# Patient Record
Sex: Male | Born: 1935
Health system: Southern US, Community
[De-identification: ages and names within clinical notes are randomized; demographics above are authoritative.]

## PROBLEM LIST (undated history)

## (undated) DIAGNOSIS — M810 Age-related osteoporosis without current pathological fracture: Secondary | ICD-10-CM

## (undated) DIAGNOSIS — E785 Hyperlipidemia, unspecified: Secondary | ICD-10-CM

## (undated) DIAGNOSIS — I10 Essential (primary) hypertension: Secondary | ICD-10-CM

## (undated) DIAGNOSIS — F0280 Dementia in other diseases classified elsewhere without behavioral disturbance: Secondary | ICD-10-CM

## (undated) DIAGNOSIS — Z860100 Personal history of colon polyps, unspecified: Secondary | ICD-10-CM

## (undated) DIAGNOSIS — E669 Obesity, unspecified: Secondary | ICD-10-CM

## (undated) DIAGNOSIS — M069 Rheumatoid arthritis, unspecified: Secondary | ICD-10-CM

## (undated) DIAGNOSIS — I429 Cardiomyopathy, unspecified: Secondary | ICD-10-CM

## (undated) DIAGNOSIS — G4730 Sleep apnea, unspecified: Secondary | ICD-10-CM

## (undated) DIAGNOSIS — J449 Chronic obstructive pulmonary disease, unspecified: Secondary | ICD-10-CM

## (undated) DIAGNOSIS — G309 Alzheimer's disease, unspecified: Secondary | ICD-10-CM

## (undated) DIAGNOSIS — M1990 Unspecified osteoarthritis, unspecified site: Secondary | ICD-10-CM

## (undated) DIAGNOSIS — C61 Malignant neoplasm of prostate: Secondary | ICD-10-CM

## (undated) DIAGNOSIS — M353 Polymyalgia rheumatica: Secondary | ICD-10-CM

## (undated) DIAGNOSIS — K219 Gastro-esophageal reflux disease without esophagitis: Secondary | ICD-10-CM

## (undated) DIAGNOSIS — G219 Secondary parkinsonism, unspecified: Secondary | ICD-10-CM

## (undated) DIAGNOSIS — I209 Angina pectoris, unspecified: Secondary | ICD-10-CM

## (undated) HISTORY — DX: Cardiomyopathy, unspecified: I42.9

## (undated) HISTORY — PX: CORONARY ANGIOPLASTY: SHX604

## (undated) HISTORY — DX: Unspecified osteoarthritis, unspecified site: M19.90

## (undated) HISTORY — DX: Personal history of colon polyps, unspecified: Z86.0100

## (undated) HISTORY — PX: HEMORRHOIDECTOMY WITH HEMORRHOID BANDING: SHX5633

## (undated) HISTORY — DX: Gastro-esophageal reflux disease without esophagitis: K21.9

## (undated) HISTORY — DX: Angina pectoris, unspecified: I20.9

## (undated) HISTORY — DX: Dementia in other diseases classified elsewhere without behavioral disturbance: F02.80

## (undated) HISTORY — PX: STRABISMUS SURGERY: SHX218

## (undated) HISTORY — DX: Hyperlipidemia, unspecified: E78.5

## (undated) HISTORY — DX: Polymyalgia rheumatica: M35.3

## (undated) HISTORY — DX: Personal history of colonic polyps: Z86.010

## (undated) HISTORY — PX: CATARACT EXTRACTION: SUR2

## (undated) HISTORY — DX: Sleep apnea, unspecified: G47.30

## (undated) HISTORY — DX: Malignant neoplasm of prostate: C61

## (undated) HISTORY — DX: Essential (primary) hypertension: I10

## (undated) HISTORY — PX: TONSILLECTOMY: SUR1361

## (undated) HISTORY — PX: PROSTATECTOMY: SHX69

## (undated) HISTORY — DX: Age-related osteoporosis without current pathological fracture: M81.0

## (undated) HISTORY — DX: Alzheimer's disease, unspecified: G30.9

## (undated) HISTORY — DX: Chronic obstructive pulmonary disease, unspecified: J44.9

## (undated) HISTORY — DX: Rheumatoid arthritis, unspecified: M06.9

## (undated) HISTORY — DX: Obesity, unspecified: E66.9

---
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
## 2004-11-28 ENCOUNTER — Inpatient Hospital Stay: Payer: Self-pay | Admitting: Anesthesiology

## 2004-11-28 ENCOUNTER — Other Ambulatory Visit: Payer: Self-pay

## 2004-11-29 ENCOUNTER — Other Ambulatory Visit: Payer: Self-pay

## 2004-12-19 ENCOUNTER — Encounter: Payer: Self-pay | Admitting: Internal Medicine

## 2005-01-01 ENCOUNTER — Ambulatory Visit: Payer: Self-pay

## 2005-01-13 ENCOUNTER — Encounter: Payer: Self-pay | Admitting: Internal Medicine

## 2005-10-06 ENCOUNTER — Emergency Department (HOSPITAL_COMMUNITY): Admission: EM | Admit: 2005-10-06 | Discharge: 2005-10-06 | Payer: Self-pay | Admitting: *Deleted

## 2006-12-13 ENCOUNTER — Ambulatory Visit: Payer: Self-pay | Admitting: Unknown Physician Specialty

## 2012-12-12 ENCOUNTER — Ambulatory Visit: Payer: Self-pay | Admitting: Specialist

## 2014-01-22 DIAGNOSIS — I209 Angina pectoris, unspecified: Secondary | ICD-10-CM | POA: Insufficient documentation

## 2014-01-22 DIAGNOSIS — K219 Gastro-esophageal reflux disease without esophagitis: Secondary | ICD-10-CM | POA: Insufficient documentation

## 2014-03-21 ENCOUNTER — Observation Stay: Payer: Self-pay | Admitting: Internal Medicine

## 2014-03-21 LAB — CK TOTAL AND CKMB (NOT AT ARMC)
CK, Total: 180 U/L
CK-MB: 2.7 ng/mL (ref 0.5–3.6)

## 2014-03-21 LAB — BASIC METABOLIC PANEL
Anion Gap: 5 — ABNORMAL LOW (ref 7–16)
BUN: 21 mg/dL — ABNORMAL HIGH (ref 7–18)
Calcium, Total: 8.3 mg/dL — ABNORMAL LOW (ref 8.5–10.1)
Chloride: 107 mmol/L (ref 98–107)
Co2: 28 mmol/L (ref 21–32)
Creatinine: 1.56 mg/dL — ABNORMAL HIGH (ref 0.60–1.30)
EGFR (African American): 47 — ABNORMAL LOW
EGFR (Non-African Amer.): 40 — ABNORMAL LOW
Glucose: 99 mg/dL (ref 65–99)
Osmolality: 282 (ref 275–301)
Potassium: 3.8 mmol/L (ref 3.5–5.1)
Sodium: 140 mmol/L (ref 136–145)

## 2014-03-21 LAB — CBC
HCT: 32.5 % — ABNORMAL LOW (ref 40.0–52.0)
HGB: 10.6 g/dL — ABNORMAL LOW (ref 13.0–18.0)
MCH: 30 pg (ref 26.0–34.0)
MCHC: 32.6 g/dL (ref 32.0–36.0)
MCV: 92 fL (ref 80–100)
Platelet: 176 10*3/uL (ref 150–440)
RBC: 3.53 10*6/uL — ABNORMAL LOW (ref 4.40–5.90)
RDW: 13.6 % (ref 11.5–14.5)
WBC: 7.5 10*3/uL (ref 3.8–10.6)

## 2014-03-21 LAB — TROPONIN I: Troponin-I: <0.02 ng/mL

## 2014-03-22 LAB — CBC WITH DIFFERENTIAL/PLATELET
Basophil #: 0.1 10*3/uL (ref 0.0–0.1)
Basophil %: 1 %
Eosinophil #: 0.2 10*3/uL (ref 0.0–0.7)
Eosinophil %: 3.5 %
HCT: 31 % — ABNORMAL LOW (ref 40.0–52.0)
HGB: 10 g/dL — ABNORMAL LOW (ref 13.0–18.0)
Lymphocyte #: 1.4 10*3/uL (ref 1.0–3.6)
Lymphocyte %: 21.1 %
MCH: 29.8 pg (ref 26.0–34.0)
MCHC: 32.2 g/dL (ref 32.0–36.0)
MCV: 92 fL (ref 80–100)
Monocyte #: 0.5 x10 3/mm (ref 0.2–1.0)
Monocyte %: 7.8 %
Neutrophil #: 4.4 10*3/uL (ref 1.4–6.5)
Neutrophil %: 66.6 %
Platelet: 152 10*3/uL (ref 150–440)
RBC: 3.36 10*6/uL — ABNORMAL LOW (ref 4.40–5.90)
RDW: 13.7 % (ref 11.5–14.5)
WBC: 6.6 10*3/uL (ref 3.8–10.6)

## 2014-03-22 LAB — BASIC METABOLIC PANEL
Anion Gap: 5 — ABNORMAL LOW (ref 7–16)
BUN: 24 mg/dL — ABNORMAL HIGH (ref 7–18)
Calcium, Total: 7.9 mg/dL — ABNORMAL LOW (ref 8.5–10.1)
Chloride: 109 mmol/L — ABNORMAL HIGH (ref 98–107)
Co2: 29 mmol/L (ref 21–32)
Creatinine: 1.67 mg/dL — ABNORMAL HIGH (ref 0.60–1.30)
EGFR (African American): 43 — ABNORMAL LOW
EGFR (Non-African Amer.): 37 — ABNORMAL LOW
Glucose: 87 mg/dL (ref 65–99)
Osmolality: 288 (ref 275–301)
Potassium: 3.8 mmol/L (ref 3.5–5.1)
Sodium: 143 mmol/L (ref 136–145)

## 2014-03-22 LAB — LIPID PANEL
Cholesterol: 114 mg/dL (ref 0–200)
HDL Cholesterol: 38 mg/dL — ABNORMAL LOW (ref 40–60)
Ldl Cholesterol, Calc: 61 mg/dL (ref 0–100)
Triglycerides: 76 mg/dL (ref 0–200)
VLDL Cholesterol, Calc: 15 mg/dL (ref 5–40)

## 2014-03-22 LAB — CK TOTAL AND CKMB (NOT AT ARMC)
CK, Total: 134 U/L
CK, Total: 163 U/L
CK-MB: 2.2 ng/mL (ref 0.5–3.6)
CK-MB: 2.8 ng/mL (ref 0.5–3.6)

## 2014-03-22 LAB — TROPONIN I
Troponin-I: <0.02 ng/mL
Troponin-I: <0.02 ng/mL

## 2014-03-22 LAB — MAGNESIUM: Magnesium: 2.1 mg/dL

## 2014-03-22 LAB — TSH: Thyroid Stimulating Horm: 4.69 u[IU]/mL — ABNORMAL HIGH

## 2014-04-09 ENCOUNTER — Emergency Department: Payer: Self-pay | Admitting: Emergency Medicine

## 2014-04-09 LAB — CBC
HCT: 36.2 % — ABNORMAL LOW (ref 40.0–52.0)
HGB: 12 g/dL — ABNORMAL LOW (ref 13.0–18.0)
MCH: 30.1 pg (ref 26.0–34.0)
MCHC: 33.1 g/dL (ref 32.0–36.0)
MCV: 91 fL (ref 80–100)
Platelet: 207 10*3/uL (ref 150–440)
RBC: 3.98 10*6/uL — ABNORMAL LOW (ref 4.40–5.90)
RDW: 13.5 % (ref 11.5–14.5)
WBC: 12.4 10*3/uL — ABNORMAL HIGH (ref 3.8–10.6)

## 2014-04-09 LAB — COMPREHENSIVE METABOLIC PANEL
Albumin: 3.5 g/dL (ref 3.4–5.0)
Alkaline Phosphatase: 62 U/L
Anion Gap: 6 — ABNORMAL LOW (ref 7–16)
BUN: 30 mg/dL — ABNORMAL HIGH (ref 7–18)
Bilirubin,Total: 0.3 mg/dL (ref 0.2–1.0)
Calcium, Total: 8.6 mg/dL (ref 8.5–10.1)
Chloride: 112 mmol/L — ABNORMAL HIGH (ref 98–107)
Co2: 27 mmol/L (ref 21–32)
Creatinine: 1.61 mg/dL — ABNORMAL HIGH (ref 0.60–1.30)
EGFR (African American): 45 — ABNORMAL LOW
EGFR (Non-African Amer.): 39 — ABNORMAL LOW
Glucose: 117 mg/dL — ABNORMAL HIGH (ref 65–99)
Osmolality: 296 (ref 275–301)
Potassium: 4.2 mmol/L (ref 3.5–5.1)
SGOT(AST): 34 U/L (ref 15–37)
SGPT (ALT): 35 U/L (ref 12–78)
Sodium: 145 mmol/L (ref 136–145)
Total Protein: 6.8 g/dL (ref 6.4–8.2)

## 2014-04-09 LAB — TROPONIN I: Troponin-I: <0.02 ng/mL

## 2015-02-05 NOTE — Discharge Summary (Signed)
PATIENT NAME:  Devin Vance, Devin Vance MR#:  [id number] DATE OF BIRTH:  03-Apr-1928  DATE OF ADMISSION:  03/21/2014 DATE OF DISCHARGE:  03/22/2014  DISCHARGE DIAGNOSES: 1.  Chest pain, unlikely cardiac with negative cardiac enzymes are negative Myoview, likely stress related. Can also be due to gastroesophageal reflux disease.  2.  Arrhythmia (ventricular bigeminy). Reduce her dose of Toprol, per cardiology.   SECONDARY DIAGNOSES: 1.  Coronary artery disease, status post stent.  2.  Chronic pedal edema.  3.  Hyperlipidemia.  4.  Hypertension.  5.  Osteoporosis.    CONSULTATION: Cardiology, Dr. Serafina Royals.   PROCEDURES AND RADIOLOGY: Myoview on June 8 was negative.   Chest x-ray on admission was negative for any acute disease.   HISTORY AND SHORT HOSPITAL COURSE: The patient is an 81 year old male with the above-mentioned medical problems, who was admitted for chest pain. Please see Dr. Serita Grit dictated history and physical for further details. The patient was ruled out with 3 negative sets of troponin and underwent Myoview which was negative. Cardiology consultation was obtained with Dr. Nehemiah Massed, who recommended discharge home with outpatient follow-up with the patient's family doctor and cardiologist. The patient was also found to have abnormal TSH, was borderline elevated, although he did not have any significant symptoms. He was requested to have a repeat TSH in a couple of weeks as an outpatient with his family doctor, and/or follow up with endocrine physician. On the date of discharge, he was feeling much better, was agreeable to discharge planning. On the date of discharge, his vital signs were as follows: Temperature 97.5, heart rate 60 per minute, respirations 18 per minute, blood pressure 123/51 mmHg. He was saturating 98% on room air.   PERTINENT PHYSICAL EXAMINATION ON THE DATE OF DISCHARGE:  CARDIOVASCULAR: S1, S2 normal. No murmurs, rubs or gallop.  LUNGS: Clear to auscultation  bilaterally. No wheezing, rales, rhonchi, or crepitation.  ABDOMEN: Soft, benign.  NEUROLOGIC: Nonfocal examination.  All other physical examination remained at baseline.   DISCHARGE MEDICATIONS: 1.  Crestor 40 mg p.o. daily.  2.  Cosamin 1 capsule p.o. daily.  3.  Vitamin D3 1000 international units once daily.  4.  Ramipril 5 mg p.o. daily.  5.  Isosorbide mononitrate 30 mg p.o. daily as needed.  6.  Lasix 20 mg p.o. daily as needed.  7.  Triazolam 0.25 mg p.o. daily. 8.  Omeprazole 20 mg p.o. daily.  9.  Lutein 20 mg p.o. daily.  10.  Fish oil 1000 mg p.o. daily.  11.  Aspirin 81 mg p.o. daily.  12.  B complex once daily.  13.  Multivitamin once daily.  14.  Caltrate with vitamin D 1 tablet p.o. daily.  15.  2 liters oxygen via nasal cannula at bedtime.  16.  Metoprolol ER 25 mg p.o. daily.    DISCHARGE DIET: Regular.   DISCHARGE ACTIVITY: As tolerated.   DISCHARGE INSTRUCTIONS AND FOLLOW-UP: The patient was instructed to follow up with Dr. Serafina Royals in 1 to 2 weeks. She will need follow-up with Dr. Calla Kicks in 2 to 4 weeks. She was requested to have her TSH checked when following up with Dr. Calla Kicks. Consider follow up with endocrine physician if her function remains abnormal. There was no restriction to resume physical therapy, considering this admission to the hospital.   [place] DISCHARGING THIS PATIENT: 45 minutes.     ____________________________ Devin Vance. Manuella Ghazi, MD vss:cg D: 03/22/2014 21:31:46 ET T: 03/23/2014 02:49:07 ET JOB#: [id number]  cc: Zaviyar Rahal S. Manuella Ghazi, MD, <Dictator> Dory Horn. Eliberto Ivory, MD Corey Skains, MD Devin Vance [facility] MD ELECTRONICALLY SIGNED 03/24/2014 20:09

## 2015-02-05 NOTE — Consult Note (Signed)
PATIENT NAME:  Devin Vance, Devin Vance MR#:  [id number] DATE OF BIRTH:  1927-12-22  DATE OF CONSULTATION:  03/22/2014  REFERRING PHYSICIAN:   CONSULTING PHYSICIAN:  Corey Skains, MD  REASON FOR CONSULTATION: Chest discomfort, coronary artery disease, gastroesophageal reflux with bradycardia and DVT.   CHIEF COMPLAINT: "I'm short of breath."   HISTORY OF PRESENT ILLNESS: This is an 81 year old male with known cardiovascular disease in the past with episodes of bradycardia, weakness and fatigue. The patient has had issues with some chest pain and shortness of breath with and without physical activity, waxing and waning but more atypical at this time with no apparent evidence of myocardial infarction at this time. EKG shows ventricular bigeminy, otherwise no evidence of acute myocardial infarction. He does have gastroesophageal reflux, on appropriate medications, possibly contributing to current symptoms.  The patient also has coronary artery disease but no evidence of myocardial infarction in the past.   REVIEW OF SYSTEMS: The remainder of review of systems is negative for vision change, ringing in the ears, hearing loss, cough, congestion, heartburn, nausea, vomiting, diarrhea, bloody stools, stomach pain, extremity pain, leg weakness, cramping of the buttocks, known blood clots, headaches, blackouts, dizzy spells, nosebleeds, congestion, trouble swallowing, frequent urination, urination at night, muscle weakness, numbness, anxiety, depression, skin lesions or skin rashes.   PAST MEDICAL HISTORY: 1.  Gastroesophageal reflux.  2.  Premature ventricular contractions.   3.  Coronary artery disease.   FAMILY HISTORY: A few family members with early onset of cardiovascular disease.   SOCIAL HISTORY: Currently denies alcohol or tobacco use..   ALLERGIES: AS LISTED.   MEDICATIONS: As listed.   PHYSICAL EXAMINATION: VITAL SIGNS: Blood pressure is 136/68 bilaterally. Heart rate is 50 upright, reclining  and elevated consistent with preventricular contractions and ventricular bigeminy.  HEAD, EYES, EARS, NOSE AND THROAT: No icterus, thyromegaly, ulcers, hemorrhage or xanthelasma.  CARDIOVASCULAR: Irregularly irregular with normal S1 and S2. No apparent murmur, gallop or rub. PMI is diffuse. Carotid upstroke normal without bruit. Jugular venous pressure is normal.  LUNGS: Clear to auscultation with normal respirations.  ABDOMEN: Soft, nontender, without hepatosplenomegaly or mass. Abdominal aorta is normal size without bruit.  EXTREMITIES: Show 2+ radial, femoral, dorsal pedal pulses with no lower extremity edema, cyanosis, clubbing or ulcers.  NEUROLOGIC: He is oriented to time, place and person, with normal mood and affect.   ASSESSMENT: An 81 year old male with coronary artery disease, gastroesophageal reflux, bradycardia likely secondary to ventricular bigeminy with no current evidence of myocardial infarction or congestive heart failure.   RECOMMENDATIONS: 1.  Decrease beta blocker due to bradycardia and ventricular bigeminy.  2.  Consider echocardiogram for LV systolic dysfunction.  3.  Serial ECG and enzymes to assess myocardial infarction and consider treadmill EKG and/or stress test for further evaluation of chest pain.  4.  Further diagnostic testing and treatment options after above.    ____________________________ Corey Skains, MD bjk:cs D: 03/22/2014 19:08:00 ET T: 03/22/2014 20:52:43 ET JOB#: [id number]  cc: Corey Skains, MD, <Dictator> Corey Skains MD ELECTRONICALLY SIGNED 03/23/2014 14:45

## 2015-02-05 NOTE — H&P (Signed)
PATIENT NAME:  Devin Vance, Devin Vance MR#:  [id number] DATE OF BIRTH:  11/27/1927  DATE OF ADMISSION:  03/21/2014  PRIMARY CARE PROVIDER: Dr. Calla Kicks.   REFERRING DOCTOR: Dr. Jacqualine Code.   CHIEF COMPLAINT: Chest pain.   HISTORY OF PRESENT ILLNESS: The patient is an 81 year old white male who has a history of coronary artery disease with a stent placed about 10 years ago, presents with complaint of left-sided chest pain. He states that he took a nap earlier and then woke up and was reading the newspaper when he started having sharp left-sided chest pain. The patient reports that previous episode of this has been a long time ago. He is under a lot of stress due to a move. He did not have any shortness of breath. He reports that he had cramping of both of his hands 2 days ago. He otherwise denies any chest pain on exertion. No dyspnea on exertion. He has chronic lower extremity swelling. Denies any fevers, chills, nausea, vomiting or diarrhea.   PAST MEDICAL HISTORY: Significant for:  1. Coronary artery disease with a single stent.  2. Chronic pedal edema.  3. Hyperlipidemia.  4. Hypertension.  5. Osteoporosis.   ALLERGIES: None.   MEDICATIONS: He is on Crestor 40 daily, ramipril 5 daily, isosorbide mononitrate 30 daily, Lasix 20 daily, Toprol-XL 50 daily, lutein 20 mg daily, fish oil 1000 mg daily, aspirin 81 one tab p.o. daily, B complex 1 tab p.o. daily, multivitamin daily, calcium plus vitamin D 1 tab p.o. daily, Halcion 0.25 daily, omeprazole 20 daily.   SOCIAL HISTORY: Does not smoke. Does not drink. No drugs.   FAMILY HISTORY: Positive for hypertension.   REVIEW OF SYSTEMS:   CONSTITUTIONAL: Denies any fevers, fatigue, weakness. Complains of chest pain. No weight loss. No weight gain.  EYES: No blurred or double vision. No pain. No redness. No inflammation. No glaucoma. No cataracts.  ENT: No tinnitus. No ear pain. No hearing loss. No seasonal or year-round allergies. No difficulty swallowing.   RESPIRATORY: Denies any cough, wheezing, hemoptysis.  CARDIOVASCULAR: Complains of left-sided chest pain. No orthopnea. Has chronic edema.  GASTROINTESTINAL: No nausea, vomiting, diarrhea. No abdominal pain. No hematemesis. No melena.  GENITOURINARY: Denies any dysuria, hematuria, renal calculus or frequency.  ENDOCRINE: Denies any polyuria, nocturia, thyroid problems.  HEMATOLOGIC AND LYMPHATIC: Denies anemia, easy bruisability or bleeding.  SKIN: No acne. No rash.  MUSCULOSKELETAL: Denies any pain in the neck, back or shoulder.  NEUROLOGIC: No numbness, CVA, TIA.   PSYCHIATRIC: No anxiety, insomnia or ADD.   PHYSICAL EXAMINATION:  VITAL SIGNS: Temperature 98.2, pulse 44, respirations 18, blood pressure 142/62, O2 is 94%.  GENERAL: The patient is a well-developed, well-nourished male in no acute distress.  HEENT: Head atraumatic, normocephalic. Pupils equally round, reactive to light and accommodation. There is no conjunctival pallor. No scleral icterus. Nasal exam shows no drainage or ulceration. Oropharynx is clear without any exudate.  NECK: Supple without any JVD.  CARDIOVASCULAR: Regular rate and rhythm. No rubs or clicks. He has a systolic murmur heard at the right sternal border. S1, S2 positive. No gallops.  LUNGS: Clear to auscultation bilaterally without any rales, rhonchi or wheezing.  ABDOMEN: Soft, nontender, nondistended. Positive bowel sounds x 4. Edema 1+.  SKIN: No rash.  LYMPHATICS: No lymph nodes palpable.  VASCULAR: Good DP, PT pulses.  PSYCHIATRIC: Not anxious or depressed.  NEUROLOGIC: Awake, alert, oriented x 3. No focal deficits.   EKG shows bigeminy. No ST-T wave changes.  LABS: Glucose 99, BUN 21, creatinine 1.56, sodium 140, potassium was 3.8, chloride 107, CO2 28, calcium 8.3. Troponin less than 0.02. WBC 7.5, hemoglobin 10.6, platelet count 176. Chest x-ray shows no acute cardiopulmonary processes.   ASSESSMENT AND PLAN: The patient is an 81 year old  white male with history of coronary artery disease, hypertension, hyperlipidemia, presents with chest pain.  1. Chest pain: Will place him under observation. Do serial enzymes. Continue aspirin and metoprolol. Cardiology evaluation  in the morning.  2. Hypertension: Continue Toprol, isosorbide mononitrate and ramipril.  3. Gastroesophageal reflux disease: Will continue omeprazole.  4. Hyperlipidemia: Will continue Crestor. Check a fasting lipid panel in the a.m.    TIME SPENT ON THIS PATIENT: 45 minutes.   ____________________________ Lafonda Mosses Posey Pronto, MD shp:gb D: 03/21/2014 21:37:35 ET T: 03/21/2014 23:54:25 ET JOB#: [id number]  cc: Tiani Stanbery H. Posey Pronto, MD, <Dictator> Alric Seton MD ELECTRONICALLY SIGNED 03/30/2014 16:51

## 2015-05-18 DIAGNOSIS — H5032 Intermittent alternating esotropia: Secondary | ICD-10-CM | POA: Insufficient documentation

## 2015-10-26 DIAGNOSIS — M40202 Unspecified kyphosis, cervical region: Secondary | ICD-10-CM | POA: Insufficient documentation

## 2015-10-27 ENCOUNTER — Other Ambulatory Visit: Payer: Self-pay | Admitting: Family Medicine

## 2015-11-30 ENCOUNTER — Other Ambulatory Visit: Payer: Self-pay | Admitting: Family Medicine

## 2015-11-30 DIAGNOSIS — M4850XA Collapsed vertebra, not elsewhere classified, site unspecified, initial encounter for fracture: Secondary | ICD-10-CM

## 2015-12-15 ENCOUNTER — Ambulatory Visit
Admission: RE | Admit: 2015-12-15 | Discharge: 2015-12-15 | Disposition: A | Discharge status: Home / Self Care | Payer: Medicare Other | Source: Ambulatory Visit | Attending: Family Medicine | Admitting: Family Medicine

## 2015-12-15 DIAGNOSIS — M4850XA Collapsed vertebra, not elsewhere classified, site unspecified, initial encounter for fracture: Secondary | ICD-10-CM

## 2015-12-15 DIAGNOSIS — Z13820 Encounter for screening for osteoporosis: Secondary | ICD-10-CM | POA: Insufficient documentation

## 2016-10-30 DIAGNOSIS — Z9989 Dependence on other enabling machines and devices: Secondary | ICD-10-CM | POA: Diagnosis not present

## 2016-10-30 DIAGNOSIS — G4733 Obstructive sleep apnea (adult) (pediatric): Secondary | ICD-10-CM | POA: Diagnosis not present

## 2016-11-08 ENCOUNTER — Ambulatory Visit: Payer: Medicare Other | Attending: Specialist

## 2016-11-08 DIAGNOSIS — G4733 Obstructive sleep apnea (adult) (pediatric): Secondary | ICD-10-CM | POA: Diagnosis not present

## 2016-11-12 DIAGNOSIS — R0602 Shortness of breath: Secondary | ICD-10-CM | POA: Diagnosis not present

## 2016-11-12 DIAGNOSIS — G4733 Obstructive sleep apnea (adult) (pediatric): Secondary | ICD-10-CM | POA: Diagnosis not present

## 2016-11-12 DIAGNOSIS — E669 Obesity, unspecified: Secondary | ICD-10-CM | POA: Diagnosis not present

## 2016-11-12 DIAGNOSIS — I429 Cardiomyopathy, unspecified: Secondary | ICD-10-CM | POA: Diagnosis not present

## 2016-11-12 DIAGNOSIS — I209 Angina pectoris, unspecified: Secondary | ICD-10-CM | POA: Diagnosis not present

## 2016-11-12 DIAGNOSIS — K219 Gastro-esophageal reflux disease without esophagitis: Secondary | ICD-10-CM | POA: Diagnosis not present

## 2016-11-12 DIAGNOSIS — I10 Essential (primary) hypertension: Secondary | ICD-10-CM | POA: Diagnosis not present

## 2016-11-12 DIAGNOSIS — I2510 Atherosclerotic heart disease of native coronary artery without angina pectoris: Secondary | ICD-10-CM | POA: Diagnosis not present

## 2016-11-21 ENCOUNTER — Ambulatory Visit: Payer: Medicare Other | Attending: Specialist

## 2016-11-21 DIAGNOSIS — G4733 Obstructive sleep apnea (adult) (pediatric): Secondary | ICD-10-CM | POA: Insufficient documentation

## 2016-11-21 DIAGNOSIS — G4761 Periodic limb movement disorder: Secondary | ICD-10-CM | POA: Diagnosis not present

## 2016-11-27 DIAGNOSIS — H5032 Intermittent alternating esotropia: Secondary | ICD-10-CM | POA: Diagnosis not present

## 2017-01-25 DIAGNOSIS — H4321 Crystalline deposits in vitreous body, right eye: Secondary | ICD-10-CM | POA: Diagnosis not present

## 2017-01-25 DIAGNOSIS — H5032 Intermittent alternating esotropia: Secondary | ICD-10-CM | POA: Diagnosis not present

## 2017-01-25 DIAGNOSIS — H532 Diplopia: Secondary | ICD-10-CM | POA: Diagnosis not present

## 2017-02-12 DIAGNOSIS — I10 Essential (primary) hypertension: Secondary | ICD-10-CM | POA: Diagnosis not present

## 2017-02-12 DIAGNOSIS — M7021 Olecranon bursitis, right elbow: Secondary | ICD-10-CM | POA: Diagnosis not present

## 2017-02-12 DIAGNOSIS — N528 Other male erectile dysfunction: Secondary | ICD-10-CM | POA: Diagnosis not present

## 2017-02-12 DIAGNOSIS — D649 Anemia, unspecified: Secondary | ICD-10-CM | POA: Diagnosis not present

## 2017-03-30 DIAGNOSIS — W57XXXA Bitten or stung by nonvenomous insect and other nonvenomous arthropods, initial encounter: Secondary | ICD-10-CM | POA: Diagnosis not present

## 2017-03-30 DIAGNOSIS — R040 Epistaxis: Secondary | ICD-10-CM | POA: Diagnosis not present

## 2017-04-29 DIAGNOSIS — I2510 Atherosclerotic heart disease of native coronary artery without angina pectoris: Secondary | ICD-10-CM | POA: Diagnosis not present

## 2017-04-29 DIAGNOSIS — E669 Obesity, unspecified: Secondary | ICD-10-CM | POA: Diagnosis not present

## 2017-04-29 DIAGNOSIS — G4733 Obstructive sleep apnea (adult) (pediatric): Secondary | ICD-10-CM | POA: Diagnosis not present

## 2017-04-29 DIAGNOSIS — N400 Enlarged prostate without lower urinary tract symptoms: Secondary | ICD-10-CM | POA: Diagnosis not present

## 2017-04-29 DIAGNOSIS — Z955 Presence of coronary angioplasty implant and graft: Secondary | ICD-10-CM | POA: Diagnosis not present

## 2017-04-29 DIAGNOSIS — I429 Cardiomyopathy, unspecified: Secondary | ICD-10-CM | POA: Diagnosis not present

## 2017-04-29 DIAGNOSIS — I209 Angina pectoris, unspecified: Secondary | ICD-10-CM | POA: Diagnosis not present

## 2017-04-29 DIAGNOSIS — I10 Essential (primary) hypertension: Secondary | ICD-10-CM | POA: Diagnosis not present

## 2017-04-29 DIAGNOSIS — R0602 Shortness of breath: Secondary | ICD-10-CM | POA: Diagnosis not present

## 2017-04-29 DIAGNOSIS — K219 Gastro-esophageal reflux disease without esophagitis: Secondary | ICD-10-CM | POA: Diagnosis not present

## 2017-06-06 DIAGNOSIS — R413 Other amnesia: Secondary | ICD-10-CM | POA: Diagnosis not present

## 2017-06-20 DIAGNOSIS — Z85828 Personal history of other malignant neoplasm of skin: Secondary | ICD-10-CM | POA: Diagnosis not present

## 2017-06-20 DIAGNOSIS — L821 Other seborrheic keratosis: Secondary | ICD-10-CM | POA: Diagnosis not present

## 2017-06-20 DIAGNOSIS — L218 Other seborrheic dermatitis: Secondary | ICD-10-CM | POA: Diagnosis not present

## 2017-06-20 DIAGNOSIS — D225 Melanocytic nevi of trunk: Secondary | ICD-10-CM | POA: Diagnosis not present

## 2017-07-17 DIAGNOSIS — Z9989 Dependence on other enabling machines and devices: Secondary | ICD-10-CM | POA: Diagnosis not present

## 2017-07-17 DIAGNOSIS — R498 Other voice and resonance disorders: Secondary | ICD-10-CM | POA: Diagnosis not present

## 2017-07-17 DIAGNOSIS — F0390 Unspecified dementia without behavioral disturbance: Secondary | ICD-10-CM | POA: Diagnosis not present

## 2017-07-17 DIAGNOSIS — G4733 Obstructive sleep apnea (adult) (pediatric): Secondary | ICD-10-CM | POA: Diagnosis not present

## 2017-07-17 DIAGNOSIS — Z23 Encounter for immunization: Secondary | ICD-10-CM | POA: Diagnosis not present

## 2017-08-06 DIAGNOSIS — N528 Other male erectile dysfunction: Secondary | ICD-10-CM | POA: Diagnosis not present

## 2017-08-06 DIAGNOSIS — B070 Plantar wart: Secondary | ICD-10-CM | POA: Diagnosis not present

## 2017-08-06 DIAGNOSIS — Z23 Encounter for immunization: Secondary | ICD-10-CM | POA: Diagnosis not present

## 2017-08-06 DIAGNOSIS — R351 Nocturia: Secondary | ICD-10-CM | POA: Diagnosis not present

## 2017-08-06 DIAGNOSIS — E7800 Pure hypercholesterolemia, unspecified: Secondary | ICD-10-CM | POA: Diagnosis not present

## 2017-08-06 DIAGNOSIS — I10 Essential (primary) hypertension: Secondary | ICD-10-CM | POA: Diagnosis not present

## 2017-08-06 DIAGNOSIS — R413 Other amnesia: Secondary | ICD-10-CM | POA: Diagnosis not present

## 2017-08-06 DIAGNOSIS — R7303 Prediabetes: Secondary | ICD-10-CM | POA: Diagnosis not present

## 2017-08-15 ENCOUNTER — Other Ambulatory Visit: Payer: Self-pay

## 2017-08-15 DIAGNOSIS — I10 Essential (primary) hypertension: Secondary | ICD-10-CM | POA: Insufficient documentation

## 2017-08-16 ENCOUNTER — Ambulatory Visit (INDEPENDENT_AMBULATORY_CARE_PROVIDER_SITE_OTHER): Payer: Medicare Other | Admitting: Podiatry

## 2017-08-16 DIAGNOSIS — L989 Disorder of the skin and subcutaneous tissue, unspecified: Secondary | ICD-10-CM | POA: Diagnosis not present

## 2017-08-19 NOTE — Progress Notes (Signed)
   Subjective: Patient presents to the office today for chief complaint of a painful callus lesion of the plantar aspect of the right foot that has been present for the past 3-4 months. Patient states that the pain is ongoing and is affecting their ability to ambulate without pain. He states he thinks he has a plantar wart. Walking increases the pain. He has been using corn pads for treatment. Patient presents today for further treatment and evaluation.   No past medical history on file.   Objective:  Physical Exam General: Alert and oriented x3 in no acute distress  Dermatology: Hyperkeratotic lesion present on the right foot. Pain on palpation with a central nucleated core noted.  Skin is warm, dry and supple bilateral lower extremities. Negative for open lesions or macerations.  Vascular: Palpable pedal pulses bilaterally. No edema or erythema noted. Capillary refill within normal limits.  Neurological: Epicritic and protective threshold grossly intact bilaterally.   Musculoskeletal Exam: Pain on palpation at the keratotic lesion noted. Range of motion within normal limits bilateral. Muscle strength 5/5 in all groups bilateral.  Assessment: #1 Porokeratosis right foot   Plan of Care:  #1 Patient evaluated #2 Excisional debridement of keratoic lesion using a chisel blade was performed without incident.  #3 Dressed area with light dressing. #4 Recommended Urea 40% cream daily. #5 Return to clinic when necessary.  Edrick Kins, DPM [facility]  Dr. Edrick Kins, [address]                                        [place], [place] [address]                Office [phone]  Fax [phone]

## 2017-08-31 DIAGNOSIS — Z760 Encounter for issue of repeat prescription: Secondary | ICD-10-CM | POA: Diagnosis not present

## 2017-08-31 DIAGNOSIS — M1380 Other specified arthritis, unspecified site: Secondary | ICD-10-CM | POA: Diagnosis not present

## 2017-09-03 ENCOUNTER — Other Ambulatory Visit: Payer: Self-pay | Admitting: Neurology

## 2017-09-03 DIAGNOSIS — R413 Other amnesia: Secondary | ICD-10-CM

## 2017-09-07 ENCOUNTER — Ambulatory Visit
Admission: RE | Admit: 2017-09-07 | Discharge: 2017-09-07 | Disposition: A | Discharge status: Home / Self Care | Payer: Medicare Other | Source: Ambulatory Visit | Attending: Neurology | Admitting: Neurology

## 2017-09-07 DIAGNOSIS — R413 Other amnesia: Secondary | ICD-10-CM | POA: Diagnosis not present

## 2017-09-09 DIAGNOSIS — R52 Pain, unspecified: Secondary | ICD-10-CM | POA: Diagnosis not present

## 2017-09-10 DIAGNOSIS — R498 Other voice and resonance disorders: Secondary | ICD-10-CM | POA: Diagnosis not present

## 2017-09-10 DIAGNOSIS — G4733 Obstructive sleep apnea (adult) (pediatric): Secondary | ICD-10-CM | POA: Diagnosis not present

## 2017-09-10 DIAGNOSIS — F0390 Unspecified dementia without behavioral disturbance: Secondary | ICD-10-CM | POA: Diagnosis not present

## 2017-09-10 DIAGNOSIS — E559 Vitamin D deficiency, unspecified: Secondary | ICD-10-CM | POA: Diagnosis not present

## 2017-09-10 DIAGNOSIS — Z9989 Dependence on other enabling machines and devices: Secondary | ICD-10-CM | POA: Diagnosis not present

## 2017-09-10 DIAGNOSIS — R269 Unspecified abnormalities of gait and mobility: Secondary | ICD-10-CM | POA: Diagnosis not present

## 2017-09-10 DIAGNOSIS — M353 Polymyalgia rheumatica: Secondary | ICD-10-CM | POA: Diagnosis not present

## 2017-09-19 DIAGNOSIS — Z9189 Other specified personal risk factors, not elsewhere classified: Secondary | ICD-10-CM | POA: Diagnosis not present

## 2017-09-19 DIAGNOSIS — Z23 Encounter for immunization: Secondary | ICD-10-CM | POA: Diagnosis not present

## 2017-09-19 DIAGNOSIS — M353 Polymyalgia rheumatica: Secondary | ICD-10-CM | POA: Diagnosis not present

## 2017-10-14 DIAGNOSIS — M150 Primary generalized (osteo)arthritis: Secondary | ICD-10-CM | POA: Diagnosis not present

## 2017-10-14 DIAGNOSIS — M353 Polymyalgia rheumatica: Secondary | ICD-10-CM | POA: Diagnosis not present

## 2017-10-17 ENCOUNTER — Encounter: Payer: Self-pay | Admitting: Speech Pathology

## 2017-10-17 ENCOUNTER — Ambulatory Visit: Payer: Medicare Other | Attending: Neurology | Admitting: Speech Pathology

## 2017-10-17 ENCOUNTER — Other Ambulatory Visit: Payer: Self-pay

## 2017-10-17 DIAGNOSIS — R490 Dysphonia: Secondary | ICD-10-CM | POA: Insufficient documentation

## 2017-10-17 NOTE — Therapy (Signed)
[place] [facility] SERVICES [address] [place], [place], [address] Phone: [phone]   Fax:  [phone]  Speech Language Pathology Evaluation  Patient Details  Name: Devin Vance MRN: [id number] Date of Birth: 04-26-1928 Referring Provider: Dr. Manuella Ghazi   Encounter Date: 10/17/2017  End of Session - 10/17/17 1547    Visit Number  1    Number of Visits  1    Date for SLP Re-Evaluation  10/17/17    SLP Start Time  1300    SLP Stop Time   1400    SLP Time Calculation (min)  60 min    Activity Tolerance  Patient tolerated treatment well       History reviewed. No pertinent past medical history.  History reviewed. No pertinent surgical history.  There were no vitals filed for this visit.      SLP Evaluation [facility] - 10/17/17 0001      SLP Visit Information   SLP Received On  10/17/17    Referring Provider  Dr. Manuella Ghazi    Onset Date  07/17/2017    Medical Diagnosis  hypophonia      Subjective   Subjective  Patient and wife reports soft speaking voice but no change in his ability to sing loudly and with his usual pitch range.  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    Patient/Family Stated Goal  Louder speaking voice      General Information   HPI  84 year old man with concern for parkinsonism secondary hypophonia and hunched forward posture.      Prior Functional Status   Cognitive/Linguistic Baseline  Baseline deficits    Baseline deficit details  Patient with Dx dementia      Oral Motor/Sensory Function   Overall Oral Motor/Sensory Function  Appears within functional limits for tasks assessed      Motor Speech   Overall Motor Speech  Impaired    Respiration  Within functional limits    Phonation  Low vocal intensity    Resonance  Within functional limits    Articulation  Within functional limitis    Intelligibility  Intelligible    Phonation  Impaired    Volume  Soft       Standardized Assessments   Standardized Assessments   Other Assessment LSVT-LOUD Voice Evaluation       LSVT-LOUD Voice Evaluation Maximum phonation time for sustained ah: 16 seconds Mean intensity during sustained ah: 75 dB (85 dB and clear vocal quality with cues) Mean intensity sustained during conversational speech: 66 dB ( 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  The patient exhibited values outside the norm for Shimmer and Noise to Harmonic Ratio.  Average fundamental frequency was average for age and gender.  Simulatability: Improved vocal quality with loud voice.  SLP Education - 10/17/17 1546    Education provided  Yes    Education Details  Voice Building Exercise Routine    Person(s) Educated  Patient;Spouse    Methods  Explanation;Demonstration;Verbal cues;Handout    Comprehension  Verbalized understanding;Returned demonstration           Plan - 10/17/17 1547    Clinical Impression Statement  This 84 year old man with concern for parkinsonism secondary hypophonia and hunched forward posture is presenting with moderate dysphonia characterized by soft volume and hoarse vocal quality.  The patient's wife reports that he has not had changes in his singing voice; maintaining loudness, pitch range, and vocal quality.  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  During today's session, the patient demonstrated ability to generate a loud, good quality voice for sustained vowel and reading with minimal cues.  I do not think the patient requires the intensive schedule of LSVT-LOUD.  I gave him and his wife a written exercise plan to promote loudness and awareness of loudness.    They have my card if they have any further questions or concerns regarding his voice.    Speech Therapy Frequency  One time visit    Treatment/Interventions  Patient/family education;Other (comment) Voice building    Potential to Achieve Goals  Good    Potential Considerations  Ability to learn/carryover information;Family/community support    SLP Home Exercise Plan  Voice Building Routine    Consulted and Agree with Plan of Care  Patient;Family member/caregiver    Family Member Consulted  Spouse       Patient will benefit from skilled therapeutic intervention in order to improve the following deficits and impairments:   Dysphonia - Plan: SLP plan of care cert/re-cert    Problem List Patient Active Problem List   Diagnosis Date Noted   HTN (hypertension) 08/15/2017   Kyphosis of cervical region 10/26/2015   Intermittent alternating esotropia 05/18/2015   GERD (gastroesophageal reflux disease) 01/22/2014   Angina pectoris ([place]) 01/22/2014   Leroy Sea, MS/CCC- SLP  Lou Miner 10/17/2017, 3:51 PM  [facility] [address] [place], [place], [address] Phone: [phone]   Fax:  [phone]  Name: Devin Vance MRN: [id number] Date of Birth: 14-Apr-1928

## 2017-10-21 ENCOUNTER — Encounter: Payer: Medicare Other | Admitting: Speech Pathology

## 2017-10-22 ENCOUNTER — Encounter: Payer: Medicare Other | Admitting: Speech Pathology

## 2017-10-23 ENCOUNTER — Encounter: Payer: Medicare Other | Admitting: Speech Pathology

## 2017-10-24 ENCOUNTER — Encounter: Payer: Medicare Other | Admitting: Speech Pathology

## 2017-10-28 ENCOUNTER — Encounter: Payer: Medicare Other | Admitting: Speech Pathology

## 2017-10-29 ENCOUNTER — Encounter: Payer: Medicare Other | Admitting: Speech Pathology

## 2017-10-30 ENCOUNTER — Encounter: Payer: Medicare Other | Admitting: Speech Pathology

## 2017-10-30 DIAGNOSIS — R0609 Other forms of dyspnea: Secondary | ICD-10-CM | POA: Diagnosis not present

## 2017-10-30 DIAGNOSIS — J449 Chronic obstructive pulmonary disease, unspecified: Secondary | ICD-10-CM | POA: Diagnosis not present

## 2017-10-30 DIAGNOSIS — G4733 Obstructive sleep apnea (adult) (pediatric): Secondary | ICD-10-CM | POA: Diagnosis not present

## 2017-10-31 ENCOUNTER — Encounter: Payer: Medicare Other | Admitting: Speech Pathology

## 2017-11-04 ENCOUNTER — Encounter: Payer: Medicare Other | Admitting: Speech Pathology

## 2017-11-05 ENCOUNTER — Encounter: Payer: Medicare Other | Admitting: Speech Pathology

## 2017-11-06 ENCOUNTER — Encounter: Payer: Medicare Other | Admitting: Speech Pathology

## 2017-11-07 ENCOUNTER — Encounter: Payer: Medicare Other | Admitting: Speech Pathology

## 2017-11-07 DIAGNOSIS — Z955 Presence of coronary angioplasty implant and graft: Secondary | ICD-10-CM | POA: Diagnosis not present

## 2017-11-07 DIAGNOSIS — E669 Obesity, unspecified: Secondary | ICD-10-CM | POA: Diagnosis not present

## 2017-11-07 DIAGNOSIS — R0602 Shortness of breath: Secondary | ICD-10-CM | POA: Diagnosis not present

## 2017-11-07 DIAGNOSIS — I209 Angina pectoris, unspecified: Secondary | ICD-10-CM | POA: Diagnosis not present

## 2017-11-07 DIAGNOSIS — G4733 Obstructive sleep apnea (adult) (pediatric): Secondary | ICD-10-CM | POA: Diagnosis not present

## 2017-11-07 DIAGNOSIS — K219 Gastro-esophageal reflux disease without esophagitis: Secondary | ICD-10-CM | POA: Diagnosis not present

## 2017-11-07 DIAGNOSIS — I10 Essential (primary) hypertension: Secondary | ICD-10-CM | POA: Diagnosis not present

## 2017-11-07 DIAGNOSIS — N400 Enlarged prostate without lower urinary tract symptoms: Secondary | ICD-10-CM | POA: Diagnosis not present

## 2017-11-07 DIAGNOSIS — I2510 Atherosclerotic heart disease of native coronary artery without angina pectoris: Secondary | ICD-10-CM | POA: Diagnosis not present

## 2017-11-07 DIAGNOSIS — I429 Cardiomyopathy, unspecified: Secondary | ICD-10-CM | POA: Diagnosis not present

## 2017-11-11 ENCOUNTER — Encounter: Payer: Medicare Other | Admitting: Speech Pathology

## 2017-11-12 ENCOUNTER — Encounter: Payer: Medicare Other | Admitting: Speech Pathology

## 2017-11-13 ENCOUNTER — Encounter: Payer: Medicare Other | Admitting: Speech Pathology

## 2017-11-14 ENCOUNTER — Encounter: Payer: Medicare Other | Admitting: Speech Pathology

## 2017-11-18 DIAGNOSIS — M150 Primary generalized (osteo)arthritis: Secondary | ICD-10-CM | POA: Diagnosis not present

## 2017-11-18 DIAGNOSIS — M353 Polymyalgia rheumatica: Secondary | ICD-10-CM | POA: Diagnosis not present

## 2017-12-03 DIAGNOSIS — Z9989 Dependence on other enabling machines and devices: Secondary | ICD-10-CM | POA: Diagnosis not present

## 2017-12-03 DIAGNOSIS — G4733 Obstructive sleep apnea (adult) (pediatric): Secondary | ICD-10-CM | POA: Diagnosis not present

## 2017-12-03 DIAGNOSIS — R498 Other voice and resonance disorders: Secondary | ICD-10-CM | POA: Diagnosis not present

## 2017-12-03 DIAGNOSIS — F0390 Unspecified dementia without behavioral disturbance: Secondary | ICD-10-CM | POA: Diagnosis not present

## 2017-12-17 DIAGNOSIS — M353 Polymyalgia rheumatica: Secondary | ICD-10-CM | POA: Diagnosis not present

## 2017-12-17 DIAGNOSIS — H26493 Other secondary cataract, bilateral: Secondary | ICD-10-CM | POA: Diagnosis not present

## 2017-12-17 DIAGNOSIS — Z79899 Other long term (current) drug therapy: Secondary | ICD-10-CM | POA: Diagnosis not present

## 2017-12-20 DIAGNOSIS — H26492 Other secondary cataract, left eye: Secondary | ICD-10-CM | POA: Diagnosis not present

## 2017-12-20 DIAGNOSIS — H26491 Other secondary cataract, right eye: Secondary | ICD-10-CM | POA: Diagnosis not present

## 2018-01-07 DIAGNOSIS — Z79899 Other long term (current) drug therapy: Secondary | ICD-10-CM | POA: Diagnosis not present

## 2018-01-07 DIAGNOSIS — M353 Polymyalgia rheumatica: Secondary | ICD-10-CM | POA: Diagnosis not present

## 2018-01-09 DIAGNOSIS — M150 Primary generalized (osteo)arthritis: Secondary | ICD-10-CM | POA: Diagnosis not present

## 2018-01-09 DIAGNOSIS — M353 Polymyalgia rheumatica: Secondary | ICD-10-CM | POA: Diagnosis not present

## 2018-01-09 DIAGNOSIS — M1990 Unspecified osteoarthritis, unspecified site: Secondary | ICD-10-CM | POA: Diagnosis not present

## 2018-02-03 DIAGNOSIS — M150 Primary generalized (osteo)arthritis: Secondary | ICD-10-CM | POA: Diagnosis not present

## 2018-02-03 DIAGNOSIS — M545 Low back pain: Secondary | ICD-10-CM | POA: Diagnosis not present

## 2018-02-03 DIAGNOSIS — G8929 Other chronic pain: Secondary | ICD-10-CM | POA: Diagnosis not present

## 2018-02-03 DIAGNOSIS — M1990 Unspecified osteoarthritis, unspecified site: Secondary | ICD-10-CM | POA: Diagnosis not present

## 2018-02-04 DIAGNOSIS — R413 Other amnesia: Secondary | ICD-10-CM | POA: Diagnosis not present

## 2018-02-04 DIAGNOSIS — M545 Low back pain: Secondary | ICD-10-CM | POA: Diagnosis not present

## 2018-02-04 DIAGNOSIS — I10 Essential (primary) hypertension: Secondary | ICD-10-CM | POA: Diagnosis not present

## 2018-02-04 DIAGNOSIS — E7800 Pure hypercholesterolemia, unspecified: Secondary | ICD-10-CM | POA: Diagnosis not present

## 2018-02-06 ENCOUNTER — Other Ambulatory Visit: Payer: Self-pay | Admitting: Rheumatology

## 2018-02-06 DIAGNOSIS — M545 Low back pain, unspecified: Secondary | ICD-10-CM

## 2018-02-06 DIAGNOSIS — G8929 Other chronic pain: Secondary | ICD-10-CM

## 2018-02-07 ENCOUNTER — Ambulatory Visit
Admission: RE | Admit: 2018-02-07 | Discharge: 2018-02-07 | Disposition: A | Discharge status: Home / Self Care | Payer: Medicare Other | Source: Ambulatory Visit | Attending: Rheumatology | Admitting: Rheumatology

## 2018-02-07 DIAGNOSIS — M5126 Other intervertebral disc displacement, lumbar region: Secondary | ICD-10-CM | POA: Insufficient documentation

## 2018-02-07 DIAGNOSIS — M545 Low back pain, unspecified: Secondary | ICD-10-CM

## 2018-02-07 DIAGNOSIS — G8929 Other chronic pain: Secondary | ICD-10-CM | POA: Insufficient documentation

## 2018-02-07 DIAGNOSIS — M48061 Spinal stenosis, lumbar region without neurogenic claudication: Secondary | ICD-10-CM | POA: Insufficient documentation

## 2018-02-18 DIAGNOSIS — G8929 Other chronic pain: Secondary | ICD-10-CM | POA: Diagnosis not present

## 2018-02-18 DIAGNOSIS — M545 Low back pain: Secondary | ICD-10-CM | POA: Diagnosis not present

## 2018-02-25 DIAGNOSIS — M545 Low back pain: Secondary | ICD-10-CM | POA: Diagnosis not present

## 2018-02-25 DIAGNOSIS — G8929 Other chronic pain: Secondary | ICD-10-CM | POA: Diagnosis not present

## 2018-02-28 DIAGNOSIS — R0609 Other forms of dyspnea: Secondary | ICD-10-CM | POA: Diagnosis not present

## 2018-02-28 DIAGNOSIS — M545 Low back pain: Secondary | ICD-10-CM | POA: Diagnosis not present

## 2018-02-28 DIAGNOSIS — G4733 Obstructive sleep apnea (adult) (pediatric): Secondary | ICD-10-CM | POA: Diagnosis not present

## 2018-02-28 DIAGNOSIS — G8929 Other chronic pain: Secondary | ICD-10-CM | POA: Diagnosis not present

## 2018-02-28 DIAGNOSIS — J449 Chronic obstructive pulmonary disease, unspecified: Secondary | ICD-10-CM | POA: Diagnosis not present

## 2018-03-04 DIAGNOSIS — H109 Unspecified conjunctivitis: Secondary | ICD-10-CM | POA: Diagnosis not present

## 2018-03-04 DIAGNOSIS — G8929 Other chronic pain: Secondary | ICD-10-CM | POA: Diagnosis not present

## 2018-03-04 DIAGNOSIS — M545 Low back pain: Secondary | ICD-10-CM | POA: Diagnosis not present

## 2018-03-04 DIAGNOSIS — H00015 Hordeolum externum left lower eyelid: Secondary | ICD-10-CM | POA: Diagnosis not present

## 2018-03-12 DIAGNOSIS — J449 Chronic obstructive pulmonary disease, unspecified: Secondary | ICD-10-CM | POA: Diagnosis not present

## 2018-03-13 DIAGNOSIS — M545 Low back pain: Secondary | ICD-10-CM | POA: Diagnosis not present

## 2018-03-13 DIAGNOSIS — G8929 Other chronic pain: Secondary | ICD-10-CM | POA: Diagnosis not present

## 2018-03-13 DIAGNOSIS — H00015 Hordeolum externum left lower eyelid: Secondary | ICD-10-CM | POA: Diagnosis not present

## 2018-03-14 DIAGNOSIS — H00035 Abscess of left lower eyelid: Secondary | ICD-10-CM | POA: Diagnosis not present

## 2018-03-26 ENCOUNTER — Ambulatory Visit: Payer: Medicare Other | Attending: Specialist

## 2018-03-26 DIAGNOSIS — G4733 Obstructive sleep apnea (adult) (pediatric): Secondary | ICD-10-CM | POA: Diagnosis not present

## 2018-04-04 DIAGNOSIS — E559 Vitamin D deficiency, unspecified: Secondary | ICD-10-CM | POA: Diagnosis not present

## 2018-04-04 DIAGNOSIS — G4733 Obstructive sleep apnea (adult) (pediatric): Secondary | ICD-10-CM | POA: Diagnosis not present

## 2018-04-04 DIAGNOSIS — F0390 Unspecified dementia without behavioral disturbance: Secondary | ICD-10-CM | POA: Diagnosis not present

## 2018-04-04 DIAGNOSIS — M353 Polymyalgia rheumatica: Secondary | ICD-10-CM | POA: Diagnosis not present

## 2018-04-04 DIAGNOSIS — R269 Unspecified abnormalities of gait and mobility: Secondary | ICD-10-CM | POA: Diagnosis not present

## 2018-04-04 DIAGNOSIS — Z9989 Dependence on other enabling machines and devices: Secondary | ICD-10-CM | POA: Diagnosis not present

## 2018-04-07 DIAGNOSIS — M353 Polymyalgia rheumatica: Secondary | ICD-10-CM | POA: Diagnosis not present

## 2018-04-15 DIAGNOSIS — G4733 Obstructive sleep apnea (adult) (pediatric): Secondary | ICD-10-CM | POA: Diagnosis not present

## 2018-04-23 DIAGNOSIS — R0602 Shortness of breath: Secondary | ICD-10-CM | POA: Diagnosis not present

## 2018-04-23 DIAGNOSIS — Z955 Presence of coronary angioplasty implant and graft: Secondary | ICD-10-CM | POA: Diagnosis not present

## 2018-04-23 DIAGNOSIS — N400 Enlarged prostate without lower urinary tract symptoms: Secondary | ICD-10-CM | POA: Diagnosis not present

## 2018-04-23 DIAGNOSIS — I429 Cardiomyopathy, unspecified: Secondary | ICD-10-CM | POA: Diagnosis not present

## 2018-04-23 DIAGNOSIS — K219 Gastro-esophageal reflux disease without esophagitis: Secondary | ICD-10-CM | POA: Diagnosis not present

## 2018-04-23 DIAGNOSIS — I2510 Atherosclerotic heart disease of native coronary artery without angina pectoris: Secondary | ICD-10-CM | POA: Diagnosis not present

## 2018-04-23 DIAGNOSIS — E669 Obesity, unspecified: Secondary | ICD-10-CM | POA: Diagnosis not present

## 2018-04-23 DIAGNOSIS — I209 Angina pectoris, unspecified: Secondary | ICD-10-CM | POA: Diagnosis not present

## 2018-04-23 DIAGNOSIS — G4733 Obstructive sleep apnea (adult) (pediatric): Secondary | ICD-10-CM | POA: Diagnosis not present

## 2018-04-23 DIAGNOSIS — I10 Essential (primary) hypertension: Secondary | ICD-10-CM | POA: Diagnosis not present

## 2018-06-23 DIAGNOSIS — M1990 Unspecified osteoarthritis, unspecified site: Secondary | ICD-10-CM | POA: Diagnosis not present

## 2018-06-23 DIAGNOSIS — M353 Polymyalgia rheumatica: Secondary | ICD-10-CM | POA: Diagnosis not present

## 2018-06-24 DIAGNOSIS — Z961 Presence of intraocular lens: Secondary | ICD-10-CM | POA: Diagnosis not present

## 2018-06-24 DIAGNOSIS — M0589 Other rheumatoid arthritis with rheumatoid factor of multiple sites: Secondary | ICD-10-CM | POA: Diagnosis not present

## 2018-06-24 DIAGNOSIS — Z79899 Other long term (current) drug therapy: Secondary | ICD-10-CM | POA: Diagnosis not present

## 2018-06-24 DIAGNOSIS — H43813 Vitreous degeneration, bilateral: Secondary | ICD-10-CM | POA: Diagnosis not present

## 2018-06-26 DIAGNOSIS — Z85828 Personal history of other malignant neoplasm of skin: Secondary | ICD-10-CM | POA: Diagnosis not present

## 2018-06-26 DIAGNOSIS — Z08 Encounter for follow-up examination after completed treatment for malignant neoplasm: Secondary | ICD-10-CM | POA: Diagnosis not present

## 2018-06-26 DIAGNOSIS — L218 Other seborrheic dermatitis: Secondary | ICD-10-CM | POA: Diagnosis not present

## 2018-06-26 DIAGNOSIS — L821 Other seborrheic keratosis: Secondary | ICD-10-CM | POA: Diagnosis not present

## 2018-07-08 DIAGNOSIS — M353 Polymyalgia rheumatica: Secondary | ICD-10-CM | POA: Diagnosis not present

## 2018-07-15 DIAGNOSIS — M150 Primary generalized (osteo)arthritis: Secondary | ICD-10-CM | POA: Diagnosis not present

## 2018-07-15 DIAGNOSIS — M1990 Unspecified osteoarthritis, unspecified site: Secondary | ICD-10-CM | POA: Diagnosis not present

## 2018-07-15 DIAGNOSIS — M353 Polymyalgia rheumatica: Secondary | ICD-10-CM | POA: Diagnosis not present

## 2018-07-20 DIAGNOSIS — M542 Cervicalgia: Secondary | ICD-10-CM | POA: Diagnosis not present

## 2018-07-22 ENCOUNTER — Encounter: Payer: Self-pay | Admitting: Occupational Therapy

## 2018-07-22 ENCOUNTER — Ambulatory Visit: Payer: Medicare Other | Attending: Rheumatology | Admitting: Occupational Therapy

## 2018-07-22 DIAGNOSIS — M79642 Pain in left hand: Secondary | ICD-10-CM | POA: Diagnosis not present

## 2018-07-22 DIAGNOSIS — M25641 Stiffness of right hand, not elsewhere classified: Secondary | ICD-10-CM | POA: Insufficient documentation

## 2018-07-22 DIAGNOSIS — M6281 Muscle weakness (generalized): Secondary | ICD-10-CM | POA: Insufficient documentation

## 2018-07-22 DIAGNOSIS — M25642 Stiffness of left hand, not elsewhere classified: Secondary | ICD-10-CM | POA: Diagnosis not present

## 2018-07-22 DIAGNOSIS — M79641 Pain in right hand: Secondary | ICD-10-CM | POA: Insufficient documentation

## 2018-07-22 NOTE — Therapy (Signed)
[facility] [address] [place], [address] Phone: [phone]   Fax:  [phone]  Occupational Therapy Evaluation  Patient Details  Name: Devin Vance MRN: [id number] Date of Birth: June 24, 1928 Referring Provider (OT): Jefm Bryant   Encounter Date: 07/22/2018  OT End of Session - 07/22/18 2028    Visit Number  1    Number of Visits  3    Date for OT Re-Evaluation  09/02/18    OT Start Time  3664    OT Stop Time  1411    OT Time Calculation (min)  66 min    Activity Tolerance  Patient tolerated treatment well    Behavior During Therapy  [facility] for tasks assessed/performed       History reviewed. No pertinent past medical history.  History reviewed. No pertinent surgical history.  There were no vitals filed for this visit.  Subjective Assessment - 07/22/18 2014    Subjective   Had increase hand pain maybe about the last 6 months - Dr Jefm Bryant put me on low dose of prednisone , and my hands feels better since then - but they still hurt at times and are stiff     Patient Stated Goals  I would like the pain and stiffness in my hands better so I can use them better     Currently in Pain?  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, likes to read,  watch tv ,       AROM   Overall AROM Comments  Bilateral thumbs AROM WFL       Strength    Right Hand Grip (lbs)  30    Right Hand Lateral Pinch  18 lbs    Right Hand 3 Point Pinch  12 lbs    Left Hand Grip (lbs)  35    Left Hand Lateral Pinch  16 lbs    Left Hand 3 Point Pinch  15 lbs      Right Hand AROM   R Index  MCP 0-90  70 Degrees    R Index PIP 0-100  92 Degrees    R Long  MCP 0-90  80 Degrees    R Long PIP 0-100  100 Degrees    R Ring  MCP 0-90  85 Degrees    R Ring PIP 0-100  100 Degrees    R Little  MCP 0-90  90 Degrees    R Little PIP 0-100  90 Degrees      Left Hand AROM   L Index  MCP 0-90  75 Degrees    L Index PIP 0-100  100 Degrees    L Long  MCP 0-90  85 Degrees    L Long PIP 0-100  90 Degrees    L Ring  MCP 0-90  80 Degrees    L Ring PIP 0-100  70 Degrees    L Little  MCP 0-90  90 Degrees    L Little PIP 0-100  70 Degrees       Review with pt and wife findings of eval and homeprogram :    Contrast   tendon glides  Opposition to all digits  10 reps  only AROM - not forcefully or tight fist - pain free   Isotoner glove fitted to use as needed  After contrast               OT Education - 07/22/18 2027    Education Details  findings of eval and homeprogram     Person(s) Educated  Patient;Spouse    Methods  Explanation;Demonstration;Handout    Comprehension  Verbalized understanding;Returned demonstration       OT Short Term Goals - 07/22/18 2033      OT SHORT TERM GOAL #1   Title  Pt to be ind in HEP to increase AROM in bilateral hands digits to touch palm without increase symptoms     Baseline  decrease AROM in bilatteral hands - see flowsheet -and pain 5/10 pain     Time  2    Period  Weeks    Status  New    Target Date  08/05/18        OT Long Term Goals - 07/22/18 2034      OT LONG TERM GOAL #1   Title  Pt bilateral hand AROM to [facility] to touch palm with pain less than 1-2/10     Baseline  see flowsheet - decrease AROM L worse than R -and pain 5/10 pain at rest     Time  6    Period  Weeks    Status  New     Target Date  09/02/18      OT LONG TERM GOAL #2   Title  Bilateral grip improve with at least 5 lbs to report able to carry or hold objects with more ease     Baseline  Grip on R 30 and L 35 lbs     Time  6    Period  Weeks    Status  New    Target Date  09/02/18            Plan - 07/22/18 2028    Clinical Impression Statement  Pt refer to OT for bilateral hand pain -diagnosis of inflammatory arthritis and OA - pt report that pain did decrease since  last week when Dr Jefm Bryant put him on  prednisone - pt present at OT Eval wiht pain still about 4-5/10 pain in R hand more than L - but decrease AROM and stiffness in L hand more than R - and decrease grip strength - pt to not report to much issues wiht thumbs and his prehension strength is [facility] - all limiting his functional use of hands in ADL's and IADl's - pt can benefit from OT services to  decrease pain and increase AROM     Occupational performance deficits (Please refer to evaluation for details):  ADL's;IADL's    Rehab Potential  Fair    Current Impairments/barriers affecting  progress:  age , chronic condition and pt not as active anymore     OT Frequency  --   biweekly and then monthly    OT Duration  6 weeks    OT Treatment/Interventions  Self-care/ADL training;Therapeutic exercise;Paraffin;Contrast Bath;Manual Therapy;Patient/family education    Plan  assess progress with HEP     Clinical Decision Making  Several treatment options, min-mod task modification necessary    OT Home Exercise Plan  see pt instruction     Consulted and Agree with Plan of Care  Patient;Family member/caregiver       Patient will benefit from skilled therapeutic intervention in order to improve the following deficits and impairments:  Impaired flexibility, Pain, Impaired UE functional use, Decreased strength  Visit Diagnosis: Pain in left hand - Plan: Ot plan of care cert/re-cert  Pain in right hand - Plan: Ot plan of care cert/re-cert  Stiffness  of left hand, not elsewhere classified - Plan: Ot plan of care cert/re-cert  Stiffness of right hand, not elsewhere classified - Plan: Ot plan of care cert/re-cert  Muscle weakness (generalized) - Plan: Ot plan of care cert/re-cert    Problem List Patient Active Problem List   Diagnosis Date Noted  . HTN (hypertension) 08/15/2017  . Kyphosis of cervical region 10/26/2015  . Intermittent alternating esotropia 05/18/2015  . GERD (gastroesophageal reflux disease) 01/22/2014  . Angina pectoris ([place]) 01/22/2014    Rosalyn Gess OTR/L,CLT 07/22/2018, 8:39 PM  [facility] [address], [place], [address] Phone: [phone]   Fax:  [phone]  Name: JAYMZ TRAYWICK MRN: [id number] Date of Birth: 1928-07-21

## 2018-07-22 NOTE — Patient Instructions (Signed)
Contrast   tendon glides  Opposition to all digits  10 reps  only AROM - not forcefully or tight fist - pain free   Isotoner glove fitted to use as needed  After contrast

## 2018-07-31 ENCOUNTER — Ambulatory Visit (INDEPENDENT_AMBULATORY_CARE_PROVIDER_SITE_OTHER): Payer: Medicare Other | Admitting: Neurology

## 2018-07-31 ENCOUNTER — Encounter: Payer: Self-pay | Admitting: Neurology

## 2018-07-31 DIAGNOSIS — F0281 Dementia in other diseases classified elsewhere with behavioral disturbance: Secondary | ICD-10-CM

## 2018-07-31 DIAGNOSIS — F02818 Dementia in other diseases classified elsewhere, unspecified severity, with other behavioral disturbance: Secondary | ICD-10-CM | POA: Insufficient documentation

## 2018-07-31 DIAGNOSIS — G309 Alzheimer's disease, unspecified: Secondary | ICD-10-CM

## 2018-07-31 DIAGNOSIS — G301 Alzheimer's disease with late onset: Secondary | ICD-10-CM

## 2018-07-31 DIAGNOSIS — F0280 Dementia in other diseases classified elsewhere without behavioral disturbance: Secondary | ICD-10-CM

## 2018-07-31 HISTORY — DX: Dementia in other diseases classified elsewhere, unspecified severity, without behavioral disturbance, psychotic disturbance, mood disturbance, and anxiety: F02.80

## 2018-07-31 MED ORDER — ESCITALOPRAM OXALATE 5 MG PO TABS: 5.0000 mg | ORAL_TABLET | Freq: Every day | ORAL | 3 refills | Status: DC

## 2018-07-31 MED ORDER — DONEPEZIL HCL 10 MG PO TABS: 10.0000 mg | ORAL_TABLET | Freq: Every day | ORAL | 3 refills | Status: DC

## 2018-07-31 MED ORDER — MEMANTINE HCL 10 MG PO TABS: 10.0000 mg | ORAL_TABLET | Freq: Two times a day (BID) | ORAL | 3 refills | Status: DC

## 2018-07-31 NOTE — Patient Instructions (Signed)
We will go up on the Namenda 5 mg in the morning and 10 mg in the evening for one week, then begin 10 mg twice a day.  We will start Lexapro 5 mg daily.

## 2018-07-31 NOTE — Progress Notes (Signed)
Reason for visit: Dementia  Referring physician: Dr. Mikel Cella Devin Vance is a 84 y.o. male  History of present illness:  Devin Vance is an 84 year old right-handed white male with a history of a progressive memory disturbance.  The patient has had memory problems for at least a year, possibly up to 2 years.  The patient has had some short-term memory issues, he has no significant issues with word finding or remembering names for people.  He relies on his wife to help him with keeping up with medications and appointments, but she has been doing this for quite a number of years.  Within the last year, the patient had to give up doing the finances, his wife currently does this.  The patient has a lot of difficulty remembering how to play certain board games that he used to know how to do quite easily.  The patient has sleep apnea, but he does not use CPAP regularly.  He does not have significant fatigue issues during the day.  He is still operating a motor vehicle, he does have some problems with directions.  The patient denies any balance issues or difficulty with falls, he denies any numbness or weakness of the extremities, he does not have a lot of problems controlling the bowels or the bladder but he does have some chronic constipation issues.  The patient has been followed by Dr. Manuella Ghazi from neurology, he is on Aricept 10 mg at night and on Namenda 5 mg twice daily.  The patient has some delusional thinking, he indicates that his wife is yelling at him all the time.  The patient does have some agitation with this.  He comes to this office for an evaluation.  Past Medical History:  Diagnosis Date  . Angina pectoris ([place])   . Arthritis   . Cardiomyopathy ([place])   . COPD (chronic obstructive pulmonary disease) ([place])   . Gastroesophageal reflux disease   . History of colon polyps   . Hyperlipidemia   . Hypertension   . Obesity   . Osteoporosis   . Polymyalgia rheumatica ([place])   . Prostate  cancer ([place])   . Rheumatoid arthritis ([place])   . Sleep apnea     Past Surgical History:  Procedure Laterality Date  . CATARACT EXTRACTION    . CORONARY ANGIOPLASTY    . HEMORRHOIDECTOMY WITH HEMORRHOID BANDING    . PROSTATECTOMY    . STRABISMUS SURGERY    . TONSILLECTOMY      Family History  Problem Relation Age of Onset  . Stroke Mother   . Dementia Mother   . Cancer Father   . Dementia Sister   . Dementia Maternal Aunt     Social history:  reports that he has never smoked. He has never used smokeless tobacco. His alcohol and drug histories are not on file.  Medications:  Prior to Admission medications   Medication Sig Start Date End Date Taking? Authorizing Provider  aspirin EC 81 MG tablet Take by mouth.   Yes [provider]  B Complex Vitamins (VITAMIN B COMPLEX 100 IJ) Take by mouth.   Yes [provider]  Calcium Carbonate-Vitamin D 600-400 MG-UNIT tablet Take by mouth.   Yes [provider]  ferrous sulfate 325 (65 FE) MG tablet Take by mouth.   Yes [provider]  GLUCOSAMINE-CHONDROIT-BIOFL-MN PO Take by mouth.   Yes [provider]  Lutein 20 MG CAPS Take by mouth.   Yes [provider]  memantine (NAMENDA) 5 MG tablet Take 5 mg by mouth 2 (two) times daily.   Yes [provider]  Multiple Vitamin (MULTIVITAMIN) capsule Take by mouth.   Yes [provider]  predniSONE (DELTASONE) 5 MG tablet Take 5 mg by mouth daily with breakfast.   Yes [provider]  donepezil (ARICEPT) 5 MG tablet Take 10 mg by mouth at bedtime.  07/17/17 11/14/17  [provider]  fluticasone (FLONASE) 50 MCG/ACT nasal spray Place into the nose. 08/20/16 08/20/17  [provider]  metoprolol succinate (TOPROL-XL) 25 MG 24 hr tablet Take by mouth. 08/20/16 08/20/17  [provider]  ramipril (ALTACE) 5 MG capsule Take by mouth. 08/20/16 08/20/17  [provider]      Allergies    Allergen Reactions  . Lorazepam Other (See Comments)    Generalized weakness    ROS:  Out of a complete 14 system review of symptoms, the patient complains only of the following symptoms, and all other reviewed systems are negative.  Memory problems Agitation  Blood pressure 114/67, pulse 67, height 5\' 10"  (1.778 m), weight 213 lb 8 oz (96.8 kg).  Physical Exam  General: The patient is alert and cooperative at the time of the examination.  The patient is moderately obese.  Eyes: Pupils are equal, round, and reactive to light. Discs are flat bilaterally.  Neck: The neck is supple, no carotid bruits are noted.  Respiratory: The respiratory examination is clear.  Cardiovascular: The cardiovascular examination reveals a regular rate and rhythm, no obvious murmurs or rubs are noted.  Skin: Extremities are without significant edema.  Neurologic Exam  Mental status: The patient is alert and oriented x 3 at the time of the examination. The Mini-Mental status examination done today shows a total score 23/30.  Cranial nerves: Facial symmetry is present. There is good sensation of the face to pinprick and soft touch bilaterally. The strength of the facial muscles and the muscles to head turning and shoulder shrug are normal bilaterally. Speech is well enunciated, no aphasia or dysarthria is noted. Extraocular movements are full. Visual fields are full. The tongue is midline, and the patient has symmetric elevation of the soft palate. No obvious hearing deficits are noted.  Motor: The motor testing reveals 5 over 5 strength of all 4 extremities. Good symmetric motor tone is noted throughout.  Sensory: Sensory testing is intact to pinprick, soft touch, vibration sensation, and position sense on all 4 extremities. No evidence of extinction is noted.  Coordination: Cerebellar testing reveals good finger-nose-finger and heel-to-shin bilaterally.  Gait and station: Gait is normal. Tandem  gait is unsteady. Romberg is negative. No drift is seen.  Reflexes: Deep tendon reflexes are symmetric and normal bilaterally. Toes are downgoing bilaterally.   MRI brain 09/07/17:  IMPRESSION: 1. No acute intracranial abnormality. 2. Mild chronic small vessel ischemic disease and cerebral atrophy.  * MRI scan images were reviewed online. I agree with the written report.    Assessment/Plan:  1.  Progressive memory disturbance, dementia  2.  Delusional behavior  The patient is having some problems with agitation.  The Namenda dose will be increased taking 5 mg in the morning and 10 mg at evening for 1 week and then go to 10 mg twice daily, a prescription was sent in.  The patient will continue the Aricept at 10 mg at night.  The patient will be given a prescription for Lexapro 5 mg daily, the family will call for  any dose adjustments.  The patient will follow-up in 6 months.  Delusional thought processes are quite difficult to control, however.  Jill Alexanders MD 07/31/2018 3:53 PM  [facility] [address] [place], [place] [address]  Phone [phone] Fax [phone]

## 2018-08-04 ENCOUNTER — Telehealth: Payer: Self-pay | Admitting: Neurology

## 2018-08-04 NOTE — Telephone Encounter (Signed)
Pt wife(on DPR-[place],NINA L) is asking for a call to discuss the management of pt's medications

## 2018-08-04 NOTE — Telephone Encounter (Signed)
Spoke with [name] and clarified Namenda instructions--5mg  qam, 10mg  qhs for one wk, then 10mg  bid. She verbalized understanding of same. She also sts. that since her husband started driving again, (only with her in the car), he has felt better, had a more positive attitude, so she would like to delay starting the Lexapro. We discussed Lexapro side effects. She was concerned that it would possibly make him drowsy and this would interfere with driving. We discussed that if he starts it, he could refrain from driving for a couple of days until he knew if drowsiness would be an issue for him. She verbalized understanding of same. We also discussed that if he starts Lexapro, he should take it consistently for 6 wks. or so to realize full benefit. She verbalized understanding of same/fim

## 2018-08-05 ENCOUNTER — Ambulatory Visit: Payer: Medicare Other | Admitting: Occupational Therapy

## 2018-08-26 ENCOUNTER — Ambulatory Visit: Payer: Medicare Other | Admitting: Occupational Therapy

## 2018-09-17 DIAGNOSIS — J069 Acute upper respiratory infection, unspecified: Secondary | ICD-10-CM | POA: Diagnosis not present

## 2018-09-24 ENCOUNTER — Telehealth: Payer: Self-pay | Admitting: Neurology

## 2018-09-24 MED ORDER — ESCITALOPRAM OXALATE 10 MG PO TABS: 10.0000 mg | ORAL_TABLET | Freq: Every day | ORAL | 3 refills | Status: DC

## 2018-09-24 NOTE — Addendum Note (Signed)
Addended by: Kathrynn Ducking on: 09/24/2018 04:10 PM   Modules accepted: Orders

## 2018-09-24 NOTE — Telephone Encounter (Signed)
[name], please review and advise.

## 2018-09-24 NOTE — Telephone Encounter (Signed)
Patient's wife calling to give update on the patient. He has had behavioral changes over the last week. He is totally refusing to use his BIPAP machine. His sleep is becoming more troubled and is up most of the night. Last night he was up all night. Soon they are going out of town and is wondering if medications should be changed. Please call and discuss.

## 2018-09-24 NOTE — Telephone Encounter (Signed)
I called the patient and talk with the wife.  The patient has had some issues with confusion in the evening hours, some agitation, he wants to stay up all night and and sleep when the morning comes.  We will try increasing the Lexapro to 10 mg daily, a prescription was sent in.

## 2018-10-07 DIAGNOSIS — G629 Polyneuropathy, unspecified: Secondary | ICD-10-CM | POA: Diagnosis not present

## 2018-10-07 DIAGNOSIS — M353 Polymyalgia rheumatica: Secondary | ICD-10-CM | POA: Diagnosis not present

## 2018-10-07 DIAGNOSIS — G309 Alzheimer's disease, unspecified: Secondary | ICD-10-CM | POA: Diagnosis not present

## 2018-10-07 DIAGNOSIS — I779 Disorder of arteries and arterioles, unspecified: Secondary | ICD-10-CM | POA: Diagnosis not present

## 2018-10-07 DIAGNOSIS — J449 Chronic obstructive pulmonary disease, unspecified: Secondary | ICD-10-CM | POA: Diagnosis not present

## 2018-10-07 DIAGNOSIS — E785 Hyperlipidemia, unspecified: Secondary | ICD-10-CM | POA: Diagnosis not present

## 2018-10-07 DIAGNOSIS — I2510 Atherosclerotic heart disease of native coronary artery without angina pectoris: Secondary | ICD-10-CM | POA: Diagnosis not present

## 2018-10-07 DIAGNOSIS — I011 Acute rheumatic endocarditis: Secondary | ICD-10-CM | POA: Diagnosis not present

## 2018-10-07 DIAGNOSIS — K219 Gastro-esophageal reflux disease without esophagitis: Secondary | ICD-10-CM | POA: Diagnosis not present

## 2018-10-07 DIAGNOSIS — I10 Essential (primary) hypertension: Secondary | ICD-10-CM | POA: Diagnosis not present

## 2018-10-07 DIAGNOSIS — Z8546 Personal history of malignant neoplasm of prostate: Secondary | ICD-10-CM | POA: Diagnosis not present

## 2018-10-16 DIAGNOSIS — M1990 Unspecified osteoarthritis, unspecified site: Secondary | ICD-10-CM | POA: Diagnosis not present

## 2018-10-16 DIAGNOSIS — M353 Polymyalgia rheumatica: Secondary | ICD-10-CM | POA: Diagnosis not present

## 2018-10-20 MED ORDER — BUPROPION HCL ER (XL) 150 MG PO TB24: 150.0000 mg | ORAL_TABLET | Freq: Every day | ORAL | 2 refills | Status: DC

## 2018-10-20 NOTE — Telephone Encounter (Signed)
I called and talk with the wife.  The patient appears to have had increased problems with sensation depression while going up on the Lexapro.  This may be related to the medication, we will stop the Lexapro and start Wellbutrin instead.  I have sent in a prescription for the 150 mg extended release tablets to take 1 a day.

## 2018-10-20 NOTE — Telephone Encounter (Signed)
Pts wife [name] requesting a call ASAP, stating the pts behavior has worsened. Stating he has been showing signs of agitation and aggression, and has mentioned killing himself several times. Please call to advise.

## 2018-10-20 NOTE — Addendum Note (Signed)
Addended by: Kathrynn Ducking on: 10/20/2018 02:03 PM   Modules accepted: Orders

## 2018-10-20 NOTE — Telephone Encounter (Addendum)
I contacted the [name] (ok per dpr). She states over the past week the pt has been extremely agitated/ angry and has been threatening to kill his self.   She states she has tried Actuary, but these have been unsuccessful. She states if an appt is needed to recommend further med changes she would be agreeable, but first wanted to see if any recommendations could be made over the phone.  I advised I would fwd to [name] to review and advise.  MB RN

## 2018-10-24 ENCOUNTER — Encounter (HOSPITAL_COMMUNITY): Payer: Self-pay | Admitting: Emergency Medicine

## 2018-10-24 ENCOUNTER — Emergency Department (HOSPITAL_COMMUNITY)
Admission: EM | Admit: 2018-10-24 | Discharge: 2018-10-27 | Disposition: A | Discharge status: Other Acute Inpatient Hospital | Payer: Medicare Other | Attending: Emergency Medicine | Admitting: Emergency Medicine

## 2018-10-24 ENCOUNTER — Other Ambulatory Visit: Payer: Self-pay

## 2018-10-24 DIAGNOSIS — G309 Alzheimer's disease, unspecified: Secondary | ICD-10-CM | POA: Insufficient documentation

## 2018-10-24 DIAGNOSIS — J449 Chronic obstructive pulmonary disease, unspecified: Secondary | ICD-10-CM | POA: Insufficient documentation

## 2018-10-24 DIAGNOSIS — G300 Alzheimer's disease with early onset: Secondary | ICD-10-CM | POA: Diagnosis not present

## 2018-10-24 DIAGNOSIS — F0280 Dementia in other diseases classified elsewhere without behavioral disturbance: Secondary | ICD-10-CM | POA: Insufficient documentation

## 2018-10-24 DIAGNOSIS — G308 Other Alzheimer's disease: Secondary | ICD-10-CM | POA: Diagnosis not present

## 2018-10-24 DIAGNOSIS — F0281 Dementia in other diseases classified elsewhere with behavioral disturbance: Secondary | ICD-10-CM | POA: Diagnosis not present

## 2018-10-24 DIAGNOSIS — Z8546 Personal history of malignant neoplasm of prostate: Secondary | ICD-10-CM | POA: Diagnosis not present

## 2018-10-24 DIAGNOSIS — I10 Essential (primary) hypertension: Secondary | ICD-10-CM | POA: Insufficient documentation

## 2018-10-24 DIAGNOSIS — Z046 Encounter for general psychiatric examination, requested by authority: Secondary | ICD-10-CM | POA: Insufficient documentation

## 2018-10-24 DIAGNOSIS — Z049 Encounter for examination and observation for unspecified reason: Secondary | ICD-10-CM

## 2018-10-24 DIAGNOSIS — Z79899 Other long term (current) drug therapy: Secondary | ICD-10-CM | POA: Insufficient documentation

## 2018-10-24 DIAGNOSIS — F02818 Dementia in other diseases classified elsewhere, unspecified severity, with other behavioral disturbance: Secondary | ICD-10-CM | POA: Diagnosis present

## 2018-10-24 DIAGNOSIS — R451 Restlessness and agitation: Secondary | ICD-10-CM | POA: Diagnosis present

## 2018-10-24 DIAGNOSIS — R45851 Suicidal ideations: Secondary | ICD-10-CM | POA: Diagnosis not present

## 2018-10-24 DIAGNOSIS — Z0289 Encounter for other administrative examinations: Secondary | ICD-10-CM | POA: Diagnosis not present

## 2018-10-24 LAB — CBC
HCT: 36.4 % — ABNORMAL LOW (ref 39.0–52.0)
Hemoglobin: 11.4 g/dL — ABNORMAL LOW (ref 13.0–17.0)
MCH: 30.3 pg (ref 26.0–34.0)
MCHC: 31.3 g/dL (ref 30.0–36.0)
MCV: 96.8 fL (ref 80.0–100.0)
Platelets: 205 10*3/uL (ref 150–400)
RBC: 3.76 MIL/uL — ABNORMAL LOW (ref 4.22–5.81)
RDW: 13.8 % (ref 11.5–15.5)
WBC: 9.5 10*3/uL (ref 4.0–10.5)
nRBC: 0 % (ref 0.0–0.2)

## 2018-10-24 LAB — COMPREHENSIVE METABOLIC PANEL
ALT: 28 U/L (ref 0–44)
AST: 23 U/L (ref 15–41)
Albumin: 3.8 g/dL (ref 3.5–5.0)
Alkaline Phosphatase: 43 U/L (ref 38–126)
Anion gap: 9 (ref 5–15)
BUN: 31 mg/dL — ABNORMAL HIGH (ref 8–23)
CO2: 26 mmol/L (ref 22–32)
Calcium: 8.7 mg/dL — ABNORMAL LOW (ref 8.9–10.3)
Chloride: 103 mmol/L (ref 98–111)
Creatinine, Ser: 1.62 mg/dL — ABNORMAL HIGH (ref 0.61–1.24)
GFR calc Af Amer: 43 mL/min — ABNORMAL LOW (ref >60)
GFR calc non Af Amer: 37 mL/min — ABNORMAL LOW (ref >60)
Glucose, Bld: 134 mg/dL — ABNORMAL HIGH (ref 70–99)
Potassium: 3.9 mmol/L (ref 3.5–5.1)
Sodium: 138 mmol/L (ref 135–145)
Total Bilirubin: 0.5 mg/dL (ref 0.3–1.2)
Total Protein: 6.3 g/dL — ABNORMAL LOW (ref 6.5–8.1)

## 2018-10-24 LAB — RAPID URINE DRUG SCREEN, HOSP PERFORMED
Amphetamines: NOT DETECTED
Barbiturates: NOT DETECTED
Benzodiazepines: NOT DETECTED
Cocaine: NOT DETECTED
Opiates: NOT DETECTED
Tetrahydrocannabinol: NOT DETECTED

## 2018-10-24 LAB — URINALYSIS, ROUTINE W REFLEX MICROSCOPIC
Bilirubin Urine: NEGATIVE
Glucose, UA: NEGATIVE mg/dL
Hgb urine dipstick: NEGATIVE
Ketones, ur: 5 mg/dL — AB
Leukocytes, UA: NEGATIVE
Nitrite: NEGATIVE
Protein, ur: NEGATIVE mg/dL
Specific Gravity, Urine: 1.017 (ref 1.005–1.030)
pH: 5 (ref 5.0–8.0)

## 2018-10-24 LAB — SALICYLATE LEVEL: Salicylate Lvl: <7 mg/dL (ref 2.8–30.0)

## 2018-10-24 LAB — ACETAMINOPHEN LEVEL: Acetaminophen (Tylenol), Serum: <10 ug/mL — ABNORMAL LOW (ref 10–30)

## 2018-10-24 LAB — ETHANOL: Alcohol, Ethyl (B): <10 mg/dL (ref <10)

## 2018-10-24 MED ORDER — MEMANTINE HCL 10 MG PO TABS
10.0000 mg | ORAL_TABLET | Freq: Two times a day (BID) | ORAL | Status: DC
  Administered 2018-10-25 – 2018-10-27 (×5): 10 mg via ORAL
  Filled 2018-10-24 (×6): qty 1

## 2018-10-24 MED ORDER — BUPROPION HCL ER (XL) 150 MG PO TB24
150.0000 mg | ORAL_TABLET | Freq: Every morning | ORAL | Status: DC
  Administered 2018-10-25: 150 mg via ORAL
  Filled 2018-10-24: qty 1

## 2018-10-24 MED ORDER — RAMIPRIL 5 MG PO CAPS
5.0000 mg | ORAL_CAPSULE | Freq: Every morning | ORAL | Status: DC
  Administered 2018-10-25 – 2018-10-27 (×3): 5 mg via ORAL
  Filled 2018-10-24 (×3): qty 1

## 2018-10-24 MED ORDER — DONEPEZIL HCL 5 MG PO TABS
10.0000 mg | ORAL_TABLET | Freq: Every day | ORAL | Status: DC
  Administered 2018-10-24 – 2018-10-26 (×3): 10 mg via ORAL
  Filled 2018-10-24 (×4): qty 2

## 2018-10-24 MED ORDER — HYDROXYCHLOROQUINE SULFATE 200 MG PO TABS
400.0000 mg | ORAL_TABLET | Freq: Every morning | ORAL | Status: DC
  Administered 2018-10-25 – 2018-10-27 (×3): 400 mg via ORAL
  Filled 2018-10-24 (×3): qty 2

## 2018-10-24 MED ORDER — TAMSULOSIN HCL 0.4 MG PO CAPS
0.8000 mg | ORAL_CAPSULE | Freq: Every evening | ORAL | Status: DC
  Administered 2018-10-24 – 2018-10-26 (×3): 0.8 mg via ORAL
  Filled 2018-10-24 (×4): qty 2

## 2018-10-24 MED ORDER — PREDNISONE 2.5 MG PO TABS
2.5000 mg | ORAL_TABLET | Freq: Every morning | ORAL | Status: DC
  Administered 2018-10-25 – 2018-10-27 (×3): 2.5 mg via ORAL
  Filled 2018-10-24 (×3): qty 1

## 2018-10-24 MED ORDER — METOPROLOL SUCCINATE ER 25 MG PO TB24
25.0000 mg | ORAL_TABLET | Freq: Every day | ORAL | Status: DC
  Administered 2018-10-25 – 2018-10-27 (×3): 25 mg via ORAL
  Filled 2018-10-24 (×3): qty 1

## 2018-10-24 NOTE — BH Assessment (Signed)
Tele Assessment Note   Patient Name: Devin Vance MRN: [id number] Referring Physician: T[place] Location of Patient: [facility] Location of Provider: [place]  Devin Vance is an 85 y.o. male presents to [facility] with his wife and daughter voluntarily. Pt has been diagnosed with Alzeheimers. Pt lives with wife in an assisted living facility [facility]. Per reports the patient and his wife had an argument and the pt threatened to kill himself and harm his wife with his pocket knife. Per reports pt has threatened to harm himself a few times in the past. Pt denies that he would ever harm himself or his wife and that he said it out of frustration.  Pt has no hx of inpatient or outpatient services. Pt denies any current or past substance abuse problems. Pt does not appear to be intoxicated or in withdrawal at this time. Per reports pt has had two possible episodes of auditory hallucinations. Per reports pt asked his wife if she is calling for him and reported he was seeing something. Pt reports those two occasions were due to him being sleepy.   Pt seems agitated and argumentative at times during the assessment.  Pt's mood is depressed and affect is labile. Pt is tangential. Pt's insight is poor and judgement is partial. There is no indication Pt is currently responding to internal stimuli or experiencing delusional thought content.   Diagnosis: [G30.9 +] F02.80 [Alzheimer's disease +] Major neurocognitive disorder due to Alzheimer's disease, Probable, Without behavioral disturbance   Past Medical History:  Past Medical History:  Diagnosis Date  . Alzheimer disease ([place]) 07/31/2018  . Angina pectoris ([place])   . Arthritis   . Cardiomyopathy ([place])   . COPD (chronic obstructive pulmonary disease) ([place])   . Gastroesophageal reflux disease   . History of colon polyps   . Hyperlipidemia   . Hypertension   . Obesity   . Osteoporosis   . Polymyalgia rheumatica ([place])   . Prostate  cancer ([place])   . Rheumatoid arthritis ([place])   . Sleep apnea     Past Surgical History:  Procedure Laterality Date  . CATARACT EXTRACTION    . CORONARY ANGIOPLASTY    . HEMORRHOIDECTOMY WITH HEMORRHOID BANDING    . PROSTATECTOMY    . STRABISMUS SURGERY    . TONSILLECTOMY      Family History:  Family History  Problem Relation Age of Onset  . Stroke Mother   . Dementia Mother   . Cancer Father   . Dementia Sister   . Dementia Maternal Aunt     Social History:  reports that he has never smoked. He has never used smokeless tobacco. He reports previous alcohol use. He reports previous drug use.  Additional Social History:  Alcohol / Drug Use Pain Medications: See MAR Prescriptions: See MAR Over the Counter: See MAR History of alcohol / drug use?: No history of alcohol / drug abuse  CIWA:   COWS:    Allergies:  Allergies  Allergen Reactions  . Lorazepam Other (See Comments)    Generalized weakness    Home Medications: (Not in a hospital admission)   OB/GYN Status:  No LMP for male patient.  General Assessment Data [facility]: [facility] TTS Assessment: In system Is this a Tele or Face-to-Face Assessment?: Tele Assessment Is this an Initial Assessment or a Re-assessment for this encounter?: Initial Assessment Patient Accompanied by:: Adult(Wife and daughter) Permission Given to speak with another: Yes Language Other than English: No  What gender do you identify as?: Male Marital status: Married Living Arrangements: Other (Comment)([facility]) Can pt return to current living arrangement?: Yes Admission Status: Voluntary Is patient capable of signing voluntary admission?: Yes Referral Source: Self/Family/Friend([facility]) Insurance type: Medicare     Crisis Care Plan Living Arrangements: Other (Comment)([facility]) Name of Psychiatrist: None Name of Therapist: None  Education Status Is patient  currently in school?: No Is the patient employed, unemployed or receiving disability?: Receiving disability income(Retired)  Risk to self with the past 6 months Suicidal Ideation: Yes-Currently Present Has patient been a risk to self within the past 6 months prior to admission? : No Suicidal Intent: No Has patient had any suicidal intent within the past 6 months prior to admission? : No Is patient at risk for suicide?: Yes Suicidal Plan?: Yes-Currently Present Has patient had any suicidal plan within the past 6 months prior to admission? : No Specify Current Suicidal Plan: Threatened to cut hisself or wife with his pocket knife Access to Means: Yes Specify Access to Suicidal Means: Pt has pocket knife and refused to give it up. What has been your use of drugs/alcohol within the last 12 months?: None Previous Attempts/Gestures: No Intentional Self Injurious Behavior: None Family Suicide History: Unable to assess Recent stressful life event(s): Conflict (Comment), Loss (Comment)(Early Alzheimers ) Persecutory voices/beliefs?: No Depression: Yes Depression Symptoms: Tearfulness, Feeling worthless/self pity, Feeling angry/irritable Substance abuse history and/or treatment for substance abuse?: No Suicide prevention information given to non-admitted patients: Not applicable  Risk to Others within the past 6 months Homicidal Ideation: No Does patient have any lifetime risk of violence toward others beyond the six months prior to admission? : No Thoughts of Harm to Others: Yes-Currently Present Comment - Thoughts of Harm to Others: Pt threatened to cut wife if they didn't quite arguing.  Current Homicidal Intent: No Current Homicidal Plan: Yes-Currently Present History of harm to others?: No Assessment of Violence: None Noted Does patient have access to weapons?: Yes (Comment) Criminal Charges Pending?: No Does patient have a court date: No Is patient on probation?:  No  Psychosis Hallucinations: Auditory(Wife reports he sometimes asks if she is calling for him she) Delusions: None noted  Mental Status Report Appearance/Hygiene: Unable to Assess(Cracked screen) Eye Contact: Unable to Assess Motor Activity: Freedom of movement Speech: Argumentative, Tangential Level of Consciousness: Alert, Irritable Mood: Depressed Affect: Labile Anxiety Level: Moderate Thought Processes: Coherent, Relevant Judgement: Impaired Orientation: Person, Place, Time, Situation Obsessive Compulsive Thoughts/Behaviors: None  Cognitive Functioning Concentration: Decreased Memory: Remote Impaired Is patient IDD: No Insight: Poor Impulse Control: Poor Appetite: Good Have you had any weight changes? : No Change Sleep: Decreased Total Hours of Sleep: 6 Vegetative Symptoms: None  ADLScreening [facility] Assessment Services) Patient's cognitive ability adequate to safely complete daily activities?: Yes Patient able to express need for assistance with ADLs?: Yes Independently performs ADLs?: Yes (appropriate for developmental age)  Prior Inpatient Therapy Prior Inpatient Therapy: No  Prior Outpatient Therapy Prior Outpatient Therapy: No  ADL Screening (condition at time of admission) Patient's cognitive ability adequate to safely complete daily activities?: Yes Is the patient deaf or have difficulty hearing?: No Does the patient have difficulty seeing, even when wearing glasses/contacts?: No Does the patient have difficulty concentrating, remembering, or making decisions?: Yes Patient able to express need for assistance with ADLs?: Yes Does the patient have difficulty dressing or bathing?: No Independently performs ADLs?: Yes (appropriate for developmental age) Does the patient have difficulty walking  or climbing stairs?: No Weakness of Legs: None Weakness of Arms/Hands: None  Home Assistive Devices/Equipment Home Assistive Devices/Equipment: None  Therapy  Consults (therapy consults require a physician order) PT Evaluation Needed: No OT Evalulation Needed: No SLP Evaluation Needed: No Abuse/Neglect Assessment (Assessment to be complete while patient is alone) Abuse/Neglect Assessment Can Be Completed: Unable to assess, patient is non-responsive or altered mental status Values / Beliefs Cultural Requests During Hospitalization: None Spiritual Requests During Hospitalization: None Consults Spiritual Care Consult Needed: No Social Work Consult Needed: No Regulatory affairs officer (For Healthcare) Does Patient Have a Medical Advance Directive?: No Would patient like information on creating a medical advance directive?: No - Patient declined     Per Lindon Romp, NP pt meets inpatient criteria and recommends Geropsychiatry. CSW to look for placement.       Disposition:  Disposition Initial Assessment Completed for this Encounter: Yes  This service was provided via telemedicine using a 2-way, interactive audio and video technology.  Names of all persons participating in this telemedicine service and their role in this encounter. Name: Jyquan Kenley Role: Pt  Name: Steffanie Rainwater, [place], [place] Role: Therapeutic Triage Specialist   Name: Kailash Hinze Role: Wife  Name:  Role: Daughter    Steffanie Rainwater, [place], [place] 10/24/2018 9:41 PM

## 2018-10-24 NOTE — ED Notes (Signed)
Bed: RQ41 Expected date:  Expected time:  Means of arrival:  Comments: Triage 4

## 2018-10-24 NOTE — ED Provider Notes (Signed)
[facility] DEPT Provider Note   CSN: [id number] Arrival date & time: 10/24/18  1906     History   Chief Complaint Chief Complaint  Patient presents with  . Medical Clearance    HPI Devin Vance is a 85 y.o. male.  The history is provided by the patient and medical records. No language interpreter was used.  Mental Health Problem  Presenting symptoms: agitation, suicidal thoughts and suicidal threats   Presenting symptoms: no hallucinations, no homicidal ideas, no self-mutilation and no suicide attempt   Patient accompanied by:  Family member Degree of incapacity (severity):  Moderate Onset quality:  Gradual Timing:  Constant Progression:  Waxing and waning Chronicity:  New Context: not stressful life event   Treatment compliance:  Untreated Relieved by:  Nothing Associated symptoms: no abdominal pain, no anxiety, no chest pain, no fatigue and no headaches     Past Medical History:  Diagnosis Date  . Alzheimer disease ([place]) 07/31/2018  . Angina pectoris ([place])   . Arthritis   . Cardiomyopathy ([place])   . COPD (chronic obstructive pulmonary disease) ([place])   . Gastroesophageal reflux disease   . History of colon polyps   . Hyperlipidemia   . Hypertension   . Obesity   . Osteoporosis   . Polymyalgia rheumatica ([place])   . Prostate cancer ([place])   . Rheumatoid arthritis ([place])   . Sleep apnea     Patient Active Problem List   Diagnosis Date Noted  . Alzheimer disease ([place]) 07/31/2018  . HTN (hypertension) 08/15/2017  . Kyphosis of cervical region 10/26/2015  . Intermittent alternating esotropia 05/18/2015  . GERD (gastroesophageal reflux disease) 01/22/2014  . Angina pectoris ([place]) 01/22/2014    Past Surgical History:  Procedure Laterality Date  . CATARACT EXTRACTION    . CORONARY ANGIOPLASTY    . HEMORRHOIDECTOMY WITH HEMORRHOID BANDING    . PROSTATECTOMY    . STRABISMUS SURGERY    . TONSILLECTOMY          Home  Medications    Prior to Admission medications   Medication Sig Start Date End Date Taking? Authorizing Provider  aspirin EC 81 MG tablet Take by mouth.    [provider]  B Complex Vitamins (VITAMIN B COMPLEX 100 IJ) Take by mouth.    [provider]  buPROPion (WELLBUTRIN XL) 150 MG 24 hr tablet Take 1 tablet (150 mg total) by mouth daily. 10/20/18   Kathrynn Ducking, MD  Calcium Carbonate-Vitamin D 600-400 MG-UNIT tablet Take by mouth.    [provider]  donepezil (ARICEPT) 10 MG tablet Take 1 tablet (10 mg total) by mouth at bedtime. 07/31/18   Kathrynn Ducking, MD  ferrous sulfate 325 (65 FE) MG tablet Take by mouth.    [provider]  fluticasone (FLONASE) 50 MCG/ACT nasal spray Place into the nose. 08/20/16 08/20/17  [provider]  GLUCOSAMINE-CHONDROIT-BIOFL-MN PO Take by mouth.    [provider]  Lutein 20 MG CAPS Take by mouth.    [provider]  memantine (NAMENDA) 10 MG tablet Take 1 tablet (10 mg total) by mouth 2 (two) times daily. 07/31/18   Kathrynn Ducking, MD  metoprolol succinate (TOPROL-XL) 25 MG 24 hr tablet Take by mouth. 08/20/16 08/20/17  [provider]  Multiple Vitamin (MULTIVITAMIN) capsule Take by mouth.    [provider]  predniSONE (DELTASONE) 5 MG tablet Take 5 mg by mouth daily with breakfast.    [provider]  ramipril (ALTACE) 5 MG capsule Take by mouth. 08/20/16 08/20/17  [provider]    Family History Family History  Problem Relation Age of Onset  . Stroke Mother   . Dementia Mother   . Cancer Father   . Dementia Sister   . Dementia Maternal Aunt     Social History Social History   Tobacco Use  . Smoking status: Never Smoker  . Smokeless tobacco: Never Used  Substance Use Topics  . Alcohol use: Not on file  . Drug use: Not on file     Allergies   Lorazepam   Review of Systems Review of Systems  Constitutional: Negative for  chills, diaphoresis, fatigue and fever.  HENT: Negative for congestion and rhinorrhea.   Respiratory: Negative for cough, chest tightness, shortness of breath and wheezing.   Cardiovascular: Negative for chest pain.  Gastrointestinal: Negative for abdominal pain, constipation, diarrhea, nausea and vomiting.  Genitourinary: Negative for flank pain and frequency.  Musculoskeletal: Negative for back pain, neck pain and neck stiffness.  Neurological: Negative for weakness, light-headedness and headaches.  Psychiatric/Behavioral: Positive for agitation, confusion and suicidal ideas. Negative for hallucinations, homicidal ideas and self-injury. The patient is not nervous/anxious.   All other systems reviewed and are negative.    Physical Exam Updated Vital Signs BP (!) 150/90   Pulse 72   Resp (!) 21   SpO2 96%   Physical Exam Vitals signs and nursing note reviewed.  Constitutional:      General: He is not in acute distress.    Appearance: He is well-developed. He is not ill-appearing, toxic-appearing or diaphoretic.  HENT:     Head: Normocephalic and atraumatic.     Nose: No congestion or rhinorrhea.     Mouth/Throat:     Pharynx: No oropharyngeal exudate or posterior oropharyngeal erythema.  Eyes:     Conjunctiva/sclera: Conjunctivae normal.     Pupils: Pupils are equal, round, and reactive to light.  Neck:     Musculoskeletal: Neck supple.  Cardiovascular:     Rate and Rhythm: Normal rate and regular rhythm.     Heart sounds: No murmur.  Pulmonary:     Effort: Pulmonary effort is normal. No respiratory distress.     Breath sounds: Normal breath sounds. No wheezing, rhonchi or rales.  Chest:     Chest wall: No tenderness.  Abdominal:     General: There is no distension.     Palpations: Abdomen is soft.     Tenderness: There is no abdominal tenderness.  Skin:    General: Skin is warm and dry.     Capillary Refill: Capillary refill takes less than 2 seconds.  Neurological:      General: No focal deficit present.     Mental Status: He is alert.     Sensory: No sensory deficit.     Motor: No weakness.  Psychiatric:        Speech: He is communicative.        Behavior: Behavior is not agitated.        Thought Content: Thought content includes suicidal (told family) ideation. Thought content does not include homicidal ideation. Thought content does not include homicidal or suicidal plan.      ED Treatments / Results  Labs (all labs ordered are listed, but only abnormal results are displayed) Labs Reviewed  COMPREHENSIVE METABOLIC PANEL - Abnormal; Notable for the following components:      Result Value   Glucose, Bld 134 (*)  BUN 31 (*)    Creatinine, Ser 1.62 (*)    Calcium 8.7 (*)    Total Protein 6.3 (*)    GFR calc non Af Amer 37 (*)    GFR calc Af Amer 43 (*)    All other components within normal limits  ACETAMINOPHEN LEVEL - Abnormal; Notable for the following components:   Acetaminophen (Tylenol), Serum <10 (*)    All other components within normal limits  CBC - Abnormal; Notable for the following components:   RBC 3.76 (*)    Hemoglobin 11.4 (*)    HCT 36.4 (*)    All other components within normal limits  URINE CULTURE  ETHANOL  SALICYLATE LEVEL  RAPID URINE DRUG SCREEN, HOSP PERFORMED  URINALYSIS, ROUTINE W REFLEX MICROSCOPIC    EKG EKG Interpretation  Date/Time:  Friday October 24 2018 20:48:59 EST Ventricular Rate:  71 PR Interval:    QRS Duration: 110 QT Interval:  391 QTC Calculation: 425 R Axis:   47 Text Interpretation:  Sinus rhythm Prolonged PR interval when compared to prior, no significant changes seen.  No STEMI Confirmed by Antony Blackbird [phone]) on 10/24/2018 9:04:55 PM   Radiology No results found.  Procedures Procedures (including critical care time)  Medications Ordered in ED Medications  buPROPion (WELLBUTRIN XL) 24 hr tablet 150 mg (has no administration in time range)  donepezil (ARICEPT) tablet 10  mg (10 mg Oral Given 10/24/18 2317)  hydroxychloroquine (PLAQUENIL) tablet 400 mg (has no administration in time range)  memantine (NAMENDA) tablet 10 mg (has no administration in time range)  metoprolol succinate (TOPROL-XL) 24 hr tablet 25 mg (has no administration in time range)  predniSONE (DELTASONE) tablet 2.5 mg (has no administration in time range)  ramipril (ALTACE) capsule 5 mg (has no administration in time range)  tamsulosin (FLOMAX) capsule 0.8 mg (0.8 mg Oral Given 10/24/18 2316)     Initial Impression / Assessment and Plan / ED Course  I have reviewed the triage vital signs and the nursing notes.  Pertinent labs & imaging results that were available during my care of the patient were reviewed by me and considered in my medical decision making (see chart for details).     Devin Vance is a 85 y.o. male with a past medical history significant for hypertension, GERD, COPD, hyperlipidemia, prior prostate cancer, rheumatoid arthritis, and Alzheimer's dementia who presents from [facility] for suicidal ideation.  Patient is coming by his wife and his daughter who report that patient has had worsening dementia over the last several months.  They say that over the last several weeks it has continued to worsen and he is getting into more arguments with his wife who is struggling with her own diagnoses of chronic white matter disease.  According to daughter, they got into an argument this evening at the facility and could not come to a resolution and patient threatened to kill himself.  Patient also had a pocket knife and would not surrendered to the facility.  Upon arrival, nursing was able to get the pocket knife from the patient.  Patient says that he did say he was going to kill himself this evening in the midst of the argument.  He says that he does not think he would actually do it.  He says that he was in the [employer] for years and was a Clinical biochemist as well.    Family reports that he has  made suicidal threats over the last several weeks which is  new.  He denies any physical lights on arrival specifically no fevers, chills, headache, neck pain, neck stiffness, nausea, vomiting, constipation, diarrhea, chest pain, shortness of breath.  Patient denies any physical problems.  No evidence of injury during argument.  On exam, lungs clear and chest is nontender.  Abdomen is nontender.  Patient is extremely talkative and is repetitive and argumentative with his wife.  Given the concern from the facility and the daughter that the patient is making more suicidal ideation and threats, TTS will be consulted.  He will have screening laboratory testing however I suspect this is related to his progressing Alzheimer's dementia.  Given lack of other symptoms have low suspicion for a metabolic etiology of symptoms.  Patient's laboratory testing appears similar to prior.  Creatinine is similar to prior.  Salicylate and Tylenol level negative.  EKG shows no evidence of prolonged QT.  After laboratory work-up, patient is felt to be medically cleared.  TTS note reports that patient is appropriate for inpatient at likely [facility] facility.  Home medication orders will be placed and anticipate placement.  11:22 PM Family requested patient be allowed to use his CPAP as he does better with this.  Orders placed to use home CPAP settings.  Anticipate placement tomorrow.  Family is hoping for [facility].   Final Clinical Impressions(s) / ED Diagnoses   Final diagnoses:  Suicidal ideation    ED Discharge Orders    None      Clinical Impression: 1. Suicidal ideation     Disposition: Awaiting placement for psychiatry  This note was prepared with assistance of Dragon voice recognition software. Occasional wrong-word or sound-a-like substitutions may have occurred due to the inherent limitations of voice recognition software.      Deangelo Berns, Gwenyth Allegra, MD 10/24/18 2322

## 2018-10-24 NOTE — ED Notes (Signed)
TTS in progress 

## 2018-10-24 NOTE — Progress Notes (Signed)
Pt meets inpatient criteria per Lindon Romp, NP. Referral information has been sent to the following hospitals for review: [facility]   Family voices hope that pt will be accepted to [facility] and have spoken to admissions staff there earlier today.  Disposition will continue to assist with inpatient placement needs.   Audree Camel, LCSW, [place] Disposition [place] [facility]/TTS [phone]

## 2018-10-24 NOTE — ED Notes (Signed)
Patient's family states that earlier today the patient told them that if his wife did not forgive him he would kill himself by cutting himself with a knife or jumping from a bridge. He now says he does not want to kill him self. Patient's family is concerned that he may be a harm to himself or others.

## 2018-10-24 NOTE — ED Notes (Signed)
EDP at bedside  

## 2018-10-24 NOTE — ED Triage Notes (Signed)
Patient presents with daughter and wife. [facility] is from [facility]. Patient and wife were having a disagreement in the lobby where patient stated that he "may kill himself if the issue isn't resolved" per daughter. Patient pleasant and cooperative on arriva. Per daughter, patient has a pocket knife he refuses to give up.

## 2018-10-24 NOTE — ED Notes (Signed)
Bed: [facility] Expected date:  Expected time:  Means of arrival:  Comments: 

## 2018-10-25 DIAGNOSIS — F0281 Dementia in other diseases classified elsewhere with behavioral disturbance: Secondary | ICD-10-CM

## 2018-10-25 DIAGNOSIS — G300 Alzheimer's disease with early onset: Secondary | ICD-10-CM | POA: Diagnosis not present

## 2018-10-25 DIAGNOSIS — G309 Alzheimer's disease, unspecified: Secondary | ICD-10-CM | POA: Diagnosis not present

## 2018-10-25 MED ORDER — DIPHENHYDRAMINE HCL 50 MG/ML IJ SOLN
50.0000 mg | Freq: Once | INTRAMUSCULAR | Status: AC
  Administered 2018-10-25: 50 mg via INTRAMUSCULAR
  Filled 2018-10-25: qty 1

## 2018-10-25 MED ORDER — RISPERIDONE 0.5 MG PO TABS
0.2500 mg | ORAL_TABLET | Freq: Two times a day (BID) | ORAL | Status: DC
  Administered 2018-10-25 (×2): 0.25 mg via ORAL
  Filled 2018-10-25 (×2): qty 1

## 2018-10-25 MED ORDER — STERILE WATER FOR INJECTION IJ SOLN
INTRAMUSCULAR | Status: AC
  Administered 2018-10-25: 1 mL
  Filled 2018-10-25: qty 10

## 2018-10-25 MED ORDER — OLANZAPINE 10 MG IM SOLR
10.0000 mg | Freq: Once | INTRAMUSCULAR | Status: AC
  Administered 2018-10-25: 10 mg via INTRAMUSCULAR
  Filled 2018-10-25: qty 10

## 2018-10-25 NOTE — ED Notes (Signed)
Daughter came back to see patient. Daughter was educated on the transferring, if patient transfer to another facility she would like to be notified.

## 2018-10-25 NOTE — Consult Note (Addendum)
[facility] Face-to-Face Psychiatry Consult   Reason for Consult:  Agitation with suicide threats Referring Physician:  EDP Patient Identification: Devin Vance MRN:  [id number] Principal Diagnosis: Alzheimer's dementia with behavioral disturbance [facility]) Diagnosis:  Principal Problem:   Alzheimer's dementia with behavioral disturbance ([place])   Total Time spent with patient: 45 minutes  Subjective:   Devin Vance is a 85 y.o. male patient admitted with suicide threats and agitation.  HPI:  85 yo male who had an altercation with his wife and threatened suicide.  He minimizes his actions because his wife would not talk to him and he said if he threatened to hurt himself would she change her mind.  Calm and cooperative on assessment, rambles.  Denies suicidal/homicidal ideations, hallucinations, and substance abuse.  He is agreeable to start medications for his agitation.  Agitation increases at night, decreases in the morning.  None on assessment, denies depression.  Expresses himself well.  Past Psychiatric History: dementia  Risk to Self: Suicidal Ideation: Yes-Currently Present Suicidal Intent: No Is patient at risk for suicide?: Yes Suicidal Plan?: Yes-Currently Present Specify Current Suicidal Plan: Threatened to cut hisself or wife with his pocket knife Access to Means: Yes Specify Access to Suicidal Means: Pt has pocket knife and refused to give it up. What has been your use of drugs/alcohol within the last 12 months?: None Intentional Self Injurious Behavior: None Risk to Others: Homicidal Ideation: No Thoughts of Harm to Others: Yes-Currently Present Comment - Thoughts of Harm to Others: Pt threatened to cut wife if they didn't quite arguing.  Current Homicidal Intent: No Current Homicidal Plan: Yes-Currently Present History of harm to others?: No Assessment of Violence: None Noted Does patient have access to weapons?: Yes (Comment) Criminal Charges Pending?: No Does patient have a  court date: No Prior Inpatient Therapy: Prior Inpatient Therapy: No Prior Outpatient Therapy: Prior Outpatient Therapy: No  Past Medical History:  Past Medical History:  Diagnosis Date  . Alzheimer disease ([place]) 07/31/2018  . Angina pectoris ([place])   . Arthritis   . Cardiomyopathy ([place])   . COPD (chronic obstructive pulmonary disease) ([place])   . Gastroesophageal reflux disease   . History of colon polyps   . Hyperlipidemia   . Hypertension   . Obesity   . Osteoporosis   . Polymyalgia rheumatica ([place])   . Prostate cancer ([place])   . Rheumatoid arthritis ([place])   . Sleep apnea     Past Surgical History:  Procedure Laterality Date  . CATARACT EXTRACTION    . CORONARY ANGIOPLASTY    . HEMORRHOIDECTOMY WITH HEMORRHOID BANDING    . PROSTATECTOMY    . STRABISMUS SURGERY    . TONSILLECTOMY     Family History:  Family History  Problem Relation Age of Onset  . Stroke Mother   . Dementia Mother   . Cancer Father   . Dementia Sister   . Dementia Maternal Aunt    Family Psychiatric  History: see above Social History:  Social History   Substance and Sexual Activity  Alcohol Use Not Currently     Social History   Substance and Sexual Activity  Drug Use Not Currently    Social History   Socioeconomic History  . Marital status: Married    Spouse name: Not on file  . Number of children: Not on file  . Years of education: Not on file  . Highest education level: Not on file  Occupational History  . Not on file  Social Needs  .  Financial resource strain: Not on file  . Food insecurity:    Worry: Not on file    Inability: Not on file  . Transportation needs:    Medical: Not on file    Non-medical: Not on file  Tobacco Use  . Smoking status: Never Smoker  . Smokeless tobacco: Never Used  Substance and Sexual Activity  . Alcohol use: Not Currently  . Drug use: Not Currently  . Sexual activity: Not on file  Lifestyle  . Physical activity:    Days per week: Not on  file    Minutes per session: Not on file  . Stress: Not on file  Relationships  . Social connections:    Talks on phone: Not on file    Gets together: Not on file    Attends religious service: Not on file    Active member of club or organization: Not on file    Attends meetings of clubs or organizations: Not on file    Relationship status: Not on file  Other Topics Concern  . Not on file  Social History Narrative  . Not on file   Additional Social History:    Allergies:   Allergies  Allergen Reactions  . Lorazepam Other (See Comments)    Generalized weakness    Labs:  Results for orders placed or performed during the hospital encounter of 10/24/18 (from the past 48 hour(s))  Comprehensive metabolic panel     Status: Abnormal   Collection Time: 10/24/18  8:18 PM  Result Value Ref Range   Sodium 138 135 - 145 mmol/L   Potassium 3.9 3.5 - 5.1 mmol/L   Chloride 103 98 - 111 mmol/L   CO2 26 22 - 32 mmol/L   Glucose, Bld 134 (H) 70 - 99 mg/dL   BUN 31 (H) 8 - 23 mg/dL   Creatinine, Ser 1.62 (H) 0.61 - 1.24 mg/dL   Calcium 8.7 (L) 8.9 - 10.3 mg/dL   Total Protein 6.3 (L) 6.5 - 8.1 g/dL   Albumin 3.8 3.5 - 5.0 g/dL   AST 23 15 - 41 U/L   ALT 28 0 - 44 U/L   Alkaline Phosphatase 43 38 - 126 U/L   Total Bilirubin 0.5 0.3 - 1.2 mg/dL   GFR calc non Af Amer 37 (L) >60 mL/min   GFR calc Af Amer 43 (L) >60 mL/min   Anion gap 9 5 - 15    Comment: Performed at [facility], [place] [address] [place], [place] [address]  Ethanol     Status: None   Collection Time: 10/24/18  8:18 PM  Result Value Ref Range   Alcohol, Ethyl (B) <10 <10 mg/dL    Comment: (NOTE) Lowest detectable limit for serum alcohol is 10 mg/dL. For medical purposes only. Performed at [facility], [place] [address], [place], [place] [address]   Salicylate level     Status: None   Collection Time: 10/24/18  8:18 PM  Result Value Ref Range   Salicylate Lvl <8.6 2.8 - 30.0  mg/dL    Comment: Performed at [facility], [place] [address] [place], [place] [address]  Acetaminophen level     Status: Abnormal   Collection Time: 10/24/18  8:18 PM  Result Value Ref Range   Acetaminophen (Tylenol), Serum <10 (L) 10 - 30 ug/mL    Comment: (NOTE) Therapeutic concentrations vary significantly. A range of 10-30 ug/mL  may be an effective concentration for many patients. However,  some  are best treated at concentrations outside of this range. Acetaminophen concentrations >150 ug/mL at 4 hours after ingestion  and >50 ug/mL at 12 hours after ingestion are often associated with  toxic reactions. Performed at [facility], [place] [address] [place], [place] [address]   cbc     Status: Abnormal   Collection Time: 10/24/18  8:18 PM  Result Value Ref Range   WBC 9.5 4.0 - 10.5 K/uL   RBC 3.76 (L) 4.22 - 5.81 MIL/uL   Hemoglobin 11.4 (L) 13.0 - 17.0 g/dL   HCT 36.4 (L) 39.0 - 52.0 %   MCV 96.8 80.0 - 100.0 fL   MCH 30.3 26.0 - 34.0 pg   MCHC 31.3 30.0 - 36.0 g/dL   RDW 13.8 11.5 - 15.5 %   Platelets 205 150 - 400 K/uL   nRBC 0.0 0.0 - 0.2 %    Comment: Performed at [facility], [place] [address] [place], [place] [address]  Rapid urine drug screen (hospital performed)     Status: None   Collection Time: 10/24/18 10:08 PM  Result Value Ref Range   Opiates NONE DETECTED NONE DETECTED   Cocaine NONE DETECTED NONE DETECTED   Benzodiazepines NONE DETECTED NONE DETECTED   Amphetamines NONE DETECTED NONE DETECTED   Tetrahydrocannabinol NONE DETECTED NONE DETECTED   Barbiturates NONE DETECTED NONE DETECTED    Comment: (NOTE) DRUG SCREEN FOR MEDICAL PURPOSES ONLY.  IF CONFIRMATION IS NEEDED FOR ANY PURPOSE, NOTIFY LAB WITHIN 5 DAYS. LOWEST DETECTABLE LIMITS FOR URINE DRUG SCREEN Drug Class                     Cutoff (ng/mL) Amphetamine and metabolites    1000 Barbiturate and metabolites    200 Benzodiazepine                  086 Tricyclics and metabolites     300 Opiates and metabolites        300 Cocaine and metabolites        300 THC                            50 Performed at [facility] [place] [address], [place], [place] [address]   Urinalysis, Routine w reflex microscopic     Status: Abnormal   Collection Time: 10/24/18 10:08 PM  Result Value Ref Range   Color, Urine YELLOW YELLOW   APPearance CLEAR CLEAR   Specific Gravity, Urine 1.017 1.005 - 1.030   pH 5.0 5.0 - 8.0   Glucose, UA NEGATIVE NEGATIVE mg/dL   Hgb urine dipstick NEGATIVE NEGATIVE   Bilirubin Urine NEGATIVE NEGATIVE   Ketones, ur 5 (A) NEGATIVE mg/dL   Protein, ur NEGATIVE NEGATIVE mg/dL   Nitrite NEGATIVE NEGATIVE   Leukocytes, UA NEGATIVE NEGATIVE    Comment: Performed at [facility] [address], [place], [place] [address]    Current Facility-Administered Medications  Medication Dose Route Frequency Provider Last Rate Last Dose  . buPROPion (WELLBUTRIN XL) 24 hr tablet 150 mg  150 mg Oral q morning - 10a Tegeler, Gwenyth Allegra, MD   150 mg at 10/25/18 0909  . donepezil (ARICEPT) tablet 10 mg  10 mg Oral QHS Tegeler, Gwenyth Allegra, MD   10 mg at 10/24/18 2317  . hydroxychloroquine (PLAQUENIL) tablet 400 mg  400 mg Oral q morning - 10a Tegeler, Gwenyth Allegra, MD  400 mg at 10/25/18 0909  . memantine (NAMENDA) tablet 10 mg  10 mg Oral BID Tegeler, Gwenyth Allegra, MD   10 mg at 10/25/18 0910  . metoprolol succinate (TOPROL-XL) 24 hr tablet 25 mg  25 mg Oral Daily Tegeler, Gwenyth Allegra, MD   25 mg at 10/25/18 0909  . predniSONE (DELTASONE) tablet 2.5 mg  2.5 mg Oral q morning - 10a Tegeler, Gwenyth Allegra, MD   2.5 mg at 10/25/18 0910  . ramipril (ALTACE) capsule 5 mg  5 mg Oral q morning - 10a Tegeler, Gwenyth Allegra, MD   5 mg at 10/25/18 0909  . tamsulosin (FLOMAX) capsule 0.8 mg  0.8 mg Oral QPM Tegeler, Gwenyth Allegra, MD   0.8 mg at 10/24/18 2316   Current Outpatient Medications   Medication Sig Dispense Refill  . aspirin EC 81 MG tablet Take by mouth.    Marland Kitchen buPROPion (WELLBUTRIN XL) 150 MG 24 hr tablet Take 1 tablet (150 mg total) by mouth daily. (Patient taking differently: Take 150 mg by mouth every morning. ) 30 tablet 2  . Calcium Carbonate-Vitamin D 600-400 MG-UNIT tablet Take by mouth.    . Cholecalciferol (VITAMIN D-3) 125 MCG (5000 UT) TABS Take 1 tablet by mouth every morning.    . donepezil (ARICEPT) 10 MG tablet Take 1 tablet (10 mg total) by mouth at bedtime. 30 tablet 3  . hydroxychloroquine (PLAQUENIL) 200 MG tablet Take 400 mg by mouth every morning.    . Lactobacillus (PROBIOTIC ACIDOPHILUS PO) Take 1 tablet by mouth daily.    . Lutein 20 MG CAPS Take by mouth.    . memantine (NAMENDA) 10 MG tablet Take 1 tablet (10 mg total) by mouth 2 (two) times daily. 180 tablet 3  . metoprolol succinate (TOPROL-XL) 25 MG 24 hr tablet Take by mouth.    . Multiple Vitamin (MULTIVITAMIN) capsule Take by mouth.    . predniSONE (DELTASONE) 2.5 MG tablet Take 2.5 mg by mouth every morning.    . ramipril (ALTACE) 5 MG capsule Take 5 mg by mouth every morning.     . tamsulosin (FLOMAX) 0.4 MG CAPS capsule Take 0.8 mg by mouth every evening.      Musculoskeletal: Strength & Muscle Tone: within normal limits Gait & Station: normal Patient leans: N/A  Psychiatric Specialty Exam: Physical Exam  Nursing note and vitals reviewed. Constitutional: He appears well-developed and well-nourished.  HENT:  Head: Normocephalic.  Neck: Normal range of motion.  Respiratory: Effort normal.  Musculoskeletal: Normal range of motion.  Neurological: He is alert.  Psychiatric: His speech is normal and behavior is normal. Thought content normal. His mood appears anxious. His affect is blunt. Cognition and memory are impaired. He expresses impulsivity.    Review of Systems  Psychiatric/Behavioral: Positive for memory loss. The patient is nervous/anxious.   All other systems reviewed  and are negative.   Blood pressure 120/85, pulse 96, resp. rate 20, SpO2 96 %.There is no height or weight on file to calculate BMI.  General Appearance: Casual  Eye Contact:  Good  Speech:  Normal Rate  Volume:  Normal  Mood:  Anxious  Affect:  Blunt  Thought Process:  Coherent and Descriptions of Associations: Intact  Orientation:  Other:  person, place  Thought Content:  rambles   Suicidal Thoughts:  Yes.  without intent/plan  Homicidal Thoughts:  No  Memory:  Immediate;   Fair Recent;   Fair Remote;   Fair  Judgement:  Poor  Insight:  Lacking  Psychomotor Activity:  Normal  Concentration:  Concentration: Fair and Attention Span: Fair  Recall:  [place] of Knowledge:  Good  Language:  Good  Akathisia:  No  Handed:  Right  AIMS (if indicated):     Assets:  Housing Leisure Time Resilience Social Support  ADL's:  Intact  Cognition:  Impaired,  Mild  Sleep:        Treatment Plan Summary: Daily contact with patient to assess and evaluate symptoms and progress in treatment, Medication management and Plan dementia Alzheimers type with behavioral disturbance:  -Continue Aricept 10 mg at bedtime -Continue Namenda 10 mg BID -Discontinue Wellbutrin 150 mg daily, started a few days ago -Start Risperdal 0.25 mg BID for mood and agitation  Disposition: Recommend psychiatric Inpatient admission when medically cleared.  Waylan Boga, NP 10/25/2018 10:07 AM  Patient seen face-to-face for psychiatric evaluation, chart reviewed and case discussed with the physician extender and developed treatment plan. Reviewed the information documented and agree with the treatment plan. Corena Pilgrim, MD

## 2018-10-25 NOTE — ED Notes (Signed)
Patient denies pain and is resting comfortably.  

## 2018-10-25 NOTE — ED Notes (Signed)
Devin Vance, Diplomatic Services operational officer Nurse educated spouse on the patient transitioning to [employer]. Spouse was given the guidelines to the unit. Spouse noted she will take the following items: ID([place]), watch, papers, Key (fob), a 2 dollar bill, and one dollar home with her. Spouse is upset that she can not stay the night with her husband.

## 2018-10-25 NOTE — Progress Notes (Signed)
CSW called to check on the status of referrals that were sent with the following results:  Still reviewing: [facility] (no one in intake until Monday, 10/27/2018 at which referral would be reviewed) [facility]  TTS will continue to seek placement.  Chalmers Guest. Guerry Bruin, MSW, [place] Work/Disposition Phone: [phone] Fax: [phone]

## 2018-10-25 NOTE — ED Provider Notes (Signed)
Patient has escalated is now not following directions to stay here and remain in his room.  He requires constant presence by security to keep him in his room.  He removed his paper close, and put on his regular clothes, and is calling a taxi at this time.  I discussed the situation with his wife was in the room at the bedside.  The patient has become increasingly confused, and not benefit from outpatient treatment prescribed by his neurologist for confusion including 2 different types of antidepressant medications.  He has threatened to kill himself using a knife by cutting his arteries.  His wife is worried that he will kill himself.  He lives with him at a independent facility in [place].  I have completed involuntary committment papers and first opinion for confinement.  Patient has been seen by TTS who have ordered sedating medication and plans on to have admitted to a geriatric psychiatric unit.   Daleen Bo, MD 10/25/18 1446

## 2018-10-25 NOTE — Progress Notes (Signed)
RT called to bedside to setup Pt's home CPAP machine.  Machine inspected for damages/defects, none were found.  Pt not ready ro go on CPAP at this time, Pt's wife aware and at bedside.  RT to monitor and assess as needed.

## 2018-10-25 NOTE — Progress Notes (Addendum)
Pt noted to be very confused with increased verbal and physical aggression towards nursing staff, ED security and [employer] when redirection was attempted. Pt continued to escalate in behavior; pushed staff, put on his clothes, demanding to leave the ED asap immediately. Pt unable to be calm at this time. Assigned provider notified. New order received for Benadryl 50 mg IM and Haldol 10 mg IM given as ordered. Safety maintained. Assigned 1:1 staff in attendance at all time.

## 2018-10-26 DIAGNOSIS — F0281 Dementia in other diseases classified elsewhere with behavioral disturbance: Secondary | ICD-10-CM | POA: Diagnosis not present

## 2018-10-26 DIAGNOSIS — G309 Alzheimer's disease, unspecified: Secondary | ICD-10-CM | POA: Diagnosis not present

## 2018-10-26 DIAGNOSIS — G300 Alzheimer's disease with early onset: Secondary | ICD-10-CM | POA: Diagnosis not present

## 2018-10-26 LAB — URINE CULTURE: Culture: NO GROWTH

## 2018-10-26 MED ORDER — DIPHENHYDRAMINE HCL 50 MG/ML IJ SOLN
50.0000 mg | Freq: Once | INTRAMUSCULAR | Status: DC
  Filled 2018-10-26: qty 1

## 2018-10-26 MED ORDER — DIPHENHYDRAMINE HCL 50 MG/ML IJ SOLN
50.0000 mg | Freq: Once | INTRAMUSCULAR | Status: AC
  Administered 2018-10-26: 50 mg via INTRAMUSCULAR

## 2018-10-26 MED ORDER — RISPERIDONE 0.5 MG PO TABS
0.5000 mg | ORAL_TABLET | Freq: Two times a day (BID) | ORAL | Status: DC
  Administered 2018-10-26 – 2018-10-27 (×3): 0.5 mg via ORAL
  Filled 2018-10-26 (×3): qty 1

## 2018-10-26 MED ORDER — DIVALPROEX SODIUM 250 MG PO DR TAB
250.0000 mg | DELAYED_RELEASE_TABLET | Freq: Two times a day (BID) | ORAL | Status: DC
  Administered 2018-10-26 – 2018-10-27 (×3): 250 mg via ORAL
  Filled 2018-10-26 (×3): qty 1

## 2018-10-26 MED ORDER — OLANZAPINE 10 MG IM SOLR
10.0000 mg | Freq: Once | INTRAMUSCULAR | Status: AC
  Administered 2018-10-26: 10 mg via INTRAMUSCULAR
  Filled 2018-10-26: qty 10

## 2018-10-26 MED ORDER — ENSURE ENLIVE PO LIQD
237.0000 mL | Freq: Two times a day (BID) | ORAL | Status: DC
  Administered 2018-10-27: 237 mL via ORAL
  Filled 2018-10-26 (×2): qty 237

## 2018-10-26 NOTE — Progress Notes (Signed)
Pt refusing CPAP QHS at this time.  RT to monitor and assess as needed.

## 2018-10-26 NOTE — ED Notes (Signed)
Patient calm, cooperative, ambulatory to bathroom with NT assist.

## 2018-10-26 NOTE — ED Notes (Signed)
Wife and daughter at bedside per charge nurse Chong Sicilian

## 2018-10-26 NOTE — Progress Notes (Signed)
Patient meets criteria for inpatient treatment. No appropriate or available beds at [facility]. CSW faxed referrals to the following facilities for review:  [facility], [facility], [place], [facility], [place] [facility], [facility], [facility], [facility], [facility].  Pt remains under review at: [facility] (review on Monday 10/27/18) and [facility].  TTS will continue to seek bed placement.  Chalmers Guest. Guerry Bruin, MSW, [place] Work/Disposition Phone: [phone] Fax: [phone]

## 2018-10-26 NOTE — Consult Note (Addendum)
[facility] Face-to-Face Psychiatry Consult   Reason for Consult:  Agitation with suicide threats Referring Physician:  EDP Patient Identification: Devin Vance MRN:  [id number] Principal Diagnosis: Alzheimer's dementia with behavioral disturbance [facility]) Diagnosis:  Principal Problem:   Alzheimer's dementia with behavioral disturbance ([place])  Total Time spent with patient: 30 minutes  Subjective:   Devin Vance is a 85 y.o. male patient admitted with behavior issues and agitation.  HPI:  On admission:  85 yo male who had an altercation with his wife and threatened suicide.  He minimizes his actions because his wife would not talk to him and he said if he threatened to hurt himself would she change her mind.  Calm and cooperative on assessment, rambles.  Denies suicidal/homicidal ideations, hallucinations, and substance abuse.  He is agreeable to start medications for his agitation.  Agitation increases at night, decreases in the morning.  None on assessment, denies depression.  Expresses himself well.  The patient has been agitated sporadically, requiring PRN medications.  Agitated yesterday when his wife came and couldn't leave and also agitated last night.  Some agitation this morning but redirected.  Medications were started and adjusted today.  Geriatric psychiatric placement being sought.  Past Psychiatric History: dementia  Risk to Self: Suicidal Ideation: Yes-Currently Present Suicidal Intent: No Is patient at risk for suicide?: Yes Suicidal Plan?: Yes-Currently Present Specify Current Suicidal Plan: Threatened to cut hisself or wife with his pocket knife Access to Means: Yes Specify Access to Suicidal Means: Pt has pocket knife and refused to give it up. What has been your use of drugs/alcohol within the last 12 months?: None Intentional Self Injurious Behavior: None Risk to Others: Homicidal Ideation: No Thoughts of Harm to Others: Yes-Currently Present Comment - Thoughts of Harm to  Others: Pt threatened to cut wife if they didn't quite arguing.  Current Homicidal Intent: No Current Homicidal Plan: Yes-Currently Present History of harm to others?: No Assessment of Violence: None Noted Does patient have access to weapons?: Yes (Comment) Criminal Charges Pending?: No Does patient have a court date: No Prior Inpatient Therapy: Prior Inpatient Therapy: No Prior Outpatient Therapy: Prior Outpatient Therapy: No  Past Medical History:  Past Medical History:  Diagnosis Date  . Alzheimer disease ([place]) 07/31/2018  . Angina pectoris ([place])   . Arthritis   . Cardiomyopathy ([place])   . COPD (chronic obstructive pulmonary disease) ([place])   . Gastroesophageal reflux disease   . History of colon polyps   . Hyperlipidemia   . Hypertension   . Obesity   . Osteoporosis   . Polymyalgia rheumatica ([place])   . Prostate cancer ([place])   . Rheumatoid arthritis ([place])   . Sleep apnea     Past Surgical History:  Procedure Laterality Date  . CATARACT EXTRACTION    . CORONARY ANGIOPLASTY    . HEMORRHOIDECTOMY WITH HEMORRHOID BANDING    . PROSTATECTOMY    . STRABISMUS SURGERY    . TONSILLECTOMY     Family History:  Family History  Problem Relation Age of Onset  . Stroke Mother   . Dementia Mother   . Cancer Father   . Dementia Sister   . Dementia Maternal Aunt    Family Psychiatric  History: see above Social History:  Social History   Substance and Sexual Activity  Alcohol Use Not Currently     Social History   Substance and Sexual Activity  Drug Use Not Currently    Social History   Socioeconomic History  .  Marital status: Married    Spouse name: Not on file  . Number of children: Not on file  . Years of education: Not on file  . Highest education level: Not on file  Occupational History  . Not on file  Social Needs  . Financial resource strain: Not on file  . Food insecurity:    Worry: Not on file    Inability: Not on file  . Transportation needs:     Medical: Not on file    Non-medical: Not on file  Tobacco Use  . Smoking status: Never Smoker  . Smokeless tobacco: Never Used  Substance and Sexual Activity  . Alcohol use: Not Currently  . Drug use: Not Currently  . Sexual activity: Not on file  Lifestyle  . Physical activity:    Days per week: Not on file    Minutes per session: Not on file  . Stress: Not on file  Relationships  . Social connections:    Talks on phone: Not on file    Gets together: Not on file    Attends religious service: Not on file    Active member of club or organization: Not on file    Attends meetings of clubs or organizations: Not on file    Relationship status: Not on file  Other Topics Concern  . Not on file  Social History Narrative  . Not on file   Additional Social History:    Allergies:   Allergies  Allergen Reactions  . Lorazepam Other (See Comments)    Generalized weakness    Labs:  Results for orders placed or performed during the hospital encounter of 10/24/18 (from the past 48 hour(s))  Comprehensive metabolic panel     Status: Abnormal   Collection Time: 10/24/18  8:18 PM  Result Value Ref Range   Sodium 138 135 - 145 mmol/L   Potassium 3.9 3.5 - 5.1 mmol/L   Chloride 103 98 - 111 mmol/L   CO2 26 22 - 32 mmol/L   Glucose, Bld 134 (H) 70 - 99 mg/dL   BUN 31 (H) 8 - 23 mg/dL   Creatinine, Ser 1.62 (H) 0.61 - 1.24 mg/dL   Calcium 8.7 (L) 8.9 - 10.3 mg/dL   Total Protein 6.3 (L) 6.5 - 8.1 g/dL   Albumin 3.8 3.5 - 5.0 g/dL   AST 23 15 - 41 U/L   ALT 28 0 - 44 U/L   Alkaline Phosphatase 43 38 - 126 U/L   Total Bilirubin 0.5 0.3 - 1.2 mg/dL   GFR calc non Af Amer 37 (L) >60 mL/min   GFR calc Af Amer 43 (L) >60 mL/min   Anion gap 9 5 - 15    Comment: Performed at [facility], [place] [address] [place], [place] [address]  Ethanol     Status: None   Collection Time: 10/24/18  8:18 PM  Result Value Ref Range   Alcohol, Ethyl (B) <10 <10 mg/dL    Comment:  (NOTE) Lowest detectable limit for serum alcohol is 10 mg/dL. For medical purposes only. Performed at [facility], [place] [address] [place], [place] [address]   Salicylate level     Status: None   Collection Time: 10/24/18  8:18 PM  Result Value Ref Range   Salicylate Lvl <3.6 2.8 - 30.0 mg/dL    Comment: Performed at [facility], [place] [address] [place], [place] [address]  Acetaminophen level     Status: Abnormal  Collection Time: 10/24/18  8:18 PM  Result Value Ref Range   Acetaminophen (Tylenol), Serum <10 (L) 10 - 30 ug/mL    Comment: (NOTE) Therapeutic concentrations vary significantly. A range of 10-30 ug/mL  may be an effective concentration for many patients. However, some  are best treated at concentrations outside of this range. Acetaminophen concentrations >150 ug/mL at 4 hours after ingestion  and >50 ug/mL at 12 hours after ingestion are often associated with  toxic reactions. Performed at [facility], [place] [address], [place], [place] [address]   cbc     Status: Abnormal   Collection Time: 10/24/18  8:18 PM  Result Value Ref Range   WBC 9.5 4.0 - 10.5 K/uL   RBC 3.76 (L) 4.22 - 5.81 MIL/uL   Hemoglobin 11.4 (L) 13.0 - 17.0 g/dL   HCT 36.4 (L) 39.0 - 52.0 %   MCV 96.8 80.0 - 100.0 fL   MCH 30.3 26.0 - 34.0 pg   MCHC 31.3 30.0 - 36.0 g/dL   RDW 13.8 11.5 - 15.5 %   Platelets 205 150 - 400 K/uL   nRBC 0.0 0.0 - 0.2 %    Comment: Performed at [facility], [place] [address] [place], [place] [address]  Rapid urine drug screen (hospital performed)     Status: None   Collection Time: 10/24/18 10:08 PM  Result Value Ref Range   Opiates NONE DETECTED NONE DETECTED   Cocaine NONE DETECTED NONE DETECTED   Benzodiazepines NONE DETECTED NONE DETECTED   Amphetamines NONE DETECTED NONE DETECTED   Tetrahydrocannabinol NONE DETECTED NONE DETECTED   Barbiturates NONE DETECTED NONE DETECTED     Comment: (NOTE) DRUG SCREEN FOR MEDICAL PURPOSES ONLY.  IF CONFIRMATION IS NEEDED FOR ANY PURPOSE, NOTIFY LAB WITHIN 5 DAYS. LOWEST DETECTABLE LIMITS FOR URINE DRUG SCREEN Drug Class                     Cutoff (ng/mL) Amphetamine and metabolites    1000 Barbiturate and metabolites    200 Benzodiazepine                 093 Tricyclics and metabolites     300 Opiates and metabolites        300 Cocaine and metabolites        300 THC                            50 Performed at [facility], [place] [address] [place], [place] [address]   Urinalysis, Routine w reflex microscopic     Status: Abnormal   Collection Time: 10/24/18 10:08 PM  Result Value Ref Range   Color, Urine YELLOW YELLOW   APPearance CLEAR CLEAR   Specific Gravity, Urine 1.017 1.005 - 1.030   pH 5.0 5.0 - 8.0   Glucose, UA NEGATIVE NEGATIVE mg/dL   Hgb urine dipstick NEGATIVE NEGATIVE   Bilirubin Urine NEGATIVE NEGATIVE   Ketones, ur 5 (A) NEGATIVE mg/dL   Protein, ur NEGATIVE NEGATIVE mg/dL   Nitrite NEGATIVE NEGATIVE   Leukocytes, UA NEGATIVE NEGATIVE    Comment: Performed at [place] [address], [place], [place] [address]  Urine culture     Status: None   Collection Time: 10/24/18 10:08 PM  Result Value Ref Range   Specimen Description URINE, RANDOM    Special Requests      NONE Performed at [facility]  [place] [address] [place], [place] [address]    Culture NO GROWTH    Report Status 10/26/2018 FINAL     Current Facility-Administered Medications  Medication Dose Route Frequency Provider Last Rate Last Dose  . divalproex (DEPAKOTE) DR tablet 250 mg  250 mg Oral Q12H Patrecia Pour, NP   250 mg at 10/26/18 1017  . donepezil (ARICEPT) tablet 10 mg  10 mg Oral QHS Tegeler, Gwenyth Allegra, MD   10 mg at 10/25/18 2219  . hydroxychloroquine (PLAQUENIL) tablet 400 mg  400 mg Oral q morning - 10a Tegeler, Gwenyth Allegra, MD   400 mg at 10/26/18 1018   . memantine (NAMENDA) tablet 10 mg  10 mg Oral BID Tegeler, Gwenyth Allegra, MD   10 mg at 10/26/18 1017  . metoprolol succinate (TOPROL-XL) 24 hr tablet 25 mg  25 mg Oral Daily Tegeler, Gwenyth Allegra, MD   25 mg at 10/26/18 1017  . predniSONE (DELTASONE) tablet 2.5 mg  2.5 mg Oral q morning - 10a Tegeler, Gwenyth Allegra, MD   2.5 mg at 10/26/18 1017  . ramipril (ALTACE) capsule 5 mg  5 mg Oral q morning - 10a Tegeler, Gwenyth Allegra, MD   5 mg at 10/26/18 1017  . risperiDONE (RISPERDAL) tablet 0.5 mg  0.5 mg Oral BID Patrecia Pour, NP   0.5 mg at 10/26/18 1017  . tamsulosin (FLOMAX) capsule 0.8 mg  0.8 mg Oral QPM Tegeler, Gwenyth Allegra, MD   0.8 mg at 10/25/18 2226   Current Outpatient Medications  Medication Sig Dispense Refill  . aspirin EC 81 MG tablet Take by mouth.    Marland Kitchen buPROPion (WELLBUTRIN XL) 150 MG 24 hr tablet Take 1 tablet (150 mg total) by mouth daily. (Patient taking differently: Take 150 mg by mouth every morning. ) 30 tablet 2  . Calcium Carbonate-Vitamin D 600-400 MG-UNIT tablet Take by mouth.    . Cholecalciferol (VITAMIN D-3) 125 MCG (5000 UT) TABS Take 1 tablet by mouth every morning.    . donepezil (ARICEPT) 10 MG tablet Take 1 tablet (10 mg total) by mouth at bedtime. 30 tablet 3  . hydroxychloroquine (PLAQUENIL) 200 MG tablet Take 400 mg by mouth every morning.    . Lactobacillus (PROBIOTIC ACIDOPHILUS PO) Take 1 tablet by mouth daily.    . Lutein 20 MG CAPS Take by mouth.    . memantine (NAMENDA) 10 MG tablet Take 1 tablet (10 mg total) by mouth 2 (two) times daily. 180 tablet 3  . metoprolol succinate (TOPROL-XL) 25 MG 24 hr tablet Take by mouth.    . Multiple Vitamin (MULTIVITAMIN) capsule Take by mouth.    . predniSONE (DELTASONE) 2.5 MG tablet Take 2.5 mg by mouth every morning.    . ramipril (ALTACE) 5 MG capsule Take 5 mg by mouth every morning.     . tamsulosin (FLOMAX) 0.4 MG CAPS capsule Take 0.8 mg by mouth every evening.      Musculoskeletal: Strength &  Muscle Tone: within normal limits Gait & Station: normal Patient leans: N/A  Psychiatric Specialty Exam: Physical Exam  Nursing note and vitals reviewed. Constitutional: He appears well-developed and well-nourished.  HENT:  Head: Normocephalic.  Neck: Normal range of motion.  Respiratory: Effort normal.  Musculoskeletal: Normal range of motion.  Neurological: He is alert.  Psychiatric: His speech is normal. Thought content normal. His mood appears anxious. His affect is blunt. Cognition and memory are impaired. He expresses impulsivity. He is inattentive.  Review of Systems  Psychiatric/Behavioral: Positive for memory loss. The patient is nervous/anxious.   All other systems reviewed and are negative.   Blood pressure 127/66, pulse 67, temperature (!) 97.5 F (36.4 C), temperature source Oral, resp. rate 18, SpO2 97 %.There is no height or weight on file to calculate BMI.  General Appearance: Casual  Eye Contact:  Good  Speech:  Normal Rate  Volume:  Normal  Mood:  Anxious  Affect:  Blunt  Thought Process:  Coherent and Descriptions of Associations: Intact  Orientation:  Other:  person, place  Thought Content:  rambles   Suicidal Thoughts:  Yes.  without intent/plan  Homicidal Thoughts:  No  Memory:  Immediate;   Fair Recent;   Fair Remote;   Fair  Judgement:  Poor  Insight:  Lacking  Psychomotor Activity:  Normal  Concentration:  Concentration: Fair and Attention Span: Fair  Recall:  [place] of Knowledge:  Good  Language:  Good  Akathisia:  No  Handed:  Right  AIMS (if indicated):     Assets:  Housing Leisure Time Resilience Social Support  ADL's:  Intact  Cognition:  Impaired,  Mild  Sleep:        Treatment Plan Summary: Daily contact with patient to assess and evaluate symptoms and progress in treatment, Medication management and Plan dementia Alzheimers type with behavioral disturbance:  -Continue Aricept 10 mg at bedtime -Continue Namenda 10 mg  BID -Started Depakote 250 mg BID -Increased Risperdal 0.25 mg to 0.5 mg BID for mood and agitation  Disposition: Recommend psychiatric Inpatient admission when medically cleared.  Waylan Boga, NP 10/26/2018 11:01 AM  Patient seen face-to-face for psychiatric evaluation, chart reviewed and case discussed with the physician extender and developed treatment plan. Reviewed the information documented and agree with the treatment plan. Corena Pilgrim, MD

## 2018-10-26 NOTE — ED Notes (Signed)
Pt awake, alert & responsive, no distress noted, anxious at present.  Family at bedside.  Pt stating he wants to go home.  Sitter at bedside.  Monitoring for safety.

## 2018-10-26 NOTE — ED Notes (Signed)
Pt sitting up in chair

## 2018-10-26 NOTE — ED Notes (Signed)
Staff use verbal de-escalating and redirecting which was unsuccessful. Notified MD unpredictable, s/s sundowners eventful behaviors. Followed MD's ordered and will continue to monitor/maintain safety.

## 2018-10-26 NOTE — ED Notes (Addendum)
Patient's family out at desk requesting that the patient no be sent anywhere but Devin Vance.

## 2018-10-26 NOTE — ED Notes (Signed)
Awaken with agitation, used verbal de-escalation and redirecting. Able to redirect

## 2018-10-27 ENCOUNTER — Emergency Department (HOSPITAL_COMMUNITY): Payer: Medicare Other

## 2018-10-27 DIAGNOSIS — Z955 Presence of coronary angioplasty implant and graft: Secondary | ICD-10-CM | POA: Diagnosis not present

## 2018-10-27 DIAGNOSIS — G309 Alzheimer's disease, unspecified: Secondary | ICD-10-CM | POA: Diagnosis not present

## 2018-10-27 DIAGNOSIS — F0391 Unspecified dementia with behavioral disturbance: Secondary | ICD-10-CM | POA: Diagnosis present

## 2018-10-27 DIAGNOSIS — M353 Polymyalgia rheumatica: Secondary | ICD-10-CM | POA: Diagnosis present

## 2018-10-27 DIAGNOSIS — R45851 Suicidal ideations: Secondary | ICD-10-CM | POA: Diagnosis not present

## 2018-10-27 DIAGNOSIS — M069 Rheumatoid arthritis, unspecified: Secondary | ICD-10-CM | POA: Diagnosis present

## 2018-10-27 DIAGNOSIS — F0280 Dementia in other diseases classified elsewhere without behavioral disturbance: Secondary | ICD-10-CM | POA: Diagnosis not present

## 2018-10-27 DIAGNOSIS — N179 Acute kidney failure, unspecified: Secondary | ICD-10-CM | POA: Diagnosis present

## 2018-10-27 DIAGNOSIS — E669 Obesity, unspecified: Secondary | ICD-10-CM | POA: Diagnosis present

## 2018-10-27 DIAGNOSIS — Z0289 Encounter for other administrative examinations: Secondary | ICD-10-CM | POA: Diagnosis not present

## 2018-10-27 DIAGNOSIS — I10 Essential (primary) hypertension: Secondary | ICD-10-CM | POA: Diagnosis not present

## 2018-10-27 DIAGNOSIS — I429 Cardiomyopathy, unspecified: Secondary | ICD-10-CM | POA: Diagnosis present

## 2018-10-27 DIAGNOSIS — F0281 Dementia in other diseases classified elsewhere with behavioral disturbance: Secondary | ICD-10-CM | POA: Diagnosis not present

## 2018-10-27 DIAGNOSIS — I2510 Atherosclerotic heart disease of native coronary artery without angina pectoris: Secondary | ICD-10-CM | POA: Diagnosis present

## 2018-10-27 DIAGNOSIS — Z888 Allergy status to other drugs, medicaments and biological substances status: Secondary | ICD-10-CM | POA: Diagnosis not present

## 2018-10-27 DIAGNOSIS — Z6826 Body mass index (BMI) 26.0-26.9, adult: Secondary | ICD-10-CM | POA: Diagnosis not present

## 2018-10-27 DIAGNOSIS — Z7982 Long term (current) use of aspirin: Secondary | ICD-10-CM | POA: Diagnosis not present

## 2018-10-27 DIAGNOSIS — Z9119 Patient's noncompliance with other medical treatment and regimen: Secondary | ICD-10-CM | POA: Diagnosis not present

## 2018-10-27 DIAGNOSIS — Z046 Encounter for general psychiatric examination, requested by authority: Secondary | ICD-10-CM | POA: Diagnosis not present

## 2018-10-27 DIAGNOSIS — F29 Unspecified psychosis not due to a substance or known physiological condition: Secondary | ICD-10-CM | POA: Diagnosis not present

## 2018-10-27 DIAGNOSIS — G308 Other Alzheimer's disease: Secondary | ICD-10-CM

## 2018-10-27 DIAGNOSIS — M810 Age-related osteoporosis without current pathological fracture: Secondary | ICD-10-CM | POA: Diagnosis present

## 2018-10-27 DIAGNOSIS — N183 Chronic kidney disease, stage 3 (moderate): Secondary | ICD-10-CM | POA: Diagnosis present

## 2018-10-27 DIAGNOSIS — I129 Hypertensive chronic kidney disease with stage 1 through stage 4 chronic kidney disease, or unspecified chronic kidney disease: Secondary | ICD-10-CM | POA: Diagnosis present

## 2018-10-27 DIAGNOSIS — G4733 Obstructive sleep apnea (adult) (pediatric): Secondary | ICD-10-CM | POA: Diagnosis present

## 2018-10-27 DIAGNOSIS — Z515 Encounter for palliative care: Secondary | ICD-10-CM | POA: Diagnosis not present

## 2018-10-27 DIAGNOSIS — E785 Hyperlipidemia, unspecified: Secondary | ICD-10-CM | POA: Diagnosis present

## 2018-10-27 DIAGNOSIS — Z79899 Other long term (current) drug therapy: Secondary | ICD-10-CM | POA: Diagnosis not present

## 2018-10-27 DIAGNOSIS — K219 Gastro-esophageal reflux disease without esophagitis: Secondary | ICD-10-CM | POA: Diagnosis present

## 2018-10-27 DIAGNOSIS — F05 Delirium due to known physiological condition: Secondary | ICD-10-CM | POA: Diagnosis present

## 2018-10-27 DIAGNOSIS — J449 Chronic obstructive pulmonary disease, unspecified: Secondary | ICD-10-CM | POA: Diagnosis present

## 2018-10-27 DIAGNOSIS — F03918 Unspecified dementia, unspecified severity, with other behavioral disturbance: Secondary | ICD-10-CM | POA: Insufficient documentation

## 2018-10-27 DIAGNOSIS — Z9989 Dependence on other enabling machines and devices: Secondary | ICD-10-CM | POA: Diagnosis not present

## 2018-10-27 NOTE — ED Notes (Signed)
HCPOA LIVING WILL  ORIGINAL PAPERS GIVEN TO TOM [facility]

## 2018-10-27 NOTE — Consult Note (Addendum)
Devin Vance   Reason for Vance:  Agitation with suicide threats Referring Physician:  EDP Patient Identification: Devin Vance MRN:  [id number] Principal Diagnosis: Alzheimer's dementia with behavioral disturbance [facility]) Diagnosis:  Principal Problem:   Alzheimer's dementia with behavioral disturbance ([place])  Total Time spent with patient: 30 minutes  Subjective:   Devin Vance is a 85 y.o. male patient admitted with behavior issues and agitation.  HPI:  On admission:  85 yo male who had an altercation with his wife and threatened suicide.  He minimizes his actions because his wife would not talk to him and he said if he threatened to hurt himself would she change her mind.  Calm and cooperative on assessment, rambles.  Denies suicidal/homicidal ideations, hallucinations, and substance abuse.  He is agreeable to start medications for his agitation.  Agitation increases at night, decreases in the morning.  None on assessment, denies depression.  Expresses himself well.  The patient has been calmer today but remains labile and continues to be unsafe to return to his assisted living facility, geriatric psychiatry being sought  Past Psychiatric History: dementia  Risk to Self: Suicidal Ideation: Yes-Currently Present Suicidal Intent: No Is patient at risk for suicide?: Yes Suicidal Plan?: Yes-Currently Present Specify Current Suicidal Plan: Threatened to cut hisself or wife with his pocket knife Access to Means: Yes Specify Access to Suicidal Means: Pt has pocket knife and refused to give it up. What has been your use of drugs/alcohol within the last 12 months?: None Intentional Self Injurious Behavior: None Risk to Others: Homicidal Ideation: No Thoughts of Harm to Others: Yes-Currently Present Comment - Thoughts of Harm to Others: Pt threatened to cut wife if they didn't quite arguing.  Current Homicidal Intent: No Current Homicidal Plan: Yes-Currently  Present History of harm to others?: No Assessment of Violence: None Noted Does patient have access to weapons?: Yes (Comment) Criminal Charges Pending?: No Does patient have a court date: No Prior Inpatient Therapy: Prior Inpatient Therapy: No Prior Outpatient Therapy: Prior Outpatient Therapy: No  Past Medical History:  Past Medical History:  Diagnosis Date  . Alzheimer disease ([place]) 07/31/2018  . Angina pectoris ([place])   . Arthritis   . Cardiomyopathy ([place])   . COPD (chronic obstructive pulmonary disease) ([place])   . Gastroesophageal reflux disease   . History of colon polyps   . Hyperlipidemia   . Hypertension   . Obesity   . Osteoporosis   . Polymyalgia rheumatica ([place])   . Prostate cancer ([place])   . Rheumatoid arthritis ([place])   . Sleep apnea     Past Surgical History:  Procedure Laterality Date  . CATARACT EXTRACTION    . CORONARY ANGIOPLASTY    . HEMORRHOIDECTOMY WITH HEMORRHOID BANDING    . PROSTATECTOMY    . STRABISMUS SURGERY    . TONSILLECTOMY     Family History:  Family History  Problem Relation Age of Onset  . Stroke Mother   . Dementia Mother   . Cancer Father   . Dementia Sister   . Dementia Maternal Aunt    Family Psychiatric  History: See above Social History:  Social History   Substance and Sexual Activity  Alcohol Use Not Currently     Social History   Substance and Sexual Activity  Drug Use Not Currently    Social History   Socioeconomic History  . Marital status: Married    Spouse name: Not on file  . Number of children: Not on  file  . Years of education: Not on file  . Highest education level: Not on file  Occupational History  . Not on file  Social Needs  . Financial resource strain: Not on file  . Food insecurity:    Worry: Not on file    Inability: Not on file  . Transportation needs:    Medical: Not on file    Non-medical: Not on file  Tobacco Use  . Smoking status: Never Smoker  . Smokeless tobacco: Never Used   Substance and Sexual Activity  . Alcohol use: Not Currently  . Drug use: Not Currently  . Sexual activity: Not on file  Lifestyle  . Physical activity:    Days per week: Not on file    Minutes per session: Not on file  . Stress: Not on file  Relationships  . Social connections:    Talks on phone: Not on file    Gets together: Not on file    Attends religious service: Not on file    Active member of club or organization: Not on file    Attends meetings of clubs or organizations: Not on file    Relationship status: Not on file  Other Topics Concern  . Not on file  Social History Narrative  . Not on file   Additional Social History: N/A    Allergies:   Allergies  Allergen Reactions  . Lorazepam Other (See Comments)    Generalized weakness    Labs:  No results found for this or any previous visit (from the past 48 hour(s)).  Current Facility-Administered Medications  Medication Dose Route Frequency Provider Last Rate Last Dose  . divalproex (DEPAKOTE) DR tablet 250 mg  250 mg Oral Q12H Patrecia Pour, NP   250 mg at 10/27/18 1031  . donepezil (ARICEPT) tablet 10 mg  10 mg Oral QHS Tegeler, Gwenyth Allegra, MD   10 mg at 10/26/18 2018  . feeding supplement (ENSURE ENLIVE) (ENSURE ENLIVE) liquid 237 mL  237 mL Oral BID BM Lacretia Leigh, MD   237 mL at 10/27/18 1035  . hydroxychloroquine (PLAQUENIL) tablet 400 mg  400 mg Oral q morning - 10a Tegeler, Gwenyth Allegra, MD   400 mg at 10/27/18 1033  . memantine (NAMENDA) tablet 10 mg  10 mg Oral BID Tegeler, Gwenyth Allegra, MD   10 mg at 10/27/18 1032  . metoprolol succinate (TOPROL-XL) 24 hr tablet 25 mg  25 mg Oral Daily Tegeler, Gwenyth Allegra, MD   25 mg at 10/27/18 1032  . predniSONE (DELTASONE) tablet 2.5 mg  2.5 mg Oral q morning - 10a Tegeler, Gwenyth Allegra, MD   2.5 mg at 10/27/18 1033  . ramipril (ALTACE) capsule 5 mg  5 mg Oral q morning - 10a Tegeler, Gwenyth Allegra, MD   5 mg at 10/27/18 1033  . risperiDONE (RISPERDAL)  tablet 0.5 mg  0.5 mg Oral BID Patrecia Pour, NP   0.5 mg at 10/27/18 1032  . tamsulosin (FLOMAX) capsule 0.8 mg  0.8 mg Oral QPM Tegeler, Gwenyth Allegra, MD   0.8 mg at 10/26/18 1750   Current Outpatient Medications  Medication Sig Dispense Refill  . aspirin EC 81 MG tablet Take by mouth.    Marland Kitchen buPROPion (WELLBUTRIN XL) 150 MG 24 hr tablet Take 1 tablet (150 mg total) by mouth daily. (Patient taking differently: Take 150 mg by mouth every morning. ) 30 tablet 2  . Calcium Carbonate-Vitamin D 600-400 MG-UNIT tablet Take by mouth.    Marland Kitchen  Cholecalciferol (VITAMIN D-3) 125 MCG (5000 UT) TABS Take 1 tablet by mouth every morning.    . donepezil (ARICEPT) 10 MG tablet Take 1 tablet (10 mg total) by mouth at bedtime. 30 tablet 3  . hydroxychloroquine (PLAQUENIL) 200 MG tablet Take 400 mg by mouth every morning.    . Lactobacillus (PROBIOTIC ACIDOPHILUS PO) Take 1 tablet by mouth daily.    . Lutein 20 MG CAPS Take by mouth.    . memantine (NAMENDA) 10 MG tablet Take 1 tablet (10 mg total) by mouth 2 (two) times daily. 180 tablet 3  . metoprolol succinate (TOPROL-XL) 25 MG 24 hr tablet Take by mouth.    . Multiple Vitamin (MULTIVITAMIN) capsule Take by mouth.    . predniSONE (DELTASONE) 2.5 MG tablet Take 2.5 mg by mouth every morning.    . ramipril (ALTACE) 5 MG capsule Take 5 mg by mouth every morning.     . tamsulosin (FLOMAX) 0.4 MG CAPS capsule Take 0.8 mg by mouth every evening.      Musculoskeletal: Strength & Muscle Tone: within normal limits Gait & Station: normal Patient leans: N/A  Psychiatric Specialty Exam: Physical Exam  Nursing note and vitals reviewed. Constitutional: He appears well-developed and well-nourished.  HENT:  Head: Normocephalic.  Neck: Normal range of motion.  Respiratory: Effort normal.  Musculoskeletal: Normal range of motion.  Neurological: He is alert.  Psychiatric: His speech is normal. Thought content normal. His mood appears anxious. His affect is blunt.  Cognition and memory are impaired. He expresses impulsivity. He is inattentive.    Review of Systems  Psychiatric/Behavioral: Positive for memory loss. The patient is nervous/anxious.   All other systems reviewed and are negative.   Blood pressure 119/64, pulse (!) 58, temperature 97.6 F (36.4 C), temperature source Oral, resp. rate 16, SpO2 100 %.There is no height or weight on file to calculate BMI.  General Appearance: Casual  Eye Contact:  Good  Speech:  Normal Rate  Volume:  Normal  Mood:  Anxious  Affect:  Blunt  Thought Process:  Coherent and Descriptions of Associations: Intact  Orientation:  Other:  person, place  Thought Content:  rambles   Suicidal Thoughts:  Yes.  without intent/plan  Homicidal Thoughts:  No  Memory:  Immediate;   Fair Recent;   Fair Remote;   Fair  Judgement:  Poor  Insight:  Lacking  Psychomotor Activity:  Normal  Concentration:  Concentration: Fair and Attention Span: Fair  Recall:  [place] of Knowledge:  Good  Language:  Good  Akathisia:  No  Handed:  Right  AIMS (if indicated):   N/A  Assets:  Housing Leisure Time Resilience Social Support  ADL's:  Intact  Cognition:  Impaired,  Mild  Sleep:   N/A     Treatment Plan Summary: Daily contact with patient to assess and evaluate symptoms and progress in treatment, Medication management and Plan dementia Alzheimers type with behavioral disturbance:  -Continue Aricept 10 mg at bedtime for dementia.  -Continue Namenda 10 mg BID for dementia.  -Continued Depakote 250 mg BID for mood stabilization. -Continued Risperdal 0.5 mg BID for mood and agitation.  Disposition: Recommend psychiatric Inpatient admission when medically cleared.  Waylan Boga, NP 10/27/2018 10:38 AM    Patient seen face-to-face for psychiatric evaluation, chart reviewed and case discussed with the physician extender and developed treatment plan. Reviewed the information documented and agree with the treatment  plan.  Buford Dresser, DO 10/27/18 12:43 PM

## 2018-10-27 NOTE — ED Notes (Signed)
RETURNED FROM X-RAY

## 2018-10-27 NOTE — ED Notes (Signed)
Report to RN [place].

## 2018-10-27 NOTE — ED Notes (Signed)
[place] WITH FAMILY REGARDING PLACEMENT OF PATIENT

## 2018-10-27 NOTE — ED Notes (Signed)
[facility] DE[facility] HERE FOR TRANSFER

## 2018-10-27 NOTE — ED Notes (Signed)
ORIGINAL PAPERWORK HAS BEEN RETURNED TO FAMILY

## 2018-10-27 NOTE — ED Notes (Signed)
PATIENT REMAINS IN CARDIAC CHAIR. [facility] REGISTRATION GIVEN ORIGINAL HCPOA AND LIVING WILL PAPERWORK FOR MAKING COPIES

## 2018-10-27 NOTE — ED Notes (Signed)
PT CONTINUES TO BE CALM AND COOPERATIVE WITH CARE. PT NEEDS ASSISTANCE WITH ADL'S. WIFE AND DAUGHTER AT BEDSIDE. AWARE OF PT'S CURRENT STATUS. HCPOA AND LIVING WILL PAPERWORK PRESENT. PT MILDLY CONFUSED HOWEVER PLEASANT.

## 2018-10-27 NOTE — ED Notes (Signed)
PSYCH PROVIDE AT BEDSIDE

## 2018-10-27 NOTE — BH Assessment (Signed)
[facility] Assessment Progress Note  Per Buford Dresser, DO, this pt requires psychiatric hospitalization at this time.  Pt presents under IVC initiated by EDP Daleen Bo, MD.  At 13:47 Caryl Pina calls from [facility] to report that pt has been accepted to their facility by Geanie Kenning, MD.  Waylan Boga, DNP concurs with this decision.  Pt's nurse, Caryl Pina, has been notified, and agrees to call report to [phone].  Pt is to be transported via [facility].  [place] Coordinator [phone]

## 2018-10-27 NOTE — ED Notes (Signed)
DAUGHTER CALLED REQUESTING PAPERWORK BE SENT TO [place] FOR CONSIDERATION FOR BED PLACEMENT.   FAX # [phone] "Yoav Okane " (INTAKE PERSON) [phone]  TOM [place] -NOTIFIED OF FAMILY'S REQUEST. HE STATES WILL SEND PROPER DOCUMENTS TO FACILITATE REQUEST.

## 2018-10-28 DIAGNOSIS — M069 Rheumatoid arthritis, unspecified: Secondary | ICD-10-CM | POA: Insufficient documentation

## 2018-10-28 DIAGNOSIS — N183 Chronic kidney disease, stage 3 unspecified: Secondary | ICD-10-CM | POA: Insufficient documentation

## 2018-10-28 DIAGNOSIS — N179 Acute kidney failure, unspecified: Secondary | ICD-10-CM | POA: Insufficient documentation

## 2018-10-28 DIAGNOSIS — N1832 Chronic kidney disease, stage 3b: Secondary | ICD-10-CM | POA: Insufficient documentation

## 2018-11-11 DIAGNOSIS — R7309 Other abnormal glucose: Secondary | ICD-10-CM | POA: Diagnosis not present

## 2018-11-11 DIAGNOSIS — G309 Alzheimer's disease, unspecified: Secondary | ICD-10-CM | POA: Diagnosis not present

## 2018-11-11 DIAGNOSIS — F39 Unspecified mood [affective] disorder: Secondary | ICD-10-CM | POA: Diagnosis not present

## 2018-11-11 DIAGNOSIS — K5900 Constipation, unspecified: Secondary | ICD-10-CM | POA: Diagnosis not present

## 2018-11-11 DIAGNOSIS — N179 Acute kidney failure, unspecified: Secondary | ICD-10-CM | POA: Diagnosis not present

## 2018-11-11 DIAGNOSIS — R531 Weakness: Secondary | ICD-10-CM | POA: Diagnosis not present

## 2018-11-18 DIAGNOSIS — R488 Other symbolic dysfunctions: Secondary | ICD-10-CM | POA: Diagnosis not present

## 2018-11-18 DIAGNOSIS — G4733 Obstructive sleep apnea (adult) (pediatric): Secondary | ICD-10-CM | POA: Diagnosis not present

## 2018-11-18 DIAGNOSIS — M6281 Muscle weakness (generalized): Secondary | ICD-10-CM | POA: Diagnosis not present

## 2018-11-18 DIAGNOSIS — R262 Difficulty in walking, not elsewhere classified: Secondary | ICD-10-CM | POA: Diagnosis not present

## 2018-11-18 DIAGNOSIS — R2681 Unsteadiness on feet: Secondary | ICD-10-CM | POA: Diagnosis not present

## 2018-11-20 DIAGNOSIS — R488 Other symbolic dysfunctions: Secondary | ICD-10-CM | POA: Diagnosis not present

## 2018-11-20 DIAGNOSIS — M6281 Muscle weakness (generalized): Secondary | ICD-10-CM | POA: Diagnosis not present

## 2018-11-20 DIAGNOSIS — R2681 Unsteadiness on feet: Secondary | ICD-10-CM | POA: Diagnosis not present

## 2018-11-20 DIAGNOSIS — R262 Difficulty in walking, not elsewhere classified: Secondary | ICD-10-CM | POA: Diagnosis not present

## 2018-11-21 DIAGNOSIS — M6281 Muscle weakness (generalized): Secondary | ICD-10-CM | POA: Diagnosis not present

## 2018-11-21 DIAGNOSIS — F0281 Dementia in other diseases classified elsewhere with behavioral disturbance: Secondary | ICD-10-CM | POA: Diagnosis not present

## 2018-11-21 DIAGNOSIS — R2681 Unsteadiness on feet: Secondary | ICD-10-CM | POA: Diagnosis not present

## 2018-11-21 DIAGNOSIS — R488 Other symbolic dysfunctions: Secondary | ICD-10-CM | POA: Diagnosis not present

## 2018-11-21 DIAGNOSIS — R262 Difficulty in walking, not elsewhere classified: Secondary | ICD-10-CM | POA: Diagnosis not present

## 2018-11-25 DIAGNOSIS — M6281 Muscle weakness (generalized): Secondary | ICD-10-CM | POA: Diagnosis not present

## 2018-11-25 DIAGNOSIS — R2681 Unsteadiness on feet: Secondary | ICD-10-CM | POA: Diagnosis not present

## 2018-11-25 DIAGNOSIS — R488 Other symbolic dysfunctions: Secondary | ICD-10-CM | POA: Diagnosis not present

## 2018-11-25 DIAGNOSIS — R262 Difficulty in walking, not elsewhere classified: Secondary | ICD-10-CM | POA: Diagnosis not present

## 2018-11-26 DIAGNOSIS — M6281 Muscle weakness (generalized): Secondary | ICD-10-CM | POA: Diagnosis not present

## 2018-11-26 DIAGNOSIS — R488 Other symbolic dysfunctions: Secondary | ICD-10-CM | POA: Diagnosis not present

## 2018-11-26 DIAGNOSIS — R262 Difficulty in walking, not elsewhere classified: Secondary | ICD-10-CM | POA: Diagnosis not present

## 2018-11-26 DIAGNOSIS — R2681 Unsteadiness on feet: Secondary | ICD-10-CM | POA: Diagnosis not present

## 2018-11-27 DIAGNOSIS — G4733 Obstructive sleep apnea (adult) (pediatric): Secondary | ICD-10-CM | POA: Diagnosis not present

## 2018-11-27 DIAGNOSIS — K219 Gastro-esophageal reflux disease without esophagitis: Secondary | ICD-10-CM | POA: Diagnosis not present

## 2018-11-27 DIAGNOSIS — E7800 Pure hypercholesterolemia, unspecified: Secondary | ICD-10-CM | POA: Diagnosis not present

## 2018-11-27 DIAGNOSIS — R159 Full incontinence of feces: Secondary | ICD-10-CM | POA: Diagnosis not present

## 2018-11-27 DIAGNOSIS — F0281 Dementia in other diseases classified elsewhere with behavioral disturbance: Secondary | ICD-10-CM | POA: Diagnosis not present

## 2018-11-27 DIAGNOSIS — I10 Essential (primary) hypertension: Secondary | ICD-10-CM | POA: Diagnosis not present

## 2018-11-27 DIAGNOSIS — M353 Polymyalgia rheumatica: Secondary | ICD-10-CM | POA: Diagnosis not present

## 2018-11-27 DIAGNOSIS — G309 Alzheimer's disease, unspecified: Secondary | ICD-10-CM | POA: Diagnosis not present

## 2018-11-27 DIAGNOSIS — I25810 Atherosclerosis of coronary artery bypass graft(s) without angina pectoris: Secondary | ICD-10-CM | POA: Diagnosis not present

## 2018-11-27 DIAGNOSIS — I209 Angina pectoris, unspecified: Secondary | ICD-10-CM | POA: Diagnosis not present

## 2018-11-27 DIAGNOSIS — Z79899 Other long term (current) drug therapy: Secondary | ICD-10-CM | POA: Diagnosis not present

## 2018-11-28 DIAGNOSIS — R488 Other symbolic dysfunctions: Secondary | ICD-10-CM | POA: Diagnosis not present

## 2018-11-28 DIAGNOSIS — R2681 Unsteadiness on feet: Secondary | ICD-10-CM | POA: Diagnosis not present

## 2018-11-28 DIAGNOSIS — M6281 Muscle weakness (generalized): Secondary | ICD-10-CM | POA: Diagnosis not present

## 2018-11-28 DIAGNOSIS — R262 Difficulty in walking, not elsewhere classified: Secondary | ICD-10-CM | POA: Diagnosis not present

## 2018-12-02 ENCOUNTER — Encounter: Payer: Self-pay | Admitting: *Deleted

## 2018-12-02 DIAGNOSIS — R2681 Unsteadiness on feet: Secondary | ICD-10-CM | POA: Diagnosis not present

## 2018-12-02 DIAGNOSIS — R262 Difficulty in walking, not elsewhere classified: Secondary | ICD-10-CM | POA: Diagnosis not present

## 2018-12-02 DIAGNOSIS — M6281 Muscle weakness (generalized): Secondary | ICD-10-CM | POA: Diagnosis not present

## 2018-12-02 DIAGNOSIS — R488 Other symbolic dysfunctions: Secondary | ICD-10-CM | POA: Diagnosis not present

## 2018-12-03 DIAGNOSIS — R2681 Unsteadiness on feet: Secondary | ICD-10-CM | POA: Diagnosis not present

## 2018-12-03 DIAGNOSIS — M6281 Muscle weakness (generalized): Secondary | ICD-10-CM | POA: Diagnosis not present

## 2018-12-03 DIAGNOSIS — R488 Other symbolic dysfunctions: Secondary | ICD-10-CM | POA: Diagnosis not present

## 2018-12-03 DIAGNOSIS — R262 Difficulty in walking, not elsewhere classified: Secondary | ICD-10-CM | POA: Diagnosis not present

## 2018-12-04 DIAGNOSIS — M6281 Muscle weakness (generalized): Secondary | ICD-10-CM | POA: Diagnosis not present

## 2018-12-04 DIAGNOSIS — R488 Other symbolic dysfunctions: Secondary | ICD-10-CM | POA: Diagnosis not present

## 2018-12-04 DIAGNOSIS — R262 Difficulty in walking, not elsewhere classified: Secondary | ICD-10-CM | POA: Diagnosis not present

## 2018-12-04 DIAGNOSIS — R2681 Unsteadiness on feet: Secondary | ICD-10-CM | POA: Diagnosis not present

## 2018-12-05 DIAGNOSIS — R262 Difficulty in walking, not elsewhere classified: Secondary | ICD-10-CM | POA: Diagnosis not present

## 2018-12-05 DIAGNOSIS — M6281 Muscle weakness (generalized): Secondary | ICD-10-CM | POA: Diagnosis not present

## 2018-12-05 DIAGNOSIS — R2681 Unsteadiness on feet: Secondary | ICD-10-CM | POA: Diagnosis not present

## 2018-12-05 DIAGNOSIS — R488 Other symbolic dysfunctions: Secondary | ICD-10-CM | POA: Diagnosis not present

## 2018-12-08 DIAGNOSIS — M6281 Muscle weakness (generalized): Secondary | ICD-10-CM | POA: Diagnosis not present

## 2018-12-08 DIAGNOSIS — R262 Difficulty in walking, not elsewhere classified: Secondary | ICD-10-CM | POA: Diagnosis not present

## 2018-12-08 DIAGNOSIS — R488 Other symbolic dysfunctions: Secondary | ICD-10-CM | POA: Diagnosis not present

## 2018-12-08 DIAGNOSIS — R2681 Unsteadiness on feet: Secondary | ICD-10-CM | POA: Diagnosis not present

## 2018-12-10 DIAGNOSIS — R2681 Unsteadiness on feet: Secondary | ICD-10-CM | POA: Diagnosis not present

## 2018-12-10 DIAGNOSIS — M6281 Muscle weakness (generalized): Secondary | ICD-10-CM | POA: Diagnosis not present

## 2018-12-10 DIAGNOSIS — R262 Difficulty in walking, not elsewhere classified: Secondary | ICD-10-CM | POA: Diagnosis not present

## 2018-12-10 DIAGNOSIS — R488 Other symbolic dysfunctions: Secondary | ICD-10-CM | POA: Diagnosis not present

## 2018-12-11 DIAGNOSIS — G309 Alzheimer's disease, unspecified: Secondary | ICD-10-CM | POA: Diagnosis not present

## 2018-12-11 DIAGNOSIS — R269 Unspecified abnormalities of gait and mobility: Secondary | ICD-10-CM | POA: Diagnosis not present

## 2018-12-11 DIAGNOSIS — N183 Chronic kidney disease, stage 3 (moderate): Secondary | ICD-10-CM | POA: Diagnosis not present

## 2018-12-12 DIAGNOSIS — R262 Difficulty in walking, not elsewhere classified: Secondary | ICD-10-CM | POA: Diagnosis not present

## 2018-12-12 DIAGNOSIS — M6281 Muscle weakness (generalized): Secondary | ICD-10-CM | POA: Diagnosis not present

## 2018-12-12 DIAGNOSIS — R488 Other symbolic dysfunctions: Secondary | ICD-10-CM | POA: Diagnosis not present

## 2018-12-12 DIAGNOSIS — R2681 Unsteadiness on feet: Secondary | ICD-10-CM | POA: Diagnosis not present

## 2018-12-16 DIAGNOSIS — R488 Other symbolic dysfunctions: Secondary | ICD-10-CM | POA: Diagnosis not present

## 2018-12-16 DIAGNOSIS — R262 Difficulty in walking, not elsewhere classified: Secondary | ICD-10-CM | POA: Diagnosis not present

## 2018-12-16 DIAGNOSIS — R2681 Unsteadiness on feet: Secondary | ICD-10-CM | POA: Diagnosis not present

## 2018-12-16 DIAGNOSIS — F0281 Dementia in other diseases classified elsewhere with behavioral disturbance: Secondary | ICD-10-CM | POA: Diagnosis not present

## 2018-12-16 DIAGNOSIS — M6281 Muscle weakness (generalized): Secondary | ICD-10-CM | POA: Diagnosis not present

## 2018-12-17 DIAGNOSIS — R2681 Unsteadiness on feet: Secondary | ICD-10-CM | POA: Diagnosis not present

## 2018-12-17 DIAGNOSIS — R488 Other symbolic dysfunctions: Secondary | ICD-10-CM | POA: Diagnosis not present

## 2018-12-17 DIAGNOSIS — M6281 Muscle weakness (generalized): Secondary | ICD-10-CM | POA: Diagnosis not present

## 2018-12-17 DIAGNOSIS — R262 Difficulty in walking, not elsewhere classified: Secondary | ICD-10-CM | POA: Diagnosis not present

## 2018-12-18 DIAGNOSIS — R2681 Unsteadiness on feet: Secondary | ICD-10-CM | POA: Diagnosis not present

## 2018-12-18 DIAGNOSIS — M6281 Muscle weakness (generalized): Secondary | ICD-10-CM | POA: Diagnosis not present

## 2018-12-18 DIAGNOSIS — R488 Other symbolic dysfunctions: Secondary | ICD-10-CM | POA: Diagnosis not present

## 2018-12-18 DIAGNOSIS — R262 Difficulty in walking, not elsewhere classified: Secondary | ICD-10-CM | POA: Diagnosis not present

## 2018-12-19 DIAGNOSIS — R2681 Unsteadiness on feet: Secondary | ICD-10-CM | POA: Diagnosis not present

## 2018-12-19 DIAGNOSIS — R488 Other symbolic dysfunctions: Secondary | ICD-10-CM | POA: Diagnosis not present

## 2018-12-19 DIAGNOSIS — R262 Difficulty in walking, not elsewhere classified: Secondary | ICD-10-CM | POA: Diagnosis not present

## 2018-12-19 DIAGNOSIS — M6281 Muscle weakness (generalized): Secondary | ICD-10-CM | POA: Diagnosis not present

## 2018-12-22 DIAGNOSIS — R262 Difficulty in walking, not elsewhere classified: Secondary | ICD-10-CM | POA: Diagnosis not present

## 2018-12-22 DIAGNOSIS — R2681 Unsteadiness on feet: Secondary | ICD-10-CM | POA: Diagnosis not present

## 2018-12-22 DIAGNOSIS — M6281 Muscle weakness (generalized): Secondary | ICD-10-CM | POA: Diagnosis not present

## 2018-12-22 DIAGNOSIS — R488 Other symbolic dysfunctions: Secondary | ICD-10-CM | POA: Diagnosis not present

## 2018-12-25 DIAGNOSIS — R488 Other symbolic dysfunctions: Secondary | ICD-10-CM | POA: Diagnosis not present

## 2018-12-25 DIAGNOSIS — M6281 Muscle weakness (generalized): Secondary | ICD-10-CM | POA: Diagnosis not present

## 2018-12-25 DIAGNOSIS — R262 Difficulty in walking, not elsewhere classified: Secondary | ICD-10-CM | POA: Diagnosis not present

## 2018-12-25 DIAGNOSIS — R2681 Unsteadiness on feet: Secondary | ICD-10-CM | POA: Diagnosis not present

## 2018-12-30 DIAGNOSIS — R2681 Unsteadiness on feet: Secondary | ICD-10-CM | POA: Diagnosis not present

## 2018-12-30 DIAGNOSIS — M6281 Muscle weakness (generalized): Secondary | ICD-10-CM | POA: Diagnosis not present

## 2018-12-30 DIAGNOSIS — R262 Difficulty in walking, not elsewhere classified: Secondary | ICD-10-CM | POA: Diagnosis not present

## 2018-12-30 DIAGNOSIS — R488 Other symbolic dysfunctions: Secondary | ICD-10-CM | POA: Diagnosis not present

## 2019-01-01 DIAGNOSIS — R488 Other symbolic dysfunctions: Secondary | ICD-10-CM | POA: Diagnosis not present

## 2019-01-01 DIAGNOSIS — M6281 Muscle weakness (generalized): Secondary | ICD-10-CM | POA: Diagnosis not present

## 2019-01-01 DIAGNOSIS — R262 Difficulty in walking, not elsewhere classified: Secondary | ICD-10-CM | POA: Diagnosis not present

## 2019-01-01 DIAGNOSIS — R2681 Unsteadiness on feet: Secondary | ICD-10-CM | POA: Diagnosis not present

## 2019-01-06 ENCOUNTER — Ambulatory Visit: Payer: Self-pay | Admitting: Gastroenterology

## 2019-01-06 DIAGNOSIS — M6281 Muscle weakness (generalized): Secondary | ICD-10-CM | POA: Diagnosis not present

## 2019-01-06 DIAGNOSIS — R2681 Unsteadiness on feet: Secondary | ICD-10-CM | POA: Diagnosis not present

## 2019-01-06 DIAGNOSIS — R488 Other symbolic dysfunctions: Secondary | ICD-10-CM | POA: Diagnosis not present

## 2019-01-06 DIAGNOSIS — R262 Difficulty in walking, not elsewhere classified: Secondary | ICD-10-CM | POA: Diagnosis not present

## 2019-01-07 ENCOUNTER — Telehealth: Payer: Self-pay | Admitting: Neurology

## 2019-01-07 NOTE — Telephone Encounter (Signed)
Pts wife called and is insisting that her husband and her are seen together. She is ok consenting to a Virtual visit but would like to be treated at the same appt. Please advise.

## 2019-01-08 ENCOUNTER — Telehealth: Payer: Self-pay

## 2019-01-08 NOTE — Telephone Encounter (Signed)
Patient's wife has given a verbal consent to doing a telephone visit with Butler Denmark, NP.   She stated that her husband's memory has declined since his last office. He was recently in the hospital Jan of 2020.

## 2019-01-08 NOTE — Telephone Encounter (Signed)
Attempted to reach the pt and the pt's wife, no answer. Will try again at a later time.

## 2019-01-09 NOTE — Telephone Encounter (Signed)
I contacted the pt's wife and pt has been scheduled for 01/12/19 at 3 :28 with SS, NP for a telephone visit.

## 2019-01-12 ENCOUNTER — Ambulatory Visit (INDEPENDENT_AMBULATORY_CARE_PROVIDER_SITE_OTHER): Payer: Medicare Other | Admitting: Neurology

## 2019-01-12 ENCOUNTER — Other Ambulatory Visit: Payer: Self-pay

## 2019-01-12 ENCOUNTER — Encounter: Payer: Self-pay | Admitting: Neurology

## 2019-01-12 ENCOUNTER — Ambulatory Visit: Payer: Medicare Other | Admitting: Nurse Practitioner

## 2019-01-12 DIAGNOSIS — G300 Alzheimer's disease with early onset: Secondary | ICD-10-CM

## 2019-01-12 DIAGNOSIS — F0281 Dementia in other diseases classified elsewhere with behavioral disturbance: Secondary | ICD-10-CM

## 2019-01-12 DIAGNOSIS — F02818 Dementia in other diseases classified elsewhere, unspecified severity, with other behavioral disturbance: Secondary | ICD-10-CM

## 2019-01-12 DIAGNOSIS — M6281 Muscle weakness (generalized): Secondary | ICD-10-CM | POA: Diagnosis not present

## 2019-01-12 DIAGNOSIS — R262 Difficulty in walking, not elsewhere classified: Secondary | ICD-10-CM | POA: Diagnosis not present

## 2019-01-12 DIAGNOSIS — R2681 Unsteadiness on feet: Secondary | ICD-10-CM | POA: Diagnosis not present

## 2019-01-12 DIAGNOSIS — R488 Other symbolic dysfunctions: Secondary | ICD-10-CM | POA: Diagnosis not present

## 2019-01-12 MED ORDER — MEMANTINE HCL 10 MG PO TABS: 10.0000 mg | ORAL_TABLET | Freq: Two times a day (BID) | ORAL | 3 refills | Status: DC

## 2019-01-12 MED ORDER — DONEPEZIL HCL 10 MG PO TABS: 10.0000 mg | ORAL_TABLET | Freq: Every day | ORAL | 5 refills | Status: DC

## 2019-01-12 NOTE — Progress Notes (Signed)
Virtual Visit via Telephone Note  I connected with Devin Vance on 01/12/19 at  3:15 PM EDT by telephone and verified that I am speaking with the correct person using two identifiers.   I discussed the limitations, risks, security and privacy concerns of performing an evaluation and management service by telephone and the availability of in person appointments. I also discussed with the patient that there may be a patient responsible charge related to this service. The patient expressed understanding and agreed to proceed.   History of Present Illness: January 12, 2019 SS: Devin Vance is an 85 year old male with history of memory disturbance.  He is currently taking Namenda 10 mg twice daily, Aricept 10 mg at night.  At his last visit in October 2019 he was started on Lexapro 5 mg daily for agitation.  Lexapro was increased to 10 mg daily after worsening aggression, agitation.  Lexapro was stopped and he was switched to Wellbutrin 150 mg extended release tablet once daily.  Apparently he was admitted to [facility] in January 2020 Under IVC for agitation with suicidal threats.  At that time his medication was adjusted.  The Wellbutrin was discontinued.  He was started on Risperdal and Depakote.  He is currently taking Risperdal 0.5 mg at bedtime, Depakote 500 mg at bedtime.  His wife reports that his agitation and behavior is much better.  He currently resides at [facility] community with his wife.  He reports that he is doing well.  He reports continued trouble with memory.  He has trouble with his short-term memory, remembering names, appointments. He is able to complete his own ADLs.  He has been working with physical therapy/Occupational Therapy with sequencing to help him remember things.  This is helped him be able to remember to brush his dentures and do oral care.  He reports he is able to drive, however he has not been driving.  He reports he has a good appetite.  During  the day he likes to watch TV, talk with friends, read, play games.  Apparently he recently taught himself how to play rummy.  His wife manages his medications, their finances.  He walks well, has good balance, has not had any falls.  He does not require any assistive devices.  He used to be a Technical brewer, has a Tax adviser in Therapist, sports.  He is currently seeing a psychiatrist.  He denies any new problems or concerns.  07/31/2018 Dr. Jannifer Vance Devin Vance is an 85 year old right-handed white male with a history of a progressive memory disturbance.  The patient has had memory problems for at least a year, possibly up to 2 years.  The patient has had some short-term memory issues, he has no significant issues with word finding or remembering names for people.  He relies on his wife to help him with keeping up with medications and appointments, but she has been doing this for quite a number of years.  Within the last year, the patient had to give up doing the finances, his wife currently does this.  The patient has a lot of difficulty remembering how to play certain board games that he used to know how to do quite easily.  The patient has sleep apnea, but he does not use CPAP regularly.  He does not have significant fatigue issues during the day.  He is still operating a motor vehicle, he does have some problems with directions.  The patient denies any balance issues or difficulty with  falls, he denies any numbness or weakness of the extremities, he does not have a lot of problems controlling the bowels or the bladder but he does have some chronic constipation issues.  The patient has been followed by Dr. Manuella Ghazi from neurology, he is on Aricept 10 mg at night and on Namenda 5 mg twice daily.  The patient has some delusional thinking, he indicates that his wife is yelling at him all the time.  The patient does have some agitation with this.  He comes to this office for an evaluation.   Observations/Objective: Reviewed  medical history, medications  Alert, most of history obtained from wife, answers questions appropriately when asked  Assessment and Plan: 1.  Progressive memory disturbance  Overall he appears to be doing well.  He will continue taking Namenda and Aricept daily.  A refill was sent to the pharmacy.  Since his last visit he was admitted to [facility] psychiatric unit for agitation, his medications were adjusted.  He is now taking Risperdal 0.5 mg at bedtime and Depakote 500 mg at bedtime.  His wife reports he is tolerating the medications well, his agitation, behavioral disturbances are much better.  We were unable to check a memory test on this telephone call, his MMSE was 23/30 in October 2019.  Follow Up Instructions: 6 months for revisit   I discussed the assessment and treatment plan with the patient. The patient was provided an opportunity to ask questions and all were answered. The patient agreed with the plan and demonstrated an understanding of the instructions.   The patient was advised to call back or seek an in-person evaluation if the symptoms worsen or if the condition fails to improve as anticipated.  I provided 35 minutes of non-face-to-face time during this encounter.  Devin Dakin, DNP  [facility] [address], [place], [place] [address] [phone]

## 2019-01-12 NOTE — Progress Notes (Signed)
I have read the note, and I agree with the clinical assessment and plan.  Taiwo Fish K Dion Sibal   

## 2019-01-27 ENCOUNTER — Ambulatory Visit: Payer: Self-pay | Admitting: Gastroenterology

## 2019-02-09 DIAGNOSIS — I2510 Atherosclerotic heart disease of native coronary artery without angina pectoris: Secondary | ICD-10-CM | POA: Diagnosis not present

## 2019-02-09 DIAGNOSIS — N183 Chronic kidney disease, stage 3 (moderate): Secondary | ICD-10-CM | POA: Diagnosis not present

## 2019-02-09 DIAGNOSIS — G629 Polyneuropathy, unspecified: Secondary | ICD-10-CM | POA: Diagnosis not present

## 2019-02-09 DIAGNOSIS — I10 Essential (primary) hypertension: Secondary | ICD-10-CM | POA: Diagnosis not present

## 2019-02-09 DIAGNOSIS — R269 Unspecified abnormalities of gait and mobility: Secondary | ICD-10-CM | POA: Diagnosis not present

## 2019-02-09 DIAGNOSIS — D649 Anemia, unspecified: Secondary | ICD-10-CM | POA: Diagnosis not present

## 2019-02-09 DIAGNOSIS — G309 Alzheimer's disease, unspecified: Secondary | ICD-10-CM | POA: Diagnosis not present

## 2019-02-09 DIAGNOSIS — E785 Hyperlipidemia, unspecified: Secondary | ICD-10-CM | POA: Diagnosis not present

## 2019-02-09 DIAGNOSIS — M353 Polymyalgia rheumatica: Secondary | ICD-10-CM | POA: Diagnosis not present

## 2019-02-09 DIAGNOSIS — Z0000 Encounter for general adult medical examination without abnormal findings: Secondary | ICD-10-CM | POA: Diagnosis not present

## 2019-02-09 DIAGNOSIS — J449 Chronic obstructive pulmonary disease, unspecified: Secondary | ICD-10-CM | POA: Diagnosis not present

## 2019-02-09 DIAGNOSIS — Z1389 Encounter for screening for other disorder: Secondary | ICD-10-CM | POA: Diagnosis not present

## 2019-02-13 DIAGNOSIS — F0281 Dementia in other diseases classified elsewhere with behavioral disturbance: Secondary | ICD-10-CM | POA: Diagnosis not present

## 2019-03-11 DIAGNOSIS — I2510 Atherosclerotic heart disease of native coronary artery without angina pectoris: Secondary | ICD-10-CM | POA: Diagnosis not present

## 2019-03-11 DIAGNOSIS — Z9079 Acquired absence of other genital organ(s): Secondary | ICD-10-CM | POA: Diagnosis not present

## 2019-03-11 DIAGNOSIS — M353 Polymyalgia rheumatica: Secondary | ICD-10-CM | POA: Diagnosis not present

## 2019-03-11 DIAGNOSIS — I10 Essential (primary) hypertension: Secondary | ICD-10-CM | POA: Diagnosis not present

## 2019-03-11 DIAGNOSIS — R7303 Prediabetes: Secondary | ICD-10-CM | POA: Diagnosis not present

## 2019-03-11 DIAGNOSIS — Z8546 Personal history of malignant neoplasm of prostate: Secondary | ICD-10-CM | POA: Diagnosis not present

## 2019-03-17 ENCOUNTER — Ambulatory Visit: Payer: Self-pay | Admitting: Gastroenterology

## 2019-03-24 ENCOUNTER — Encounter: Payer: Self-pay | Admitting: Gastroenterology

## 2019-03-24 ENCOUNTER — Other Ambulatory Visit: Payer: Self-pay

## 2019-03-24 ENCOUNTER — Ambulatory Visit (INDEPENDENT_AMBULATORY_CARE_PROVIDER_SITE_OTHER): Payer: Medicare Other | Admitting: Gastroenterology

## 2019-03-24 VITALS — BP 117/76 | HR 70 | Temp 98.4°F | Ht 70.0 in | Wt 195.6 lb

## 2019-03-24 DIAGNOSIS — J449 Chronic obstructive pulmonary disease, unspecified: Secondary | ICD-10-CM | POA: Insufficient documentation

## 2019-03-24 DIAGNOSIS — I429 Cardiomyopathy, unspecified: Secondary | ICD-10-CM | POA: Insufficient documentation

## 2019-03-24 DIAGNOSIS — K5900 Constipation, unspecified: Secondary | ICD-10-CM

## 2019-03-24 DIAGNOSIS — E785 Hyperlipidemia, unspecified: Secondary | ICD-10-CM | POA: Insufficient documentation

## 2019-03-24 NOTE — Progress Notes (Signed)
Gastroenterology Consultation  Referring Provider:     Yolonda Kida, MD Primary Care Physician:  Wenda Low, MD Primary Gastroenterologist:  Dr. Allen Norris     Reason for Consultation:     GERD and stool incontinence        HPI:   Devin Vance is a 85 y.o. y/o male referred for consultation & management of GERD and stool incontinence by Dr. Lysle Rubens.  This patient has a history of stool incontinence that have inproved.  The patient also has a history of heartburn.  He was recently in the psychiatric ward for suicidal ideations.  The last time it appears he saw gastroenterology was back in 2008 and it was with Dr. Vira Agar.  The patient was recently seen by his cardiologist Dr. Clayborn Bigness. The patient has had urinary and stool incontinence but the stool has become less often. He wears depends. The patient has had constipation more recently. It can go from hard to soft. He can go 4-5 days without a bowel movement. The patient's wife is giving most of the history since the patient only converses with a few words.  He does deny any abdominal pain nausea vomiting fevers or chills.  He also denies her report of him being constipated and states that his bowel movements are normal. The patient's wife also reports that the patient had several teeth removed this week and he has not been eating very well.  She also states that he is very resistant to drinking any fluids and only consumes about 3 cups of fluid a day of varying beverages.  The patient has also not avoided milk products when he had the diarrhea and the wife states he eats ice cream has milk with his cereal and has a variety of other dairy products throughout the day.  Past Medical History:  Diagnosis Date  . Alzheimer disease ([place]) 07/31/2018  . Angina pectoris ([place])   . Arthritis   . Cardiomyopathy ([place])   . COPD (chronic obstructive pulmonary disease) ([place])   . Gastroesophageal reflux disease   . History of colon polyps   .  Hyperlipidemia   . Hypertension   . Obesity   . Osteoporosis   . Polymyalgia rheumatica ([place])   . Prostate cancer ([place])   . Rheumatoid arthritis ([place])   . Sleep apnea     Past Surgical History:  Procedure Laterality Date  . CATARACT EXTRACTION    . CORONARY ANGIOPLASTY    . HEMORRHOIDECTOMY WITH HEMORRHOID BANDING    . PROSTATECTOMY    . STRABISMUS SURGERY    . TONSILLECTOMY      Prior to Admission medications   Medication Sig Start Date End Date Taking? Authorizing Provider  aspirin EC 81 MG tablet Take by mouth.    [provider]  Calcium Carbonate-Vitamin D 600-400 MG-UNIT tablet Take by mouth.    [provider]  Cholecalciferol (VITAMIN D-3) 125 MCG (5000 UT) TABS Take 1 tablet by mouth every morning.    [provider]  divalproex (DEPAKOTE) 500 MG DR tablet Take 500 mg by mouth at bedtime.    [provider]  donepezil (ARICEPT) 10 MG tablet Take 1 tablet (10 mg total) by mouth at bedtime. 01/12/19   Suzzanne Cloud, NP  hydroxychloroquine (PLAQUENIL) 200 MG tablet Take 400 mg by mouth every morning.    [provider]  Lactobacillus (PROBIOTIC ACIDOPHILUS PO) Take 1 tablet by mouth daily.    [provider]  Lutein 20  MG CAPS Take by mouth.    [provider]  memantine (NAMENDA) 10 MG tablet Take 1 tablet (10 mg total) by mouth 2 (two) times daily. 01/12/19   Suzzanne Cloud, NP  metoprolol succinate (TOPROL-XL) 25 MG 24 hr tablet Take by mouth. 08/20/16 10/24/18  [provider]  Multiple Vitamin (MULTIVITAMIN) capsule Take by mouth.    [provider]  predniSONE (DELTASONE) 2.5 MG tablet Take 2.5 mg by mouth every morning. 10/20/18   [provider]  ramipril (ALTACE) 5 MG capsule Take 5 mg by mouth every morning.  08/20/16 10/24/18  [provider]  risperiDONE (RISPERDAL) 0.5 MG tablet Take 0.5 mg by mouth at bedtime.    [provider]  tamsulosin (FLOMAX) 0.4 MG CAPS  capsule Take 0.8 mg by mouth every evening. 09/24/18   [provider]    Family History  Problem Relation Age of Onset  . Stroke Mother   . Dementia Mother   . Cancer Father   . Dementia Sister   . Dementia Maternal Aunt      Social History   Tobacco Use  . Smoking status: Never Smoker  . Smokeless tobacco: Never Used  Substance Use Topics  . Alcohol use: Not Currently  . Drug use: Not Currently    Allergies as of 03/24/2019 - Review Complete 01/12/2019  Allergen Reaction Noted  . Lorazepam Other (See Comments) 09/09/2017    Review of Systems:    All systems reviewed and negative except where noted in HPI.   Physical Exam:  There were no vitals taken for this visit. No LMP for male patient. General:   Alert,  Well-developed, well-nourished, pleasant and cooperative in NAD Head:  Normocephalic and atraumatic. Eyes:  Sclera clear, no icterus.   Conjunctiva pink. Ears:  Normal auditory acuity. Nose:  No deformity, discharge, or lesions. Mouth:  No deformity or lesions,oropharynx pink & moist. Neck:  Supple; no masses or thyromegaly. Lungs:  Respirations even and unlabored.  Clear throughout to auscultation.   No wheezes, crackles, or rhonchi. No acute distress. Heart:  Regular rate and rhythm; no murmurs, clicks, rubs, or gallops. Abdomen:  Normal bowel sounds.  No bruits.  Soft, non-tender and non-distended without masses, hepatosplenomegaly or hernias noted.  No guarding or rebound tenderness.  Negative Carnett sign.   Rectal:  Deferred.  Msk:  Symmetrical without gross deformities.  Good, equal movement & strength bilaterally. Pulses:  Normal pulses noted. Extremities:  No clubbing or edema.  No cyanosis. Neurologic:  Alert;  grossly normal neurologically. Skin:  Intact without significant lesions or rashes.  No jaundice. Lymph Nodes:  No significant cervical adenopathy. Psych:  Alert and cooperative. Normal mood and affect.  Imaging Studies: No results  found.  Assessment and Plan:   Devin Vance is a 85 y.o. y/o male who has a history of diarrhea with incontinence and constipation.  The patient also has a history of heartburn.  Since his acute admission to the hospital for psychiatric issues the wife states that his diarrhea has resolved and so has his heartburn but her main concern is his constipation.  She is also concerned that he may not be taking in enough fluids.  The patient and his wife have been told to increase fiber in his diet with supplementing his diet with Citrucel once or twice a day.  She has also been instructed to observe the color of his urine to see if he is well-hydrated and she has been  told she can also look at his tongue to see if it is moist to make sure he is getting enough fluid.  She has been told to avoid MiraLAX because the patient has episodes of loose bowel movements and diarrhea and this may exacerbate that.  The patient and his wife have been explained the plan and agree with it.  Lucilla Lame, MD. Marval Regal    Note: This dictation was prepared with Dragon dictation along with smaller phrase technology. Any transcriptional errors that result from this process are unintentional.

## 2019-04-20 DIAGNOSIS — M8949 Other hypertrophic osteoarthropathy, multiple sites: Secondary | ICD-10-CM | POA: Diagnosis not present

## 2019-04-20 DIAGNOSIS — M353 Polymyalgia rheumatica: Secondary | ICD-10-CM | POA: Diagnosis not present

## 2019-04-20 DIAGNOSIS — M1990 Unspecified osteoarthritis, unspecified site: Secondary | ICD-10-CM | POA: Diagnosis not present

## 2019-05-12 DIAGNOSIS — F0281 Dementia in other diseases classified elsewhere with behavioral disturbance: Secondary | ICD-10-CM | POA: Diagnosis not present

## 2019-06-04 DIAGNOSIS — I209 Angina pectoris, unspecified: Secondary | ICD-10-CM | POA: Diagnosis not present

## 2019-06-04 DIAGNOSIS — I10 Essential (primary) hypertension: Secondary | ICD-10-CM | POA: Diagnosis not present

## 2019-06-04 DIAGNOSIS — E7800 Pure hypercholesterolemia, unspecified: Secondary | ICD-10-CM | POA: Diagnosis not present

## 2019-07-01 DIAGNOSIS — H43813 Vitreous degeneration, bilateral: Secondary | ICD-10-CM | POA: Diagnosis not present

## 2019-07-01 DIAGNOSIS — Z79899 Other long term (current) drug therapy: Secondary | ICD-10-CM | POA: Diagnosis not present

## 2019-07-01 DIAGNOSIS — Z961 Presence of intraocular lens: Secondary | ICD-10-CM | POA: Diagnosis not present

## 2019-07-13 DIAGNOSIS — R269 Unspecified abnormalities of gait and mobility: Secondary | ICD-10-CM | POA: Diagnosis not present

## 2019-07-13 DIAGNOSIS — R531 Weakness: Secondary | ICD-10-CM | POA: Diagnosis not present

## 2019-07-13 DIAGNOSIS — R197 Diarrhea, unspecified: Secondary | ICD-10-CM | POA: Diagnosis not present

## 2019-07-13 DIAGNOSIS — G309 Alzheimer's disease, unspecified: Secondary | ICD-10-CM | POA: Diagnosis not present

## 2019-07-13 DIAGNOSIS — K5900 Constipation, unspecified: Secondary | ICD-10-CM | POA: Diagnosis not present

## 2019-07-13 DIAGNOSIS — F39 Unspecified mood [affective] disorder: Secondary | ICD-10-CM | POA: Diagnosis not present

## 2019-07-14 NOTE — Progress Notes (Signed)
PATIENT: Devin Vance DOB: [birth year]  REASON FOR VISIT: follow up HISTORY FROM: patient  HISTORY OF PRESENT ILLNESS: Today 07/15/19  [name] is an 85 year old male with history of memory disturbance.  He is currently taking Aricept and Namenda.  He is here today with his wife.  She reports over the last few days she has noticed he has been weaker than normal.  The weakness is generalized.  She has noticed that it is been harder for him to get out of bed.  He has not had any falls.  Over the last several months, she has noticed that his gait has changed, he now has a shorter stride, shuffling gait.  He has urinary and bowel incontinence.  He has episodes of diarrhea, and constipation.  They recently had a telephone visit with her primary care doctor to discuss the bowel issues.  He is not having any problems with agitation, and he remains on Depakote and Risperdal.  She denies any underlying illness or signs of infection.  He has not been running a fever.  He has had good appetite as of lately.  He and his wife live in a independent living apartment at [facility].  There is no specific weakness to the right or left side, or slurred speech.  His wife manages his medications.  His wife wonders if the Aricept could be causing him to have a runny nose.  He presents today for follow-up accompanied by his wife, [name].  HISTORY  January 12, 2019 SS: [name] is an 85 year old male with history of memory disturbance.  He is currently taking Namenda 10 mg twice daily, Aricept 10 mg at night.  At his last visit in October 2019 he was started on Lexapro 5 mg daily for agitation.  Lexapro was increased to 10 mg daily after worsening aggression, agitation.  Lexapro was stopped and he was switched to Wellbutrin 150 mg extended release tablet once daily.  Apparently he was admitted to [facility] in January 2020 Under IVC for agitation with suicidal threats.  At that time his medication was  adjusted.  The Wellbutrin was discontinued.  He was started on Risperdal and Depakote.  He is currently taking Risperdal 0.5 mg at bedtime, Depakote 500 mg at bedtime.  His wife reports that his agitation and behavior is much better.  He currently resides at [facility] retirement community with his wife.  He reports that he is doing well.  He reports continued trouble with memory.  He has trouble with his short-term memory, remembering names, appointments. He is able to complete his own ADLs.  He has been working with physical therapy/Occupational Therapy with sequencing to help him remember things.  This is helped him be able to remember to brush his dentures and do oral care.  He reports he is able to drive, however he has not been driving.  He reports he has a good appetite.  During the day he likes to watch TV, talk with friends, read, play games.  Apparently he recently taught himself how to play rummy.  His wife manages his medications, their finances.  He walks well, has good balance, has not had any falls.  He does not require any assistive devices.  He used to be a Technical brewer, has a Tax adviser in Therapist, sports.  He is currently seeing a psychiatrist.  He denies any new problems or concerns.  07/31/2018 [name] [name] an 85 year old right-handed white male with a history of  a progressive memory disturbance. The patient has had memory problems for at least a year, possibly up to 2 years. The patient has had some short-term memory issues, he has no significant issues with word finding or remembering names for people. He relies on his wife to help him with keeping up with medications and appointments, but she has been doing this for quite a number of years. Within the last year, the patient had to give up doing the finances, his wife currently does this. The patient has a lot of difficulty remembering how to play certain board games that he used to know how to do quite easily. The patient  has sleep apnea, but he does not use CPAP regularly. He does not have significant fatigue issues during the day. He is still operating a motor vehicle, he does have some problems with directions. The patient denies any balance issues or difficulty with falls, he denies any numbness or weakness of the extremities, he does not have a lot of problems controlling the bowels or the bladder but he does have some chronic constipation issues. The patient has been followed by [name] neurology, he is on Aricept 10 mg at night and on Namenda 5 mg twice daily. The patient has some delusional thinking, he indicates that his wife is yelling at him all the time. The patient does have some agitation with this. He comes to this office for an evaluation.  REVIEW OF SYSTEMS: Out of a complete 14 system review of symptoms, the patient complains only of the following symptoms, and all other reviewed systems are negative.  Weakness, memory loss  ALLERGIES: Allergies  Allergen Reactions  . Lorazepam Other (See Comments)    Generalized weakness    HOME MEDICATIONS: Outpatient Medications Prior to Visit  Medication Sig Dispense Refill  . aspirin EC 81 MG tablet Take by mouth.    . Calcium Carbonate-Vitamin D 600-400 MG-UNIT tablet Take by mouth.    . chlorhexidine (PERIDEX) 0.12 % solution     . Cholecalciferol (VITAMIN D-3) 125 MCG (5000 UT) TABS Take 1 tablet by mouth every morning.    . divalproex (DEPAKOTE) 500 MG DR tablet Take 500 mg by mouth at bedtime.    . donepezil (ARICEPT) 10 MG tablet Take 1 tablet (10 mg total) by mouth at bedtime. 30 tablet 5  . Ferrous Sulfate (IRON PO) Take 65 mg by mouth.    . hydroxychloroquine (PLAQUENIL) 200 MG tablet Take 200 mg by mouth daily.    Marland Kitchen ketoconazole (NIZORAL) 2 % shampoo     . Lactobacillus (PROBIOTIC ACIDOPHILUS PO) Take 1 tablet by mouth daily.    . Lutein 20 MG CAPS Take 40 mg by mouth daily.     . memantine (NAMENDA) 10 MG tablet Take 1 tablet  (10 mg total) by mouth 2 (two) times daily. 180 tablet 3  . Multiple Vitamin (MULTIVITAMIN) capsule Take by mouth.    . Omega-3 Fatty Acids (FISH OIL) 1200 MG CAPS Take by mouth.    . ramipril (ALTACE) 5 MG capsule Take 5 mg by mouth daily.    . risperiDONE (RISPERDAL) 0.5 MG tablet Take 0.5 mg by mouth at bedtime.    . rosuvastatin (CRESTOR) 40 MG tablet     . metoprolol succinate (TOPROL-XL) 25 MG 24 hr tablet Take by mouth.    . hydroxychloroquine (PLAQUENIL) 200 MG tablet Take 400 mg by mouth every morning.    . predniSONE (DELTASONE) 2.5 MG tablet Take 2.5 mg by  mouth every morning.    . ramipril (ALTACE) 5 MG capsule Take 5 mg by mouth every morning.     . tamsulosin (FLOMAX) 0.4 MG CAPS capsule Take 0.8 mg by mouth every evening.    . traZODone (DESYREL) 50 MG tablet Take 50 mg by mouth at bedtime.     No facility-administered medications prior to visit.     PAST MEDICAL HISTORY: Past Medical History:  Diagnosis Date  . Alzheimer disease ([place]) 07/31/2018  . Angina pectoris ([place])   . Arthritis   . Cardiomyopathy ([place])   . COPD (chronic obstructive pulmonary disease) ([place])   . Gastroesophageal reflux disease   . History of colon polyps   . Hyperlipidemia   . Hypertension   . Obesity   . Osteoporosis   . Polymyalgia rheumatica ([place])   . Prostate cancer ([place])   . Rheumatoid arthritis ([place])   . Sleep apnea     PAST SURGICAL HISTORY: Past Surgical History:  Procedure Laterality Date  . CATARACT EXTRACTION    . CORONARY ANGIOPLASTY    . HEMORRHOIDECTOMY WITH HEMORRHOID BANDING    . PROSTATECTOMY    . STRABISMUS SURGERY    . TONSILLECTOMY      FAMILY HISTORY: Family History  Problem Relation Age of Onset  . Stroke Mother   . Dementia Mother   . Cancer Father   . Dementia Sister   . Dementia Maternal Aunt     SOCIAL HISTORY: Social History   Socioeconomic History  . Marital status: Married    Spouse name: Not on file  . Number of children: Not on file  .  Years of education: Not on file  . Highest education level: Not on file  Occupational History  . Not on file  Social Needs  . Financial resource strain: Not on file  . Food insecurity    Worry: Not on file    Inability: Not on file  . Transportation needs    Medical: Not on file    Non-medical: Not on file  Tobacco Use  . Smoking status: Never Smoker  . Smokeless tobacco: Never Used  Substance and Sexual Activity  . Alcohol use: Not Currently  . Drug use: Not Currently  . Sexual activity: Not on file  Lifestyle  . Physical activity    Days per week: Not on file    Minutes per session: Not on file  . Stress: Not on file  Relationships  . Social Herbalist on phone: Not on file    Gets together: Not on file    Attends religious service: Not on file    Active member of club or organization: Not on file    Attends meetings of clubs or organizations: Not on file    Relationship status: Not on file  . Intimate partner violence    Fear of current or ex partner: Not on file    Emotionally abused: Not on file    Physically abused: Not on file    Forced sexual activity: Not on file  Other Topics Concern  . Not on file  Social History Narrative  . Not on file    PHYSICAL EXAM  Vitals:   07/15/19 1238  Temp: 97.8 F (36.6 C)  TempSrc: Oral  Weight: 193 lb 12.8 oz (87.9 kg)  Height: 5\' 10"  (1.778 m)   Body mass index is 27.81 kg/m.  Generalized: Well developed, in no acute distress  MMSE - Mini Mental State Exam  07/15/2019 07/31/2018  Not completed: (No Data) -  Orientation to time 3 4  Orientation to Place 0 3  Registration 3 3  Attention/ Calculation 0 5  Recall 0 0  Language- name 2 objects 2 2  Language- repeat 0 1  Language- follow 3 step command 2 2  Language- follow 3 step command-comments he held the paper in the air after he folded it -  Language- read & follow direction 1 1  Write a sentence 1 1  Copy design 1 1  Total score 13 23     Neurological examination  Mentation: Alert oriented to time, place, history is provided by his wife.  Follows all commands speech and language fluent Cranial nerve II-XII: Pupils were equal round reactive to light. Extraocular movements were full, visual field were full on confrontational test. Facial sensation and strength were normal.  Head turning and shoulder shrug  were normal and symmetric. Motor: The motor testing reveals 4 over 5 strength of all 4 extremities. Good symmetric motor tone is noted throughout. No tremor is noted. Sensory: Sensory testing is intact to soft touch on all 4 extremities. No evidence of extinction is noted.  Coordination: Cerebellar testing reveals good finger-nose-finger bilaterally Gait and station: Has to push off from seated position to rise, gait has a short stride, shuffling aspect, forward leaning Reflexes: Deep tendon reflexes are symmetric and normal bilaterally.   DIAGNOSTIC DATA (LABS, IMAGING, TESTING) - I reviewed patient records, labs, notes, testing and imaging myself where available.  Lab Results  Component Value Date   WBC 9.5 10/24/2018   HGB 11.4 (L) 10/24/2018   HCT 36.4 (L) 10/24/2018   MCV 96.8 10/24/2018   PLT 205 10/24/2018      Component Value Date/Time   NA 138 10/24/2018 2018   NA 145 04/09/2014 1759   K 3.9 10/24/2018 2018   K 4.2 04/09/2014 1759   CL 103 10/24/2018 2018   CL 112 (H) 04/09/2014 1759   CO2 26 10/24/2018 2018   CO2 27 04/09/2014 1759   GLUCOSE 134 (H) 10/24/2018 2018   GLUCOSE 117 (H) 04/09/2014 1759   BUN 31 (H) 10/24/2018 2018   BUN 30 (H) 04/09/2014 1759   CREATININE 1.62 (H) 10/24/2018 2018   CREATININE 1.61 (H) 04/09/2014 1759   CALCIUM 8.7 (L) 10/24/2018 2018   CALCIUM 8.6 04/09/2014 1759   PROT 6.3 (L) 10/24/2018 2018   PROT 6.8 04/09/2014 1759   ALBUMIN 3.8 10/24/2018 2018   ALBUMIN 3.5 04/09/2014 1759   AST 23 10/24/2018 2018   AST 34 04/09/2014 1759   ALT 28 10/24/2018 2018   ALT 35  04/09/2014 1759   ALKPHOS 43 10/24/2018 2018   ALKPHOS 62 04/09/2014 1759   BILITOT 0.5 10/24/2018 2018   BILITOT 0.3 04/09/2014 1759   GFRNONAA 37 (L) 10/24/2018 2018   GFRNONAA 39 (L) 04/09/2014 1759   GFRAA 43 (L) 10/24/2018 2018   GFRAA 45 (L) 04/09/2014 1759   Lab Results  Component Value Date   CHOL 114 03/22/2014   HDL 38 (L) 03/22/2014   LDLCALC 61 03/22/2014   TRIG 76 03/22/2014   No results found for: HGBA1C No results found for: VITAMINB12 Lab Results  Component Value Date   TSH 4.69 (H) 03/22/2014      ASSESSMENT AND PLAN 85 y.o. year old male  has a past medical history of Alzheimer disease ([place]) (07/31/2018), Angina pectoris ([place]), Arthritis, Cardiomyopathy ([place]), COPD (chronic obstructive pulmonary disease) ([place]), Gastroesophageal reflux disease,  History of colon polyps, Hyperlipidemia, Hypertension, Obesity, Osteoporosis, Polymyalgia rheumatica ([place]), Prostate cancer ([place]), Rheumatoid arthritis ([place]), and Sleep apnea. here with:  1.  Progressive memory disturbance 2.  Generalized weakness 3. Gait abnormality  He has had a decline in his memory score was 12/30 today, was 23/30 in October 2019.  His wife reports over the last several days he has exhibited generalized weakness.  She has noticed over the last several months, his gait has become more shuffling, with short strides.  He has not been running a fever or having cough.  I will check routine lab and rule out underlying infection including a CBC with differential, CMP, and urinalysis.  I will also check a Depakote level and ammonia level.  Upon asking the patient, he reports that he feels fine, he does not report any pain or discomfort.  I have suggested they discuss with her primary care doctor regarding this.  If he does not improve, he may need a COVID testing.  We will decrease his dose of Aricept to 5 mg at bedtime, due to the report of underlying diarrhea and nasal drip. I am uncertain the nature of his gait  change but am concerned that over the past few months his wife has reported a change in his gait to have a shorter stride, shuffling, and forward leaning. He will return in 2-3 months to see [name] in regards to this. He is on low dose of Risperdal 0.5 mg at bedtime. I will update his wife once labs result.   I spent 25 minutes with the patient. 50% of this time was spent discussing his plan of care.   Butler Denmark, AGNP-C, DNP 07/15/2019, 12:49 PM [facility] [address], [place], [place] [address] [phone]

## 2019-07-15 ENCOUNTER — Encounter: Payer: Self-pay | Admitting: Neurology

## 2019-07-15 ENCOUNTER — Other Ambulatory Visit: Payer: Self-pay

## 2019-07-15 ENCOUNTER — Ambulatory Visit (INDEPENDENT_AMBULATORY_CARE_PROVIDER_SITE_OTHER): Payer: Medicare Other | Admitting: Neurology

## 2019-07-15 VITALS — BP 114/78 | HR 68 | Temp 97.8°F | Ht 70.0 in | Wt 193.8 lb

## 2019-07-15 DIAGNOSIS — G300 Alzheimer's disease with early onset: Secondary | ICD-10-CM | POA: Diagnosis not present

## 2019-07-15 DIAGNOSIS — R531 Weakness: Secondary | ICD-10-CM | POA: Diagnosis not present

## 2019-07-15 DIAGNOSIS — R269 Unspecified abnormalities of gait and mobility: Secondary | ICD-10-CM | POA: Diagnosis not present

## 2019-07-15 DIAGNOSIS — F0281 Dementia in other diseases classified elsewhere with behavioral disturbance: Secondary | ICD-10-CM

## 2019-07-15 MED ORDER — DONEPEZIL HCL 5 MG PO TABS: 5.0000 mg | ORAL_TABLET | Freq: Every day | ORAL | 5 refills | Status: DC

## 2019-07-15 NOTE — Patient Instructions (Addendum)
1. We will decrease the Aricept to 5 mg at bedtime 2. We will get lab work today 3. Please call your primary doctor to discuss concern for weakness

## 2019-07-15 NOTE — Progress Notes (Signed)
I have read the note, and I agree with the clinical assessment and plan.  Wisdom Seybold K Sidni Fusco   

## 2019-07-16 ENCOUNTER — Telehealth: Payer: Self-pay | Admitting: Neurology

## 2019-07-16 DIAGNOSIS — R531 Weakness: Secondary | ICD-10-CM | POA: Diagnosis not present

## 2019-07-16 LAB — COMPREHENSIVE METABOLIC PANEL
ALT: 37 IU/L (ref 0–44)
AST: 28 IU/L (ref 0–40)
Albumin/Globulin Ratio: 2.1 (ref 1.2–2.2)
Albumin: 4 g/dL (ref 3.6–4.6)
Alkaline Phosphatase: 60 IU/L (ref 39–117)
BUN/Creatinine Ratio: 23 (ref 10–24)
BUN: 35 mg/dL — ABNORMAL HIGH (ref 8–27)
Bilirubin Total: 0.3 mg/dL (ref 0.0–1.2)
CO2: 25 mmol/L (ref 20–29)
Calcium: 9.3 mg/dL (ref 8.6–10.2)
Chloride: 106 mmol/L (ref 96–106)
Creatinine, Ser: 1.49 mg/dL — ABNORMAL HIGH (ref 0.76–1.27)
GFR calc Af Amer: 47 mL/min/{1.73_m2} — ABNORMAL LOW (ref >59)
GFR calc non Af Amer: 41 mL/min/{1.73_m2} — ABNORMAL LOW (ref >59)
Globulin, Total: 1.9 g/dL (ref 1.5–4.5)
Glucose: 75 mg/dL (ref 65–99)
Potassium: 4.4 mmol/L (ref 3.5–5.2)
Sodium: 144 mmol/L (ref 134–144)
Total Protein: 5.9 g/dL — ABNORMAL LOW (ref 6.0–8.5)

## 2019-07-16 LAB — CBC WITH DIFFERENTIAL/PLATELET
Basophils Absolute: 0.1 10*3/uL (ref 0.0–0.2)
Basos: 1 %
EOS (ABSOLUTE): 0.2 10*3/uL (ref 0.0–0.4)
Eos: 3 %
Hematocrit: 35.5 % — ABNORMAL LOW (ref 37.5–51.0)
Hemoglobin: 12.1 g/dL — ABNORMAL LOW (ref 13.0–17.7)
Immature Grans (Abs): 0 10*3/uL (ref 0.0–0.1)
Immature Granulocytes: 0 %
Lymphocytes Absolute: 1.7 10*3/uL (ref 0.7–3.1)
Lymphs: 20 %
MCH: 32.1 pg (ref 26.6–33.0)
MCHC: 34.1 g/dL (ref 31.5–35.7)
MCV: 94 fL (ref 79–97)
Monocytes Absolute: 0.9 10*3/uL (ref 0.1–0.9)
Monocytes: 10 %
Neutrophils Absolute: 5.5 10*3/uL (ref 1.4–7.0)
Neutrophils: 66 %
Platelets: 192 10*3/uL (ref 150–450)
RBC: 3.77 x10E6/uL — ABNORMAL LOW (ref 4.14–5.80)
RDW: 12.4 % (ref 11.6–15.4)
WBC: 8.3 10*3/uL (ref 3.4–10.8)

## 2019-07-16 LAB — AMMONIA: Ammonia: 29 ug/dL (ref 28–135)

## 2019-07-16 LAB — VALPROIC ACID LEVEL: Valproic Acid Lvl: 40 ug/mL — ABNORMAL LOW (ref 50–100)

## 2019-07-16 NOTE — Telephone Encounter (Signed)
Can you see if you can get this patient in to see [name] in the next few weeks for evaluation of his gait change over the last few months? I called his wife and she was agreeable.

## 2019-07-16 NOTE — Telephone Encounter (Signed)
Noted  

## 2019-07-16 NOTE — Telephone Encounter (Signed)
I contacted the pt's wife and advised we could see the pt on 08/13/2019 at 11:00.

## 2019-07-16 NOTE — Telephone Encounter (Signed)
I called his wife. So far, labs look stable, ammonia is pending. Stable low HGB 12.1, NO elevated WBC, stable elevated creatinine 1.49, low depakote level is expected only taking 500 mg at bedtime. His wife will be bringing a urine sample for drop off today.

## 2019-07-17 LAB — URINALYSIS, ROUTINE W REFLEX MICROSCOPIC
Bilirubin, UA: NEGATIVE
Glucose, UA: NEGATIVE
Ketones, UA: NEGATIVE
Leukocytes,UA: NEGATIVE
Nitrite, UA: NEGATIVE
Protein,UA: NEGATIVE
RBC, UA: NEGATIVE
Specific Gravity, UA: 1.021 (ref 1.005–1.030)
Urobilinogen, Ur: 0.2 mg/dL (ref 0.2–1.0)
pH, UA: 6.5 (ref 5.0–7.5)

## 2019-07-24 DIAGNOSIS — R2689 Other abnormalities of gait and mobility: Secondary | ICD-10-CM | POA: Diagnosis not present

## 2019-07-24 DIAGNOSIS — R2681 Unsteadiness on feet: Secondary | ICD-10-CM | POA: Diagnosis not present

## 2019-07-24 DIAGNOSIS — M6281 Muscle weakness (generalized): Secondary | ICD-10-CM | POA: Diagnosis not present

## 2019-07-29 DIAGNOSIS — R2681 Unsteadiness on feet: Secondary | ICD-10-CM | POA: Diagnosis not present

## 2019-07-29 DIAGNOSIS — M6281 Muscle weakness (generalized): Secondary | ICD-10-CM | POA: Diagnosis not present

## 2019-07-29 DIAGNOSIS — R2689 Other abnormalities of gait and mobility: Secondary | ICD-10-CM | POA: Diagnosis not present

## 2019-08-06 DIAGNOSIS — R2681 Unsteadiness on feet: Secondary | ICD-10-CM | POA: Diagnosis not present

## 2019-08-06 DIAGNOSIS — M6281 Muscle weakness (generalized): Secondary | ICD-10-CM | POA: Diagnosis not present

## 2019-08-06 DIAGNOSIS — R2689 Other abnormalities of gait and mobility: Secondary | ICD-10-CM | POA: Diagnosis not present

## 2019-08-07 DIAGNOSIS — M6281 Muscle weakness (generalized): Secondary | ICD-10-CM | POA: Diagnosis not present

## 2019-08-07 DIAGNOSIS — R2681 Unsteadiness on feet: Secondary | ICD-10-CM | POA: Diagnosis not present

## 2019-08-07 DIAGNOSIS — R2689 Other abnormalities of gait and mobility: Secondary | ICD-10-CM | POA: Diagnosis not present

## 2019-08-10 DIAGNOSIS — F0281 Dementia in other diseases classified elsewhere with behavioral disturbance: Secondary | ICD-10-CM | POA: Diagnosis not present

## 2019-08-12 DIAGNOSIS — R2681 Unsteadiness on feet: Secondary | ICD-10-CM | POA: Diagnosis not present

## 2019-08-12 DIAGNOSIS — M6281 Muscle weakness (generalized): Secondary | ICD-10-CM | POA: Diagnosis not present

## 2019-08-12 DIAGNOSIS — R2689 Other abnormalities of gait and mobility: Secondary | ICD-10-CM | POA: Diagnosis not present

## 2019-08-13 ENCOUNTER — Ambulatory Visit (INDEPENDENT_AMBULATORY_CARE_PROVIDER_SITE_OTHER): Payer: Medicare Other | Admitting: Neurology

## 2019-08-13 ENCOUNTER — Telehealth: Payer: Self-pay | Admitting: Neurology

## 2019-08-13 ENCOUNTER — Encounter: Payer: Self-pay | Admitting: Neurology

## 2019-08-13 ENCOUNTER — Other Ambulatory Visit: Payer: Self-pay

## 2019-08-13 VITALS — BP 117/69 | HR 63 | Temp 97.8°F | Ht 70.0 in | Wt 190.0 lb

## 2019-08-13 DIAGNOSIS — G300 Alzheimer's disease with early onset: Secondary | ICD-10-CM

## 2019-08-13 DIAGNOSIS — F0281 Dementia in other diseases classified elsewhere with behavioral disturbance: Secondary | ICD-10-CM

## 2019-08-13 DIAGNOSIS — G2111 Neuroleptic induced parkinsonism: Secondary | ICD-10-CM

## 2019-08-13 DIAGNOSIS — R269 Unspecified abnormalities of gait and mobility: Secondary | ICD-10-CM | POA: Diagnosis not present

## 2019-08-13 DIAGNOSIS — F02818 Dementia in other diseases classified elsewhere, unspecified severity, with other behavioral disturbance: Secondary | ICD-10-CM

## 2019-08-13 DIAGNOSIS — G219 Secondary parkinsonism, unspecified: Secondary | ICD-10-CM

## 2019-08-13 DIAGNOSIS — T43505A Adverse effect of unspecified antipsychotics and neuroleptics, initial encounter: Secondary | ICD-10-CM

## 2019-08-13 HISTORY — DX: Secondary parkinsonism, unspecified: G21.9

## 2019-08-13 MED ORDER — QUETIAPINE FUMARATE 25 MG PO TABS: 25.0000 mg | ORAL_TABLET | Freq: Every day | ORAL | 3 refills | Status: DC

## 2019-08-13 NOTE — Patient Instructions (Signed)
We will stop the risperidal, we will start seroquel 25 mg at night.

## 2019-08-13 NOTE — Telephone Encounter (Signed)
Pt's wife called wanting to know if the daughter can also go in for the pts appt. She was explained that office policies due to [place] is one person can go back but she wants RN to call back to discuss allowing the daughter to go back as well. Please advise.

## 2019-08-13 NOTE — Progress Notes (Signed)
Reason for visit: Alzheimer's disease, gait disorder  Devin Vance is an 85 y.o. male  History of present illness:  Devin Vance is an 85 year old right-handed white male with a history of Alzheimer's disease that has progressed fairly significantly over the last year.  The patient had some issues with agitation earlier in the year necessitating a psychiatric admission to [facility].  The patient was placed on Risperdal at that time taking 0.5 mg in the evening hours.  Over the last 2 months he has developed a change in his ability to walk, he has become more stooped, he is shuffling his feet, he has developed a whispery voice.  He has not had any falls, he is not drooling.  He is having a lot of problems with a runny nose on Aricept, he also takes Namenda.  He sleeps a lot during the day and night.  He has had problems with fecal and urinary incontinence, he has to get up twice during the night to go to the bathroom.  At times he may not be oriented to his own home.  He comes to this office for an evaluation.  He apparently is not having hallucinations.  Past Medical History:  Diagnosis Date  . Alzheimer disease ([place]) 07/31/2018  . Angina pectoris ([place])   . Arthritis   . Cardiomyopathy ([place])   . COPD (chronic obstructive pulmonary disease) ([place])   . Gastroesophageal reflux disease   . History of colon polyps   . Hyperlipidemia   . Hypertension   . Obesity   . Osteoporosis   . Polymyalgia rheumatica ([place])   . Prostate cancer ([place])   . Rheumatoid arthritis ([place])   . Sleep apnea     Past Surgical History:  Procedure Laterality Date  . CATARACT EXTRACTION    . CORONARY ANGIOPLASTY    . HEMORRHOIDECTOMY WITH HEMORRHOID BANDING    . PROSTATECTOMY    . STRABISMUS SURGERY    . TONSILLECTOMY      Family History  Problem Relation Age of Onset  . Stroke Mother   . Dementia Mother   . Cancer Father   . Dementia Sister   . Dementia Maternal Aunt     Social history:  reports  that he has never smoked. He has never used smokeless tobacco. He reports previous alcohol use. He reports previous drug use.    Allergies  Allergen Reactions  . Lorazepam Other (See Comments)    Generalized weakness    Medications:  Prior to Admission medications   Medication Sig Start Date End Date Taking? Authorizing Provider  aspirin EC 81 MG tablet Take by mouth.   Yes [provider]  Calcium Carbonate-Vitamin D 600-400 MG-UNIT tablet Take by mouth.   Yes [provider]  chlorhexidine (PERIDEX) 0.12 % solution  03/20/19  Yes [provider]  Cholecalciferol (VITAMIN D-3) 125 MCG (5000 UT) TABS Take 1 tablet by mouth every morning.   Yes [provider]  divalproex (DEPAKOTE) 500 MG DR tablet Take 500 mg by mouth at bedtime.   Yes [provider]  donepezil (ARICEPT) 5 MG tablet Take 1 tablet (5 mg total) by mouth at bedtime. 07/15/19  Yes Suzzanne Cloud, NP  Ferrous Sulfate (IRON PO) Take 65 mg by mouth.   Yes [provider]  hydroxychloroquine (PLAQUENIL) 200 MG tablet Take 200 mg by mouth daily. 06/11/19  Yes [provider]  ketoconazole (NIZORAL) 2 % shampoo  02/13/19  Yes [provider]  Lactobacillus (PROBIOTIC ACIDOPHILUS PO) Take 1 tablet by mouth daily.   Yes [provider]  Lutein 20 MG CAPS Take 40 mg by mouth daily.    Yes [provider]  memantine (NAMENDA) 10 MG tablet Take 1 tablet (10 mg total) by mouth 2 (two) times daily. 01/12/19  Yes Suzzanne Cloud, NP  Multiple Vitamin (MULTIVITAMIN) capsule Take by mouth.   Yes [provider]  Omega-3 Fatty Acids (FISH OIL) 1200 MG CAPS Take by mouth.   Yes [provider]  ramipril (ALTACE) 5 MG capsule Take 5 mg by mouth daily.   Yes [provider]  risperiDONE (RISPERDAL) 0.5 MG tablet Take 0.5 mg by mouth at bedtime.   Yes [provider]  rosuvastatin (CRESTOR) 40 MG tablet  03/13/19  Yes [provider]  metoprolol succinate (TOPROL-XL) 25 MG 24 hr tablet Take by mouth. 08/20/16 10/24/18  [provider]    ROS:  Out of a complete 14 system review of symptoms, the patient complains only of the following symptoms, and all other reviewed systems are negative.  Walking difficulty Memory disturbance Runny nose  Blood pressure 117/69, pulse 63, temperature 97.8 F (36.6 C), temperature source Temporal, height 5\' 10"  (1.778 m), weight 190 lb (86.2 kg).  Physical Exam  General: The patient is alert and cooperative at the time of the examination.  Skin: No significant peripheral edema is noted.   Neurologic Exam  Mental status: The patient is alert and oriented x 1 at the time of the examination, not oriented to date or place. The patient is minimally verbal, wispy voice is noted.   Cranial nerves: Facial symmetry is present. Speech is hypophonic, not dysarthric. Extraocular movements are full. Visual fields are full.  Motor: The patient has good strength in all 4 extremities.  Sensory examination: Soft touch sensation is symmetric on the face, arms, and legs.  Coordination: The patient has good finger-nose-finger and heel-to-shin bilaterally.  The patient has very minimal issues with apraxia.  Gait and station: The patient has a stooped posture, decreased arm swing with walking, he takes relatively good stride otherwise.  Romberg is negative.  Reflexes: Deep tendon reflexes are symmetric.   Assessment/Plan:  1.  Alzheimer's disease  2.  Gait disorder, secondary parkinsonism  The patient appears to have developed parkinsonian features on Risperdal, the medication will be discontinued and he will be switched to Seroquel taking 25 mg at night.  The family need to watch out for any increased problems with agitation.  He is followed through psychiatry.  He will follow-up otherwise in 4 months.  If he does not improve with his ability to ambulate within the  next 6 weeks, he may actually have Parkinson's disease.  The family will contact me regarding this.   Jill Alexanders MD 08/13/2019 11:05 AM  [facility] [address] [place], [place] [address]  Phone [phone] Fax [phone]

## 2019-08-13 NOTE — Telephone Encounter (Signed)
Wife advised daughter can come back.

## 2019-08-14 DIAGNOSIS — R2681 Unsteadiness on feet: Secondary | ICD-10-CM | POA: Diagnosis not present

## 2019-08-14 DIAGNOSIS — M6281 Muscle weakness (generalized): Secondary | ICD-10-CM | POA: Diagnosis not present

## 2019-08-14 DIAGNOSIS — R2689 Other abnormalities of gait and mobility: Secondary | ICD-10-CM | POA: Diagnosis not present

## 2019-08-19 DIAGNOSIS — R2681 Unsteadiness on feet: Secondary | ICD-10-CM | POA: Diagnosis not present

## 2019-08-19 DIAGNOSIS — M6281 Muscle weakness (generalized): Secondary | ICD-10-CM | POA: Diagnosis not present

## 2019-08-19 DIAGNOSIS — R2689 Other abnormalities of gait and mobility: Secondary | ICD-10-CM | POA: Diagnosis not present

## 2019-08-21 DIAGNOSIS — R2689 Other abnormalities of gait and mobility: Secondary | ICD-10-CM | POA: Diagnosis not present

## 2019-08-21 DIAGNOSIS — M6281 Muscle weakness (generalized): Secondary | ICD-10-CM | POA: Diagnosis not present

## 2019-08-21 DIAGNOSIS — R2681 Unsteadiness on feet: Secondary | ICD-10-CM | POA: Diagnosis not present

## 2019-08-24 DIAGNOSIS — G309 Alzheimer's disease, unspecified: Secondary | ICD-10-CM | POA: Diagnosis not present

## 2019-08-24 DIAGNOSIS — I2510 Atherosclerotic heart disease of native coronary artery without angina pectoris: Secondary | ICD-10-CM | POA: Diagnosis not present

## 2019-08-24 DIAGNOSIS — J449 Chronic obstructive pulmonary disease, unspecified: Secondary | ICD-10-CM | POA: Diagnosis not present

## 2019-08-24 DIAGNOSIS — R2681 Unsteadiness on feet: Secondary | ICD-10-CM | POA: Diagnosis not present

## 2019-08-24 DIAGNOSIS — M353 Polymyalgia rheumatica: Secondary | ICD-10-CM | POA: Diagnosis not present

## 2019-08-24 DIAGNOSIS — G629 Polyneuropathy, unspecified: Secondary | ICD-10-CM | POA: Diagnosis not present

## 2019-08-24 DIAGNOSIS — R2689 Other abnormalities of gait and mobility: Secondary | ICD-10-CM | POA: Diagnosis not present

## 2019-08-24 DIAGNOSIS — M6281 Muscle weakness (generalized): Secondary | ICD-10-CM | POA: Diagnosis not present

## 2019-08-24 DIAGNOSIS — E785 Hyperlipidemia, unspecified: Secondary | ICD-10-CM | POA: Diagnosis not present

## 2019-08-24 DIAGNOSIS — I10 Essential (primary) hypertension: Secondary | ICD-10-CM | POA: Diagnosis not present

## 2019-08-24 DIAGNOSIS — R7303 Prediabetes: Secondary | ICD-10-CM | POA: Diagnosis not present

## 2019-08-24 DIAGNOSIS — F39 Unspecified mood [affective] disorder: Secondary | ICD-10-CM | POA: Diagnosis not present

## 2019-08-24 DIAGNOSIS — N1831 Chronic kidney disease, stage 3a: Secondary | ICD-10-CM | POA: Diagnosis not present

## 2019-08-24 DIAGNOSIS — C61 Malignant neoplasm of prostate: Secondary | ICD-10-CM | POA: Diagnosis not present

## 2019-08-25 DIAGNOSIS — E7800 Pure hypercholesterolemia, unspecified: Secondary | ICD-10-CM | POA: Diagnosis not present

## 2019-08-25 DIAGNOSIS — R945 Abnormal results of liver function studies: Secondary | ICD-10-CM | POA: Diagnosis not present

## 2019-08-26 DIAGNOSIS — M6281 Muscle weakness (generalized): Secondary | ICD-10-CM | POA: Diagnosis not present

## 2019-08-26 DIAGNOSIS — R2681 Unsteadiness on feet: Secondary | ICD-10-CM | POA: Diagnosis not present

## 2019-08-26 DIAGNOSIS — R2689 Other abnormalities of gait and mobility: Secondary | ICD-10-CM | POA: Diagnosis not present

## 2019-09-01 DIAGNOSIS — L821 Other seborrheic keratosis: Secondary | ICD-10-CM | POA: Diagnosis not present

## 2019-09-01 DIAGNOSIS — D225 Melanocytic nevi of trunk: Secondary | ICD-10-CM | POA: Diagnosis not present

## 2019-09-01 DIAGNOSIS — R2689 Other abnormalities of gait and mobility: Secondary | ICD-10-CM | POA: Diagnosis not present

## 2019-09-01 DIAGNOSIS — M6281 Muscle weakness (generalized): Secondary | ICD-10-CM | POA: Diagnosis not present

## 2019-09-01 DIAGNOSIS — L218 Other seborrheic dermatitis: Secondary | ICD-10-CM | POA: Diagnosis not present

## 2019-09-01 DIAGNOSIS — R2681 Unsteadiness on feet: Secondary | ICD-10-CM | POA: Diagnosis not present

## 2019-09-01 DIAGNOSIS — D2371 Other benign neoplasm of skin of right lower limb, including hip: Secondary | ICD-10-CM | POA: Diagnosis not present

## 2019-09-01 DIAGNOSIS — D1801 Hemangioma of skin and subcutaneous tissue: Secondary | ICD-10-CM | POA: Diagnosis not present

## 2019-09-04 DIAGNOSIS — R2681 Unsteadiness on feet: Secondary | ICD-10-CM | POA: Diagnosis not present

## 2019-09-04 DIAGNOSIS — R2689 Other abnormalities of gait and mobility: Secondary | ICD-10-CM | POA: Diagnosis not present

## 2019-09-04 DIAGNOSIS — M6281 Muscle weakness (generalized): Secondary | ICD-10-CM | POA: Diagnosis not present

## 2019-09-07 DIAGNOSIS — R2681 Unsteadiness on feet: Secondary | ICD-10-CM | POA: Diagnosis not present

## 2019-09-07 DIAGNOSIS — R2689 Other abnormalities of gait and mobility: Secondary | ICD-10-CM | POA: Diagnosis not present

## 2019-09-07 DIAGNOSIS — M6281 Muscle weakness (generalized): Secondary | ICD-10-CM | POA: Diagnosis not present

## 2019-09-09 DIAGNOSIS — R2689 Other abnormalities of gait and mobility: Secondary | ICD-10-CM | POA: Diagnosis not present

## 2019-09-09 DIAGNOSIS — R2681 Unsteadiness on feet: Secondary | ICD-10-CM | POA: Diagnosis not present

## 2019-09-09 DIAGNOSIS — M6281 Muscle weakness (generalized): Secondary | ICD-10-CM | POA: Diagnosis not present

## 2019-09-21 ENCOUNTER — Ambulatory Visit: Payer: Medicare Other | Admitting: Neurology

## 2019-09-21 ENCOUNTER — Telehealth: Payer: Self-pay | Admitting: Neurology

## 2019-09-21 NOTE — Telephone Encounter (Signed)
I called the patient.  The patient still on the 10 mg Aricept, the get this through [facility], the 5 mg tablets were called into [facility] but the patient never got this prescription.  We will have her cut the 10 mg tablets in half taking 1/2 tablet.  He has done much better coming off Risperdal and switching over to Seroquel.

## 2019-09-21 NOTE — Telephone Encounter (Signed)
Nirvaan Castiglia ( wife) called in and stated since the med change on 10/29 pt has been doing well and the change has helped a lot , but she wants to know if its ok to lessen the donepezil (ARICEPT) 5 MG tablet.

## 2019-10-20 DIAGNOSIS — M6281 Muscle weakness (generalized): Secondary | ICD-10-CM | POA: Diagnosis not present

## 2019-10-20 DIAGNOSIS — R2681 Unsteadiness on feet: Secondary | ICD-10-CM | POA: Diagnosis not present

## 2019-10-20 DIAGNOSIS — R2689 Other abnormalities of gait and mobility: Secondary | ICD-10-CM | POA: Diagnosis not present

## 2019-10-22 DIAGNOSIS — R2689 Other abnormalities of gait and mobility: Secondary | ICD-10-CM | POA: Diagnosis not present

## 2019-10-22 DIAGNOSIS — R2681 Unsteadiness on feet: Secondary | ICD-10-CM | POA: Diagnosis not present

## 2019-10-22 DIAGNOSIS — M1990 Unspecified osteoarthritis, unspecified site: Secondary | ICD-10-CM | POA: Diagnosis not present

## 2019-10-22 DIAGNOSIS — M6281 Muscle weakness (generalized): Secondary | ICD-10-CM | POA: Diagnosis not present

## 2019-10-27 DIAGNOSIS — R2681 Unsteadiness on feet: Secondary | ICD-10-CM | POA: Diagnosis not present

## 2019-10-27 DIAGNOSIS — R2689 Other abnormalities of gait and mobility: Secondary | ICD-10-CM | POA: Diagnosis not present

## 2019-10-27 DIAGNOSIS — M6281 Muscle weakness (generalized): Secondary | ICD-10-CM | POA: Diagnosis not present

## 2019-11-02 DIAGNOSIS — Z23 Encounter for immunization: Secondary | ICD-10-CM | POA: Diagnosis not present

## 2019-11-06 DIAGNOSIS — F0281 Dementia in other diseases classified elsewhere with behavioral disturbance: Secondary | ICD-10-CM | POA: Diagnosis not present

## 2019-11-30 DIAGNOSIS — Z23 Encounter for immunization: Secondary | ICD-10-CM | POA: Diagnosis not present

## 2019-12-10 ENCOUNTER — Ambulatory Visit (INDEPENDENT_AMBULATORY_CARE_PROVIDER_SITE_OTHER): Payer: Medicare Other | Admitting: Neurology

## 2019-12-10 ENCOUNTER — Encounter: Payer: Self-pay | Admitting: Neurology

## 2019-12-10 ENCOUNTER — Other Ambulatory Visit: Payer: Self-pay

## 2019-12-10 VITALS — BP 117/69 | HR 67 | Temp 97.0°F | Ht 70.0 in | Wt 194.0 lb

## 2019-12-10 DIAGNOSIS — F0281 Dementia in other diseases classified elsewhere with behavioral disturbance: Secondary | ICD-10-CM | POA: Diagnosis not present

## 2019-12-10 DIAGNOSIS — G300 Alzheimer's disease with early onset: Secondary | ICD-10-CM | POA: Diagnosis not present

## 2019-12-10 DIAGNOSIS — R269 Unspecified abnormalities of gait and mobility: Secondary | ICD-10-CM

## 2019-12-10 DIAGNOSIS — F02818 Dementia in other diseases classified elsewhere, unspecified severity, with other behavioral disturbance: Secondary | ICD-10-CM

## 2019-12-10 MED ORDER — MEMANTINE HCL 10 MG PO TABS: 10.0000 mg | ORAL_TABLET | Freq: Two times a day (BID) | ORAL | 3 refills | Status: DC

## 2019-12-10 NOTE — Progress Notes (Addendum)
PATIENT: Devin Vance DOB: [birth year]  REASON FOR VISIT: follow up HISTORY FROM: patient  HISTORY OF PRESENT ILLNESS: Today 12/10/19  Devin Vance is a 86 year old male with history of Alzheimer's disease that has progressed fairly significantly.  Last year he developed a change in his ability to walk, he became more stooped, shuffling his feet, having a whispering voice.  At last visit Risperdal was discontinued and he was switched to Seroquel. His wife reports he is doing much better, more alert, interactive, engaged.  His gait has improved some, but still has a stooped, shuffling gait.  He has had 1 fall, as result of leaning over to pick something up.  His voice is still low, she says it has been this way for years.  He is not agitated or hallucinating.  He walks with a walker, but is able to walk independently.  They live at [facility], he is able to go down the hall, take the elevator upstairs, pick up their meals, and return back to the room.  He continues to have some nasal drip, intermittent diarrhea, has not seen much change with a dose decrease of Aricept.  He presents today with his wife.  HISTORY 08/13/2019 [name]: Mr. Abler is an 86 year old right-handed white male with a history of Alzheimer's disease that has progressed fairly significantly over the last year.  The patient had some issues with agitation earlier in the year necessitating a psychiatric admission to [facility].  The patient was placed on Risperdal at that time taking 0.5 mg in the evening hours.  Over the last 2 months he has developed a change in his ability to walk, he has become more stooped, he is shuffling his feet, he has developed a whispery voice.  He has not had any falls, he is not drooling.  He is having a lot of problems with a runny nose on Aricept, he also takes Namenda.  He sleeps a lot during the day and night.  He has had problems with fecal and urinary incontinence, he has to  get up twice during the night to go to the bathroom.  At times he may not be oriented to his own home.  He comes to this office for an evaluation.  He apparently is not having hallucinations.   REVIEW OF SYSTEMS: Out of a complete 14 system review of symptoms, the patient complains only of the following symptoms, and all other reviewed systems are negative.  Gait abnormality, memory loss  ALLERGIES: Allergies  Allergen Reactions  . Lorazepam Other (See Comments)    Generalized weakness    HOME MEDICATIONS: Outpatient Medications Prior to Visit  Medication Sig Dispense Refill  . aspirin EC 81 MG tablet Take by mouth.    . Calcium Carbonate-Vitamin D 600-400 MG-UNIT tablet Take by mouth.    . chlorhexidine (PERIDEX) 0.12 % solution     . Cholecalciferol (VITAMIN D-3) 125 MCG (5000 UT) TABS Take 1 tablet by mouth every morning.    . divalproex (DEPAKOTE) 500 MG DR tablet Take 500 mg by mouth at bedtime.    . Ferrous Sulfate (IRON PO) Take 65 mg by mouth.    . hydroxychloroquine (PLAQUENIL) 200 MG tablet Take 200 mg by mouth daily.    Marland Kitchen ketoconazole (NIZORAL) 2 % shampoo     . Lactobacillus (PROBIOTIC ACIDOPHILUS PO) Take 1 tablet by mouth daily.    . Lutein 20 MG CAPS Take 40 mg by mouth daily.     Marland Kitchen  Multiple Vitamin (MULTIVITAMIN) capsule Take by mouth.    . Omega-3 Fatty Acids (FISH OIL) 1200 MG CAPS Take by mouth.    . QUEtiapine (SEROQUEL) 25 MG tablet Take 1 tablet (25 mg total) by mouth at bedtime. 30 tablet 3  . ramipril (ALTACE) 5 MG capsule Take 5 mg by mouth daily.    . rosuvastatin (CRESTOR) 40 MG tablet     . donepezil (ARICEPT) 5 MG tablet Take 1 tablet (5 mg total) by mouth at bedtime. 30 tablet 5  . memantine (NAMENDA) 10 MG tablet Take 1 tablet (10 mg total) by mouth 2 (two) times daily. 180 tablet 3  . metoprolol succinate (TOPROL-XL) 25 MG 24 hr tablet Take by mouth.     No facility-administered medications prior to visit.    PAST MEDICAL HISTORY: Past Medical  History:  Diagnosis Date  . Alzheimer disease ([place]) 07/31/2018  . Angina pectoris ([place])   . Arthritis   . Cardiomyopathy ([place])   . COPD (chronic obstructive pulmonary disease) ([place])   . Gastroesophageal reflux disease   . History of colon polyps   . Hyperlipidemia   . Hypertension   . Obesity   . Osteoporosis   . Polymyalgia rheumatica ([place])   . Prostate cancer ([place])   . Rheumatoid arthritis ([place])   . Secondary Parkinson disease ([place]) 08/13/2019  . Sleep apnea     PAST SURGICAL HISTORY: Past Surgical History:  Procedure Laterality Date  . CATARACT EXTRACTION    . CORONARY ANGIOPLASTY    . HEMORRHOIDECTOMY WITH HEMORRHOID BANDING    . PROSTATECTOMY    . STRABISMUS SURGERY    . TONSILLECTOMY      FAMILY HISTORY: Family History  Problem Relation Age of Onset  . Stroke Mother   . Dementia Mother   . Cancer Father   . Dementia Sister   . Dementia Maternal Aunt     SOCIAL HISTORY: Social History   Socioeconomic History  . Marital status: Married    Spouse name: Not on file  . Number of children: Not on file  . Years of education: Not on file  . Highest education level: Not on file  Occupational History  . Not on file  Tobacco Use  . Smoking status: Never Smoker  . Smokeless tobacco: Never Used  Substance and Sexual Activity  . Alcohol use: Not Currently  . Drug use: Not Currently  . Sexual activity: Not on file  Other Topics Concern  . Not on file  Social History Narrative  . Not on file   Social Determinants of Health   Financial Resource Strain:   . Difficulty of Paying Living Expenses: Not on file  Food Insecurity:   . Worried About Charity fundraiser in the Last Year: Not on file  . Ran Out of Food in the Last Year: Not on file  Transportation Needs:   . Lack of Transportation (Medical): Not on file  . Lack of Transportation (Non-Medical): Not on file  Physical Activity:   . Days of Exercise per Week: Not on file  . Minutes of Exercise per  Session: Not on file  Stress:   . Feeling of Stress : Not on file  Social Connections:   . Frequency of Communication with Friends and Family: Not on file  . Frequency of Social Gatherings with Friends and Family: Not on file  . Attends Religious Services: Not on file  . Active Member of Clubs or Organizations: Not on file  .  Attends Archivist Meetings: Not on file  . Marital Status: Not on file  Intimate Partner Violence:   . Fear of Current or Ex-Partner: Not on file  . Emotionally Abused: Not on file  . Physically Abused: Not on file  . Sexually Abused: Not on file   PHYSICAL EXAM  Vitals:   12/10/19 1443  BP: 117/69  Pulse: 67  Temp: (!) 97 F (36.1 C)  Weight: 194 lb (88 kg)  Height: 5\' 10"  (1.778 m)   Body mass index is 27.84 kg/m.  Generalized: Well developed, in no acute distress  MMSE - Mini Mental State Exam 12/10/2019 07/15/2019 07/31/2018  Not completed: Unable to complete (No Data) -  Orientation to time 0 3 4  Orientation to Place - 0 3  Registration - 3 3  Attention/ Calculation - 0 5  Recall - 0 0  Language- name 2 objects - 2 2  Language- repeat - 0 1  Language- follow 3 step command - 2 2  Language- follow 3 step command-comments - he held the paper in the air after he folded it -  Language- read & follow direction - 1 1  Write a sentence - 1 1  Copy design - 0 1  Total score - 12 23    Neurological examination  Mentation: Alert, oriented to date of birth, place, person.  Most of history is provided by his wife.  Follows all commands speech and language fluent, does have a soft voice Cranial nerve II-XII: Pupils were equal round reactive to light. Extraocular movements were full, visual field were full on confrontational test. Facial sensation and strength were normal. Head turning and shoulder shrug  were normal and symmetric. Motor: Good strength of all extremities, no resting tremor noted Sensory: Sensory testing is intact to soft  touch on all 4 extremities. No evidence of extinction is noted.  Coordination: Cerebellar testing reveals good finger-nose-finger and heel-to-shin bilaterally.  Mild intention tremor bilaterally. Gait and station: Takes a few tries to stand up with pushoff, able to use a stool to get on exam table, with gait, stooped posture, shuffling gait, normal arm swing. Reflexes: Deep tendon reflexes are symmetric  DIAGNOSTIC DATA (LABS, IMAGING, TESTING) - I reviewed patient records, labs, notes, testing and imaging myself where available.  Lab Results  Component Value Date   WBC 8.3 07/15/2019   HGB 12.1 (L) 07/15/2019   HCT 35.5 (L) 07/15/2019   MCV 94 07/15/2019   PLT 192 07/15/2019      Component Value Date/Time   NA 144 07/15/2019 1407   NA 145 04/09/2014 1759   K 4.4 07/15/2019 1407   K 4.2 04/09/2014 1759   CL 106 07/15/2019 1407   CL 112 (H) 04/09/2014 1759   CO2 25 07/15/2019 1407   CO2 27 04/09/2014 1759   GLUCOSE 75 07/15/2019 1407   GLUCOSE 134 (H) 10/24/2018 2018   GLUCOSE 117 (H) 04/09/2014 1759   BUN 35 (H) 07/15/2019 1407   BUN 30 (H) 04/09/2014 1759   CREATININE 1.49 (H) 07/15/2019 1407   CREATININE 1.61 (H) 04/09/2014 1759   CALCIUM 9.3 07/15/2019 1407   CALCIUM 8.6 04/09/2014 1759   PROT 5.9 (L) 07/15/2019 1407   PROT 6.8 04/09/2014 1759   ALBUMIN 4.0 07/15/2019 1407   ALBUMIN 3.5 04/09/2014 1759   AST 28 07/15/2019 1407   AST 34 04/09/2014 1759   ALT 37 07/15/2019 1407   ALT 35 04/09/2014 1759   ALKPHOS 60 07/15/2019  [id number] 04/09/2014 1759   BILITOT 0.3 07/15/2019 1407   BILITOT 0.3 04/09/2014 1759   GFRNONAA 41 (L) 07/15/2019 1407   GFRNONAA 39 (L) 04/09/2014 1759   GFRAA 47 (L) 07/15/2019 1407   GFRAA 45 (L) 04/09/2014 1759   Lab Results  Component Value Date   CHOL 114 03/22/2014   HDL 38 (L) 03/22/2014   LDLCALC 61 03/22/2014   TRIG 76 03/22/2014   No results found for: HGBA1C No results found for: VITAMINB12 Lab Results    Component Value Date   TSH 4.69 (H) 03/22/2014   ASSESSMENT AND PLAN 86 y.o. year old male  has a past medical history of Alzheimer disease ([place]) (07/31/2018), Angina pectoris ([place]), Arthritis, Cardiomyopathy ([place]), COPD (chronic obstructive pulmonary disease) ([place]), Gastroesophageal reflux disease, History of colon polyps, Hyperlipidemia, Hypertension, Obesity, Osteoporosis, Polymyalgia rheumatica ([place]), Prostate cancer ([place]), Rheumatoid arthritis ([place]), Secondary Parkinson disease ([place]) (08/13/2019), and Sleep apnea. here with:  1.  Alzheimer's disease 2.  Gait disorder  His mental status has improved significantly off the Risperdal.  He will remain on Seroquel 25 mg at bedtime.  His gait has remained the same, as far as shuffling, stooped posture, he does also have a very soft/whispery voice.  The question is raised, could it be Parkinson's disease.  I had a discussion with his wife, she says this has been considered in the differential for several years, but he has not been tried on Parkinson's medication.  I will discuss with [name], determine whether he feels a trial of possibly Sinemet may be in the patient's best interest.  They will follow-up in the office in 4 months or sooner if needed.  I will discontinue the Aricept, due to report of nasal drip and diarrhea.  He will remain on Namenda.  Addendum 12/15/2019: I talked with [name] will not initiate PD medications at this time, since improving so much with the discontinuation of Risperdal. We will follow him overtime. I called his wife, she agrees.   I spent 25 minutes with the patient. 50% of this time was spent discussing his plan of care.   Butler Denmark, AGNP-C, DNP 12/10/2019, 3:21 PM [facility] [address], [place], [place] [address] [phone]

## 2019-12-10 NOTE — Patient Instructions (Addendum)
Stop the Aricept, continue taking the Namenda Let's see you back in 4 months

## 2019-12-11 NOTE — Progress Notes (Signed)
I have read the note, and I agree with the clinical assessment and plan.  Jaion Lagrange K Bennett Ram   

## 2019-12-23 ENCOUNTER — Telehealth: Payer: Self-pay | Admitting: Neurology

## 2019-12-23 MED ORDER — QUETIAPINE FUMARATE 25 MG PO TABS: 25.0000 mg | ORAL_TABLET | Freq: Every day | ORAL | 1 refills | Status: DC

## 2019-12-23 NOTE — Telephone Encounter (Signed)
[name] on [place] called wanting to know if the pt can get his QUEtiapine (SEROQUEL) 25 MG tablet in a 90 day supply due to it being hard for her to get the refills every month. Pt is out at this moment. Please advise.

## 2019-12-23 NOTE — Telephone Encounter (Signed)
Patient has FU. Refilled seroquel 90 day supply with 1 refill.

## 2020-01-14 DIAGNOSIS — M1990 Unspecified osteoarthritis, unspecified site: Secondary | ICD-10-CM | POA: Diagnosis not present

## 2020-01-14 DIAGNOSIS — M353 Polymyalgia rheumatica: Secondary | ICD-10-CM | POA: Diagnosis not present

## 2020-01-20 DIAGNOSIS — Z20828 Contact with and (suspected) exposure to other viral communicable diseases: Secondary | ICD-10-CM | POA: Diagnosis not present

## 2020-01-20 DIAGNOSIS — Z1159 Encounter for screening for other viral diseases: Secondary | ICD-10-CM | POA: Diagnosis not present

## 2020-01-27 DIAGNOSIS — Z1159 Encounter for screening for other viral diseases: Secondary | ICD-10-CM | POA: Diagnosis not present

## 2020-01-27 DIAGNOSIS — Z20828 Contact with and (suspected) exposure to other viral communicable diseases: Secondary | ICD-10-CM | POA: Diagnosis not present

## 2020-02-01 DIAGNOSIS — F0281 Dementia in other diseases classified elsewhere with behavioral disturbance: Secondary | ICD-10-CM | POA: Diagnosis not present

## 2020-02-03 DIAGNOSIS — Z20828 Contact with and (suspected) exposure to other viral communicable diseases: Secondary | ICD-10-CM | POA: Diagnosis not present

## 2020-02-03 DIAGNOSIS — Z1159 Encounter for screening for other viral diseases: Secondary | ICD-10-CM | POA: Diagnosis not present

## 2020-02-03 DIAGNOSIS — L245 Irritant contact dermatitis due to other chemical products: Secondary | ICD-10-CM | POA: Diagnosis not present

## 2020-02-10 DIAGNOSIS — Z1159 Encounter for screening for other viral diseases: Secondary | ICD-10-CM | POA: Diagnosis not present

## 2020-02-10 DIAGNOSIS — Z20828 Contact with and (suspected) exposure to other viral communicable diseases: Secondary | ICD-10-CM | POA: Diagnosis not present

## 2020-02-11 DIAGNOSIS — L0211 Cutaneous abscess of neck: Secondary | ICD-10-CM | POA: Diagnosis not present

## 2020-02-11 DIAGNOSIS — L720 Epidermal cyst: Secondary | ICD-10-CM | POA: Diagnosis not present

## 2020-02-17 DIAGNOSIS — Z1159 Encounter for screening for other viral diseases: Secondary | ICD-10-CM | POA: Diagnosis not present

## 2020-02-17 DIAGNOSIS — Z20828 Contact with and (suspected) exposure to other viral communicable diseases: Secondary | ICD-10-CM | POA: Diagnosis not present

## 2020-02-24 DIAGNOSIS — Z1159 Encounter for screening for other viral diseases: Secondary | ICD-10-CM | POA: Diagnosis not present

## 2020-02-24 DIAGNOSIS — Z20828 Contact with and (suspected) exposure to other viral communicable diseases: Secondary | ICD-10-CM | POA: Diagnosis not present

## 2020-02-26 DIAGNOSIS — I779 Disorder of arteries and arterioles, unspecified: Secondary | ICD-10-CM | POA: Diagnosis not present

## 2020-02-26 DIAGNOSIS — G309 Alzheimer's disease, unspecified: Secondary | ICD-10-CM | POA: Diagnosis not present

## 2020-02-26 DIAGNOSIS — Z1389 Encounter for screening for other disorder: Secondary | ICD-10-CM | POA: Diagnosis not present

## 2020-02-26 DIAGNOSIS — C61 Malignant neoplasm of prostate: Secondary | ICD-10-CM | POA: Diagnosis not present

## 2020-02-26 DIAGNOSIS — I2510 Atherosclerotic heart disease of native coronary artery without angina pectoris: Secondary | ICD-10-CM | POA: Diagnosis not present

## 2020-02-26 DIAGNOSIS — I10 Essential (primary) hypertension: Secondary | ICD-10-CM | POA: Diagnosis not present

## 2020-02-26 DIAGNOSIS — G4733 Obstructive sleep apnea (adult) (pediatric): Secondary | ICD-10-CM | POA: Diagnosis not present

## 2020-02-26 DIAGNOSIS — R7309 Other abnormal glucose: Secondary | ICD-10-CM | POA: Diagnosis not present

## 2020-02-26 DIAGNOSIS — J449 Chronic obstructive pulmonary disease, unspecified: Secondary | ICD-10-CM | POA: Diagnosis not present

## 2020-02-26 DIAGNOSIS — M810 Age-related osteoporosis without current pathological fracture: Secondary | ICD-10-CM | POA: Diagnosis not present

## 2020-02-26 DIAGNOSIS — I429 Cardiomyopathy, unspecified: Secondary | ICD-10-CM | POA: Diagnosis not present

## 2020-02-26 DIAGNOSIS — E538 Deficiency of other specified B group vitamins: Secondary | ICD-10-CM | POA: Diagnosis not present

## 2020-02-26 DIAGNOSIS — Z0000 Encounter for general adult medical examination without abnormal findings: Secondary | ICD-10-CM | POA: Diagnosis not present

## 2020-03-02 DIAGNOSIS — Z1159 Encounter for screening for other viral diseases: Secondary | ICD-10-CM | POA: Diagnosis not present

## 2020-03-02 DIAGNOSIS — Z20828 Contact with and (suspected) exposure to other viral communicable diseases: Secondary | ICD-10-CM | POA: Diagnosis not present

## 2020-03-09 DIAGNOSIS — Z1159 Encounter for screening for other viral diseases: Secondary | ICD-10-CM | POA: Diagnosis not present

## 2020-03-09 DIAGNOSIS — Z20828 Contact with and (suspected) exposure to other viral communicable diseases: Secondary | ICD-10-CM | POA: Diagnosis not present

## 2020-03-16 DIAGNOSIS — Z1159 Encounter for screening for other viral diseases: Secondary | ICD-10-CM | POA: Diagnosis not present

## 2020-03-16 DIAGNOSIS — Z20828 Contact with and (suspected) exposure to other viral communicable diseases: Secondary | ICD-10-CM | POA: Diagnosis not present

## 2020-04-19 DIAGNOSIS — M1990 Unspecified osteoarthritis, unspecified site: Secondary | ICD-10-CM | POA: Diagnosis not present

## 2020-04-19 DIAGNOSIS — M353 Polymyalgia rheumatica: Secondary | ICD-10-CM | POA: Diagnosis not present

## 2020-04-20 DIAGNOSIS — Z1159 Encounter for screening for other viral diseases: Secondary | ICD-10-CM | POA: Diagnosis not present

## 2020-04-20 DIAGNOSIS — Z20828 Contact with and (suspected) exposure to other viral communicable diseases: Secondary | ICD-10-CM | POA: Diagnosis not present

## 2020-04-21 ENCOUNTER — Other Ambulatory Visit: Payer: Self-pay | Admitting: Neurology

## 2020-04-21 ENCOUNTER — Telehealth: Payer: Self-pay | Admitting: Neurology

## 2020-04-21 ENCOUNTER — Encounter: Payer: Self-pay | Admitting: Neurology

## 2020-04-21 ENCOUNTER — Other Ambulatory Visit: Payer: Self-pay

## 2020-04-21 ENCOUNTER — Telehealth: Payer: Self-pay

## 2020-04-21 ENCOUNTER — Ambulatory Visit (INDEPENDENT_AMBULATORY_CARE_PROVIDER_SITE_OTHER): Payer: Medicare Other | Admitting: Neurology

## 2020-04-21 VITALS — BP 105/68 | HR 64 | Ht 70.0 in | Wt 185.0 lb

## 2020-04-21 DIAGNOSIS — F0281 Dementia in other diseases classified elsewhere with behavioral disturbance: Secondary | ICD-10-CM | POA: Diagnosis not present

## 2020-04-21 DIAGNOSIS — R269 Unspecified abnormalities of gait and mobility: Secondary | ICD-10-CM

## 2020-04-21 DIAGNOSIS — G301 Alzheimer's disease with late onset: Secondary | ICD-10-CM

## 2020-04-21 MED ORDER — DIVALPROEX SODIUM 250 MG PO DR TAB: 250.0000 mg | DELAYED_RELEASE_TABLET | Freq: Every day | ORAL | 0 refills | Status: DC

## 2020-04-21 MED ORDER — DIVALPROEX SODIUM 250 MG PO DR TAB: DELAYED_RELEASE_TABLET | ORAL | 0 refills | Status: DC

## 2020-04-21 NOTE — Telephone Encounter (Signed)
..   Pt understands that although there may be some limitations with this type of visit, we will take all precautions to reduce any security or privacy concerns.  Pt understands that this will be treated like an in office visit and we will file with pt's insurance, and there may be a patient responsible charge related to this service. ? ?

## 2020-04-21 NOTE — Patient Instructions (Signed)
We will reduce the Depakote to 250 mg at night, do this for 4 weeks, then stop if he is doing well.

## 2020-04-21 NOTE — Telephone Encounter (Signed)
If pts wife call back please schedule pt with Judson Roch NP in January 2022 for a 6 month follow up. This is per Df.WIlis note today.  I left a voice mail for wife to call back and schedule 6 month follow up.

## 2020-04-21 NOTE — Progress Notes (Signed)
Reason for visit: Alzheimer's disease, gait disorder  Devin Vance is an 86 y.o. male  History of present illness:  Devin Vance is a 86 year old right-handed white male with a history of a progressive dementing illness consistent with Alzheimer's disease.  The patient has had episodes of agitation in the past, he is currently on Depakote 500 mg at night and Seroquel 25 mg at night.  The wife indicates that he sleeps well at night but also sleeps all day long.  He is minimally active, he has not had any hallucinations or agitation.  He walks with a walker for longer distances.  He did have some parkinsonian features with Risperdal, his walking has improved some stopping the medication but he still take short shuffling steps.  He recently was reduced on his metoprolol dose due to some reduction in blood pressures.  He returns for an evaluation.  Past Medical History:  Diagnosis Date  . Alzheimer disease ([place]) 07/31/2018  . Angina pectoris ([place])   . Arthritis   . Cardiomyopathy ([place])   . COPD (chronic obstructive pulmonary disease) ([place])   . Gastroesophageal reflux disease   . History of colon polyps   . Hyperlipidemia   . Hypertension   . Obesity   . Osteoporosis   . Polymyalgia rheumatica ([place])   . Prostate cancer ([place])   . Rheumatoid arthritis ([place])   . Secondary Parkinson disease ([place]) 08/13/2019  . Sleep apnea     Past Surgical History:  Procedure Laterality Date  . CATARACT EXTRACTION    . CORONARY ANGIOPLASTY    . HEMORRHOIDECTOMY WITH HEMORRHOID BANDING    . PROSTATECTOMY    . STRABISMUS SURGERY    . TONSILLECTOMY      Family History  Problem Relation Age of Onset  . Stroke Mother   . Dementia Mother   . Cancer Father   . Dementia Sister   . Dementia Maternal Aunt     Social history:  reports that he has never smoked. He has never used smokeless tobacco. He reports previous alcohol use. He reports previous drug use.    Allergies  Allergen Reactions  .  Lorazepam Other (See Comments)    Generalized weakness    Medications:  Prior to Admission medications   Medication Sig Start Date End Date Taking? Authorizing Provider  aspirin EC 81 MG tablet Take by mouth.   Yes [provider]  CALCIUM CITRATE PO Take 300 mg by mouth.   Yes [provider]  Cholecalciferol (VITAMIN D-3 PO) Take 1,000 units of lipase by mouth.   Yes [provider]  divalproex (DEPAKOTE) 500 MG DR tablet Take 500 mg by mouth at bedtime.   Yes [provider]  hydroxychloroquine (PLAQUENIL) 200 MG tablet Take 200 mg by mouth daily. One pill 200mg  every other day , other days two pills 06/11/19  Yes [provider]  ketoconazole (NIZORAL) 2 % shampoo  02/13/19  Yes [provider]  Lactobacillus (PROBIOTIC ACIDOPHILUS PO) Take 1 tablet by mouth daily.   Yes [provider]  Lutein 20 MG CAPS Take 40 mg by mouth daily.    Yes [provider]  memantine (NAMENDA) 10 MG tablet Take 1 tablet (10 mg total) by mouth 2 (two) times daily. 12/10/19  Yes Suzzanne Cloud, NP  metoprolol succinate (TOPROL-XL) 25 MG 24 hr tablet Take 12.5 mg by mouth.  08/20/16 04/21/20 Yes [provider]  Multiple Vitamin (MULTIVITAMIN) capsule Take by mouth.  Yes [provider]  Omega-3 Fatty Acids (FISH OIL) 1200 MG CAPS Take by mouth.   Yes [provider]  QUEtiapine (SEROQUEL) 25 MG tablet Take 1 tablet (25 mg total) by mouth at bedtime. 12/23/19  Yes Suzzanne Cloud, NP  rosuvastatin (CRESTOR) 10 MG tablet Take 10 mg by mouth at bedtime. 04/19/20  Yes [provider]    ROS:  Out of a complete 14 system review of symptoms, the patient complains only of the following symptoms, and all other reviewed systems are negative.  Memory problems Excessive drowsiness Walking difficulty  Blood pressure 105/68, pulse 64, height 5\' 10"  (1.778 m), weight 185 lb (83.9 kg).  Physical Exam  General: The  patient is alert and cooperative at the time of the examination.  Skin: No significant peripheral edema is noted.   Neurologic Exam  Mental status: The patient is alert and cooperative at the time of the examination.    Cranial nerves: Facial symmetry is present. Speech is normal, no aphasia or dysarthria is noted. Extraocular movements are full. Visual fields are full.  Some masking of the face is seen.  Motor: The patient has good strength in all 4 extremities.  Sensory examination: Soft touch sensation is symmetric on the face, arms, and legs.  Coordination: The patient has good finger-nose-finger and heel-to-shin bilaterally.  Gait and station: The patient has some difficulty arising from a seated position, he can stand up on his own.  He has a stooped posture, some decreased arm swing bilaterally, no tremors are seen.  He takes short shuffling steps.  Reflexes: Deep tendon reflexes are symmetric.   Assessment/Plan:  1.  Alzheimer's disease  2.  Gait disorder  The patient does have some features of parkinsonism but I would not consider starting medications for Parkinson's disease at this time.  The patient will be reduced on the Depakote to a 250 mg tablet at night for a month and if he does well with this we will stop the drug.  We may be able to cut back on the Seroquel to one half of the 25 mg tab in the evening if he continues to be quite drowsy during the day.  He is also having some episodes of intermittent diarrhea alternating with constipation.  He will follow-up here in 6 months.  Jill Alexanders MD 04/21/2020 12:33 PM  [facility] [address] [place], [place] [address]  Phone [phone] Fax [phone]

## 2020-04-21 NOTE — Addendum Note (Signed)
Addended by: Kathrynn Ducking on: 04/21/2020 05:45 PM   Modules accepted: Orders

## 2020-04-21 NOTE — Telephone Encounter (Signed)
Pt wife has called , pt's 6 month f/u has been scheduled 10-24-20 with Sarah,NP

## 2020-04-23 ENCOUNTER — Other Ambulatory Visit: Payer: Self-pay | Admitting: Neurology

## 2020-04-27 DIAGNOSIS — Z20828 Contact with and (suspected) exposure to other viral communicable diseases: Secondary | ICD-10-CM | POA: Diagnosis not present

## 2020-04-27 DIAGNOSIS — Z1159 Encounter for screening for other viral diseases: Secondary | ICD-10-CM | POA: Diagnosis not present

## 2020-04-28 DIAGNOSIS — E039 Hypothyroidism, unspecified: Secondary | ICD-10-CM | POA: Diagnosis not present

## 2020-04-28 DIAGNOSIS — F0281 Dementia in other diseases classified elsewhere with behavioral disturbance: Secondary | ICD-10-CM | POA: Diagnosis not present

## 2020-05-04 DIAGNOSIS — Z20828 Contact with and (suspected) exposure to other viral communicable diseases: Secondary | ICD-10-CM | POA: Diagnosis not present

## 2020-05-04 DIAGNOSIS — Z1159 Encounter for screening for other viral diseases: Secondary | ICD-10-CM | POA: Diagnosis not present

## 2020-05-11 DIAGNOSIS — Z20828 Contact with and (suspected) exposure to other viral communicable diseases: Secondary | ICD-10-CM | POA: Diagnosis not present

## 2020-05-11 DIAGNOSIS — Z1159 Encounter for screening for other viral diseases: Secondary | ICD-10-CM | POA: Diagnosis not present

## 2020-05-18 DIAGNOSIS — Z1159 Encounter for screening for other viral diseases: Secondary | ICD-10-CM | POA: Diagnosis not present

## 2020-05-18 DIAGNOSIS — Z20828 Contact with and (suspected) exposure to other viral communicable diseases: Secondary | ICD-10-CM | POA: Diagnosis not present

## 2020-05-25 DIAGNOSIS — Z20828 Contact with and (suspected) exposure to other viral communicable diseases: Secondary | ICD-10-CM | POA: Diagnosis not present

## 2020-05-25 DIAGNOSIS — Z1159 Encounter for screening for other viral diseases: Secondary | ICD-10-CM | POA: Diagnosis not present

## 2020-06-01 DIAGNOSIS — Z20828 Contact with and (suspected) exposure to other viral communicable diseases: Secondary | ICD-10-CM | POA: Diagnosis not present

## 2020-06-01 DIAGNOSIS — Z1159 Encounter for screening for other viral diseases: Secondary | ICD-10-CM | POA: Diagnosis not present

## 2020-06-07 ENCOUNTER — Other Ambulatory Visit: Payer: Self-pay | Admitting: *Deleted

## 2020-06-07 MED ORDER — QUETIAPINE FUMARATE 25 MG PO TABS: ORAL_TABLET | ORAL | 5 refills | Status: DC

## 2020-06-08 DIAGNOSIS — Z1159 Encounter for screening for other viral diseases: Secondary | ICD-10-CM | POA: Diagnosis not present

## 2020-06-08 DIAGNOSIS — Z20828 Contact with and (suspected) exposure to other viral communicable diseases: Secondary | ICD-10-CM | POA: Diagnosis not present

## 2020-06-15 DIAGNOSIS — Z1159 Encounter for screening for other viral diseases: Secondary | ICD-10-CM | POA: Diagnosis not present

## 2020-06-15 DIAGNOSIS — Z20828 Contact with and (suspected) exposure to other viral communicable diseases: Secondary | ICD-10-CM | POA: Diagnosis not present

## 2020-06-22 DIAGNOSIS — Z20828 Contact with and (suspected) exposure to other viral communicable diseases: Secondary | ICD-10-CM | POA: Diagnosis not present

## 2020-06-22 DIAGNOSIS — Z1159 Encounter for screening for other viral diseases: Secondary | ICD-10-CM | POA: Diagnosis not present

## 2020-06-25 DIAGNOSIS — Z23 Encounter for immunization: Secondary | ICD-10-CM | POA: Diagnosis not present

## 2020-06-28 DIAGNOSIS — R7303 Prediabetes: Secondary | ICD-10-CM | POA: Diagnosis not present

## 2020-06-28 DIAGNOSIS — G309 Alzheimer's disease, unspecified: Secondary | ICD-10-CM | POA: Diagnosis not present

## 2020-06-28 DIAGNOSIS — I2510 Atherosclerotic heart disease of native coronary artery without angina pectoris: Secondary | ICD-10-CM | POA: Diagnosis not present

## 2020-06-28 DIAGNOSIS — I10 Essential (primary) hypertension: Secondary | ICD-10-CM | POA: Diagnosis not present

## 2020-06-28 DIAGNOSIS — F39 Unspecified mood [affective] disorder: Secondary | ICD-10-CM | POA: Diagnosis not present

## 2020-06-28 DIAGNOSIS — J449 Chronic obstructive pulmonary disease, unspecified: Secondary | ICD-10-CM | POA: Diagnosis not present

## 2020-06-28 DIAGNOSIS — E785 Hyperlipidemia, unspecified: Secondary | ICD-10-CM | POA: Diagnosis not present

## 2020-06-28 DIAGNOSIS — E039 Hypothyroidism, unspecified: Secondary | ICD-10-CM | POA: Diagnosis not present

## 2020-06-28 DIAGNOSIS — N183 Chronic kidney disease, stage 3 unspecified: Secondary | ICD-10-CM | POA: Diagnosis not present

## 2020-06-28 DIAGNOSIS — I011 Acute rheumatic endocarditis: Secondary | ICD-10-CM | POA: Diagnosis not present

## 2020-06-29 DIAGNOSIS — Z20828 Contact with and (suspected) exposure to other viral communicable diseases: Secondary | ICD-10-CM | POA: Diagnosis not present

## 2020-06-29 DIAGNOSIS — Z1159 Encounter for screening for other viral diseases: Secondary | ICD-10-CM | POA: Diagnosis not present

## 2020-06-30 DIAGNOSIS — Z23 Encounter for immunization: Secondary | ICD-10-CM | POA: Diagnosis not present

## 2020-07-04 ENCOUNTER — Telehealth: Payer: Self-pay | Admitting: Neurology

## 2020-07-04 NOTE — Telephone Encounter (Signed)
Pt's wife,Nina Randa called, may be taking a trip soon. Need advice on whether Pt would be able to go a 200 mile for his [place] reunion. Would like the opinion of [name] or Judson Roch whether he would be able to make the trip. Would like a call from the nurse today.

## 2020-07-05 DIAGNOSIS — Z1159 Encounter for screening for other viral diseases: Secondary | ICD-10-CM | POA: Diagnosis not present

## 2020-07-05 DIAGNOSIS — Z20828 Contact with and (suspected) exposure to other viral communicable diseases: Secondary | ICD-10-CM | POA: Diagnosis not present

## 2020-07-05 NOTE — Telephone Encounter (Signed)
I called the wife.  With traveling, there is always a risk of increased confusion and agitation, the patient is doing well currently.  They will be with family, the patient only be at the convention for 1 day and then return.  They can always cancel out if agitation worsens.  The wife cannot be alone.  Okay for travel.

## 2020-07-08 DIAGNOSIS — Z20828 Contact with and (suspected) exposure to other viral communicable diseases: Secondary | ICD-10-CM | POA: Diagnosis not present

## 2020-07-08 DIAGNOSIS — Z1159 Encounter for screening for other viral diseases: Secondary | ICD-10-CM | POA: Diagnosis not present

## 2020-07-12 ENCOUNTER — Telehealth: Payer: Self-pay | Admitting: Neurology

## 2020-07-12 DIAGNOSIS — Z1159 Encounter for screening for other viral diseases: Secondary | ICD-10-CM | POA: Diagnosis not present

## 2020-07-12 DIAGNOSIS — Z20828 Contact with and (suspected) exposure to other viral communicable diseases: Secondary | ICD-10-CM | POA: Diagnosis not present

## 2020-07-12 NOTE — Telephone Encounter (Signed)
Called, LVM for wife letting her know prescription was called in on 06/07/20 #30, 5 refills. Should have refills at the pharmacy. She should contact pharmacy for refills. Advised her to call back if anything else is needed.

## 2020-07-12 NOTE — Telephone Encounter (Signed)
Pt's wife called stating that the pt is needing a refill on his QUEtiapine (SEROQUEL) 25 MG tablet sent in to the [facility] on [address].  Wife would like to be called once the prescription has been called in to the pharmacy.

## 2020-07-15 DIAGNOSIS — Z20828 Contact with and (suspected) exposure to other viral communicable diseases: Secondary | ICD-10-CM | POA: Diagnosis not present

## 2020-07-15 DIAGNOSIS — Z1159 Encounter for screening for other viral diseases: Secondary | ICD-10-CM | POA: Diagnosis not present

## 2020-07-26 DIAGNOSIS — Z20828 Contact with and (suspected) exposure to other viral communicable diseases: Secondary | ICD-10-CM | POA: Diagnosis not present

## 2020-07-26 DIAGNOSIS — Z1159 Encounter for screening for other viral diseases: Secondary | ICD-10-CM | POA: Diagnosis not present

## 2020-07-27 DIAGNOSIS — F0281 Dementia in other diseases classified elsewhere with behavioral disturbance: Secondary | ICD-10-CM | POA: Diagnosis not present

## 2020-07-29 DIAGNOSIS — Z1159 Encounter for screening for other viral diseases: Secondary | ICD-10-CM | POA: Diagnosis not present

## 2020-07-29 DIAGNOSIS — Z20828 Contact with and (suspected) exposure to other viral communicable diseases: Secondary | ICD-10-CM | POA: Diagnosis not present

## 2020-08-02 DIAGNOSIS — Z1159 Encounter for screening for other viral diseases: Secondary | ICD-10-CM | POA: Diagnosis not present

## 2020-08-02 DIAGNOSIS — Z20828 Contact with and (suspected) exposure to other viral communicable diseases: Secondary | ICD-10-CM | POA: Diagnosis not present

## 2020-08-05 DIAGNOSIS — Z1159 Encounter for screening for other viral diseases: Secondary | ICD-10-CM | POA: Diagnosis not present

## 2020-08-05 DIAGNOSIS — Z20828 Contact with and (suspected) exposure to other viral communicable diseases: Secondary | ICD-10-CM | POA: Diagnosis not present

## 2020-08-09 DIAGNOSIS — Z20828 Contact with and (suspected) exposure to other viral communicable diseases: Secondary | ICD-10-CM | POA: Diagnosis not present

## 2020-08-09 DIAGNOSIS — Z1159 Encounter for screening for other viral diseases: Secondary | ICD-10-CM | POA: Diagnosis not present

## 2020-08-12 DIAGNOSIS — Z20828 Contact with and (suspected) exposure to other viral communicable diseases: Secondary | ICD-10-CM | POA: Diagnosis not present

## 2020-08-12 DIAGNOSIS — Z1159 Encounter for screening for other viral diseases: Secondary | ICD-10-CM | POA: Diagnosis not present

## 2020-08-16 DIAGNOSIS — Z1159 Encounter for screening for other viral diseases: Secondary | ICD-10-CM | POA: Diagnosis not present

## 2020-08-16 DIAGNOSIS — Z20828 Contact with and (suspected) exposure to other viral communicable diseases: Secondary | ICD-10-CM | POA: Diagnosis not present

## 2020-08-19 DIAGNOSIS — Z1159 Encounter for screening for other viral diseases: Secondary | ICD-10-CM | POA: Diagnosis not present

## 2020-08-19 DIAGNOSIS — Z20828 Contact with and (suspected) exposure to other viral communicable diseases: Secondary | ICD-10-CM | POA: Diagnosis not present

## 2020-08-23 DIAGNOSIS — Z1159 Encounter for screening for other viral diseases: Secondary | ICD-10-CM | POA: Diagnosis not present

## 2020-08-23 DIAGNOSIS — Z20828 Contact with and (suspected) exposure to other viral communicable diseases: Secondary | ICD-10-CM | POA: Diagnosis not present

## 2020-08-26 DIAGNOSIS — Z20828 Contact with and (suspected) exposure to other viral communicable diseases: Secondary | ICD-10-CM | POA: Diagnosis not present

## 2020-08-26 DIAGNOSIS — Z1159 Encounter for screening for other viral diseases: Secondary | ICD-10-CM | POA: Diagnosis not present

## 2020-08-30 DIAGNOSIS — Z20828 Contact with and (suspected) exposure to other viral communicable diseases: Secondary | ICD-10-CM | POA: Diagnosis not present

## 2020-08-30 DIAGNOSIS — Z1159 Encounter for screening for other viral diseases: Secondary | ICD-10-CM | POA: Diagnosis not present

## 2020-09-01 DIAGNOSIS — D2262 Melanocytic nevi of left upper limb, including shoulder: Secondary | ICD-10-CM | POA: Diagnosis not present

## 2020-09-01 DIAGNOSIS — D225 Melanocytic nevi of trunk: Secondary | ICD-10-CM | POA: Diagnosis not present

## 2020-09-01 DIAGNOSIS — L218 Other seborrheic dermatitis: Secondary | ICD-10-CM | POA: Diagnosis not present

## 2020-09-01 DIAGNOSIS — D1721 Benign lipomatous neoplasm of skin and subcutaneous tissue of right arm: Secondary | ICD-10-CM | POA: Diagnosis not present

## 2020-09-01 DIAGNOSIS — L821 Other seborrheic keratosis: Secondary | ICD-10-CM | POA: Diagnosis not present

## 2020-09-02 DIAGNOSIS — Z20828 Contact with and (suspected) exposure to other viral communicable diseases: Secondary | ICD-10-CM | POA: Diagnosis not present

## 2020-09-02 DIAGNOSIS — Z1159 Encounter for screening for other viral diseases: Secondary | ICD-10-CM | POA: Diagnosis not present

## 2020-09-06 DIAGNOSIS — Z20828 Contact with and (suspected) exposure to other viral communicable diseases: Secondary | ICD-10-CM | POA: Diagnosis not present

## 2020-09-06 DIAGNOSIS — Z1159 Encounter for screening for other viral diseases: Secondary | ICD-10-CM | POA: Diagnosis not present

## 2020-09-13 DIAGNOSIS — Z20828 Contact with and (suspected) exposure to other viral communicable diseases: Secondary | ICD-10-CM | POA: Diagnosis not present

## 2020-09-13 DIAGNOSIS — Z1159 Encounter for screening for other viral diseases: Secondary | ICD-10-CM | POA: Diagnosis not present

## 2020-09-20 DIAGNOSIS — Z20828 Contact with and (suspected) exposure to other viral communicable diseases: Secondary | ICD-10-CM | POA: Diagnosis not present

## 2020-09-20 DIAGNOSIS — Z1159 Encounter for screening for other viral diseases: Secondary | ICD-10-CM | POA: Diagnosis not present

## 2020-09-23 DIAGNOSIS — Z1159 Encounter for screening for other viral diseases: Secondary | ICD-10-CM | POA: Diagnosis not present

## 2020-09-23 DIAGNOSIS — Z20828 Contact with and (suspected) exposure to other viral communicable diseases: Secondary | ICD-10-CM | POA: Diagnosis not present

## 2020-09-27 DIAGNOSIS — Z20828 Contact with and (suspected) exposure to other viral communicable diseases: Secondary | ICD-10-CM | POA: Diagnosis not present

## 2020-09-27 DIAGNOSIS — Z1159 Encounter for screening for other viral diseases: Secondary | ICD-10-CM | POA: Diagnosis not present

## 2020-09-30 DIAGNOSIS — Z1159 Encounter for screening for other viral diseases: Secondary | ICD-10-CM | POA: Diagnosis not present

## 2020-09-30 DIAGNOSIS — Z20828 Contact with and (suspected) exposure to other viral communicable diseases: Secondary | ICD-10-CM | POA: Diagnosis not present

## 2020-10-03 DIAGNOSIS — Z79899 Other long term (current) drug therapy: Secondary | ICD-10-CM | POA: Diagnosis not present

## 2020-10-03 DIAGNOSIS — M0689 Other specified rheumatoid arthritis, multiple sites: Secondary | ICD-10-CM | POA: Diagnosis not present

## 2020-10-03 DIAGNOSIS — H43813 Vitreous degeneration, bilateral: Secondary | ICD-10-CM | POA: Diagnosis not present

## 2020-10-03 DIAGNOSIS — Z961 Presence of intraocular lens: Secondary | ICD-10-CM | POA: Diagnosis not present

## 2020-10-04 DIAGNOSIS — Z1159 Encounter for screening for other viral diseases: Secondary | ICD-10-CM | POA: Diagnosis not present

## 2020-10-04 DIAGNOSIS — Z20828 Contact with and (suspected) exposure to other viral communicable diseases: Secondary | ICD-10-CM | POA: Diagnosis not present

## 2020-10-11 DIAGNOSIS — Z20828 Contact with and (suspected) exposure to other viral communicable diseases: Secondary | ICD-10-CM | POA: Diagnosis not present

## 2020-10-11 DIAGNOSIS — Z1159 Encounter for screening for other viral diseases: Secondary | ICD-10-CM | POA: Diagnosis not present

## 2020-10-14 DIAGNOSIS — Z1159 Encounter for screening for other viral diseases: Secondary | ICD-10-CM | POA: Diagnosis not present

## 2020-10-14 DIAGNOSIS — Z20828 Contact with and (suspected) exposure to other viral communicable diseases: Secondary | ICD-10-CM | POA: Diagnosis not present

## 2020-10-18 DIAGNOSIS — Z20828 Contact with and (suspected) exposure to other viral communicable diseases: Secondary | ICD-10-CM | POA: Diagnosis not present

## 2020-10-18 DIAGNOSIS — Z1159 Encounter for screening for other viral diseases: Secondary | ICD-10-CM | POA: Diagnosis not present

## 2020-10-20 DIAGNOSIS — M1990 Unspecified osteoarthritis, unspecified site: Secondary | ICD-10-CM | POA: Diagnosis not present

## 2020-10-20 DIAGNOSIS — M353 Polymyalgia rheumatica: Secondary | ICD-10-CM | POA: Diagnosis not present

## 2020-10-20 DIAGNOSIS — M8949 Other hypertrophic osteoarthropathy, multiple sites: Secondary | ICD-10-CM | POA: Diagnosis not present

## 2020-10-21 DIAGNOSIS — Z1159 Encounter for screening for other viral diseases: Secondary | ICD-10-CM | POA: Diagnosis not present

## 2020-10-21 DIAGNOSIS — Z20828 Contact with and (suspected) exposure to other viral communicable diseases: Secondary | ICD-10-CM | POA: Diagnosis not present

## 2020-10-24 ENCOUNTER — Ambulatory Visit: Payer: Medicare Other | Admitting: Neurology

## 2020-10-24 DIAGNOSIS — Z20828 Contact with and (suspected) exposure to other viral communicable diseases: Secondary | ICD-10-CM | POA: Diagnosis not present

## 2020-10-25 ENCOUNTER — Ambulatory Visit (INDEPENDENT_AMBULATORY_CARE_PROVIDER_SITE_OTHER): Payer: Medicare Other | Admitting: Neurology

## 2020-10-25 ENCOUNTER — Other Ambulatory Visit: Payer: Self-pay

## 2020-10-25 ENCOUNTER — Encounter: Payer: Self-pay | Admitting: Neurology

## 2020-10-25 VITALS — BP 116/70 | HR 70 | Ht 70.0 in | Wt 185.0 lb

## 2020-10-25 DIAGNOSIS — R269 Unspecified abnormalities of gait and mobility: Secondary | ICD-10-CM

## 2020-10-25 DIAGNOSIS — F02818 Dementia in other diseases classified elsewhere, unspecified severity, with other behavioral disturbance: Secondary | ICD-10-CM

## 2020-10-25 DIAGNOSIS — G301 Alzheimer's disease with late onset: Secondary | ICD-10-CM

## 2020-10-25 DIAGNOSIS — F0281 Dementia in other diseases classified elsewhere with behavioral disturbance: Secondary | ICD-10-CM

## 2020-10-25 MED ORDER — MEMANTINE HCL 10 MG PO TABS: 10.0000 mg | ORAL_TABLET | Freq: Two times a day (BID) | ORAL | 3 refills | Status: DC

## 2020-10-25 NOTE — Progress Notes (Signed)
PATIENT: Devin Vance DOB: 05-16-1928  REASON FOR VISIT: follow up HISTORY FROM: patient  HISTORY OF PRESENT ILLNESS: Today 10/25/20 Mr. Devin Vance is a 87 year old male with history of progressive dementing illness consistent with Alzheimer's disease.  He is on Depakote and Seroquel for episodes of agitation.  He had Parkinson features with Risperdal, walking improved some with Risperdal discontinuation.  Remains overall stable, continues to have some decline in memory, has trouble remembering his children's names.  His mood is overall good, no episodes of agitation or hallucinations.  He is walking with a walker, does well with this.  They reside in a senior living apartment, he will go down to the meal hall and bring back their meals.  He has trouble sleeping, partly due to incontinence issues, does have daytime drowsiness.  He has periods of constipation followed by diarrhea.  His wife thinks he could benefit more from trazodone, which she was previously on.  Does a few days a week of chair boxing class.  Participates in [place] dominoes with other residents that requires math skills.  Presents today for evaluation accompanied by his wife Devin Vance.  HISTORY  04/21/2020 [name]: Mr. Honeywell is a 87 year old right-handed white male with a history of a progressive dementing illness consistent with Alzheimer's disease.  The patient has had episodes of agitation in the past, he is currently on Depakote 500 mg at night and Seroquel 25 mg at night.  The wife indicates that he sleeps well at night but also sleeps all day long.  He is minimally active, he has not had any hallucinations or agitation.  He walks with a walker for longer distances.  He did have some parkinsonian features with Risperdal, his walking has improved some stopping the medication but he still take short shuffling steps.  He recently was reduced on his metoprolol dose due to some reduction in blood pressures.  He returns for an  evaluation.  REVIEW OF SYSTEMS: Out of a complete 14 system review of symptoms, the patient complains only of the following symptoms, and all other reviewed systems are negative.  Memory loss, walking difficulty.  ALLERGIES: Allergies  Allergen Reactions  . Lorazepam Other (See Comments)    Generalized weakness    HOME MEDICATIONS: Outpatient Medications Prior to Visit  Medication Sig Dispense Refill  . aspirin EC 81 MG tablet Take by mouth.    Marland Kitchen CALCIUM CITRATE PO Take 300 mg by mouth.    . Cholecalciferol (VITAMIN D-3 PO) Take 1,000 units of lipase by mouth.    . hydroxychloroquine (PLAQUENIL) 200 MG tablet Take 200 mg by mouth daily. One pill 200mg  every other day , other days two pills    . ketoconazole (NIZORAL) 2 % shampoo     . Lactobacillus (PROBIOTIC ACIDOPHILUS PO) Take 1 tablet by mouth daily.    . Lutein 20 MG CAPS Take 40 mg by mouth daily.     . Multiple Vitamin (MULTIVITAMIN) capsule Take by mouth.    . Omega-3 Fatty Acids (FISH OIL) 1200 MG CAPS Take by mouth.    . QUEtiapine (SEROQUEL) 25 MG tablet Take 1/2 - 1 tablet each night (Patient taking differently: Take 1 tablet each night) 30 tablet 5  . divalproex (DEPAKOTE) 250 MG DR tablet 1 tablet at night for 4 weeks, then stop 30 tablet 0  . memantine (NAMENDA) 10 MG tablet Take 1 tablet (10 mg total) by mouth 2 (two) times daily. 180 tablet 3  . metoprolol succinate (TOPROL-XL)  25 MG 24 hr tablet Take 12.5 mg by mouth.     . rosuvastatin (CRESTOR) 10 MG tablet Take 10 mg by mouth at bedtime.     No facility-administered medications prior to visit.    PAST MEDICAL HISTORY: Past Medical History:  Diagnosis Date  . Alzheimer disease ([place]) 07/31/2018  . Angina pectoris ([place])   . Arthritis   . Cardiomyopathy ([place])   . COPD (chronic obstructive pulmonary disease) ([place])   . Gastroesophageal reflux disease   . History of colon polyps   . Hyperlipidemia   . Hypertension   . Obesity   . Osteoporosis   .  Polymyalgia rheumatica ([place])   . Prostate cancer ([place])   . Rheumatoid arthritis ([place])   . Secondary Parkinson disease ([place]) 08/13/2019  . Sleep apnea     PAST SURGICAL HISTORY: Past Surgical History:  Procedure Laterality Date  . CATARACT EXTRACTION    . CORONARY ANGIOPLASTY    . HEMORRHOIDECTOMY WITH HEMORRHOID BANDING    . PROSTATECTOMY    . STRABISMUS SURGERY    . TONSILLECTOMY      FAMILY HISTORY: Family History  Problem Relation Age of Onset  . Stroke Mother   . Dementia Mother   . Cancer Father   . Dementia Sister   . Dementia Maternal Aunt     SOCIAL HISTORY: Social History   Socioeconomic History  . Marital status: Married    Spouse name: Not on file  . Number of children: Not on file  . Years of education: Not on file  . Highest education level: Not on file  Occupational History  . Not on file  Tobacco Use  . Smoking status: Never Smoker  . Smokeless tobacco: Never Used  Substance and Sexual Activity  . Alcohol use: Not Currently  . Drug use: Not Currently  . Sexual activity: Not on file  Other Topics Concern  . Not on file  Social History Narrative  . Not on file   Social Determinants of Health   Financial Resource Strain: Not on file  Food Insecurity: Not on file  Transportation Needs: Not on file  Physical Activity: Not on file  Stress: Not on file  Social Connections: Not on file  Intimate Partner Violence: Not on file   PHYSICAL EXAM  Vitals:   10/25/20 1437  BP: 116/70  Pulse: 70  Weight: 185 lb (83.9 kg)  Height: 5\' 10"  (1.778 m)   Body mass index is 26.54 kg/m.  Generalized: Well developed, in no acute distress   Neurological examination  Mentation: Alert, cooperative, most history is provided by his wife. Follows all commands speech and language fluent.  Masking of the face is seen, voice is low. Cranial nerve II-XII: Pupils were equal round reactive to light. Extraocular movements were full, visual field were full on  confrontational test. Facial sensation and strength were normal.  Head turning and shoulder shrug  were normal and symmetric. Motor: Good strength all extremities.  No tremor noted. Sensory: Sensory testing is intact to soft touch on all 4 extremities. No evidence of extinction is noted.  Coordination: Cerebellar testing reveals good finger-nose-finger and heel-to-shin bilaterally.  Gait and station: Has to push off from seated position to stand, has a stooped posture, decreased arm swing bilaterally, no tremor, able to walk to check out independently, short shuffling steps.  Did not bring walker today. Reflexes: Deep tendon reflexes are symmetric   DIAGNOSTIC DATA (LABS, IMAGING, TESTING) - I reviewed patient records, labs,  notes, testing and imaging myself where available.  Lab Results  Component Value Date   WBC 8.3 07/15/2019   HGB 12.1 (L) 07/15/2019   HCT 35.5 (L) 07/15/2019   MCV 94 07/15/2019   PLT 192 07/15/2019      Component Value Date/Time   NA 144 07/15/2019 1407   NA 145 04/09/2014 1759   K 4.4 07/15/2019 1407   K 4.2 04/09/2014 1759   CL 106 07/15/2019 1407   CL 112 (H) 04/09/2014 1759   CO2 25 07/15/2019 1407   CO2 27 04/09/2014 1759   GLUCOSE 75 07/15/2019 1407   GLUCOSE 134 (H) 10/24/2018 2018   GLUCOSE 117 (H) 04/09/2014 1759   BUN 35 (H) 07/15/2019 1407   BUN 30 (H) 04/09/2014 1759   CREATININE 1.49 (H) 07/15/2019 1407   CREATININE 1.61 (H) 04/09/2014 1759   CALCIUM 9.3 07/15/2019 1407   CALCIUM 8.6 04/09/2014 1759   PROT 5.9 (L) 07/15/2019 1407   PROT 6.8 04/09/2014 1759   ALBUMIN 4.0 07/15/2019 1407   ALBUMIN 3.5 04/09/2014 1759   AST 28 07/15/2019 1407   AST 34 04/09/2014 1759   ALT 37 07/15/2019 1407   ALT 35 04/09/2014 1759   ALKPHOS 60 07/15/2019 1407   ALKPHOS 62 04/09/2014 1759   BILITOT 0.3 07/15/2019 1407   BILITOT 0.3 04/09/2014 1759   GFRNONAA 41 (L) 07/15/2019 1407   GFRNONAA 39 (L) 04/09/2014 1759   GFRAA 47 (L) 07/15/2019 1407    GFRAA 45 (L) 04/09/2014 1759   Lab Results  Component Value Date   CHOL 114 03/22/2014   HDL 38 (L) 03/22/2014   LDLCALC 61 03/22/2014   TRIG 76 03/22/2014   No results found for: HGBA1C No results found for: VITAMINB12 Lab Results  Component Value Date   TSH 4.69 (H) 03/22/2014    ASSESSMENT AND PLAN 87 y.o. year old male  has a past medical history of Alzheimer disease ([place]) (07/31/2018), Angina pectoris ([place]), Arthritis, Cardiomyopathy ([place]), COPD (chronic obstructive pulmonary disease) ([place]), Gastroesophageal reflux disease, History of colon polyps, Hyperlipidemia, Hypertension, Obesity, Osteoporosis, Polymyalgia rheumatica ([place]), Prostate cancer ([place]), Rheumatoid arthritis ([place]), Secondary Parkinson disease ([place]) (08/13/2019), and Sleep apnea. here with:  1.  Alzheimer's disease 2.  Gait disorder -Some parkinsonian features noted on exam, will refrain from starting medications for Parkinson's at this time -Discontinue Depakote, after 1-2 weeks try cutting Seroquel in half 12.5 mg at bedtime, see if any benefit to daytime drowsiness, also GI symptoms -Depending on how he does off the medications, we may add back in trazodone, per his wife's request, he has apparently done well on this medication -Continue Namenda 10 mg twice a day -Follow-up in 6 months or sooner if needed  I spent 30 minutes of face-to-face and non-face-to-face time with patient.  This included previsit chart review, lab review, study review, order entry, electronic health record documentation, patient education.  Butler Denmark, AGNP-C, DNP 10/25/2020, 3:20 PM [facility] [address] [place], [place] [address] [phone]

## 2020-10-25 NOTE — Patient Instructions (Signed)
Continue Namenda Stop the Depakote  In a few weeks, try cutting back the Seroquel to 1/2 tablet, if he does well, stop it  See how he does off the medication for a few weeks, if sleeping is issue call for medication adjustment See you back in 6 months

## 2020-10-27 DIAGNOSIS — Z20828 Contact with and (suspected) exposure to other viral communicable diseases: Secondary | ICD-10-CM | POA: Diagnosis not present

## 2020-10-28 NOTE — Progress Notes (Signed)
I have read the note, and I agree with the clinical assessment and plan.  Nahun Kronberg K Chun Sellen   

## 2020-11-03 DIAGNOSIS — Z20828 Contact with and (suspected) exposure to other viral communicable diseases: Secondary | ICD-10-CM | POA: Diagnosis not present

## 2020-11-07 DIAGNOSIS — I10 Essential (primary) hypertension: Secondary | ICD-10-CM | POA: Diagnosis not present

## 2020-11-07 DIAGNOSIS — R269 Unspecified abnormalities of gait and mobility: Secondary | ICD-10-CM | POA: Diagnosis not present

## 2020-11-07 DIAGNOSIS — I2510 Atherosclerotic heart disease of native coronary artery without angina pectoris: Secondary | ICD-10-CM | POA: Diagnosis not present

## 2020-11-07 DIAGNOSIS — J449 Chronic obstructive pulmonary disease, unspecified: Secondary | ICD-10-CM | POA: Diagnosis not present

## 2020-11-07 DIAGNOSIS — G309 Alzheimer's disease, unspecified: Secondary | ICD-10-CM | POA: Diagnosis not present

## 2020-11-07 DIAGNOSIS — I011 Acute rheumatic endocarditis: Secondary | ICD-10-CM | POA: Diagnosis not present

## 2020-11-07 DIAGNOSIS — N183 Chronic kidney disease, stage 3 unspecified: Secondary | ICD-10-CM | POA: Diagnosis not present

## 2020-11-07 DIAGNOSIS — R7303 Prediabetes: Secondary | ICD-10-CM | POA: Diagnosis not present

## 2020-11-07 DIAGNOSIS — K5900 Constipation, unspecified: Secondary | ICD-10-CM | POA: Diagnosis not present

## 2020-11-07 DIAGNOSIS — I779 Disorder of arteries and arterioles, unspecified: Secondary | ICD-10-CM | POA: Diagnosis not present

## 2020-11-07 DIAGNOSIS — E039 Hypothyroidism, unspecified: Secondary | ICD-10-CM | POA: Diagnosis not present

## 2020-11-07 DIAGNOSIS — F39 Unspecified mood [affective] disorder: Secondary | ICD-10-CM | POA: Diagnosis not present

## 2020-11-14 DIAGNOSIS — Z20828 Contact with and (suspected) exposure to other viral communicable diseases: Secondary | ICD-10-CM | POA: Diagnosis not present

## 2020-11-17 DIAGNOSIS — Z20828 Contact with and (suspected) exposure to other viral communicable diseases: Secondary | ICD-10-CM | POA: Diagnosis not present

## 2020-11-21 DIAGNOSIS — Z20828 Contact with and (suspected) exposure to other viral communicable diseases: Secondary | ICD-10-CM | POA: Diagnosis not present

## 2020-11-24 DIAGNOSIS — Z20828 Contact with and (suspected) exposure to other viral communicable diseases: Secondary | ICD-10-CM | POA: Diagnosis not present

## 2020-11-28 DIAGNOSIS — Z20828 Contact with and (suspected) exposure to other viral communicable diseases: Secondary | ICD-10-CM | POA: Diagnosis not present

## 2020-12-01 DIAGNOSIS — Z20828 Contact with and (suspected) exposure to other viral communicable diseases: Secondary | ICD-10-CM | POA: Diagnosis not present

## 2020-12-05 DIAGNOSIS — Z20828 Contact with and (suspected) exposure to other viral communicable diseases: Secondary | ICD-10-CM | POA: Diagnosis not present

## 2020-12-08 DIAGNOSIS — N4883 Acquired buried penis: Secondary | ICD-10-CM | POA: Diagnosis not present

## 2020-12-12 DIAGNOSIS — Z20828 Contact with and (suspected) exposure to other viral communicable diseases: Secondary | ICD-10-CM | POA: Diagnosis not present

## 2020-12-15 DIAGNOSIS — Z20828 Contact with and (suspected) exposure to other viral communicable diseases: Secondary | ICD-10-CM | POA: Diagnosis not present

## 2020-12-19 DIAGNOSIS — Z20828 Contact with and (suspected) exposure to other viral communicable diseases: Secondary | ICD-10-CM | POA: Diagnosis not present

## 2021-01-02 DIAGNOSIS — Z20828 Contact with and (suspected) exposure to other viral communicable diseases: Secondary | ICD-10-CM | POA: Diagnosis not present

## 2021-01-04 DIAGNOSIS — I10 Essential (primary) hypertension: Secondary | ICD-10-CM | POA: Diagnosis not present

## 2021-01-04 DIAGNOSIS — K219 Gastro-esophageal reflux disease without esophagitis: Secondary | ICD-10-CM | POA: Diagnosis not present

## 2021-01-04 DIAGNOSIS — I429 Cardiomyopathy, unspecified: Secondary | ICD-10-CM | POA: Diagnosis not present

## 2021-01-04 DIAGNOSIS — M353 Polymyalgia rheumatica: Secondary | ICD-10-CM | POA: Diagnosis not present

## 2021-01-04 DIAGNOSIS — G4733 Obstructive sleep apnea (adult) (pediatric): Secondary | ICD-10-CM | POA: Diagnosis not present

## 2021-01-04 DIAGNOSIS — E7800 Pure hypercholesterolemia, unspecified: Secondary | ICD-10-CM | POA: Diagnosis not present

## 2021-01-04 DIAGNOSIS — I209 Angina pectoris, unspecified: Secondary | ICD-10-CM | POA: Diagnosis not present

## 2021-01-04 DIAGNOSIS — I779 Disorder of arteries and arterioles, unspecified: Secondary | ICD-10-CM | POA: Diagnosis not present

## 2021-01-04 DIAGNOSIS — I25810 Atherosclerosis of coronary artery bypass graft(s) without angina pectoris: Secondary | ICD-10-CM | POA: Diagnosis not present

## 2021-01-04 DIAGNOSIS — J42 Unspecified chronic bronchitis: Secondary | ICD-10-CM | POA: Diagnosis not present

## 2021-01-09 ENCOUNTER — Telehealth: Payer: Self-pay | Admitting: Neurology

## 2021-01-09 DIAGNOSIS — Z20828 Contact with and (suspected) exposure to other viral communicable diseases: Secondary | ICD-10-CM | POA: Diagnosis not present

## 2021-01-09 MED ORDER — DIVALPROEX SODIUM 250 MG PO DR TAB: 250.0000 mg | DELAYED_RELEASE_TABLET | Freq: Every day | ORAL | 0 refills | Status: DC

## 2021-01-09 NOTE — Telephone Encounter (Addendum)
I spoke to wife.  Relayed 2 options. Take seroquel 1.5 tabs po qhs, she said they are so samll, not scored.  Will try the depakote 250mg  po qhs and see if this will help.  She will let us know in 2 wks.

## 2021-01-09 NOTE — Telephone Encounter (Signed)
I called wife. And pt seems to be declining (not doing things that he did previously, has not truly accepted or acknowledged his illness, not trusting judgement of others (wife and care giver), not playing dominoes like he was, decrease in exercise, obstinate with caregiver (3 x she had to leave early, wanting to have wife with him in dining hall. Taking seroquel 1 tablet po qhs, memantine 10mg  po bid.  Asking about if other medication depakote may help his behavior.  Please advise.

## 2021-01-09 NOTE — Telephone Encounter (Signed)
Ok to send for 1 month Depakote, have her give [place] progress report in 1 month if helping.

## 2021-01-09 NOTE — Addendum Note (Signed)
Addended by: Brandon Melnick on: 01/09/2021 04:53 PM   Modules accepted: Orders

## 2021-01-09 NOTE — Telephone Encounter (Addendum)
At last visit we discontinued the Depakote, if his wife believes this is the cause of his mood issues, we can resume if needed. Sounds as tho the behavior of concern are some paranoia, not trusting others. I have on record he was taking Depakote 250 DR mg at bedtime at last time, this was reduced from 500 mg. Other option is trying 1.5 tablets of Seroquel if that has had positive impact on his behavior.

## 2021-01-09 NOTE — Telephone Encounter (Signed)
Pt's wife, Deryck Hippler (on [place]) called, need to adjust his medications. He is refusing help from the [place] with [facility], exercise, bathing etc.  If he does not do those changes he will lose services. Would like a call from the nurse.

## 2021-01-09 NOTE — Addendum Note (Signed)
Addended by: Suzzanne Cloud on: 01/09/2021 04:59 PM   Modules accepted: Orders

## 2021-01-12 DIAGNOSIS — L304 Erythema intertrigo: Secondary | ICD-10-CM | POA: Diagnosis not present

## 2021-01-12 DIAGNOSIS — L309 Dermatitis, unspecified: Secondary | ICD-10-CM | POA: Diagnosis not present

## 2021-01-16 DIAGNOSIS — Z20828 Contact with and (suspected) exposure to other viral communicable diseases: Secondary | ICD-10-CM | POA: Diagnosis not present

## 2021-01-23 DIAGNOSIS — Z20828 Contact with and (suspected) exposure to other viral communicable diseases: Secondary | ICD-10-CM | POA: Diagnosis not present

## 2021-01-26 DIAGNOSIS — F0281 Dementia in other diseases classified elsewhere with behavioral disturbance: Secondary | ICD-10-CM | POA: Diagnosis not present

## 2021-02-03 ENCOUNTER — Encounter (HOSPITAL_COMMUNITY): Payer: Self-pay | Admitting: Emergency Medicine

## 2021-02-03 ENCOUNTER — Ambulatory Visit (HOSPITAL_COMMUNITY)
Admission: EM | Admit: 2021-02-03 | Discharge: 2021-02-03 | Disposition: A | Discharge status: Home / Self Care | Payer: Medicare Other | Attending: Student | Admitting: Student

## 2021-02-03 DIAGNOSIS — L89322 Pressure ulcer of left buttock, stage 2: Secondary | ICD-10-CM | POA: Diagnosis not present

## 2021-02-03 DIAGNOSIS — L03317 Cellulitis of buttock: Secondary | ICD-10-CM

## 2021-02-03 DIAGNOSIS — N1831 Chronic kidney disease, stage 3a: Secondary | ICD-10-CM

## 2021-02-03 DIAGNOSIS — F0280 Dementia in other diseases classified elsewhere without behavioral disturbance: Secondary | ICD-10-CM

## 2021-02-03 MED ORDER — DOXYCYCLINE HYCLATE 100 MG PO CAPS: 100.0000 mg | ORAL_CAPSULE | Freq: Two times a day (BID) | ORAL | 0 refills | Status: AC

## 2021-02-03 NOTE — Discharge Instructions (Addendum)
-  Continue applying dressings and vaseline at home -Wash 1-2x daily with gentle soap and water -Start the antibiotic, doxycycline twice daily for 7 days.  Make sure to wear sunscreen while spending long periods of time outside while on this medication. -Follow-up with dermatology if symptoms persist.  Seek additional medical attention as soon as possible if you develop new symptoms like fevers, discharge from the wounds, dizziness, chest pain, shortness of breath.

## 2021-02-03 NOTE — ED Triage Notes (Signed)
Pt is accompanied by wife. He presents today with c/o of pressure sores to sacrum that have "opened up since yesterday". Wife also reports he is seen by Dermatologist for same, but was unable to get in to see him today. Wife is concerned that infection "may set in" over the weekend. Denies fever.

## 2021-02-03 NOTE — ED Provider Notes (Signed)
[facility]    CSN: [id number] Arrival date & time: 02/03/21  8088      History   Chief Complaint No chief complaint on file.   HPI Devin Vance is a 87 y.o. male presenting with sores on his buttocks.  Medical history Alzheimer's disease and so his wife provides history today. also with history of cardiomyopathy, COPD, GERD, hyperlipidemia, hypertension, obesity, prostate cancer, rheumatoid arthritis, Parkinson disease, renal failure.  Wife notes long history of pressure sores on his buttocks.  He is followed by dermatology for this.  They are currently using hydrocortisone iodoquinol cream as prescribed by dermatology.  Presenting today because wife is concerned that the sores are getting infected and she does not want this to get worse over the weekend.  Dermatology was unable to see them today.  States she has been applying Vaseline followed by a dressing as instructed by dermatology.  Denies discharge from the area, fever/chills, dizziness, chest pain, shortness of breath, changes in behavior.  HPI  Past Medical History:  Diagnosis Date  . Alzheimer disease ([place]) 07/31/2018  . Angina pectoris ([place])   . Arthritis   . Cardiomyopathy ([place])   . COPD (chronic obstructive pulmonary disease) ([place])   . Gastroesophageal reflux disease   . History of colon polyps   . Hyperlipidemia   . Hypertension   . Obesity   . Osteoporosis   . Polymyalgia rheumatica ([place])   . Prostate cancer ([place])   . Rheumatoid arthritis ([place])   . Secondary Parkinson disease ([place]) 08/13/2019  . Sleep apnea     Patient Active Problem List   Diagnosis Date Noted  . Gait abnormality 07/15/2019  . Cardiomyopathy ([place]) 03/24/2019  . COPD (chronic obstructive pulmonary disease) ([place]) 03/24/2019  . HLD (hyperlipidemia) 03/24/2019  . Acute renal failure superimposed on stage 3 chronic kidney disease ([place]) 10/28/2018  . CKD (chronic kidney disease) stage 3, GFR 30-59 ml/min (HCC) 10/28/2018  .  Rheumatoid arthritis involving multiple sites ([place]) 10/28/2018  . Dementia with behavioral disturbance ([place]) 10/27/2018  . Alzheimer's dementia with behavioral disturbance ([place]) 07/31/2018  . HTN (hypertension) 08/15/2017  . Anemia, mild 02/12/2017  . Olecranon bursitis of right elbow 02/12/2017  . Kyphosis of cervical region 10/26/2015  . Intermittent alternating esotropia 05/18/2015  . GERD (gastroesophageal reflux disease) 01/22/2014  . Angina pectoris ([place]) 01/22/2014    Past Surgical History:  Procedure Laterality Date  . CATARACT EXTRACTION    . CORONARY ANGIOPLASTY    . HEMORRHOIDECTOMY WITH HEMORRHOID BANDING    . PROSTATECTOMY    . STRABISMUS SURGERY    . TONSILLECTOMY         Home Medications    Prior to Admission medications   Medication Sig Start Date End Date Taking? Authorizing Provider  doxycycline (VIBRAMYCIN) 100 MG capsule Take 1 capsule (100 mg total) by mouth 2 (two) times daily for 7 days. 02/03/21 02/10/21 Yes Hazel Sams, PA-C  aspirin EC 81 MG tablet Take by mouth.    [provider]  CALCIUM CITRATE PO Take 300 mg by mouth.    [provider]  Cholecalciferol (VITAMIN D-3 PO) Take 1,000 units of lipase by mouth.    [provider]  divalproex (DEPAKOTE) 250 MG DR tablet Take 1 tablet (250 mg total) by mouth at bedtime. 01/09/21   Suzzanne Cloud, NP  hydroxychloroquine (PLAQUENIL) 200 MG tablet Take 200 mg by mouth daily. One pill 200mg  every other day , other days two pills 06/11/19  [provider]  ketoconazole (NIZORAL) 2 % shampoo  02/13/19   [provider]  Lactobacillus (PROBIOTIC ACIDOPHILUS PO) Take 1 tablet by mouth daily.    [provider]  Lutein 20 MG CAPS Take 40 mg by mouth daily.     [provider]  memantine (NAMENDA) 10 MG tablet Take 1 tablet (10 mg total) by mouth 2 (two) times daily. 10/25/20   Suzzanne Cloud, NP  metoprolol succinate (TOPROL-XL) 25 MG 24 hr tablet Take  12.5 mg by mouth.  08/20/16 04/21/20  [provider]  Multiple Vitamin (MULTIVITAMIN) capsule Take by mouth.    [provider]  Omega-3 Fatty Acids (FISH OIL) 1200 MG CAPS Take by mouth.    [provider]  QUEtiapine (SEROQUEL) 25 MG tablet Take 1/2 - 1 tablet each night Patient taking differently: Take 1 tablet each night 06/07/20   Lomax, Amy, NP    Family History Family History  Problem Relation Age of Onset  . Stroke Mother   . Dementia Mother   . Cancer Father   . Dementia Sister   . Dementia Maternal Aunt     Social History Social History   Tobacco Use  . Smoking status: Never Smoker  . Smokeless tobacco: Never Used  Substance Use Topics  . Alcohol use: Not Currently  . Drug use: Not Currently     Allergies   Lorazepam   Review of Systems Review of Systems  Skin: Positive for wound.  All other systems reviewed and are negative.    Physical Exam Triage Vital Signs ED Triage Vitals  Enc Vitals Group     BP 02/03/21 1007 (!) 154/72     Pulse Rate 02/03/21 1007 (!) 53     Resp 02/03/21 1007 16     Temp 02/03/21 1007 98.2 F (36.8 C)     Temp Source 02/03/21 1007 Oral     SpO2 02/03/21 1007 97 %     Weight 02/03/21 1009 180 lb (81.6 kg)     Height --      Head Circumference --      Peak Flow --      Pain Score 02/03/21 1008 2     Pain Loc --      Pain Edu? --      Excl. in [place]? --    No data found.  Updated Vital Signs BP (!) 154/72 (BP Location: Right Arm)   Pulse (!) 53   Temp 98.2 F (36.8 C) (Oral)   Resp 16   Wt 180 lb (81.6 kg)   SpO2 97%   BMI 25.83 kg/m   Visual Acuity Right Eye Distance:   Left Eye Distance:   Bilateral Distance:    Right Eye Near:   Left Eye Near:    Bilateral Near:     Physical Exam Vitals reviewed.  Constitutional:      General: He is not in acute distress.    Appearance: Normal appearance. He is not ill-appearing or diaphoretic.  HENT:     Head: Normocephalic and atraumatic.   Cardiovascular:     Rate and Rhythm: Regular rhythm. Bradycardia present.     Heart sounds: Normal heart sounds.  Pulmonary:     Effort: Pulmonary effort is normal.     Breath sounds: Normal breath sounds.  Skin:    General: Skin is warm.          Comments: Bilateral inner buttocks with 2 x 3 cm area of  erythema.  Left buttock with small area of open sore.  Areas do have surrounding erythema and feel mildly warm to the touch.  No discharge.  Neurological:     General: No focal deficit present.     Mental Status: He is alert and oriented to person, place, and time.  Psychiatric:        Mood and Affect: Mood normal.        Behavior: Behavior normal.        Thought Content: Thought content normal.        Judgment: Judgment normal.      UC Treatments / Results  Labs (all labs ordered are listed, but only abnormal results are displayed) Labs Reviewed - No data to display  EKG   Radiology No results found.  Procedures Procedures (including critical care time)  Medications Ordered in UC Medications - No data to display  Initial Impression / Assessment and Plan / UC Course  I have reviewed the triage vital signs and the nursing notes.  Pertinent labs & imaging results that were available during my care of the patient were reviewed by me and considered in my medical decision making (see chart for details).     This patient is a 87 year old male presenting with pressure sores with overlying cellulitis.  Today he is afebrile and nontachycardic, oxygenating well on room air. He is mildly bradycardic but takes a beta blocker, denies chest pain, dizziness, shortness of breath.  Patient with CKD so we will treat with doxycycline as below.  Continue wound care as directed by dermatology, as well as the hydrocortisone iodoquinol cream.  Strict ED return precautions discussed.  Final Clinical Impressions(s) / UC Diagnoses   Final diagnoses:  Pressure injury of left buttock,  stage 2 (HCC)  Cellulitis of buttock  Stage 3a chronic kidney disease (HCC)  Alzheimer's dementia without behavioral disturbance, unspecified timing of dementia onset [facility])     Discharge Instructions     -Continue applying dressings and vaseline at home -Wash 1-2x daily with gentle soap and water -Start the antibiotic, doxycycline twice daily for 7 days.  Make sure to wear sunscreen while spending long periods of time outside while on this medication. -Follow-up with dermatology if symptoms persist.  Seek additional medical attention as soon as possible if you develop new symptoms like fevers, discharge from the wounds, dizziness, chest pain, shortness of breath.    ED Prescriptions    Medication Sig Dispense Auth. Provider   doxycycline (VIBRAMYCIN) 100 MG capsule Take 1 capsule (100 mg total) by mouth 2 (two) times daily for 7 days. 14 capsule Hazel Sams, PA-C     PDMP not reviewed this encounter.   Hazel Sams, PA-C 02/03/21 1105

## 2021-02-06 DIAGNOSIS — Z20828 Contact with and (suspected) exposure to other viral communicable diseases: Secondary | ICD-10-CM | POA: Diagnosis not present

## 2021-02-13 ENCOUNTER — Telehealth: Payer: Self-pay | Admitting: Neurology

## 2021-02-13 DIAGNOSIS — Z20828 Contact with and (suspected) exposure to other viral communicable diseases: Secondary | ICD-10-CM | POA: Diagnosis not present

## 2021-02-13 NOTE — Telephone Encounter (Signed)
At 1 times was on Depakote 500 mg, we had weaned off these medications. All of these medications can have risks and side effects. Possible to get through interval without adjustment? Could resume medications which were Depakote 500 mg at bedtime Seroquel 25 mg at bedtime. We weaned down because he was so drowsy back in July 2021. Need to be careful with these medications, had parkinsonian features with Risperdal.

## 2021-02-13 NOTE — Telephone Encounter (Signed)
Pt's wife, Jorgen Wolfinger (on [place]) called, need to discuss changes with my husband behavior. He is not cooperative with his aide. Would like advise about his behavior and what can be done. Would like a call from the nurse.

## 2021-02-13 NOTE — Telephone Encounter (Signed)
I called wife.   Since last spoken no change from the depkote 250mg  po qhs   Now urgent in relation to wife having eye surgery coming up 02-20-21 and post op 02-21-21 at [facility].  Not being cooperative for caregiver CNA who come 11 hours week.  Wife concerned about her absence and his behavior.  Taking seroquel 25mg  po qhs. memantine 10mg  po bid. Please advise.

## 2021-02-13 NOTE — Telephone Encounter (Signed)
Spoke to wife.  Relayed plan to increase depakote to 500mg  po qhs and keep seroquel at 25mg  po qhs.  Watch for drowsiness.  Continue activites as tolerated.  She appreciated call back.

## 2021-02-16 ENCOUNTER — Observation Stay (HOSPITAL_COMMUNITY)
Admission: EM | Admit: 2021-02-16 | Discharge: 2021-02-16 | Disposition: A | Discharge status: Home / Self Care | Payer: Medicare Other | Attending: Internal Medicine | Admitting: Internal Medicine

## 2021-02-16 ENCOUNTER — Emergency Department (HOSPITAL_COMMUNITY): Payer: Medicare Other

## 2021-02-16 ENCOUNTER — Encounter (HOSPITAL_COMMUNITY): Payer: Self-pay

## 2021-02-16 ENCOUNTER — Other Ambulatory Visit: Payer: Self-pay

## 2021-02-16 DIAGNOSIS — N183 Chronic kidney disease, stage 3 unspecified: Secondary | ICD-10-CM | POA: Diagnosis not present

## 2021-02-16 DIAGNOSIS — G20 Parkinson's disease: Secondary | ICD-10-CM | POA: Insufficient documentation

## 2021-02-16 DIAGNOSIS — G309 Alzheimer's disease, unspecified: Secondary | ICD-10-CM | POA: Insufficient documentation

## 2021-02-16 DIAGNOSIS — Z20822 Contact with and (suspected) exposure to covid-19: Secondary | ICD-10-CM | POA: Diagnosis not present

## 2021-02-16 DIAGNOSIS — Z79899 Other long term (current) drug therapy: Secondary | ICD-10-CM | POA: Diagnosis not present

## 2021-02-16 DIAGNOSIS — Z7982 Long term (current) use of aspirin: Secondary | ICD-10-CM | POA: Insufficient documentation

## 2021-02-16 DIAGNOSIS — Z8546 Personal history of malignant neoplasm of prostate: Secondary | ICD-10-CM | POA: Insufficient documentation

## 2021-02-16 DIAGNOSIS — R4182 Altered mental status, unspecified: Secondary | ICD-10-CM | POA: Diagnosis not present

## 2021-02-16 DIAGNOSIS — R4020 Unspecified coma: Secondary | ICD-10-CM | POA: Diagnosis not present

## 2021-02-16 DIAGNOSIS — R001 Bradycardia, unspecified: Secondary | ICD-10-CM | POA: Diagnosis not present

## 2021-02-16 DIAGNOSIS — I129 Hypertensive chronic kidney disease with stage 1 through stage 4 chronic kidney disease, or unspecified chronic kidney disease: Secondary | ICD-10-CM | POA: Diagnosis not present

## 2021-02-16 DIAGNOSIS — I959 Hypotension, unspecified: Secondary | ICD-10-CM | POA: Diagnosis not present

## 2021-02-16 DIAGNOSIS — J449 Chronic obstructive pulmonary disease, unspecified: Secondary | ICD-10-CM | POA: Diagnosis not present

## 2021-02-16 DIAGNOSIS — R404 Transient alteration of awareness: Secondary | ICD-10-CM | POA: Diagnosis not present

## 2021-02-16 LAB — RESP PANEL BY RT-PCR (FLU A&B, COVID) ARPGX2
Influenza A by PCR: NEGATIVE
Influenza B by PCR: NEGATIVE
SARS Coronavirus 2 by RT PCR: NEGATIVE

## 2021-02-16 LAB — COMPREHENSIVE METABOLIC PANEL
ALT: 14 U/L (ref 0–44)
AST: 17 U/L (ref 15–41)
Albumin: 2.9 g/dL — ABNORMAL LOW (ref 3.5–5.0)
Alkaline Phosphatase: 41 U/L (ref 38–126)
Anion gap: 5 (ref 5–15)
BUN: 24 mg/dL — ABNORMAL HIGH (ref 8–23)
CO2: 28 mmol/L (ref 22–32)
Calcium: 8.5 mg/dL — ABNORMAL LOW (ref 8.9–10.3)
Chloride: 108 mmol/L (ref 98–111)
Creatinine, Ser: 1.53 mg/dL — ABNORMAL HIGH (ref 0.61–1.24)
GFR, Estimated: 43 mL/min — ABNORMAL LOW (ref >60)
Glucose, Bld: 85 mg/dL (ref 70–99)
Potassium: 4.1 mmol/L (ref 3.5–5.1)
Sodium: 141 mmol/L (ref 135–145)
Total Bilirubin: 0.3 mg/dL (ref 0.3–1.2)
Total Protein: 5 g/dL — ABNORMAL LOW (ref 6.5–8.1)

## 2021-02-16 LAB — CBC
HCT: 34.3 % — ABNORMAL LOW (ref 39.0–52.0)
Hemoglobin: 10.7 g/dL — ABNORMAL LOW (ref 13.0–17.0)
MCH: 29.2 pg (ref 26.0–34.0)
MCHC: 31.2 g/dL (ref 30.0–36.0)
MCV: 93.5 fL (ref 80.0–100.0)
Platelets: 145 10*3/uL — ABNORMAL LOW (ref 150–400)
RBC: 3.67 MIL/uL — ABNORMAL LOW (ref 4.22–5.81)
RDW: 14.1 % (ref 11.5–15.5)
WBC: 5.6 10*3/uL (ref 4.0–10.5)
nRBC: 0 % (ref 0.0–0.2)

## 2021-02-16 LAB — URINALYSIS, ROUTINE W REFLEX MICROSCOPIC
Bacteria, UA: NONE SEEN
Bilirubin Urine: NEGATIVE
Glucose, UA: NEGATIVE mg/dL
Ketones, ur: NEGATIVE mg/dL
Leukocytes,Ua: NEGATIVE
Nitrite: NEGATIVE
Protein, ur: NEGATIVE mg/dL
Specific Gravity, Urine: 1.009 (ref 1.005–1.030)
pH: 6 (ref 5.0–8.0)

## 2021-02-16 LAB — VALPROIC ACID LEVEL: Valproic Acid Lvl: 47 ug/mL — ABNORMAL LOW (ref 50.0–100.0)

## 2021-02-16 MED ORDER — LIDOCAINE HCL URETHRAL/MUCOSAL 2 % EX GEL
1.0000 "application " | Freq: Once | CUTANEOUS | Status: AC
  Administered 2021-02-16: 1 via TOPICAL
  Filled 2021-02-16: qty 11

## 2021-02-16 MED ORDER — QUETIAPINE FUMARATE 25 MG PO TABS
ORAL_TABLET | ORAL | 5 refills | Status: DC
Start: 2021-02-16 — End: 2021-03-03

## 2021-02-16 MED ORDER — SODIUM CHLORIDE 0.9 % IV BOLUS
500.0000 mL | Freq: Once | INTRAVENOUS | Status: AC
  Administered 2021-02-16: 500 mL via INTRAVENOUS

## 2021-02-16 NOTE — ED Notes (Signed)
Help get patient undressed on the monitor did ekg patient is resting with call bell in reach

## 2021-02-16 NOTE — ED Provider Notes (Signed)
[facility] DEPART[facility] Provider Note   CSN: [id number] Arrival date & time: 02/16/21  1204     History Chief Complaint  Patient presents with  . Altered Mental Status    Devin Vance is a 87 y.o. male.  HPI Level 5 caveat secondary to altered mental status and history of Parkinson's 87 year old man history of dementia, behavioral disorder secondary to dementia, cardiomyopathy, COPD, GERD, hyperlipidemia, hypertension, presents today with reports of altered mental status.  His wife is the historian.  She reports that he was in his normal state until 3 AM today.  She says that he goes to exercise classes, eats and goes to the bathroom, while walking with a walker normally.  She got him up at 3 AM to go to the bathroom.  He had a snack at that time.  This morning between 9 and 10, when they are normally get up, he was less responsive.  She had a difficult time getting him to ambulate at all.  She brings him in secondary to altered mental status.  She states that he has had a change in his medication recently.  He has been on Depakote for behavior.  The had taken it off but his behavior issues resurface.  He was back on 250 mg which has now been increased to 500 mg for the past 3 days.  Reports no other in his status.  He has not had any falls.  She does not report that he is on blood thinners.    Past Medical History:  Diagnosis Date  . Alzheimer disease ([place]) 07/31/2018  . Angina pectoris ([place])   . Arthritis   . Cardiomyopathy ([place])   . COPD (chronic obstructive pulmonary disease) ([place])   . Gastroesophageal reflux disease   . History of colon polyps   . Hyperlipidemia   . Hypertension   . Obesity   . Osteoporosis   . Polymyalgia rheumatica ([place])   . Prostate cancer ([place])   . Rheumatoid arthritis ([place])   . Secondary Parkinson disease ([place]) 08/13/2019  . Sleep apnea     Patient Active Problem List   Diagnosis Date Noted  . Gait abnormality 07/15/2019   . Cardiomyopathy ([place]) 03/24/2019  . COPD (chronic obstructive pulmonary disease) ([place]) 03/24/2019  . HLD (hyperlipidemia) 03/24/2019  . Acute renal failure superimposed on stage 3 chronic kidney disease ([place]) 10/28/2018  . CKD (chronic kidney disease) stage 3, GFR 30-59 ml/min (HCC) 10/28/2018  . Rheumatoid arthritis involving multiple sites ([place]) 10/28/2018  . Dementia with behavioral disturbance ([place]) 10/27/2018  . Alzheimer's dementia with behavioral disturbance ([place]) 07/31/2018  . HTN (hypertension) 08/15/2017  . Anemia, mild 02/12/2017  . Olecranon bursitis of right elbow 02/12/2017  . Kyphosis of cervical region 10/26/2015  . Intermittent alternating esotropia 05/18/2015  . GERD (gastroesophageal reflux disease) 01/22/2014  . Angina pectoris ([place]) 01/22/2014    Past Surgical History:  Procedure Laterality Date  . CATARACT EXTRACTION    . CORONARY ANGIOPLASTY    . HEMORRHOIDECTOMY WITH HEMORRHOID BANDING    . PROSTATECTOMY    . STRABISMUS SURGERY    . TONSILLECTOMY         Family History  Problem Relation Age of Onset  . Stroke Mother   . Dementia Mother   . Cancer Father   . Dementia Sister   . Dementia Maternal Aunt     Social History   Tobacco Use  . Smoking status: Never Smoker  . Smokeless tobacco: Never Used  Substance Use Topics  . Alcohol use: Not Currently  . Drug use: Not Currently    Home Medications Prior to Admission medications   Medication Sig Start Date End Date Taking? Authorizing Provider  aspirin EC 81 MG tablet Take by mouth.    [provider]  CALCIUM CITRATE PO Take 300 mg by mouth.    [provider]  Cholecalciferol (VITAMIN D-3 PO) Take 1,000 units of lipase by mouth.    [provider]  divalproex (DEPAKOTE) 250 MG DR tablet Take 1 tablet (250 mg total) by mouth at bedtime. 01/09/21   Suzzanne Cloud, NP  hydroxychloroquine (PLAQUENIL) 200 MG tablet Take 200 mg by mouth daily. One pill 200mg  every other  day , other days two pills 06/11/19   [provider]  ketoconazole (NIZORAL) 2 % shampoo  02/13/19   [provider]  Lactobacillus (PROBIOTIC ACIDOPHILUS PO) Take 1 tablet by mouth daily.    [provider]  Lutein 20 MG CAPS Take 40 mg by mouth daily.     [provider]  memantine (NAMENDA) 10 MG tablet Take 1 tablet (10 mg total) by mouth 2 (two) times daily. 10/25/20   Suzzanne Cloud, NP  metoprolol succinate (TOPROL-XL) 25 MG 24 hr tablet Take 12.5 mg by mouth.  08/20/16 04/21/20  [provider]  Multiple Vitamin (MULTIVITAMIN) capsule Take by mouth.    [provider]  Omega-3 Fatty Acids (FISH OIL) 1200 MG CAPS Take by mouth.    [provider]  QUEtiapine (SEROQUEL) 25 MG tablet Take 1/2 - 1 tablet each night Patient taking differently: Take 1 tablet each night 06/07/20   Lomax, Amy, NP    Allergies    Lorazepam  Review of Systems   Review of Systems  Unable to perform ROS: Dementia    Physical Exam Updated Vital Signs Temp (!) 97.5 F (36.4 C) (Oral)   Ht 1.778 m (5\' 10" )   Wt 81 kg   BMI 25.62 kg/m   Physical Exam Vitals reviewed.  Constitutional:      General: He is not in acute distress.    Appearance: Normal appearance. He is ill-appearing.     Comments: Eyes closed but opens to verbal stimuli  HENT:     Head: Normocephalic.     Right Ear: External ear normal.     Left Ear: External ear normal.     Nose: Nose normal.     Mouth/Throat:     Mouth: Mucous membranes are dry.     Pharynx: Oropharynx is clear.  Eyes:     Pupils: Pupils are equal, round, and reactive to light.  Cardiovascular:     Rate and Rhythm: Normal rate and regular rhythm.     Pulses: Normal pulses.  Pulmonary:     Effort: Pulmonary effort is normal.     Breath sounds: Normal breath sounds.  Abdominal:     General: Bowel sounds are normal.     Palpations: Abdomen is soft.  Genitourinary:    Penis: Normal.      Comments: Some  perirectal erythema consistent with prior rash per wife Musculoskeletal:        General: Normal range of motion.     Cervical back: Normal range of motion.  Skin:    General: Skin is warm and dry.     Capillary Refill: Capillary refill takes less than 2 seconds.     Coloration: Skin is pale.  Neurological:  General: No focal deficit present.     Comments: Patient opens eyes and tracks Decreased active movement but squeezes fingers equally and withdraws feet equally     ED Results / Procedures / Treatments   Labs (all labs ordered are listed, but only abnormal results are displayed) Labs Reviewed - No data to display  EKG None  Radiology CT Head Wo Contrast  Result Date: 02/16/2021 CLINICAL DATA:  Altered mental status. EXAM: CT HEAD WITHOUT CONTRAST TECHNIQUE: Contiguous axial images were obtained from the base of the skull through the vertex without intravenous contrast. COMPARISON:  None. FINDINGS: Brain: No evidence of acute infarction, hemorrhage, hydrocephalus, extra-axial collection or mass lesion/mass effect. Atrophy and chronic microvascular ischemic change noted. Vascular: No hyperdense vessel or unexpected calcification. Skull: Intact.  No focal lesion. Sinuses/Orbits: Small left mastoid effusion is identified. Other: None. IMPRESSION: No acute abnormality. Atrophy and chronic microvascular ischemic change. Small left mastoid effusion. Electronically Signed   By: Inge Rise M.D.   On: 02/16/2021 13:42   DG Chest Port 1 View  Result Date: 02/16/2021 CLINICAL DATA:  Altered mental status. EXAM: PORTABLE CHEST 1 VIEW COMPARISON:  PA and lateral chest 10/27/2018. FINDINGS: Lungs clear. Heart size normal. Aortic atherosclerosis. No pneumothorax or pleural effusion. No acute or focal bony abnormality. IMPRESSION: No acute disease. Aortic Atherosclerosis (ICD10-I70.0). Electronically Signed   By: Inge Rise M.D.   On: 02/16/2021 13:16    Procedures Procedures    Medications Ordered in ED Medications - No data to display  ED Course  I have reviewed the triage vital signs and the nursing notes.  Pertinent labs & imaging results that were available during my care of the patient were reviewed by me and considered in my medical decision making (see chart for details).  Clinical Course as of 02/16/21 1312  Thu Feb 16, 2021  1309 Vital signs reviewed [DR]    Clinical Course User Index [DR] Pattricia Boss, MD   MDM Rules/Calculators/A&P                          87 yo male with parkinson's presents today with ams.  Patient with normal vs, afebrile, nonfocal exam.  Normally ambulatory with walker, today much increased generalized weakness.  Wife unable to get him to walk more than a few steps. Concern and recent increase in Depakote dose.  However, here Depakote is 47 and does not appear to be in the toxic range.  Although, patient could have some ataxia and generalized weakness even as nontoxic range.  Patient's hemoglobin is slightly low at 10.7>12>11.4.  No report of gi bleeding, not on blood thinners, with history of mild anemia.  Patient has some CKD and renal function appears stable.  Other labs appear to be within range for this patient.  Urinalysis with 11-20 rbc, 0-5 wbc- will culture Patient improved from initial evaluation with some increased verbal response. Will attempt po Discussed with Dr. Roosevelt Locks who will see and evaluate   Final Clinical Impression(s) / ED Diagnoses Final diagnoses:  Altered mental status, unspecified altered mental status type    Rx / DC Orders ED Discharge Orders    None       Pattricia Boss, MD 02/16/21 [phone]

## 2021-02-16 NOTE — ED Notes (Signed)
Patient transported to CT 

## 2021-02-16 NOTE — ED Notes (Signed)
Unable to get a cath urine at this time

## 2021-02-16 NOTE — ED Notes (Signed)
Family updated as to patient's status.

## 2021-02-16 NOTE — Consult Note (Signed)
Patient Demographics  Devin Vance, is a 87 y.o. male   MRN: [id number]   DOB - July 20, 1928  Admit Date - 02/16/2021    Outpatient Primary MD for the patient is Wenda Low, MD  Consult requested in the Hospital by [facility], Silver Huguenin, MD, On 02/16/2021    Reason for consult : to evaluate for admission   With History of -  Past Medical History:  Diagnosis Date  . Alzheimer disease ([place]) 07/31/2018  . Angina pectoris ([place])   . Arthritis   . Cardiomyopathy ([place])   . COPD (chronic obstructive pulmonary disease) ([place])   . Gastroesophageal reflux disease   . History of colon polyps   . Hyperlipidemia   . Hypertension   . Obesity   . Osteoporosis   . Polymyalgia rheumatica ([place])   . Prostate cancer ([place])   . Rheumatoid arthritis ([place])   . Secondary Parkinson disease ([place]) 08/13/2019  . Sleep apnea       Past Surgical History:  Procedure Laterality Date  . CATARACT EXTRACTION    . CORONARY ANGIOPLASTY    . HEMORRHOIDECTOMY WITH HEMORRHOID BANDING    . PROSTATECTOMY    . STRABISMUS SURGERY    . TONSILLECTOMY      in for   Chief Complaint  Patient presents with  . Altered Mental Status     HPI  Devin Vance  is a 87 y.o. male, with past medical history of Alzheimer's dementia, cardiomyopathy, COPD, GERD, hyperlipidemia, hypertension, patient has been followed by psychiatry/neurology regarding medication and treatment for his dementia, as well he is followed with the [facility] hospital, patient has been with progressive dementia over few years, requiring frequent medication adjustments, patient had 1 hospitalization in January 2020 due to dementia with behavioral disturbances and behavioral health unit , patient with recent change in behavior where his medication needed to be adjusted, where occasionally he has been refusing some help offered by staff, patient is chronically on Seroquel, he did  stop his Depakote couple month ago, but he resumed back on a lower dose 250 mg oral nightly over last couple month, dose was recently increased to 300 mg oral at bedtime for last 3 days, given he has been refusing some healthcare, wife report patient has been more lethargic than baseline, and more weaker, at baseline he ambulates with a walker, but she noticed this morning he is overall more weak, but nothing focal, and he has been more sleepy, she denies any falls, and it was difficult to get him to ambulate at all today, so she brought him to ED for further evaluation - in ED CT head with no acute findings, creatinine at baseline of 1.5, he had negative urine analysis, no acute finding on chest x-ray, no significant labs abnormalities, [facility] hospitalist were consulted to assess need for admission.    Review of Systems    Unable to obtain appropriate review of system given patient with  Alzheimer's dementia, cannot provide any reliable history.   Social History  Social History   Tobacco Use  . Smoking status: Never Smoker  . Smokeless tobacco: Never Used  Substance Use Topics  . Alcohol use: Not Currently     Family History Family History  Problem Relation Age of Onset  . Stroke Mother   . Dementia Mother   . Cancer Father   . Dementia Sister   . Dementia Maternal Aunt      Prior to Admission medications   Medication Sig Start Date End Date Taking? Authorizing Provider  aspirin EC 81 MG tablet Take by mouth.    [provider]  CALCIUM CITRATE PO Take 300 mg by mouth.    [provider]  Cholecalciferol (VITAMIN D-3 PO) Take 1,000 units of lipase by mouth.    [provider]  divalproex (DEPAKOTE) 250 MG DR tablet Take 1 tablet (250 mg total) by mouth at bedtime. 01/09/21   Suzzanne Cloud, NP  hydroxychloroquine (PLAQUENIL) 200 MG tablet Take 200 mg by mouth daily. One pill 200mg  every other day , other days two pills 06/11/19   [provider]   ketoconazole (NIZORAL) 2 % shampoo  02/13/19   [provider]  Lactobacillus (PROBIOTIC ACIDOPHILUS PO) Take 1 tablet by mouth daily.    [provider]  Lutein 20 MG CAPS Take 40 mg by mouth daily.     [provider]  memantine (NAMENDA) 10 MG tablet Take 1 tablet (10 mg total) by mouth 2 (two) times daily. 10/25/20   Suzzanne Cloud, NP  metoprolol succinate (TOPROL-XL) 25 MG 24 hr tablet Take 12.5 mg by mouth.  08/20/16 04/21/20  [provider]  Multiple Vitamin (MULTIVITAMIN) capsule Take by mouth.    [provider]  Omega-3 Fatty Acids (FISH OIL) 1200 MG CAPS Take by mouth.    [provider]  QUEtiapine (SEROQUEL) 25 MG tablet Take 1 tablet each night 02/16/21   Taunia Frasco, Silver Huguenin, MD    Anti-infectives (From admission, onward)   None      Scheduled Meds: Continuous Infusions: PRN Meds:.  Allergies  Allergen Reactions  . Lorazepam Other (See Comments)    Generalized weakness    Physical Exam  Vitals  Blood pressure (!) 144/95, pulse (!) 52, temperature (!) 97.5 F (36.4 C), temperature source Oral, resp. rate 15, height 5\' 10"  (1.778 m), weight 81 kg, SpO2 99 %.   1. General frail elderly male, laying in bed, no apparent distress  2.  Awake alert at time to, pleasant, communicative, follow commands, impaired judgment and insight, with dementia.   3. No F.N deficits, ALL C.Nerves Intact, Strength 5/5 all 4 extremities, Sensation intact all 4 extremities, Plantars down going.  Patient with no focal deficits, but overall he has generalized weakness.  4. Ears and Eyes appear Normal, Conjunctivae clear, PERRLA. Moist Oral Mucosa.  5. Supple Neck, No JVD, No cervical lymphadenopathy appriciated, No Carotid Bruits.  6. Symmetrical Chest wall movement, Good air movement bilaterally, CTAB.  7. RRR, No Gallops, Rubs or Murmurs, No Parasternal Heave.  8. Positive Bowel Sounds, Abdomen Soft, No tenderness, No organomegaly  appriciated,No rebound -guarding or rigidity.  9.  No Cyanosis, Normal Skin Turgor, No Skin Rash or Bruise.  10. Good muscle tone,  joints appear normal , no effusions, Normal ROM.  11. No Palpable Lymph Nodes in Neck or Axillae  Data Review  CBC Recent Labs  Lab 02/16/21 1334  WBC 5.6  HGB 10.7*  HCT 34.3*  PLT 145*  MCV  93.5  MCH 29.2  MCHC 31.2  RDW 14.1   ------------------------------------------------------------------------------------------------------------------  Chemistries  Recent Labs  Lab 02/16/21 1334  NA 141  K 4.1  CL 108  CO2 28  GLUCOSE 85  BUN 24*  CREATININE 1.53*  CALCIUM 8.5*  AST 17  ALT 14  ALKPHOS 41  BILITOT 0.3   ------------------------------------------------------------------------------------------------------------------ estimated creatinine clearance is 32.5 mL/min (A) (by C-G formula based on SCr of 1.53 mg/dL (H)). ------------------------------------------------------------------------------------------------------------------ No results for input(s): TSH, T4TOTAL, T3FREE, THYROIDAB in the last 72 hours.  Invalid input(s): FREET3   Coagulation profile No results for input(s): INR, PROTIME in the last 168 hours. ------------------------------------------------------------------------------------------------------------------- No results for input(s): DDIMER in the last 72 hours. -------------------------------------------------------------------------------------------------------------------  Cardiac Enzymes No results for input(s): CKMB, TROPONINI, MYOGLOBIN in the last 168 hours.  Invalid input(s): CK ------------------------------------------------------------------------------------------------------------------ Invalid input(s): POCBNP   ---------------------------------------------------------------------------------------------------------------  Urinalysis    Component Value Date/Time   COLORURINE YELLOW  02/16/2021 Walker 02/16/2021 1241   APPEARANCEUR Clear 07/16/2019 1206   LABSPEC 1.009 02/16/2021 1241   PHURINE 6.0 02/16/2021 1241   GLUCOSEU NEGATIVE 02/16/2021 1241   HGBUR SMALL (A) 02/16/2021 1241   BILIRUBINUR NEGATIVE 02/16/2021 1241   BILIRUBINUR Negative 07/16/2019 1206   KETONESUR NEGATIVE 02/16/2021 1241   PROTEINUR NEGATIVE 02/16/2021 1241   NITRITE NEGATIVE 02/16/2021 [place] 02/16/2021 1241     Imaging results:   CT Head Wo Contrast  Result Date: 02/16/2021 CLINICAL DATA:  Altered mental status. EXAM: CT HEAD WITHOUT CONTRAST TECHNIQUE: Contiguous axial images were obtained from the base of the skull through the vertex without intravenous contrast. COMPARISON:  None. FINDINGS: Brain: No evidence of acute infarction, hemorrhage, hydrocephalus, extra-axial collection or mass lesion/mass effect. Atrophy and chronic microvascular ischemic change noted. Vascular: No hyperdense vessel or unexpected calcification. Skull: Intact.  No focal lesion. Sinuses/Orbits: Small left mastoid effusion is identified. Other: None. IMPRESSION: No acute abnormality. Atrophy and chronic microvascular ischemic change. Small left mastoid effusion. Electronically Signed   By: Inge Rise M.D.   On: 02/16/2021 13:42   DG Chest Port 1 View  Result Date: 02/16/2021 CLINICAL DATA:  Altered mental status. EXAM: PORTABLE CHEST 1 VIEW COMPARISON:  PA and lateral chest 10/27/2018. FINDINGS: Lungs clear. Heart size normal. Aortic atherosclerosis. No pneumothorax or pleural effusion. No acute or focal bony abnormality. IMPRESSION: No acute disease. Aortic Atherosclerosis (ICD10-I70.0). Electronically Signed   By: Inge Rise M.D.   On: 02/16/2021 13:16      Assessment & Plan  Active Problems:   AMS (altered mental status)  Altered Mental status -Patient presents with increased lethargy, and generalized weakness, he has no focal deficits, work-up including  CT head with no acute findings, no evidence of infectious etiology, negative urinalysis, negative chest x-ray, as well as blood work at baseline, with no acute findings, there is no evidence of dehydration on physical exam. -Current presentation and clinical exam not concerning for any ischemic event or stroke. -Finding most likely in the setting of recent adjustment of his medications, most likely caused by his recent increase in Depakote from 250 mg to 500 mg at bedtime, he is on Seroquel at bedtime as well, this medication has been adjusted recently given his progressive Alzheimer's dementia. -I have discussed with the wife, offered her admission overnight for observation, but wife would prefer patient to be discharged back to home, as she was worried he will be get more confused and restless overnight, she reports he is  always better in his house, which I do think it is very appropriate, as certainly patient is very high risk for hospital delirium and increased confusion and risk of falls in the hospital, so I think is very appropriate for him to be discharged his home from ED. -I have discussed with her to continue with his Seroquel at current dose this evening(25 mg QHS), and to hold Depakote this evening, and to resume tomorrow at 250 mg oral nightly  Instead of 500 mg nightly.,  And she will contact his neurologist tomorrow regarding further recommendations.   AM Labs Ordered, also please review Full Orders  Family Communication: Plan discussed with patient and wife at bedside   Thank you for the consult, we will follow the patient with you in the Hospital.   Phillips Climes M.D on 02/16/2021 at 4:54 PM  Between 7am to 7pm - Pager - [phone]  After 7pm go to [email or web address]    Thank you for the consult, we will follow the patient with you in the [facility]   Office  [phone]

## 2021-02-16 NOTE — ED Triage Notes (Signed)
3am was able to get up OOB and talk at baseline woke up around 930 for exercises was brought back to the room because he wasn't participating wife states has not had meds today and not had anything to eat or drink and was not acting himself much more quiet and not responding to cues currently responding to painful stimuli and currently follow commands

## 2021-02-16 NOTE — Discharge Instructions (Signed)
Start your Depakote back tomorrow at 250 mg, not the 500 mg dose.  Contact your neurologist for further advice regarding this.  Return to the emergency room if you have any worsening symptoms.

## 2021-02-20 DIAGNOSIS — Z20828 Contact with and (suspected) exposure to other viral communicable diseases: Secondary | ICD-10-CM | POA: Diagnosis not present

## 2021-02-22 NOTE — Telephone Encounter (Signed)
Pt's wife, Culley Hedeen (on [place]) increase Depakote seem to be too much for him. He became nostalgic and could not wake him up. He took ambulance [place] on 5/5. All test came out negative except for the Depakote, but was not a toxic dose. He needs a sooner appt than June. Would like a call from the nurse

## 2021-02-22 NOTE — Telephone Encounter (Signed)
Called called wife.  Patient went to ED 02/16/2021 for somnolence.  We had an increased dose of Depakote to 500 mg a day for behavior.  As from previous he had similar reaction.  He is now taking to 250mg  daily again.  He is doing well per wife right now.  Recommended to come in sooner from the ED.  Made appointment Monday5-16-22 1245.  Wife was appreciative.

## 2021-02-27 ENCOUNTER — Ambulatory Visit (INDEPENDENT_AMBULATORY_CARE_PROVIDER_SITE_OTHER): Payer: Medicare Other | Admitting: Neurology

## 2021-02-27 ENCOUNTER — Encounter: Payer: Self-pay | Admitting: Neurology

## 2021-02-27 ENCOUNTER — Other Ambulatory Visit: Payer: Self-pay

## 2021-02-27 VITALS — BP 127/77 | HR 52 | Ht 70.0 in | Wt 178.0 lb

## 2021-02-27 DIAGNOSIS — R269 Unspecified abnormalities of gait and mobility: Secondary | ICD-10-CM | POA: Diagnosis not present

## 2021-02-27 DIAGNOSIS — G301 Alzheimer's disease with late onset: Secondary | ICD-10-CM | POA: Diagnosis not present

## 2021-02-27 DIAGNOSIS — F02818 Dementia in other diseases classified elsewhere, unspecified severity, with other behavioral disturbance: Secondary | ICD-10-CM

## 2021-02-27 DIAGNOSIS — F0281 Dementia in other diseases classified elsewhere with behavioral disturbance: Secondary | ICD-10-CM | POA: Diagnosis not present

## 2021-02-27 DIAGNOSIS — Z20828 Contact with and (suspected) exposure to other viral communicable diseases: Secondary | ICD-10-CM | POA: Diagnosis not present

## 2021-02-27 NOTE — Patient Instructions (Signed)
Stay off Depakote  Continue Seroquel, Namenda  Call for mood issues See you back in 6 months

## 2021-02-27 NOTE — Progress Notes (Signed)
PATIENT: Devin Vance DOB: 1928-01-15  REASON FOR VISIT: follow up HISTORY FROM: patient  HISTORY OF PRESENT ILLNESS: Today 02/27/21 Devin Vance is a 87 year old male with history of progressive dementing illness consistent with Alzheimer's disease.  He has had parkinsonian features with Risperdal, thus discontinued.  He is on Namenda.  His wife has called reporting more agitation with his aide, Depakote was increased 500 mg at bedtime, took to ER for AMS, Depakote level was 47. Has remained off since, mood has been doing well.  Is not sleeping as much during the day.  Remains on Seroquel 25 mg at bedtime.  Sleeps fairly well, urinary frequency does wake him up.  No falls, his balance has been good.  He uses a walker, didn't bring it today, too heavy for his wife to lift. Wife gets frustrated when he isn't active enough or sits down to wait for elevator instead of standing. Loves doing chair boxing. The aide has been back and all is well. His wife had eye surgery last week, doing well.  Here today for evaluation accompanied by his wife, and daughter [name].  In ER, negative for UTI, CBC showed HGB 10.7, platelets 145, creatinine 1.53.   Update 10/25/2020 SS: Devin Vance is a 87 year old male with history of progressive dementing illness consistent with Alzheimer's disease.  He is on Depakote and Seroquel for episodes of agitation.  He had Parkinson features with Risperdal, walking improved some with Risperdal discontinuation.  Remains overall stable, continues to have some decline in memory, has trouble remembering his children's names.  His mood is overall good, no episodes of agitation or hallucinations.  He is walking with a walker, does well with this.  They reside in a senior living apartment, he will go down to the meal hall and bring back their meals.  He has trouble sleeping, partly due to incontinence issues, does have daytime drowsiness.  He has periods of constipation followed by diarrhea.  His  wife thinks he could benefit more from trazodone, which she was previously on.  Does a few days a week of chair boxing class.  Participates in [place] dominoes with other residents that requires math skills.  Presents today for evaluation accompanied by his wife [name].  HISTORY  04/21/2020 [name]: Devin Vance is a 87 year old right-handed white male with a history of a progressive dementing illness consistent with Alzheimer's disease.  The patient has had episodes of agitation in the past, he is currently on Depakote 500 mg at night and Seroquel 25 mg at night.  The wife indicates that he sleeps well at night but also sleeps all day long.  He is minimally active, he has not had any hallucinations or agitation.  He walks with a walker for longer distances.  He did have some parkinsonian features with Risperdal, his walking has improved some stopping the medication but he still take short shuffling steps.  He recently was reduced on his metoprolol dose due to some reduction in blood pressures.  He returns for an evaluation.  REVIEW OF SYSTEMS: Out of a complete 14 system review of symptoms, the patient complains only of the following symptoms, and all other reviewed systems are negative.  Memory loss, walking difficulty.  ALLERGIES: Allergies  Allergen Reactions  . Lorazepam Other (See Comments)    Generalized weakness    HOME MEDICATIONS: Outpatient Medications Prior to Visit  Medication Sig Dispense Refill  . aspirin EC 81 MG tablet Take by mouth.    Marland Kitchen  CALCIUM CITRATE PO Take 300 mg by mouth.    . Cholecalciferol (VITAMIN D-3 PO) Take 1,000 units of lipase by mouth.    . divalproex (DEPAKOTE) 250 MG DR tablet Take 1 tablet (250 mg total) by mouth at bedtime. 30 tablet 0  . hydroxychloroquine (PLAQUENIL) 200 MG tablet Take 200 mg by mouth daily. One pill 200mg  every other day , other days two pills    . ketoconazole (NIZORAL) 2 % shampoo     . Lactobacillus (PROBIOTIC ACIDOPHILUS PO) Take 1  tablet by mouth daily.    Marland Kitchen levothyroxine (SYNTHROID) 25 MCG tablet 1 tablet in the morning on an empty stomach    . memantine (NAMENDA) 10 MG tablet Take 1 tablet (10 mg total) by mouth 2 (two) times daily. 180 tablet 3  . Multiple Vitamin (MULTIVITAMIN) capsule Take by mouth.    . Omega-3 Fatty Acids (FISH OIL) 1200 MG CAPS Take by mouth.    . QUEtiapine (SEROQUEL) 25 MG tablet Take 1 tablet each night 30 tablet 5  . Cyanocobalamin (VITAMIN B12) 1000 MCG TBCR 1 tablet    . metoprolol succinate (TOPROL-XL) 25 MG 24 hr tablet Take 12.5 mg by mouth.     . Lutein 20 MG CAPS Take 40 mg by mouth daily.      No facility-administered medications prior to visit.    PAST MEDICAL HISTORY: Past Medical History:  Diagnosis Date  . Alzheimer disease ([place]) 07/31/2018  . Angina pectoris ([place])   . Arthritis   . Cardiomyopathy ([place])   . COPD (chronic obstructive pulmonary disease) ([place])   . Gastroesophageal reflux disease   . History of colon polyps   . Hyperlipidemia   . Hypertension   . Obesity   . Osteoporosis   . Polymyalgia rheumatica ([place])   . Prostate cancer ([place])   . Rheumatoid arthritis ([place])   . Secondary Parkinson disease ([place]) 08/13/2019  . Sleep apnea     PAST SURGICAL HISTORY: Past Surgical History:  Procedure Laterality Date  . CATARACT EXTRACTION    . CORONARY ANGIOPLASTY    . HEMORRHOIDECTOMY WITH HEMORRHOID BANDING    . PROSTATECTOMY    . STRABISMUS SURGERY    . TONSILLECTOMY      FAMILY HISTORY: Family History  Problem Relation Age of Onset  . Stroke Mother   . Dementia Mother   . Cancer Father   . Dementia Sister   . Dementia Maternal Aunt     SOCIAL HISTORY: Social History   Socioeconomic History  . Marital status: Married    Spouse name: Not on file  . Number of children: Not on file  . Years of education: Not on file  . Highest education level: Not on file  Occupational History  . Not on file  Tobacco Use  . Smoking status: Never Smoker  .  Smokeless tobacco: Never Used  Substance and Sexual Activity  . Alcohol use: Not Currently  . Drug use: Not Currently  . Sexual activity: Not on file  Other Topics Concern  . Not on file  Social History Narrative  . Not on file   Social Determinants of Health   Financial Resource Strain: Not on file  Food Insecurity: Not on file  Transportation Needs: Not on file  Physical Activity: Not on file  Stress: Not on file  Social Connections: Not on file  Intimate Partner Violence: Not on file   PHYSICAL EXAM  Vitals:   02/27/21 1240  BP: 127/77  Pulse: Marland Kitchen)  52  Weight: 178 lb (80.7 kg)  Height: 5\' 10"  (1.778 m)   Body mass index is 25.54 kg/m.  Generalized: Well developed, in no acute distress   Neurological examination  Mentation: Alert, cooperative, most history is provided by his wife. Follows all commands, speech and language fluent.  Masking of the face is seen, voice is low. Cranial nerve II-XII: Pupils were equal round reactive to light. Extraocular movements were full, visual field were full on confrontational test. Facial sensation and strength were normal.  Head turning and shoulder shrug  were normal and symmetric. Motor: Good strength all extremities.  No tremor noted. Mild bradykinesia. Sensory: Sensory testing is intact to soft touch on all 4 extremities. No evidence of extinction is noted.  Coordination: Cerebellar testing reveals good finger-nose-finger and heel-to-shin bilaterally, but is slow Gait and station: Rises with fair ease, but does briefly push off, has a stooped posture, decreased arm swing bilaterally, no tremor, able to walk to check out independently, short shuffling steps.  Did not bring walker today.  DIAGNOSTIC DATA (LABS, IMAGING, TESTING) - I reviewed patient records, labs, notes, testing and imaging myself where available.  Lab Results  Component Value Date   WBC 5.6 02/16/2021   HGB 10.7 (L) 02/16/2021   HCT 34.3 (L) 02/16/2021   MCV  93.5 02/16/2021   PLT 145 (L) 02/16/2021      Component Value Date/Time   NA 141 02/16/2021 1334   NA 144 07/15/2019 1407   NA 145 04/09/2014 1759   K 4.1 02/16/2021 1334   K 4.2 04/09/2014 1759   CL 108 02/16/2021 1334   CL 112 (H) 04/09/2014 1759   CO2 28 02/16/2021 1334   CO2 27 04/09/2014 1759   GLUCOSE 85 02/16/2021 1334   GLUCOSE 117 (H) 04/09/2014 1759   BUN 24 (H) 02/16/2021 1334   BUN 35 (H) 07/15/2019 1407   BUN 30 (H) 04/09/2014 1759   CREATININE 1.53 (H) 02/16/2021 1334   CREATININE 1.61 (H) 04/09/2014 1759   CALCIUM 8.5 (L) 02/16/2021 1334   CALCIUM 8.6 04/09/2014 1759   PROT 5.0 (L) 02/16/2021 1334   PROT 5.9 (L) 07/15/2019 1407   PROT 6.8 04/09/2014 1759   ALBUMIN 2.9 (L) 02/16/2021 1334   ALBUMIN 4.0 07/15/2019 1407   ALBUMIN 3.5 04/09/2014 1759   AST 17 02/16/2021 1334   AST 34 04/09/2014 1759   ALT 14 02/16/2021 1334   ALT 35 04/09/2014 1759   ALKPHOS 41 02/16/2021 1334   ALKPHOS 62 04/09/2014 1759   BILITOT 0.3 02/16/2021 1334   BILITOT 0.3 07/15/2019 1407   BILITOT 0.3 04/09/2014 1759   GFRNONAA 43 (L) 02/16/2021 1334   GFRNONAA 39 (L) 04/09/2014 1759   GFRAA 47 (L) 07/15/2019 1407   GFRAA 45 (L) 04/09/2014 1759   Lab Results  Component Value Date   CHOL 114 03/22/2014   HDL 38 (L) 03/22/2014   LDLCALC 61 03/22/2014   TRIG 76 03/22/2014   No results found for: HGBA1C No results found for: VITAMINB12 Lab Results  Component Value Date   TSH 4.69 (H) 03/22/2014    ASSESSMENT AND PLAN 87 y.o. year old male  has a past medical history of Alzheimer disease ([place]) (07/31/2018), Angina pectoris ([place]), Arthritis, Cardiomyopathy ([place]), COPD (chronic obstructive pulmonary disease) ([place]), Gastroesophageal reflux disease, History of colon polyps, Hyperlipidemia, Hypertension, Obesity, Osteoporosis, Polymyalgia rheumatica ([place]), Prostate cancer ([place]), Rheumatoid arthritis ([place]), Secondary Parkinson disease ([place]) (08/13/2019), and Sleep apnea. here  with:  1.  Alzheimer's disease 2.  Gait disorder  -Have noted mild parkinsonian features, holding off on starting medications -Will remain off Depakote, continue Seroquel 25 mg at bedtime, Namenda -If agitation is an issue again, may consider 1.5 tablets of Seroquel, or even PRN Xanax, but we discussed the less medication the better due to potential side effects -I did write for a light weight rolling walker  -Follow-up in 6 months or sooner if needed  I spent 34 minutes of face-to-face and non-face-to-face time with patient.  This included previsit chart review, lab review, study review, order entry, reviewing medications, potential side effects, plan for agitation in future.  Butler Denmark, AGNP-C, DNP 02/27/2021, 12:52 PM [facility] [address] [place], [place] [address] [phone]

## 2021-02-27 NOTE — Progress Notes (Signed)
I have read the note, and I agree with the clinical assessment and plan.  Devin Vance   

## 2021-02-28 ENCOUNTER — Encounter (HOSPITAL_BASED_OUTPATIENT_CLINIC_OR_DEPARTMENT_OTHER): Payer: Medicare Other | Attending: Internal Medicine | Admitting: Internal Medicine

## 2021-02-28 ENCOUNTER — Other Ambulatory Visit: Payer: Self-pay

## 2021-02-28 DIAGNOSIS — K219 Gastro-esophageal reflux disease without esophagitis: Secondary | ICD-10-CM | POA: Insufficient documentation

## 2021-02-28 DIAGNOSIS — N183 Chronic kidney disease, stage 3 unspecified: Secondary | ICD-10-CM | POA: Diagnosis not present

## 2021-02-28 DIAGNOSIS — M069 Rheumatoid arthritis, unspecified: Secondary | ICD-10-CM | POA: Diagnosis not present

## 2021-02-28 DIAGNOSIS — G309 Alzheimer's disease, unspecified: Secondary | ICD-10-CM

## 2021-02-28 DIAGNOSIS — L89302 Pressure ulcer of unspecified buttock, stage 2: Secondary | ICD-10-CM | POA: Diagnosis not present

## 2021-02-28 DIAGNOSIS — I2510 Atherosclerotic heart disease of native coronary artery without angina pectoris: Secondary | ICD-10-CM | POA: Diagnosis not present

## 2021-02-28 DIAGNOSIS — I129 Hypertensive chronic kidney disease with stage 1 through stage 4 chronic kidney disease, or unspecified chronic kidney disease: Secondary | ICD-10-CM | POA: Insufficient documentation

## 2021-02-28 NOTE — Progress Notes (Signed)
BRAYDYN, SCHULTES ([id number]) Visit Report for 02/28/2021 Abuse/Suicide Risk Screen Details Patient Name: Date of Service: Devin Vance [facility] S. 02/28/2021 1:15 PM Medical Record Number: [id number] Patient Account Number: [id number] Date of Birth/Sex: Treating RN: 05-May-1928 (87 y.o. Devin Vance Primary Care Naod Sweetland: Wenda Low Other Clinician: Referring Mustaf Antonacci: Treating Camara Rosander/Extender: Rhetta Mura Weeks in Treatment: 0 Abuse/Suicide Risk Screen Items Answer ABUSE RISK SCREEN: Has anyone close to you tried to hurt or harm you recentlyo No Do you feel uncomfortable with anyone in your familyo No Has anyone forced you do things that you didnt want to doo No Electronic Signature(s) Signed: 02/28/2021 5:35:12 PM By: Baruch Gouty RN, BSN Entered By: Baruch Gouty on 02/28/2021 13:45:19 -------------------------------------------------------------------------------- Activities of Daily Living Details Patient Name: Date of Service: Devin Vance [facility] S. 02/28/2021 1:15 PM Medical Record Number: [id number] Patient Account Number: [id number] Date of Birth/Sex: Treating RN: 1928-04-13 (87 y.o. Devin Vance Primary Care Silvester Reierson: Wenda Low Other Clinician: Referring Adalie Mand: Treating Kaysi Ourada/Extender: Bryan Lemma in Treatment: 0 Activities of Daily Living Items Answer Activities of Daily Living (Please select one for each item) Drive Automobile Not Able T Medications ake Need Assistance Use T elephone Need Assistance Care for Appearance Need Assistance Use T oilet Need Assistance Bath / Shower Need Assistance Dress Self Need Assistance Feed Self Completely Able Walk Need Assistance Get In / Out Bed Need Assistance Housework Not Able Prepare Meals Not Able Handle Money Not Able Shop for Self Not Able Electronic Signature(s) Signed: 02/28/2021 5:35:12 PM By: Baruch Gouty RN, BSN Entered By: Baruch Gouty on  02/28/2021 13:46:26 -------------------------------------------------------------------------------- Education Screening Details Patient Name: Date of Service: Devin Vance, [name] 02/28/2021 1:15 PM Medical Record Number: [id number] Patient Account Number: [id number] Date of Birth/Sex: Treating RN: 03-27-1928 (87 y.o. Devin Vance Primary Care Norman Piacentini: Wenda Low Other Clinician: Referring Axie Hayne: Treating Shakia Sebastiano/Extender: Bryan Lemma in Treatment: 0 Primary Learner Assessed: Caregiver spouse Reason Patient is not Primary Learner: dementia Learning Preferences/Education Level/Primary Language Learning Preference: Explanation, Demonstration, Printed Material Highest Education Level: College or Above Preferred Language: English Cognitive Barrier Language Barrier: No Translator Needed: No Memory Deficit: Yes Emotional Barrier: No Cultural/Religious Beliefs Affecting Medical Care: No Physical Barrier Impaired Vision: No Impaired Hearing: No Decreased Hand dexterity: No Knowledge/Comprehension Knowledge Level: High Comprehension Level: High Ability to understand written instructions: High Ability to understand verbal instructions: High Motivation Anxiety Level: Calm Cooperation: Cooperative Education Importance: Acknowledges Need Interest in Health Problems: Asks Questions Perception: Coherent Willingness to Engage in Self-Management High Activities: Readiness to Engage in Self-Management High Activities: Electronic Signature(s) Signed: 02/28/2021 5:35:12 PM By: Baruch Gouty RN, BSN Entered By: Baruch Gouty on 02/28/2021 13:47:16 -------------------------------------------------------------------------------- Fall Risk Assessment Details Patient Name: Date of Service: Devin Vance, [name] 02/28/2021 1:15 PM Medical Record Number: [id number] Patient Account Number: [id number] Date of Birth/Sex: Treating RN: 21-Dec-1927 (87 y.o. Devin Vance Primary Care Blondie Riggsbee: Wenda Low Other Clinician: Referring Faatimah Spielberg: Treating Keyarah Mcroy/Extender: Bryan Lemma in Treatment: 0 Fall Risk Assessment Items Have you had 2 or more falls in the last 12 monthso 0 No Have you had any fall that resulted in injury in the last 12 monthso 0 No FALLS RISK SCREEN History of falling - immediate or within 3 months 0 No Secondary diagnosis (Do you have 2 or more medical diagnoseso) 0 No Ambulatory aid None/bed rest/wheelchair/nurse 0 No Crutches/cane/walker 15 Yes Furniture 0 No Intravenous therapy Access/Saline/Heparin [employer]  0 No Gait/Transferring Normal/ bed rest/ wheelchair 0 No Weak (short steps with or without shuffle, stooped but able to lift head while walking, may seek 10 Yes support from furniture) Impaired (short steps with shuffle, may have difficulty arising from chair, head down, impaired 0 No balance) Mental Status Oriented to own ability 0 Yes Electronic Signature(s) Signed: 02/28/2021 5:35:12 PM By: Baruch Gouty RN, BSN Entered By: Baruch Gouty on 02/28/2021 13:47:38 -------------------------------------------------------------------------------- Foot Assessment Details Patient Name: Date of Service: Devin Vance, [name] 02/28/2021 1:15 PM Medical Record Number: [id number] Patient Account Number: [id number] Date of Birth/Sex: Treating RN: 1928-07-31 (87 y.o. Devin Vance Primary Care Brynna Dobos: Wenda Low Other Clinician: Referring Currie Dennin: Treating Viriginia Amendola/Extender: Rhetta Mura Weeks in Treatment: 0 Foot Assessment Items Site Locations + = Sensation present, - = Sensation absent, C = Callus, U = Ulcer R = Redness, W = Warmth, M = Maceration, PU = Pre-ulcerative lesion F = Fissure, S = Swelling, D = Dryness Assessment Right: Left: Other Deformity: No No Prior Foot Ulcer: No No Prior Amputation: No No Charcot Joint: No No Ambulatory  Status: Ambulatory With Help Assistance Device: Walker Gait: Steady Electronic Signature(s) Signed: 02/28/2021 5:35:12 PM By: Baruch Gouty RN, BSN Entered By: Baruch Gouty on 02/28/2021 13:48:32 -------------------------------------------------------------------------------- Nutrition Risk Screening Details Patient Name: Date of Service: Devin Vance [facility] S. 02/28/2021 1:15 PM Medical Record Number: [id number] Patient Account Number: [id number] Date of Birth/Sex: Treating RN: [birth year] (87 y.o. Devin Vance Primary Care Sherry Rogus: Wenda Low Other Clinician: Referring Janayla Marik: Treating Jahliyah Trice/Extender: Rhetta Mura Weeks in Treatment: 0 Height (in): 70 Weight (lbs): 180 Body Mass Index (BMI): 25.8 Nutrition Risk Screening Items Score Screening NUTRITION RISK SCREEN: I have an illness or condition that made me change the kind and/or amount of food I eat 0 No I eat fewer than two meals per day 0 No I eat few fruits and vegetables, or milk products 0 No I have three or more drinks of beer, liquor or wine almost every day 0 No I have tooth or mouth problems that make it hard for me to eat 0 No I don't always have enough money to buy the food I need 0 No I eat alone most of the time 0 No I take three or more different prescribed or over-the-counter drugs a day 1 Yes Without wanting to, I have lost or gained 10 pounds in the last six months 0 No I am not always physically able to shop, cook and/or feed myself 0 No Nutrition Protocols Good Risk Protocol 0 No interventions needed Moderate Risk Protocol High Risk Proctocol Risk Level: Good Risk Score: 1 Electronic Signature(s) Signed: 02/28/2021 5:35:12 PM By: Baruch Gouty RN, BSN Entered By: Baruch Gouty on 02/28/2021 13:48:18

## 2021-02-28 NOTE — Progress Notes (Signed)
BRYKER, FLETCHALL ([id number]) Visit Report for 02/28/2021 Chief Complaint Document Details Patient Name: Date of Service: Devin Vance [facility] S. 02/28/2021 1:15 PM Medical Record Number: [id number] Patient Account Number: [id number] Date of Birth/Sex: Treating RN: 1928-09-14 (87 y.o. Devin Vance, Devin Vance Primary Care Provider: Wenda Low Other Clinician: Referring Provider: Treating Provider/Extender: Rhetta Mura Weeks in Treatment: 0 Information Obtained from: Patient Chief Complaint Buttocks wounds that occurred 1 month ago that have closed and would like further advice on preventative measures Electronic Signature(s) Signed: 02/28/2021 2:42:20 PM By: Kalman Shan DO Entered By: Kalman Shan on 02/28/2021 14:30:35 -------------------------------------------------------------------------------- HPI Details Patient Name: Date of Service: Devin Vance, [name] 02/28/2021 1:15 PM Medical Record Number: [id number] Patient Account Number: [id number] Date of Birth/Sex: Treating RN: 07/22/1928 (87 y.o. Devin Vance, Devin Vance Primary Care Provider: Wenda Low Other Clinician: Referring Provider: Treating Provider/Extender: Bryan Lemma in Treatment: 0 History of Present Illness HPI Description: Devin Vance is a 87 year old male with a past medical history of Alzheimer's, stage III kidney disease And rheumatoid arthritis that presents to the clinic for pressure sores on his buttocks that have closed. The patient had wounds limited to skin breakdown that started about 1 month ago. Wife and daughter are present And provide most of the history. His wife states that he will have episodes of diarrhea that cause the skin to breakdown. He was seen by urgent care on 4/22 for these wounds and was prescribed doxycycline for cellulitis. He started using Balmex daily and this helped with wound healing. Over the course of the last few weeks his wounds  have improved and currently has no open wounds. He sleeps in a recliner at night and uses a cushion called PURAP. He is mobile with a walker. He currently denies signs of infection. Electronic Signature(s) Signed: 02/28/2021 2:42:20 PM By: Kalman Shan DO Entered By: Kalman Shan on 02/28/2021 14:34:43 -------------------------------------------------------------------------------- Physical Exam Details Patient Name: Date of Service: Devin Vance [facility] S. 02/28/2021 1:15 PM Medical Record Number: [id number] Patient Account Number: [id number] Date of Birth/Sex: Treating RN: 15-Dec-1927 (87 y.o. Devin Vance Primary Care Provider: Other Clinician: Wenda Low Referring Provider: Treating Provider/Extender: Rhetta Mura Weeks in Treatment: 0 Constitutional respirations regular, non-labored and within target range for patient.Marland Kitchen Psychiatric pleasant and cooperative. Notes Buttocks with mild erythema and epithelialization to previous wound sites. No signs of infection Electronic Signature(s) Signed: 02/28/2021 2:42:20 PM By: Kalman Shan DO Entered By: Kalman Shan on 02/28/2021 14:35:25 -------------------------------------------------------------------------------- Physician Orders Details Patient Name: Date of Service: Devin Vance, [name] 02/28/2021 1:15 PM Medical Record Number: [id number] Patient Account Number: [id number] Date of Birth/Sex: Treating RN: 11-07-1927 (87 y.o. Devin Vance, Devin Vance Primary Care Provider: Wenda Low Other Clinician: Referring Provider: Treating Provider/Extender: Rhetta Mura Weeks in Treatment: 0 Verbal / Phone Orders: No Diagnosis Coding ICD-10 Coding Code Description L89.302 Pressure ulcer of unspecified buttock, stage 2 G30.9 Alzheimer's disease, unspecified N18.30 Chronic kidney disease, stage 3 unspecified M06.9 Rheumatoid arthritis, unspecified Follow-up Appointments Other: - no  need to follow up!!:) please call us if you have any future concerns!!:):):) Discharge From [facility] Services Discharge from [place] Off-Loading Turn and reposition every 2 hours Other: - continue using your water cushion in the chair!! Non Wound Condition Protect area with: - Apply zinc oxide to bottom daily..may apply more than once a day, but if pt. starts to have open sores again, be sure to apply zinc oxide 3-4 times  a day Electronic Signature(s) Signed: 02/28/2021 2:42:20 PM By: Kalman Shan DO Entered By: Kalman Shan on 02/28/2021 14:35:39 -------------------------------------------------------------------------------- Problem List Details Patient Name: Date of Service: Devin Vance, [name] 02/28/2021 1:15 PM Medical Record Number: [id number] Patient Account Number: [id number] Date of Birth/Sex: Treating RN: 10-29-1927 (87 y.o. Devin Vance, Devin Vance Primary Care Provider: Wenda Low Other Clinician: Referring Provider: Treating Provider/Extender: Rhetta Mura Weeks in Treatment: 0 Active Problems ICD-10 Encounter Code Description Active Date MDM Diagnosis L89.302 Pressure ulcer of unspecified buttock, stage 2 02/28/2021 No Yes G30.9 Alzheimer's disease, unspecified 02/28/2021 No Yes N18.30 Chronic kidney disease, stage 3 unspecified 02/28/2021 No Yes M06.9 Rheumatoid arthritis, unspecified 02/28/2021 No Yes Inactive Problems Resolved Problems Electronic Signature(s) Signed: 02/28/2021 2:42:20 PM By: Kalman Shan DO Entered By: Kalman Shan on 02/28/2021 14:29:31 -------------------------------------------------------------------------------- Progress Note Details Patient Name: Date of Service: Devin Vance, [name] 02/28/2021 1:15 PM Medical Record Number: [id number] Patient Account Number: [id number] Date of Birth/Sex: Treating RN: 1928/10/04 (87 y.o. Devin Vance, Devin Vance Primary Care Provider: Wenda Low Other Clinician: Referring  Provider: Treating Provider/Extender: Bryan Lemma in Treatment: 0 Subjective Chief Complaint Information obtained from Patient Buttocks wounds that occurred 1 month ago that have closed and would like further advice on preventative measures History of Present Illness (HPI) Devin Vance is a 87 year old male with a past medical history of Alzheimer's, stage III kidney disease And rheumatoid arthritis that presents to the clinic for pressure sores on his buttocks that have closed. The patient had wounds limited to skin breakdown that started about 1 month ago. Wife and daughter are present And provide most of the history. His wife states that he will have episodes of diarrhea that cause the skin to breakdown. He was seen by urgent care on 4/22 for these wounds and was prescribed doxycycline for cellulitis. He started using Balmex daily and this helped with wound healing. Over the course of the last few weeks his wounds have improved and currently has no open wounds. He sleeps in a recliner at night and uses a cushion called PURAP. He is mobile with a walker. He currently denies signs of infection. Patient History Information obtained from Patient. Allergies lorazepam (Reaction: unknown) Family History Cancer - Father,Siblings, Stroke - Mother, No family history of Diabetes, Heart Disease, Hereditary Spherocytosis, Hypertension, Kidney Disease, Lung Disease, Seizures, Thyroid Problems, Tuberculosis. Social History Never smoker, Marital Status - Married, Alcohol Use - Rarely, Drug Use - No History, Caffeine Use - Rarely. Medical History Eyes Patient has history of Cataracts - bil removed Denies history of Glaucoma, Optic Neuritis Ear/Nose/Mouth/Throat Denies history of Chronic sinus problems/congestion, Middle ear problems Respiratory Patient has history of Chronic Obstructive Pulmonary Disease (COPD), Sleep Apnea - no CPAP Cardiovascular Patient has  history of Angina, Coronary Artery Disease, Hypertension Endocrine Denies history of Type I Diabetes, Type II Diabetes Genitourinary Denies history of End Stage Renal Disease Integumentary (Skin) Denies history of History of Burn Musculoskeletal Patient has history of Rheumatoid Arthritis Denies history of Osteoarthritis Neurologic Patient has history of Dementia - alzheimers Denies history of Neuropathy, Quadriplegia, Paraplegia, Seizure Disorder Oncologic Denies history of Received Chemotherapy, Received Radiation Psychiatric Denies history of Anorexia/bulimia, Confinement Anxiety Hospitalization/Surgery History - cardiac cath with stent. - prostatectomy. - cataract extraction. - strabismus surgery. - hemorrhoidectomy. - tonsillectomy. Medical A Surgical History Notes nd Cardiovascular cardiomyopathy, hyperlipidemia Gastrointestinal GERD Genitourinary CKD stage 3, incontinence Musculoskeletal kyphosis Oncologic prostate cancer Review of Systems (ROS) Constitutional Symptoms (General Health) Denies  complaints or symptoms of Fatigue, Fever, Chills, Marked Weight Change. Eyes Denies complaints or symptoms of Dry Eyes, Vision Changes, Glasses / Contacts. Ear/Nose/Mouth/Throat Denies complaints or symptoms of Chronic sinus problems or rhinitis. Respiratory Denies complaints or symptoms of Chronic or frequent coughs, Shortness of Breath. Cardiovascular Denies complaints or symptoms of Chest pain. Gastrointestinal Complains or has symptoms of Frequent diarrhea - every few days. Endocrine Denies complaints or symptoms of Heat/cold intolerance. Genitourinary Denies complaints or symptoms of Frequent urination. Integumentary (Skin) Complains or has symptoms of Wounds - buttocks. Musculoskeletal Complains or has symptoms of Muscle Weakness. Neurologic Denies complaints or symptoms of Numbness/parasthesias. Psychiatric Denies complaints or symptoms of Claustrophobia,  Suicidal. Objective Constitutional respirations regular, non-labored and within target range for patient.. Vitals Time Taken: 1:29 PM, Height: 70 in, Source: Stated, Weight: 180 lbs, Source: Stated, BMI: 25.8, Temperature: 97.7 F, Pulse: 51 bpm, Respiratory Rate: 18 breaths/min, Blood Pressure: 115/60 mmHg. Psychiatric pleasant and cooperative. General Notes: Buttocks with mild erythema and epithelialization to previous wound sites. No signs of infection Assessment Active Problems ICD-10 Pressure ulcer of unspecified buttock, stage 2 Alzheimer's disease, unspecified Chronic kidney disease, stage 3 unspecified Rheumatoid arthritis, unspecified Today the patient does not have any open wounds. He previously had pressure sores to his bottom that healed over the past several weeks. I recommended he use Balmex daily to help as a barrier to when he has episodes of diarrhea. I Also recommended offloading the area when he is sitting in his recliner for long periods. I also gave the patient's wife a brochure for an air mattress that we can order if he does develop a wound. I think the purap cushion for his recliner is a good option at this time. He can follow-up as needed if the wound does develop. Plan Follow-up Appointments: Other: - no need to follow up!!:) please call us if you have any future concerns!!:):):) Discharge From [facility] Services: Discharge from [place] Off-Loading: Turn and reposition every 2 hours Other: - continue using your water cushion in the chair!! Non Wound Condition: Protect area with: - Apply zinc oxide to bottom daily..may apply more than once a day, but if pt. starts to have open sores again, be sure to apply zinc oxide 3- 4 times a day 1. Balmex daily 2. Offloading 3. Follow-up as needed Electronic Signature(s) Signed: 02/28/2021 2:42:20 PM By: Kalman Shan DO Entered By: Kalman Shan on 02/28/2021  14:41:30 -------------------------------------------------------------------------------- HxROS Details Patient Name: Date of Service: Devin Vance, [name] 02/28/2021 1:15 PM Medical Record Number: [id number] Patient Account Number: [id number] Date of Birth/Sex: Treating RN: February 10, 1928 (87 y.o. Devin Vance Primary Care Provider: Wenda Low Other Clinician: Referring Provider: Treating Provider/Extender: Bryan Lemma in Treatment: 0 Information Obtained From Patient Constitutional Symptoms (General Health) Complaints and Symptoms: Negative for: Fatigue; Fever; Chills; Marked Weight Change Eyes Complaints and Symptoms: Negative for: Dry Eyes; Vision Changes; Glasses / Contacts Medical History: Positive for: Cataracts - bil removed Negative for: Glaucoma; Optic Neuritis Ear/Nose/Mouth/Throat Complaints and Symptoms: Negative for: Chronic sinus problems or rhinitis Medical History: Negative for: Chronic sinus problems/congestion; Middle ear problems Respiratory Complaints and Symptoms: Negative for: Chronic or frequent coughs; Shortness of Breath Medical History: Positive for: Chronic Obstructive Pulmonary Disease (COPD); Sleep Apnea - no CPAP Cardiovascular Complaints and Symptoms: Negative for: Chest pain Medical History: Positive for: Angina; Coronary Artery Disease; Hypertension Past Medical History Notes: cardiomyopathy, hyperlipidemia Gastrointestinal Complaints and Symptoms: Positive for: Frequent diarrhea - every few days  Medical History: Past Medical History Notes: GERD Endocrine Complaints and Symptoms: Negative for: Heat/cold intolerance Medical History: Negative for: Type I Diabetes; Type II Diabetes Genitourinary Complaints and Symptoms: Negative for: Frequent urination Medical History: Negative for: End Stage Renal Disease Past Medical History Notes: CKD stage 3, incontinence Integumentary (Skin) Complaints and  Symptoms: Positive for: Wounds - buttocks Medical History: Negative for: History of Burn Musculoskeletal Complaints and Symptoms: Positive for: Muscle Weakness Medical History: Positive for: Rheumatoid Arthritis Negative for: Osteoarthritis Past Medical History Notes: kyphosis Neurologic Complaints and Symptoms: Negative for: Numbness/parasthesias Medical History: Positive for: Dementia - alzheimers Negative for: Neuropathy; Quadriplegia; Paraplegia; Seizure Disorder Psychiatric Complaints and Symptoms: Negative for: Claustrophobia; Suicidal Medical History: Negative for: Anorexia/bulimia; Confinement Anxiety Hematologic/Lymphatic Immunological Oncologic Medical History: Negative for: Received Chemotherapy; Received Radiation Past Medical History Notes: prostate cancer HBO Extended History Items Eyes: Cataracts Immunizations Pneumococcal Vaccine: Received Pneumococcal Vaccination: Yes Implantable Devices Yes Hospitalization / Surgery History Type of Hospitalization/Surgery cardiac cath with stent prostatectomy cataract extraction strabismus surgery hemorrhoidectomy tonsillectomy Family and Social History Cancer: Yes - Father,Siblings; Diabetes: No; Heart Disease: No; Hereditary Spherocytosis: No; Hypertension: No; Kidney Disease: No; Lung Disease: No; Seizures: No; Stroke: Yes - Mother; Thyroid Problems: No; Tuberculosis: No; Never smoker; Marital Status - Married; Alcohol Use: Rarely; Drug Use: No History; Caffeine Use: Rarely; Financial Concerns: No; Food, Clothing or Shelter Needs: No; Support System Lacking: No; Transportation Concerns: No Electronic Signature(s) Signed: 02/28/2021 2:42:20 PM By: Kalman Shan DO Signed: 02/28/2021 5:35:12 PM By: Baruch Gouty RN, BSN Entered By: Baruch Gouty on 02/28/2021 13:45:07 -------------------------------------------------------------------------------- SuperBill Details Patient Name: Date of Service: Devin Vance, [name] 02/28/2021 Medical Record Number: [id number] Patient Account Number: [id number] Date of Birth/Sex: Treating RN: 11/22/1927 (87 y.o. Devin Vance, Devin Vance Primary Care Provider: Wenda Low Other Clinician: Referring Provider: Treating Provider/Extender: Rhetta Mura Weeks in Treatment: 0 Diagnosis Coding ICD-10 Codes Code Description [id number] Pressure ulcer of unspecified buttock, stage 2 G30.9 Alzheimer's disease, unspecified N18.30 Chronic kidney disease, stage 3 unspecified M06.9 Rheumatoid arthritis, unspecified Facility Procedures CPT4 Code: [id number] Description: 99213 - WOUND CARE VISIT-LEV 3 EST PT Modifier: Quantity: 1 Physician Procedures : CPT4 Code Description Modifier [id number] WC PHYS LEVEL 3 NEW PT ICD-10 Diagnosis Description L89.302 Pressure ulcer of unspecified buttock, stage 2 G30.9 Alzheimer's disease, unspecified Quantity: 1 Electronic Signature(s) Signed: 02/28/2021 2:42:20 PM By: Kalman Shan DO Entered By: Kalman Shan on 02/28/2021 14:41:54

## 2021-02-28 NOTE — Progress Notes (Signed)
ARVAL, BRANDSTETTER ([id number]) Visit Report for 02/28/2021 Allergy List Details Patient Name: Date of Service: Dewitt Rota [facility] S. 02/28/2021 1:15 PM Medical Record Number: [id number] Patient Account Number: [id number] Date of Birth/Sex: Treating RN: [birth year] (87 y.o. Ernestene Mention Primary Care Tiaunna Buford: Wenda Low Other Clinician: Referring Yvonne Petite: Treating Kierstan Auer/Extender: Rhetta Mura Weeks in Treatment: 0 Allergies Active Allergies lorazepam Reaction: unknown Allergy Notes Electronic Signature(s) Signed: 02/28/2021 5:35:12 PM By: Baruch Gouty RN, BSN Entered By: Baruch Gouty on 02/28/2021 13:31:20 -------------------------------------------------------------------------------- Arrival Information Details Patient Name: Date of Service: Adalberto Ill, [name] 02/28/2021 1:15 PM Medical Record Number: [id number] Patient Account Number: [id number] Date of Birth/Sex: Treating RN: [birth year] (87 y.o. Ernestene Mention Primary Care Kie Calvin: Wenda Low Other Clinician: Referring Jazper Nikolai: Treating Lorelee Mclaurin/Extender: Bryan Lemma in Treatment: 0 Visit Information Patient Arrived: Charlyn Minerva Time: 13:26 Accompanied By: spouse, daughter Transfer Assistance: None Patient Identification Verified: Yes Secondary Verification Process Completed: Yes Patient Requires Transmission-Based Precautions: No Patient Has Alerts: No Electronic Signature(s) Signed: 02/28/2021 5:35:12 PM By: Baruch Gouty RN, BSN Entered By: Baruch Gouty on 02/28/2021 13:29:25 -------------------------------------------------------------------------------- Clinic Level of Care Assessment Details Patient Name: Date of Service: Dewitt Rota [facility] S. 02/28/2021 1:15 PM Medical Record Number: [id number] Patient Account Number: [id number] Date of Birth/Sex: Treating RN: [birth year] (87 y.o. Burnadette Pop, Lauren Primary Care Evonda Enge: Wenda Low Other  Clinician: Referring Rubyann Lingle: Treating Peggye Poon/Extender: Rhetta Mura Weeks in Treatment: 0 Clinic Level of Care Assessment Items TOOL 2 Quantity Score X- 1 0 Use when only an EandM is performed on the INITIAL visit ASSESSMENTS - Nursing Assessment / Reassessment X- 1 20 General Physical Exam (combine w/ comprehensive assessment (listed just below) when performed on new pt. evals) X- 1 25 Comprehensive Assessment (HX, ROS, Risk Assessments, Wounds Hx, etc.) ASSESSMENTS - Wound and Skin A ssessment / Reassessment X - Simple Wound Assessment / Reassessment - one wound 1 5 []  - 0 Complex Wound Assessment / Reassessment - multiple wounds X- 1 10 Dermatologic / Skin Assessment (not related to wound area) ASSESSMENTS - Ostomy and/or Continence Assessment and Care []  - 0 Incontinence Assessment and Management []  - 0 Ostomy Care Assessment and Management (repouching, etc.) PROCESS - Coordination of Care X - Simple Patient / Family Education for ongoing care 1 15 []  - 0 Complex (extensive) Patient / Family Education for ongoing care X- 1 10 Staff obtains Programmer, systems, Records, T Results / Process Orders est []  - 0 Staff telephones HHA, Nursing Homes / Clarify orders / etc []  - 0 Routine Transfer to another Facility (non-emergent condition) []  - 0 Routine Hospital Admission (non-emergent condition) X- 1 15 New Admissions / Biomedical engineer / Ordering NPWT Apligraf, etc. , []  - 0 Emergency Hospital Admission (emergent condition) X- 1 10 Simple Discharge Coordination []  - 0 Complex (extensive) Discharge Coordination PROCESS - Special Needs []  - 0 Pediatric / Minor Patient Management []  - 0 Isolation Patient Management []  - 0 Hearing / Language / Visual special needs []  - 0 Assessment of Community assistance (transportation, D/C planning, etc.) []  - 0 Additional assistance / Altered mentation []  - 0 Support Surface(s) Assessment (bed, cushion,  seat, etc.) INTERVENTIONS - Wound Cleansing / Measurement []  - 0 Wound Imaging (photographs - any number of wounds) []  - 0 Wound Tracing (instead of photographs) []  - 0 Simple Wound Measurement - one wound []  - 0 Complex Wound Measurement - multiple wounds []  - 0 Simple Wound Cleansing - one  wound []  - 0 Complex Wound Cleansing - multiple wounds INTERVENTIONS - Wound Dressings []  - 0 Small Wound Dressing one or multiple wounds []  - 0 Medium Wound Dressing one or multiple wounds []  - 0 Large Wound Dressing one or multiple wounds []  - 0 Application of Medications - injection INTERVENTIONS - Miscellaneous []  - 0 External ear exam []  - 0 Specimen Collection (cultures, biopsies, blood, body fluids, etc.) []  - 0 Specimen(s) / Culture(s) sent or taken to Lab for analysis []  - 0 Patient Transfer (multiple staff / Harrel Lemon Lift / Similar devices) []  - 0 Simple Staple / Suture removal (25 or less) []  - 0 Complex Staple / Suture removal (26 or more) []  - 0 Hypo / Hyperglycemic Management (close monitor of Blood Glucose) []  - 0 Ankle / Brachial Index (ABI) - do not check if billed separately Has the patient been seen at the hospital within the last three years: Yes Total Score: 110 Level Of Care: New/Established - Level 3 Electronic Signature(s) Signed: 02/28/2021 5:09:03 PM By: Rhae Hammock RN Entered By: Rhae Hammock on 02/28/2021 14:10:04 -------------------------------------------------------------------------------- Encounter Discharge Information Details Patient Name: Date of Service: Adalberto Ill, [name] 02/28/2021 1:15 PM Medical Record Number: [id number] Patient Account Number: [id number] Date of Birth/Sex: Treating RN: 11-14-1927 (87 y.o. Marcheta Grammes Primary Care Adonijah Baena: Wenda Low Other Clinician: Referring Meng Winterton: Treating Genesys Coggeshall/Extender: Bryan Lemma in Treatment: 0 Encounter Discharge Information Items Discharge  Condition: Stable Ambulatory Status: Walker Discharge Destination: Home Transportation: Private Auto Accompanied ByRaechel Chute, Daughter Schedule Follow-up Appointment: No Clinical Summary of Care: Provided on 02/28/2021 Form Type Recipient Paper Patient Patient Electronic Signature(s) Signed: 02/28/2021 2:29:36 PM By: Lorrin Jackson Entered By: Lorrin Jackson on 02/28/2021 14:29:36 -------------------------------------------------------------------------------- Lower Extremity Assessment Details Patient Name: Date of Service: Dewitt Rota [facility] S. 02/28/2021 1:15 PM Medical Record Number: [id number] Patient Account Number: [id number] Date of Birth/Sex: Treating RN: [birth year] (87 y.o. Ernestene Mention Primary Care Journie Howson: Wenda Low Other Clinician: Referring Fani Rotondo: Treating Arwin Bisceglia/Extender: Rhetta Mura Weeks in Treatment: 0 Electronic Signature(s) Signed: 02/28/2021 5:35:12 PM By: Baruch Gouty RN, BSN Entered By: Baruch Gouty on 02/28/2021 13:48:39 -------------------------------------------------------------------------------- Multi Wound Chart Details Patient Name: Date of Service: Adalberto Ill, [name] 02/28/2021 1:15 PM Medical Record Number: [id number] Patient Account Number: [id number] Date of Birth/Sex: Treating RN: 07/29/1928 (87 y.o. Burnadette Pop, Lauren Primary Care Jossie Smoot: Wenda Low Other Clinician: Referring Zania Kalisz: Treating Nikeya Maxim/Extender: Rhetta Mura Weeks in Treatment: 0 Vital Signs Height(in): 70 Pulse(bpm): 51 Weight(lbs): 180 Blood Pressure(mmHg): 115/60 Body Mass Index(BMI): 26 Temperature(F): 97.7 Respiratory Rate(breaths/min): 18 Wound Assessments Treatment Notes Electronic Signature(s) Signed: 02/28/2021 2:42:20 PM By: Kalman Shan DO Signed: 02/28/2021 5:09:03 PM By: Rhae Hammock RN Entered By: Kalman Shan on 02/28/2021  14:29:36 -------------------------------------------------------------------------------- Multi-Disciplinary Care Plan Details Patient Name: Date of Service: Adalberto Ill, [name] 02/28/2021 1:15 PM Medical Record Number: [id number] Patient Account Number: [id number] Date of Birth/Sex: Treating RN: Feb 23, 1928 (87 y.o. Burnadette Pop, Lauren Primary Care Dominik Yordy: Wenda Low Other Clinician: Referring Kayton Ripp: Treating Shakema Surita/Extender: Rhetta Mura Weeks in Treatment: 0 Active Inactive Electronic Signature(s) Signed: 02/28/2021 5:09:03 PM By: Rhae Hammock RN Entered By: Rhae Hammock on 02/28/2021 14:07:36 -------------------------------------------------------------------------------- Pain Assessment Details Patient Name: Date of Service: Dewitt Rota [facility] S. 02/28/2021 1:15 PM Medical Record Number: [id number] Patient Account Number: [id number] Date of Birth/Sex: Treating RN: 1928-01-24 (87 y.o. Ernestene Mention Primary Care Dequane Strahan: Wenda Low Other Clinician: Referring Lequita Meadowcroft: Treating Dayonna Selbe/Extender:  Vilinda Boehringer, Karrar Weeks in Treatment: 0 Active Problems Location of Pain Severity and Description of Pain Patient Has Paino No Site Locations Rate the pain. Current Pain Level: 0 Pain Management and Medication Current Pain Management: Electronic Signature(s) Signed: 02/28/2021 5:35:12 PM By: Baruch Gouty RN, BSN Entered By: Baruch Gouty on 02/28/2021 13:54:30 -------------------------------------------------------------------------------- Patient/Caregiver Education Details Patient Name: Date of Service: Dewitt Rota [facility] S. 5/17/2022andnbsp1:15 PM Medical Record Number: [id number] Patient Account Number: [id number] Date of Birth/Gender: Treating RN: 22-Jul-1928 (87 y.o. Erie Noe Primary Care Physician: Wenda Low Other Clinician: Referring Physician: Treating Physician/Extender: Bryan Lemma in Treatment: 0 Education Assessment Education Provided To: Patient and Caregiver Education Topics Provided Basic Hygiene: Methods: Demonstration, Explain/Verbal, Printed, Video Responses: State content correctly Electronic Signature(s) Signed: 02/28/2021 5:09:03 PM By: Rhae Hammock RN Entered By: Rhae Hammock on 02/28/2021 14:07:49 -------------------------------------------------------------------------------- Vitals Details Patient Name: Date of Service: [name], [name] 02/28/2021 1:15 PM Medical Record Number: [id number] Patient Account Number: [id number] Date of Birth/Sex: Treating RN: Jan 28, 1928 (87 y.o. Ernestene Mention Primary Care Yulanda Diggs: Wenda Low Other Clinician: Referring Awab Abebe: Treating Jmya Uliano/Extender: Rhetta Mura Weeks in Treatment: 0 Vital Signs Time Taken: 13:29 Temperature (F): 97.7 Height (in): 70 Pulse (bpm): 51 Source: Stated Respiratory Rate (breaths/min): 18 Weight (lbs): 180 Blood Pressure (mmHg): 115/60 Source: Stated Reference Range: 80 - 120 mg / dl Body Mass Index (BMI): 25.8 Electronic Signature(s) Signed: 02/28/2021 5:35:12 PM By: Baruch Gouty RN, BSN Entered By: Baruch Gouty on 02/28/2021 13:30:22

## 2021-03-03 ENCOUNTER — Other Ambulatory Visit: Payer: Self-pay | Admitting: Neurology

## 2021-03-03 ENCOUNTER — Telehealth: Payer: Self-pay | Admitting: Neurology

## 2021-03-03 MED ORDER — QUETIAPINE FUMARATE 25 MG PO TABS: ORAL_TABLET | ORAL | 5 refills | Status: DC

## 2021-03-03 NOTE — Progress Notes (Signed)
Refills done per wife request

## 2021-03-03 NOTE — Telephone Encounter (Signed)
Refill done.  

## 2021-03-03 NOTE — Telephone Encounter (Signed)
Pt wife, Colbe Viviano request refill QUEtiapine (SEROQUEL) 25 MG tablet at [place] #[id number]. Pt is out of medication

## 2021-03-06 DIAGNOSIS — Z20822 Contact with and (suspected) exposure to covid-19: Secondary | ICD-10-CM | POA: Diagnosis not present

## 2021-03-06 DIAGNOSIS — Z20828 Contact with and (suspected) exposure to other viral communicable diseases: Secondary | ICD-10-CM | POA: Diagnosis not present

## 2021-03-06 MED ORDER — QUETIAPINE FUMARATE 25 MG PO TABS: ORAL_TABLET | ORAL | 5 refills | Status: DC

## 2021-03-06 NOTE — Telephone Encounter (Signed)
[facility] [place], [place] AT [facility] OF [place]  Phone:  [phone]

## 2021-03-09 DIAGNOSIS — E039 Hypothyroidism, unspecified: Secondary | ICD-10-CM | POA: Diagnosis not present

## 2021-03-09 DIAGNOSIS — Z0000 Encounter for general adult medical examination without abnormal findings: Secondary | ICD-10-CM | POA: Diagnosis not present

## 2021-03-09 DIAGNOSIS — I10 Essential (primary) hypertension: Secondary | ICD-10-CM | POA: Diagnosis not present

## 2021-03-09 DIAGNOSIS — I700 Atherosclerosis of aorta: Secondary | ICD-10-CM | POA: Diagnosis not present

## 2021-03-09 DIAGNOSIS — J449 Chronic obstructive pulmonary disease, unspecified: Secondary | ICD-10-CM | POA: Diagnosis not present

## 2021-03-09 DIAGNOSIS — I779 Disorder of arteries and arterioles, unspecified: Secondary | ICD-10-CM | POA: Diagnosis not present

## 2021-03-09 DIAGNOSIS — E785 Hyperlipidemia, unspecified: Secondary | ICD-10-CM | POA: Diagnosis not present

## 2021-03-09 DIAGNOSIS — G629 Polyneuropathy, unspecified: Secondary | ICD-10-CM | POA: Diagnosis not present

## 2021-03-09 DIAGNOSIS — Z8546 Personal history of malignant neoplasm of prostate: Secondary | ICD-10-CM | POA: Diagnosis not present

## 2021-03-09 DIAGNOSIS — G309 Alzheimer's disease, unspecified: Secondary | ICD-10-CM | POA: Diagnosis not present

## 2021-03-09 DIAGNOSIS — R7303 Prediabetes: Secondary | ICD-10-CM | POA: Diagnosis not present

## 2021-03-09 DIAGNOSIS — I011 Acute rheumatic endocarditis: Secondary | ICD-10-CM | POA: Diagnosis not present

## 2021-03-09 DIAGNOSIS — I429 Cardiomyopathy, unspecified: Secondary | ICD-10-CM | POA: Diagnosis not present

## 2021-03-13 DIAGNOSIS — Z20828 Contact with and (suspected) exposure to other viral communicable diseases: Secondary | ICD-10-CM | POA: Diagnosis not present

## 2021-03-17 ENCOUNTER — Encounter (HOSPITAL_COMMUNITY): Payer: Self-pay | Admitting: Emergency Medicine

## 2021-03-17 ENCOUNTER — Ambulatory Visit (INDEPENDENT_AMBULATORY_CARE_PROVIDER_SITE_OTHER): Payer: Medicare Other

## 2021-03-17 ENCOUNTER — Other Ambulatory Visit: Payer: Self-pay

## 2021-03-17 ENCOUNTER — Ambulatory Visit (HOSPITAL_COMMUNITY)
Admission: EM | Admit: 2021-03-17 | Discharge: 2021-03-17 | Disposition: A | Discharge status: Home / Self Care | Payer: Medicare Other | Attending: Emergency Medicine | Admitting: Emergency Medicine

## 2021-03-17 DIAGNOSIS — J449 Chronic obstructive pulmonary disease, unspecified: Secondary | ICD-10-CM | POA: Insufficient documentation

## 2021-03-17 DIAGNOSIS — R0602 Shortness of breath: Secondary | ICD-10-CM | POA: Insufficient documentation

## 2021-03-17 DIAGNOSIS — R059 Cough, unspecified: Secondary | ICD-10-CM | POA: Diagnosis not present

## 2021-03-17 DIAGNOSIS — G4730 Sleep apnea, unspecified: Secondary | ICD-10-CM | POA: Insufficient documentation

## 2021-03-17 DIAGNOSIS — Z20822 Contact with and (suspected) exposure to covid-19: Secondary | ICD-10-CM | POA: Diagnosis not present

## 2021-03-17 DIAGNOSIS — Z79899 Other long term (current) drug therapy: Secondary | ICD-10-CM | POA: Insufficient documentation

## 2021-03-17 LAB — POC INFLUENZA A AND B ANTIGEN (URGENT CARE ONLY)
INFLUENZA A ANTIGEN, POC: NEGATIVE
INFLUENZA B ANTIGEN, POC: NEGATIVE

## 2021-03-17 MED ORDER — ALBUTEROL SULFATE HFA 108 (90 BASE) MCG/ACT IN AERS: 2.0000 | INHALATION_SPRAY | RESPIRATORY_TRACT | 0 refills | Status: DC | PRN

## 2021-03-17 NOTE — Discharge Instructions (Addendum)
Can use tylenol 650 mg every 6 hours for fevere  Can use over the counter medications for cough  Can take 2 puffs of inhaler every 4-5 hours as needed for wheezing and shortness of breath  May return for reevaluation for worsening shortness of breath, persistent cough longer than 3 weeks, chest pain, drowsiness

## 2021-03-17 NOTE — ED Triage Notes (Signed)
Pt presents with cough and fever xs 2 days. Wife states gets tested weekly at assisted living and was tested Monday with negative result.

## 2021-03-17 NOTE — ED Provider Notes (Addendum)
[place]    CSN: [id number] Arrival date & time: 03/17/21  1330      History   Chief Complaint Chief Complaint  Patient presents with  . Cough  . Fever    HPI Devin Vance is a 87 y.o. male.   Patient presents with non productive cough, fever peaking at 100.3, shortness of breath at rest, fatigue and wheezing for two days. Denies abdominal pain, sore throat, vomiting, diarrhea, headache, ear pain. Has not attempted treatment. COVID test 5/31 negative, facility does weekly testing.   All history obtained from wife and daughter, patient has alzheimer disease.  Past Medical History:  Diagnosis Date  . Alzheimer disease ([place]) 07/31/2018  . Angina pectoris ([place])   . Arthritis   . Cardiomyopathy ([place])   . COPD (chronic obstructive pulmonary disease) ([place])   . Gastroesophageal reflux disease   . History of colon polyps   . Hyperlipidemia   . Hypertension   . Obesity   . Osteoporosis   . Polymyalgia rheumatica ([place])   . Prostate cancer ([place])   . Rheumatoid arthritis ([place])   . Secondary Parkinson disease ([place]) 08/13/2019  . Sleep apnea     Patient Active Problem List   Diagnosis Date Noted  . AMS (altered mental status) 02/16/2021  . Gait abnormality 07/15/2019  . Cardiomyopathy ([place]) 03/24/2019  . COPD (chronic obstructive pulmonary disease) ([place]) 03/24/2019  . HLD (hyperlipidemia) 03/24/2019  . Acute renal failure superimposed on stage 3 chronic kidney disease ([place]) 10/28/2018  . CKD (chronic kidney disease) stage 3, GFR 30-59 ml/min (HCC) 10/28/2018  . Rheumatoid arthritis involving multiple sites ([place]) 10/28/2018  . Dementia with behavioral disturbance ([place]) 10/27/2018  . Alzheimer's dementia with behavioral disturbance ([place]) 07/31/2018  . HTN (hypertension) 08/15/2017  . Anemia, mild 02/12/2017  . Olecranon bursitis of right elbow 02/12/2017  . Kyphosis of cervical region 10/26/2015  . Intermittent alternating esotropia 05/18/2015  . GERD  (gastroesophageal reflux disease) 01/22/2014  . Angina pectoris ([place]) 01/22/2014    Past Surgical History:  Procedure Laterality Date  . CATARACT EXTRACTION    . CORONARY ANGIOPLASTY    . HEMORRHOIDECTOMY WITH HEMORRHOID BANDING    . PROSTATECTOMY    . STRABISMUS SURGERY    . TONSILLECTOMY         Home Medications    Prior to Admission medications   Medication Sig Start Date End Date Taking? Authorizing Provider  hydroxychloroquine (PLAQUENIL) 200 MG tablet Take 1 tablet by mouth every morning. 09/12/20  Yes [provider]  memantine (NAMENDA) 10 MG tablet Take 1 tablet by mouth 2 (two) times daily. 04/23/19  Yes [provider]  metoprolol succinate (TOPROL-XL) 25 MG 24 hr tablet  09/12/20  Yes [provider]  QUEtiapine (SEROQUEL) 25 MG tablet Take 1 tablet by mouth at bedtime. 04/04/20  Yes [provider]  rosuvastatin (CRESTOR) 10 MG tablet  04/19/20  Yes [provider]  aspirin EC 81 MG tablet Take by mouth.    [provider]  CALCIUM CITRATE PO Take 300 mg by mouth.    [provider]  Cholecalciferol (VITAMIN D-3 PO) Take 1,000 units of lipase by mouth.    [provider]  Cyanocobalamin (VITAMIN B12) 1000 MCG TBCR 1 tablet    [provider]  hydroxychloroquine (PLAQUENIL) 200 MG tablet Take 200 mg by mouth daily. One pill 200mg  every other day , other days two pills 06/11/19   [provider]  ketoconazole (NIZORAL) 2 %  shampoo  02/13/19   [provider]  Lactobacillus (PROBIOTIC ACIDOPHILUS PO) Take 1 tablet by mouth daily.    [provider]  levothyroxine (SYNTHROID) 25 MCG tablet 1 tablet in the morning on an empty stomach 04/29/20   [provider]  memantine (NAMENDA) 10 MG tablet Take 1 tablet (10 mg total) by mouth 2 (two) times daily. 10/25/20   Suzzanne Cloud, NP  metoprolol succinate (TOPROL-XL) 25 MG 24 hr tablet Take 12.5 mg by mouth.  08/20/16  04/21/20  [provider]  Multiple Vitamin (MULTIVITAMIN) capsule Take by mouth.    [provider]  Omega-3 Fatty Acids (FISH OIL) 1200 MG CAPS Take by mouth.    [provider]  QUEtiapine (SEROQUEL) 25 MG tablet Take 1 tablet each night 03/06/21   Suzzanne Cloud, NP    Family History Family History  Problem Relation Age of Onset  . Stroke Mother   . Dementia Mother   . Cancer Father   . Dementia Sister   . Dementia Maternal Aunt     Social History Social History   Tobacco Use  . Smoking status: Never Smoker  . Smokeless tobacco: Never Used  Substance Use Topics  . Alcohol use: Not Currently  . Drug use: Not Currently     Allergies   Lorazepam   Review of Systems Review of Systems  Constitutional: Positive for fever. Negative for activity change, appetite change, chills, diaphoresis, fatigue and unexpected weight change.  HENT: Negative.   Respiratory: Positive for cough, shortness of breath and wheezing. Negative for apnea, choking and chest tightness.   Cardiovascular: Negative.   Gastrointestinal: Negative.   Musculoskeletal: Negative.   Skin: Negative.   Neurological: Negative.      Physical Exam Triage Vital Signs ED Triage Vitals  Enc Vitals Group     BP 03/17/21 1348 123/67     Pulse Rate 03/17/21 1348 85     Resp 03/17/21 1348 17     Temp 03/17/21 1348 99.3 F (37.4 C)     Temp Source 03/17/21 1348 Oral     SpO2 03/17/21 1348 98 %     Weight --      Height --      Head Circumference --      Peak Flow --      Pain Score 03/17/21 1344 0     Pain Loc --      Pain Edu? --      Excl. in [place]? --    No data found.  Updated Vital Signs BP 123/67 (BP Location: Right Arm)   Pulse 85   Temp 99.3 F (37.4 C) (Oral)   Resp 17   SpO2 98%   Visual Acuity Right Eye Distance:   Left Eye Distance:   Bilateral Distance:    Right Eye Near:   Left Eye Near:    Bilateral Near:     Physical Exam Constitutional:       Appearance: Normal appearance. He is normal weight.  HENT:     Head: Normocephalic.     Right Ear: Tympanic membrane, ear canal and external ear normal.     Left Ear: Tympanic membrane, ear canal and external ear normal.     Nose: Rhinorrhea present. No congestion.     Mouth/Throat:     Mouth: Mucous membranes are moist.     Pharynx: Oropharynx is clear.  Eyes:     Extraocular Movements: Extraocular movements intact.  Conjunctiva/sclera: Conjunctivae normal.     Pupils: Pupils are equal, round, and reactive to light.  Cardiovascular:     Rate and Rhythm: Normal rate and regular rhythm.     Pulses: Normal pulses.     Heart sounds: Normal heart sounds.  Pulmonary:     Effort: Pulmonary effort is normal.     Breath sounds: Normal breath sounds.  Abdominal:     General: Abdomen is flat. Bowel sounds are normal.     Palpations: Abdomen is soft.  Musculoskeletal:        General: Normal range of motion.     Cervical back: Normal range of motion and neck supple.  Skin:    General: Skin is warm and dry.  Neurological:     General: No focal deficit present.     Mental Status: He is alert and oriented to person, place, and time. Mental status is at baseline.  Psychiatric:        Mood and Affect: Mood normal.        Behavior: Behavior normal.        Thought Content: Thought content normal.        Judgment: Judgment normal.      UC Treatments / Results  Labs (all labs ordered are listed, but only abnormal results are displayed) Labs Reviewed  SARS CORONAVIRUS 2 (TAT 6-24 HRS)  POC INFLUENZA A AND B ANTIGEN (URGENT CARE ONLY)    EKG   Radiology No results found.  Procedures Procedures (including critical care time)  Medications Ordered in UC Medications - No data to display  Initial Impression / Assessment and Plan / UC Course  I have reviewed the triage vital signs and the nursing notes.  Pertinent labs & imaging results that were available during my care of the  patient were reviewed by me and considered in my medical decision making (see chart for details).  Cough Shortness of breath   1. covid- pending 2. Flu- negative 3. Chest x-ray - negative for infection or edema 4. Albuterol HFA inhaler 2 puffs every 4 hours prn 5. Advised tylenol or ibuprofen for management of fever, declined cough medications, has medications at home  Final Clinical Impressions(s) / UC Diagnoses   Final diagnoses:  None   Discharge Instructions   None    ED Prescriptions    None     PDMP not reviewed this encounter.   Hans Eden, NP 03/17/21 1551    Hans Eden, NP 03/17/21 1711

## 2021-03-18 LAB — SARS CORONAVIRUS 2 (TAT 6-24 HRS): SARS Coronavirus 2: NEGATIVE

## 2021-03-20 DIAGNOSIS — Z20828 Contact with and (suspected) exposure to other viral communicable diseases: Secondary | ICD-10-CM | POA: Diagnosis not present

## 2021-03-23 DIAGNOSIS — Z20828 Contact with and (suspected) exposure to other viral communicable diseases: Secondary | ICD-10-CM | POA: Diagnosis not present

## 2021-03-27 DIAGNOSIS — Z20828 Contact with and (suspected) exposure to other viral communicable diseases: Secondary | ICD-10-CM | POA: Diagnosis not present

## 2021-03-30 DIAGNOSIS — Z20828 Contact with and (suspected) exposure to other viral communicable diseases: Secondary | ICD-10-CM | POA: Diagnosis not present

## 2021-04-03 DIAGNOSIS — Z20828 Contact with and (suspected) exposure to other viral communicable diseases: Secondary | ICD-10-CM | POA: Diagnosis not present

## 2021-04-06 DIAGNOSIS — Z20828 Contact with and (suspected) exposure to other viral communicable diseases: Secondary | ICD-10-CM | POA: Diagnosis not present

## 2021-04-10 DIAGNOSIS — Z20828 Contact with and (suspected) exposure to other viral communicable diseases: Secondary | ICD-10-CM | POA: Diagnosis not present

## 2021-04-13 DIAGNOSIS — Z20828 Contact with and (suspected) exposure to other viral communicable diseases: Secondary | ICD-10-CM | POA: Diagnosis not present

## 2021-04-18 DIAGNOSIS — Z20828 Contact with and (suspected) exposure to other viral communicable diseases: Secondary | ICD-10-CM | POA: Diagnosis not present

## 2021-04-20 DIAGNOSIS — Z20828 Contact with and (suspected) exposure to other viral communicable diseases: Secondary | ICD-10-CM | POA: Diagnosis not present

## 2021-04-24 DIAGNOSIS — Z20828 Contact with and (suspected) exposure to other viral communicable diseases: Secondary | ICD-10-CM | POA: Diagnosis not present

## 2021-04-25 ENCOUNTER — Ambulatory Visit: Payer: Medicare Other | Admitting: Neurology

## 2021-04-27 DIAGNOSIS — Z20828 Contact with and (suspected) exposure to other viral communicable diseases: Secondary | ICD-10-CM | POA: Diagnosis not present

## 2021-04-27 DIAGNOSIS — M1990 Unspecified osteoarthritis, unspecified site: Secondary | ICD-10-CM | POA: Diagnosis not present

## 2021-04-27 DIAGNOSIS — M159 Polyosteoarthritis, unspecified: Secondary | ICD-10-CM | POA: Diagnosis not present

## 2021-05-01 DIAGNOSIS — Z20828 Contact with and (suspected) exposure to other viral communicable diseases: Secondary | ICD-10-CM | POA: Diagnosis not present

## 2021-05-04 DIAGNOSIS — Z20828 Contact with and (suspected) exposure to other viral communicable diseases: Secondary | ICD-10-CM | POA: Diagnosis not present

## 2021-05-15 DIAGNOSIS — Z20828 Contact with and (suspected) exposure to other viral communicable diseases: Secondary | ICD-10-CM | POA: Diagnosis not present

## 2021-05-22 DIAGNOSIS — Z20828 Contact with and (suspected) exposure to other viral communicable diseases: Secondary | ICD-10-CM | POA: Diagnosis not present

## 2021-05-25 DIAGNOSIS — Z20828 Contact with and (suspected) exposure to other viral communicable diseases: Secondary | ICD-10-CM | POA: Diagnosis not present

## 2021-05-29 DIAGNOSIS — Z20828 Contact with and (suspected) exposure to other viral communicable diseases: Secondary | ICD-10-CM | POA: Diagnosis not present

## 2021-06-08 DIAGNOSIS — Z20828 Contact with and (suspected) exposure to other viral communicable diseases: Secondary | ICD-10-CM | POA: Diagnosis not present

## 2021-06-12 DIAGNOSIS — Z20828 Contact with and (suspected) exposure to other viral communicable diseases: Secondary | ICD-10-CM | POA: Diagnosis not present

## 2021-06-15 DIAGNOSIS — Z20822 Contact with and (suspected) exposure to covid-19: Secondary | ICD-10-CM | POA: Diagnosis not present

## 2021-06-22 DIAGNOSIS — Z20828 Contact with and (suspected) exposure to other viral communicable diseases: Secondary | ICD-10-CM | POA: Diagnosis not present

## 2021-06-29 DIAGNOSIS — Z23 Encounter for immunization: Secondary | ICD-10-CM | POA: Diagnosis not present

## 2021-07-06 DIAGNOSIS — Z20828 Contact with and (suspected) exposure to other viral communicable diseases: Secondary | ICD-10-CM | POA: Diagnosis not present

## 2021-07-20 DIAGNOSIS — Z20828 Contact with and (suspected) exposure to other viral communicable diseases: Secondary | ICD-10-CM | POA: Diagnosis not present

## 2021-07-27 DIAGNOSIS — Z8616 Personal history of COVID-19: Secondary | ICD-10-CM | POA: Diagnosis not present

## 2021-07-27 IMAGING — CT CT HEAD W/O CM
3 series · 16 of 47 positions shown, 19 images · non-contrast
Comparison: None.

CLINICAL DATA: Altered mental status.

EXAM:
CT HEAD WITHOUT CONTRAST
TECHNIQUE: Contiguous axial images were obtained from the base of the skull
through the vertex without intravenous contrast.

[Series 2: head 5.0 h30s · axial · 0.41mm/px · z∈[-205,-65]mm · 10 of 34 slices shown, 13 images]
[im 3/34  brain]
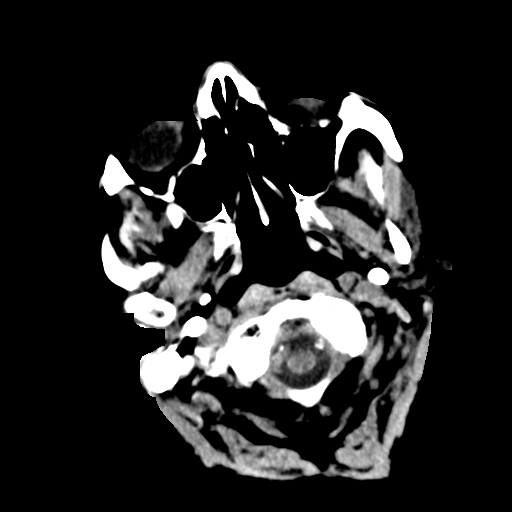
[im 3/34  bone]
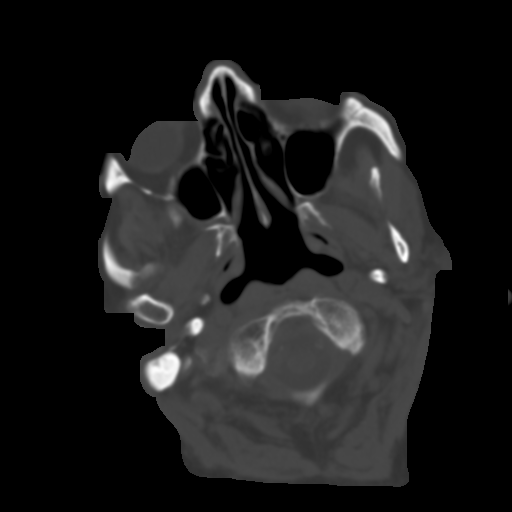
[im 6/34  brain]
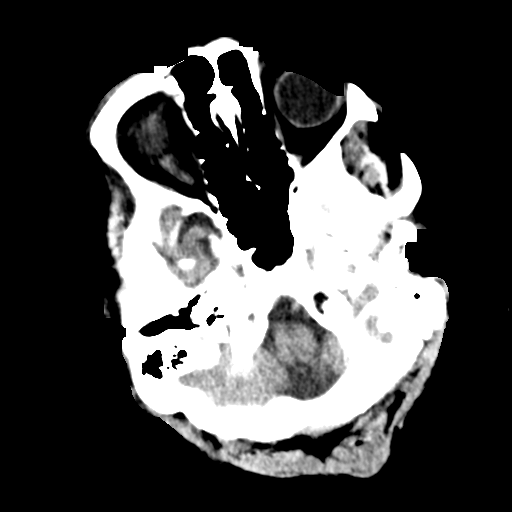
[im 10/34  brain]
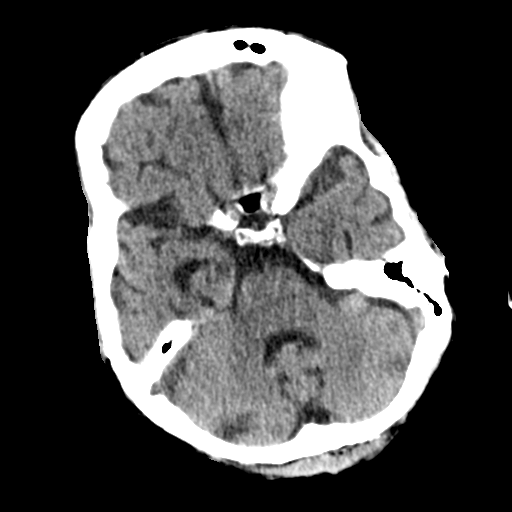
[im 12/34  brain]
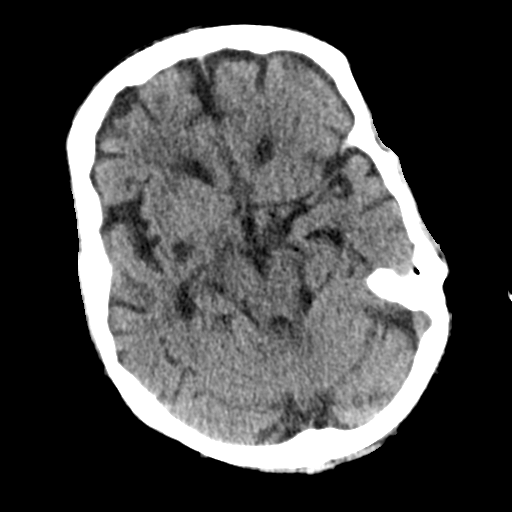
[im 15/34  brain]
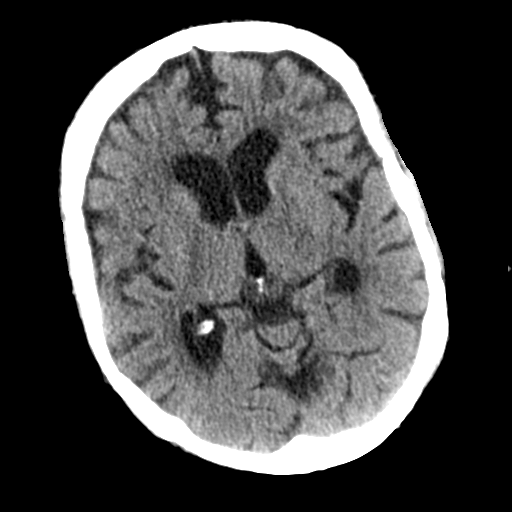
[im 15/34  bone]
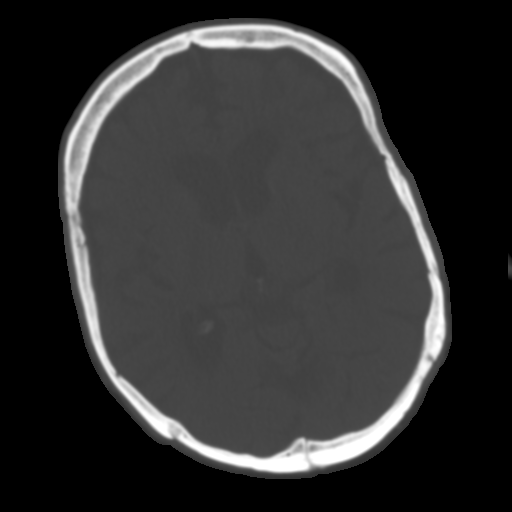
[im 19/34  brain]
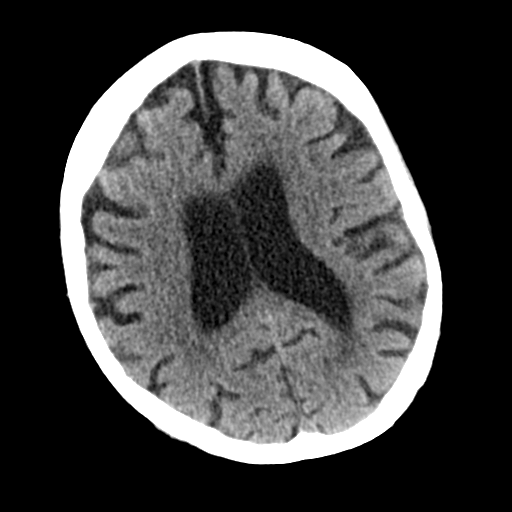
[im 22/34  brain]
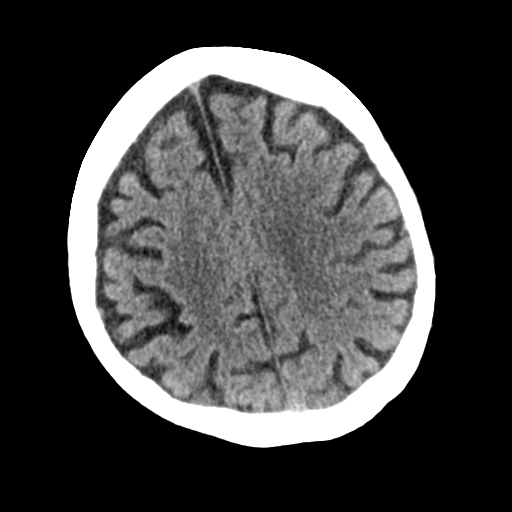
[im 26/34  brain]
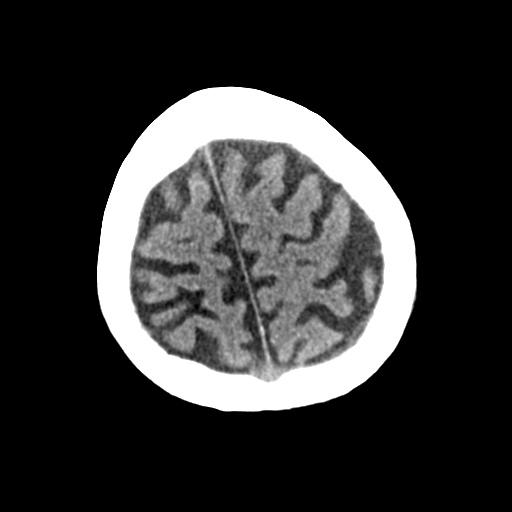
[im 28/34  brain]
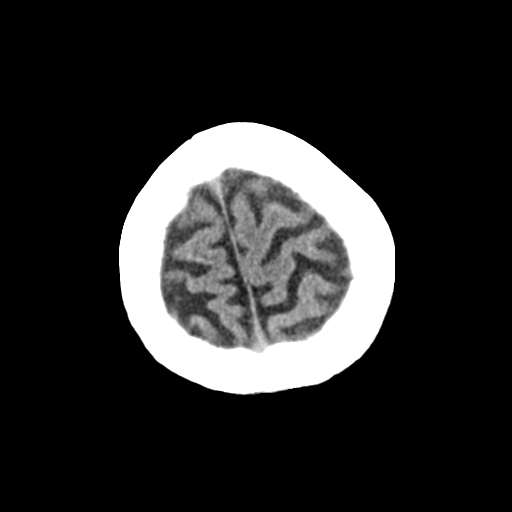
[im 28/34  bone]
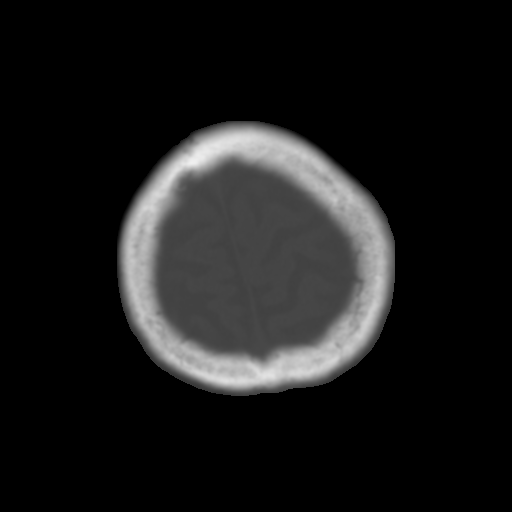
[im 31/34  brain]
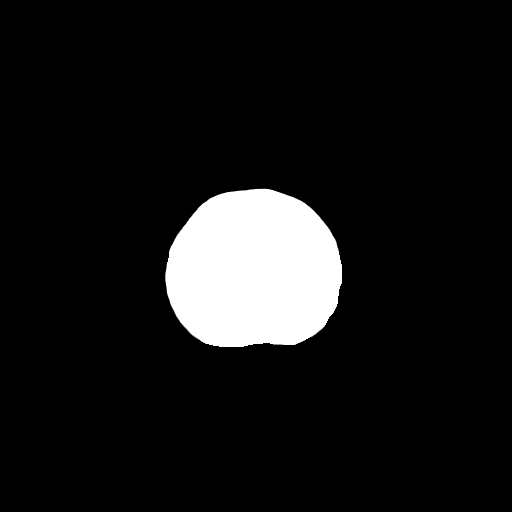

[Series 4: head 3.0 mpr cor · coronal · 0.32mm/px · 3 of 68 slices shown]
[im 23/68  brain]
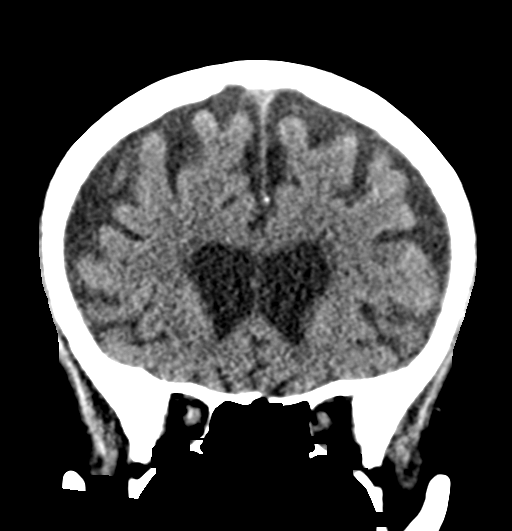
[im 30/68  brain]
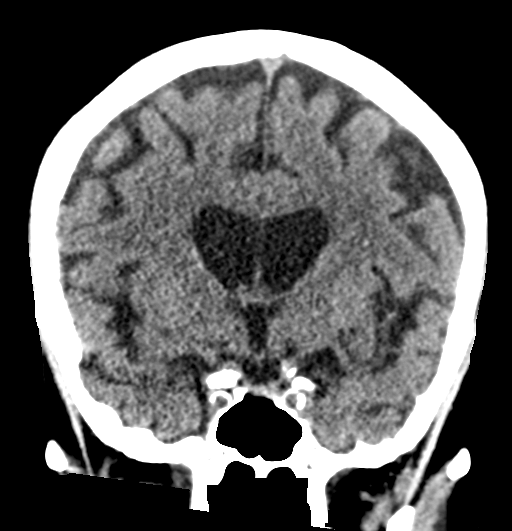
[im 38/68  brain]
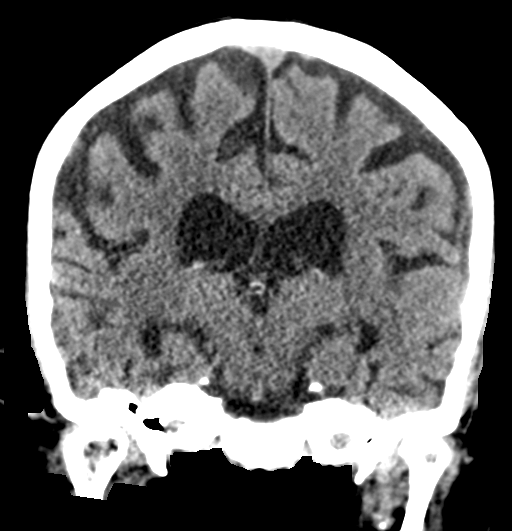

[Series 5: head 3.0 mpr sag · sagittal · 0.33mm/px · 3 of 55 slices shown]
[im 20/55  brain]
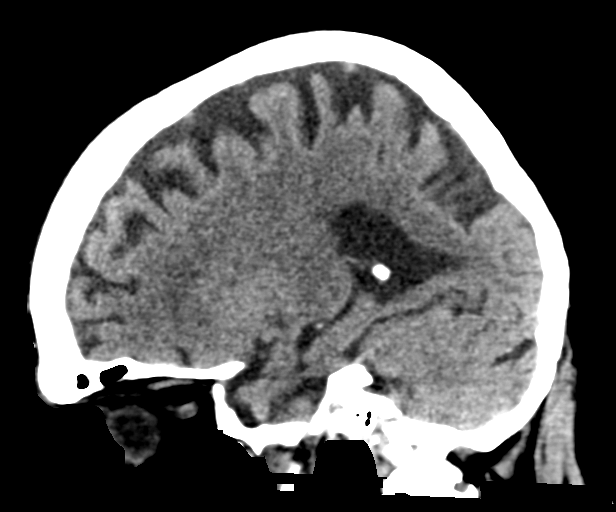
[im 28/55  brain]
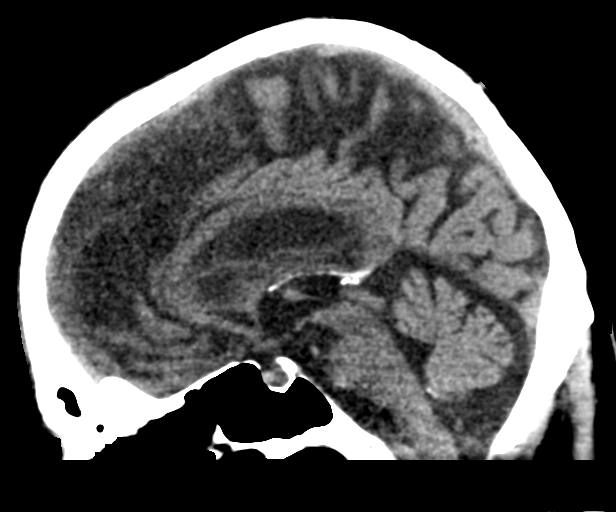
[im 35/55  brain]
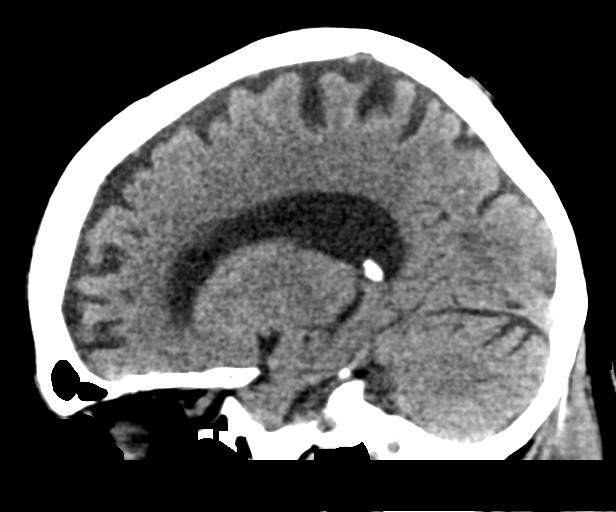

[16 of 47 positions shown; findings below may reference images not displayed]

FINDINGS: Brain: No evidence of acute infarction, hemorrhage, hydrocephalus,
extra-axial collection or mass lesion/mass effect. Atrophy and
chronic microvascular ischemic change noted.

Vascular: No hyperdense vessel or unexpected calcification.

Skull: Intact.  No focal lesion.

Sinuses/Orbits: Small left mastoid effusion is identified.

Other: None.
IMPRESSION: No acute abnormality.

Atrophy and chronic microvascular ischemic change.

Small left mastoid effusion.

## 2021-08-29 DIAGNOSIS — Z23 Encounter for immunization: Secondary | ICD-10-CM | POA: Diagnosis not present

## 2021-09-04 ENCOUNTER — Other Ambulatory Visit: Payer: Self-pay | Admitting: *Deleted

## 2021-09-04 MED ORDER — QUETIAPINE FUMARATE 25 MG PO TABS: ORAL_TABLET | ORAL | 2 refills | Status: DC

## 2021-09-05 DIAGNOSIS — M069 Rheumatoid arthritis, unspecified: Secondary | ICD-10-CM | POA: Diagnosis not present

## 2021-09-05 DIAGNOSIS — I779 Disorder of arteries and arterioles, unspecified: Secondary | ICD-10-CM | POA: Diagnosis not present

## 2021-09-05 DIAGNOSIS — I2510 Atherosclerotic heart disease of native coronary artery without angina pectoris: Secondary | ICD-10-CM | POA: Diagnosis not present

## 2021-09-05 DIAGNOSIS — I10 Essential (primary) hypertension: Secondary | ICD-10-CM | POA: Diagnosis not present

## 2021-09-05 DIAGNOSIS — N1831 Chronic kidney disease, stage 3a: Secondary | ICD-10-CM | POA: Diagnosis not present

## 2021-09-05 DIAGNOSIS — E039 Hypothyroidism, unspecified: Secondary | ICD-10-CM | POA: Diagnosis not present

## 2021-09-05 DIAGNOSIS — I700 Atherosclerosis of aorta: Secondary | ICD-10-CM | POA: Diagnosis not present

## 2021-09-05 DIAGNOSIS — J449 Chronic obstructive pulmonary disease, unspecified: Secondary | ICD-10-CM | POA: Diagnosis not present

## 2021-09-05 DIAGNOSIS — E785 Hyperlipidemia, unspecified: Secondary | ICD-10-CM | POA: Diagnosis not present

## 2021-09-05 DIAGNOSIS — R7303 Prediabetes: Secondary | ICD-10-CM | POA: Diagnosis not present

## 2021-09-05 DIAGNOSIS — G629 Polyneuropathy, unspecified: Secondary | ICD-10-CM | POA: Diagnosis not present

## 2021-09-05 DIAGNOSIS — G309 Alzheimer's disease, unspecified: Secondary | ICD-10-CM | POA: Diagnosis not present

## 2021-09-12 ENCOUNTER — Ambulatory Visit: Payer: Medicare Other | Admitting: Neurology

## 2021-09-14 ENCOUNTER — Telehealth: Payer: Self-pay | Admitting: Neurology

## 2021-09-14 ENCOUNTER — Ambulatory Visit: Payer: Medicare Other | Admitting: Adult Health

## 2021-09-14 NOTE — Telephone Encounter (Signed)
Pt's wife cancelled appt due having another medical appt must keep.

## 2021-09-14 NOTE — Telephone Encounter (Signed)
Assigned [name]' patient to Dr. Jaynee Eagles on 11/16/21. Pt's wife insisted to be rescheduled with a physician due to pt's Alzheimer's

## 2021-09-28 DIAGNOSIS — L820 Inflamed seborrheic keratosis: Secondary | ICD-10-CM | POA: Diagnosis not present

## 2021-09-28 DIAGNOSIS — L821 Other seborrheic keratosis: Secondary | ICD-10-CM | POA: Diagnosis not present

## 2021-09-28 DIAGNOSIS — D485 Neoplasm of uncertain behavior of skin: Secondary | ICD-10-CM | POA: Diagnosis not present

## 2021-10-04 DIAGNOSIS — H43813 Vitreous degeneration, bilateral: Secondary | ICD-10-CM | POA: Diagnosis not present

## 2021-10-04 DIAGNOSIS — Z79899 Other long term (current) drug therapy: Secondary | ICD-10-CM | POA: Diagnosis not present

## 2021-10-04 DIAGNOSIS — Z961 Presence of intraocular lens: Secondary | ICD-10-CM | POA: Diagnosis not present

## 2021-10-04 DIAGNOSIS — M069 Rheumatoid arthritis, unspecified: Secondary | ICD-10-CM | POA: Diagnosis not present

## 2021-10-19 DIAGNOSIS — Z20822 Contact with and (suspected) exposure to covid-19: Secondary | ICD-10-CM | POA: Diagnosis not present

## 2021-11-09 DIAGNOSIS — M159 Polyosteoarthritis, unspecified: Secondary | ICD-10-CM | POA: Diagnosis not present

## 2021-11-09 DIAGNOSIS — M1990 Unspecified osteoarthritis, unspecified site: Secondary | ICD-10-CM | POA: Diagnosis not present

## 2021-11-09 DIAGNOSIS — M353 Polymyalgia rheumatica: Secondary | ICD-10-CM | POA: Diagnosis not present

## 2021-11-14 ENCOUNTER — Other Ambulatory Visit: Payer: Self-pay

## 2021-11-14 MED ORDER — MEMANTINE HCL 10 MG PO TABS: 10.0000 mg | ORAL_TABLET | Freq: Two times a day (BID) | ORAL | 0 refills | Status: DC

## 2021-11-14 NOTE — Progress Notes (Signed)
Rx refilled.

## 2021-11-16 ENCOUNTER — Encounter: Payer: Self-pay | Admitting: Neurology

## 2021-11-16 ENCOUNTER — Other Ambulatory Visit: Payer: Self-pay

## 2021-11-16 ENCOUNTER — Ambulatory Visit (INDEPENDENT_AMBULATORY_CARE_PROVIDER_SITE_OTHER): Payer: Medicare Other | Admitting: Neurology

## 2021-11-16 VITALS — BP 102/65 | HR 63 | Ht 70.0 in | Wt 185.0 lb

## 2021-11-16 DIAGNOSIS — F03918 Unspecified dementia, unspecified severity, with other behavioral disturbance: Secondary | ICD-10-CM | POA: Diagnosis not present

## 2021-11-16 NOTE — Progress Notes (Signed)
PATIENT: Devin Vance DOB: 03-12-1928  REASON FOR VISIT: follow up HISTORY FROM: family  11/19/2021: I am meeting this patient with progressive dementia for the first time today.  He is here with his wife and daughter who provide most information.  Extended visit today.  We spoke about patient's history, as a summary he is a 88 year old male with a history of progressive Alzheimer's disease.  Due to his Alzheimer's disease, he has parkinsonian features (not Parkinson's disease), respite all was tried in the past for behavioral issues as well as Depakote but those were discontinued, he is on Namenda, he has been doing well, remains on Seroquel at bedtime, no falls, he uses a walker although he did not bring it today.  Per his family, eating well, weight is stable, no problems with swallowing and choking, they monitor his size of his bites, he was agitated at one time but now there is no agitation. He is due for a checkup in his teeth, he has a runny nose. No recent falls. He takes a boxing class with patient with parkinsonism. [facility]. They got the hospital bed but doesn't use it and they have a lift chair that helps. He can't get in and out of it. She has PT in the building, needs help with getting in and out of the hospital bed, we can get someone in the house to help however they declined, and have a nurse coming out on the 14th and they will discuss that with her. Overall they are doing well and today we reviewed history and answered all questions from family.   HISTORY OF PRESENT ILLNESS: Today 11/19/21 Devin Vance(SS) Devin Vance is a 88 year old male with history of progressive dementing illness consistent with Alzheimer's disease.  He has had parkinsonian features with Risperdal, thus discontinued.  He is on Namenda.  His wife has called reporting more agitation with his aide, Depakote was increased 500 mg at bedtime, took to ER for AMS, Depakote  level was 47. Has remained off since, mood has been doing well.  Is not sleeping as much during the day.  Remains on Seroquel 25 mg at bedtime.  Sleeps fairly well, urinary frequency does wake him up.  No falls, his balance has been good.  He uses a walker, didn't bring it today, too heavy for his wife to lift. Wife gets frustrated when he isn't active enough or sits down to wait for elevator instead of standing. Loves doing chair boxing. The aide has been back and all is well. His wife had eye surgery last week, doing well.  Here today for evaluation accompanied by his wife, and daughter Devin Vance.  In ER, negative for UTI, CBC showed HGB 10.7, platelets 145, creatinine 1.53.   Update 10/25/2020 SS: Mr. Devin Vance is a 88 year old male with history of progressive dementing illness consistent with Alzheimer's disease.  He is on Depakote and Seroquel for episodes of agitation.  He had Parkinson features with Risperdal, walking improved some with Risperdal discontinuation.  Remains overall stable, continues to have some decline in memory, has trouble remembering his children's names.  His mood is overall good, no episodes of agitation or hallucinations.  He is walking with a walker, does well with this.  They reside in a senior living apartment, he will go down to the meal hall and bring back their meals.  He has trouble sleeping, partly due to incontinence issues, does have  daytime drowsiness.  He has periods of constipation followed by diarrhea.  His wife thinks he could benefit more from trazodone, which she was previously on.  Does a few days a week of chair boxing class.  Participates in [employer] dom[employer]es with other residents that requires math skills.  Presents today for evaluation accompanied by his wife Devin Vance.  HISTORY  04/21/2020 Dr. Jannifer Vance: Devin Vance is a 88 year old right-handed white male with a history of a progressive dementing illness consistent with Alzheimer's disease.  The patient has had episodes of agitation  in the past, he is currently on Depakote 500 mg at night and Seroquel 25 mg at night.  The wife indicates that he sleeps well at night but also sleeps all day long.  He is minimally active, he has not had any hallucinations or agitation.  He walks with a walker for longer distances.  He did have some parkinsonian features with Risperdal, his walking has improved some stopping the medication but he still take short shuffling steps.  He recently was reduced on his metoprolol dose due to some reduction in blood pressures.  He returns for an evaluation.  REVIEW OF SYSTEMS: Out of a complete 14 system review of symptoms, the patient complains only of the following symptoms, and all other reviewed systems are negative.  Memory loss, walking difficulty.  ALLERGIES: Allergies  Allergen Reactions   Lorazepam Other (See Comments)    Generalized weakness    HOME MEDICATIONS: Outpatient Medications Prior to Visit  Medication Sig Dispense Refill   albuterol (VENTOLIN HFA) 108 (90 Base) MCG/ACT inhaler Inhale 2 puffs into the lungs every 4 (four) hours as needed for wheezing or shortness of breath. 18 g 0   aspirin EC 81 MG tablet Take by mouth.     CALCIUM CITRATE PO Take 300 mg by mouth.     Cholecalciferol (VITAMIN D-3 PO) Take 1,000 units of lipase by mouth.     Cyanocobalamin (VITAMIN B12) 1000 MCG TBCR 2,500 mcg daily.     Ferrous Sulfate (IRON PO) Take 325 mg by mouth daily.     Fluticasone Propionate (FLONASE ALLERGY RELIEF NA) Place into the nose.     hydroxychloroquine (PLAQUENIL) 200 MG tablet Take 200 mg by mouth daily. One pill 200mg  every other day , other days two pills     hydroxychloroquine (PLAQUENIL) 200 MG tablet Take 1 tablet by mouth every morning.     ketoconazole (NIZORAL) 2 % shampoo      Lactobacillus (PROBIOTIC ACIDOPHILUS PO) Take 1 tablet by mouth daily.     levothyroxine (SYNTHROID) 25 MCG tablet 1 tablet in the morning on an empty stomach     memantine (NAMENDA) 10 MG  tablet Take 1 tablet by mouth 2 (two) times daily.     memantine (NAMENDA) 10 MG tablet Take 1 tablet (10 mg total) by mouth 2 (two) times daily. 180 tablet 0   metoprolol succinate (TOPROL-XL) 25 MG 24 hr tablet      Multiple Vitamin (MULTIVITAMIN) capsule Take by mouth.     Omega-3 Fatty Acids (FISH OIL) 1200 MG CAPS Take by mouth.     QUEtiapine (SEROQUEL) 25 MG tablet Take 1 tablet each night 30 tablet 2   rosuvastatin (CRESTOR) 10 MG tablet      metoprolol succinate (TOPROL-XL) 25 MG 24 hr tablet Take 12.5 mg by mouth.      No facility-administered medications prior to visit.    PAST MEDICAL HISTORY: Past Medical History:  Diagnosis Date  Alzheimer disease ([place]) 07/31/2018   Angina pectoris ([facility])    Arthritis    Cardiomyopathy ([facility])    COPD (chronic obstructive pulmonary disease) ([facility])    Gastroesophageal reflux disease    History of colon polyps    Hyperlipidemia    Hypertension    Obesity    Osteoporosis    Polymyalgia rheumatica (HCC)    Prostate cancer (HCC)    Rheumatoid arthritis ([place])    Secondary Parkinson disease ([place]) 08/13/2019   Sleep apnea     PAST SURGICAL HISTORY: Past Surgical History:  Procedure Laterality Date   CATARACT EXTRACTION     CORONARY ANGIOPLASTY     HEMORRHOIDECTOMY WITH HEMORRHOID BANDING     PROSTATECTOMY     STRABISMUS SURGERY     TONSILLECTOMY      FAMILY HISTORY: Family History  Problem Relation Age of Onset   Stroke Mother    Dementia Mother    Cancer Father    Dementia Sister    Dementia Maternal Aunt     SOCIAL HISTORY: Social History   Socioeconomic History   Marital status: Married    Spouse name: Not on file   Number of children: Not on file   Years of education: Not on file   Highest education level: Not on file  Occupational History   Not on file  Tobacco Use   Smoking status: Never   Smokeless tobacco: Never  Substance and Sexual Activity   Alcohol use: Not Currently   Drug use: Not Currently    Sexual activity: Not on file  Other Topics Concern   Not on file  Social History Narrative   Not on file   Social Determinants of Health   Financial Resource Strain: Not on file  Food Insecurity: Not on file  Transportation Needs: Not on file  Physical Activity: Not on file  Stress: Not on file  Social Connections: Not on file  Intimate Partner Violence: Not on file   PHYSICAL EXAM  Vitals:   11/16/21 1445  BP: 102/65  Pulse: 63  Weight: 185 lb (83.9 kg)  Height: 5\' 10"  (1.778 m)   Body mass index is 26.54 kg/m.  Patient is in no acute distress, he is pleasant, he appears elderly at 7 and slightly frail, he follows all commands, his speech and language are fluent, hypophonia, pupils pinpoint, could not visualize fundi due to small pupils, extraocular movements were full, visual fields full to threat in all quadrants, hypophonia, all extremities are antigravity without focal deficits difficult to do a thorough motor exam with his cognitive status, no tremor was noted, he does walk with stooped posture, low clearance, bradykinesia, no tremor noted, decreased arm swing and shuffling, no significant imbalance.  MMSE - Mini Mental State Exam 12/10/2019 07/15/2019 07/31/2018  Not completed: Unable to complete (No Data) -  Orientation to time 0 3 4  Orientation to Place - 0 3  Registration - 3 3  Attention/ Calculation - 0 5  Recall - 0 0  Language- name 2 objects - 2 2  Language- repeat - 0 1  Language- follow 3 step command - 2 2  Language- follow 3 step command-comments - he held the paper in the air after he folded it -  Language- read & follow direction - 1 1  Write a sentence - 1 1  Copy design - 0 1  Total score - 12 23    DIAGNOSTIC DATA (LABS, IMAGING, TESTING) - I reviewed patient records, labs,  notes, testing and imaging myself where available.  Lab Results  Component Value Date   WBC 5.6 02/16/2021   HGB 10.7 (L) 02/16/2021   HCT 34.3 (L) 02/16/2021   MCV  93.5 02/16/2021   PLT 145 (L) 02/16/2021      Component Value Date/Time   NA 141 02/16/2021 1334   NA 144 07/15/2019 1407   NA 145 04/09/2014 1759   K 4.1 02/16/2021 1334   K 4.2 04/09/2014 1759   CL 108 02/16/2021 1334   CL 112 (H) 04/09/2014 1759   CO2 28 02/16/2021 1334   CO2 27 04/09/2014 1759   GLUCOSE 85 02/16/2021 1334   GLUCOSE 117 (H) 04/09/2014 1759   BUN 24 (H) 02/16/2021 1334   BUN 35 (H) 07/15/2019 1407   BUN 30 (H) 04/09/2014 1759   CREATININE 1.53 (H) 02/16/2021 1334   CREATININE 1.61 (H) 04/09/2014 1759   CALCIUM 8.5 (L) 02/16/2021 1334   CALCIUM 8.6 04/09/2014 1759   PROT 5.0 (L) 02/16/2021 1334   PROT 5.9 (L) 07/15/2019 1407   PROT 6.8 04/09/2014 1759   ALBUMIN 2.9 (L) 02/16/2021 1334   ALBUMIN 4.0 07/15/2019 1407   ALBUMIN 3.5 04/09/2014 1759   AST 17 02/16/2021 1334   AST 34 04/09/2014 1759   ALT 14 02/16/2021 1334   ALT 35 04/09/2014 1759   ALKPHOS 41 02/16/2021 1334   ALKPHOS 62 04/09/2014 1759   BILITOT 0.3 02/16/2021 1334   BILITOT 0.3 07/15/2019 1407   BILITOT 0.3 04/09/2014 1759   GFRNONAA 43 (L) 02/16/2021 1334   GFRNONAA 39 (L) 04/09/2014 1759   GFRAA 47 (L) 07/15/2019 1407   GFRAA 45 (L) 04/09/2014 1759   Lab Results  Component Value Date   CHOL 114 03/22/2014   HDL 38 (L) 03/22/2014   LDLCALC 61 03/22/2014   TRIG 76 03/22/2014   No results found for: HGBA1C No results found for: VITAMINB12 Lab Results  Component Value Date   TSH 4.69 (H) 03/22/2014    ASSESSMENT AND PLAN 88 y.o. year old male  has a past medical history of Alzheimer disease ([place]) (07/31/2018), Angina pectoris ([place]), Arthritis, Cardiomyopathy ([place]), COPD (chronic obstructive pulmonary disease) ([place]), Gastroesophageal reflux disease, History of colon polyps, Hyperlipidemia, Hypertension, Obesity, Osteoporosis, Polymyalgia rheumatica ([place]), Prostate cancer ([place]), Rheumatoid arthritis ([place]), Secondary Parkinson disease ([place]) (08/13/2019), and Sleep apnea. here with:  Alzheimer's disease and parkinsonism secondary to Alzheimer's disease.  -Had side effects to Risperdal and Depakote, continue Seroquel and Namenda.  If needed may increase Seroquel in the future.  -Impaired cognition, advanced dementia, last MMSE in 2020 was 12 out of 30, today could not complete, we will not perform MMSE's in the future.  -Answered all questions, discussed history with wife and daughter.  Extended visit.  Sarina Ill, MD [facility] [address] [place], [place] [address] [phone]  I spent over 100 minutes of face-to-face and non-face-to-face time with patient on the  1. Dementia with behavioral disturbance    diagnosis.  This included previsit chart review, lab review, study review, order entry, electronic health record documentation, patient education on the different diagnostic and therapeutic options, counseling and coordination of care, risks and benefits of management, compliance, or risk factor reduction

## 2021-12-11 ENCOUNTER — Encounter (HOSPITAL_BASED_OUTPATIENT_CLINIC_OR_DEPARTMENT_OTHER): Payer: Medicare Other | Admitting: General Surgery

## 2021-12-14 ENCOUNTER — Other Ambulatory Visit: Payer: Self-pay | Admitting: Neurology

## 2021-12-15 ENCOUNTER — Encounter (HOSPITAL_BASED_OUTPATIENT_CLINIC_OR_DEPARTMENT_OTHER): Payer: Medicare Other | Admitting: Internal Medicine

## 2021-12-19 ENCOUNTER — Other Ambulatory Visit: Payer: Self-pay

## 2021-12-19 ENCOUNTER — Encounter (HOSPITAL_BASED_OUTPATIENT_CLINIC_OR_DEPARTMENT_OTHER): Payer: Medicare Other | Attending: General Surgery | Admitting: General Surgery

## 2021-12-19 DIAGNOSIS — L98412 Non-pressure chronic ulcer of buttock with fat layer exposed: Secondary | ICD-10-CM | POA: Diagnosis not present

## 2021-12-19 DIAGNOSIS — N183 Chronic kidney disease, stage 3 unspecified: Secondary | ICD-10-CM | POA: Insufficient documentation

## 2021-12-19 DIAGNOSIS — L89322 Pressure ulcer of left buttock, stage 2: Secondary | ICD-10-CM | POA: Insufficient documentation

## 2021-12-19 DIAGNOSIS — I10 Essential (primary) hypertension: Secondary | ICD-10-CM | POA: Diagnosis not present

## 2021-12-19 DIAGNOSIS — I129 Hypertensive chronic kidney disease with stage 1 through stage 4 chronic kidney disease, or unspecified chronic kidney disease: Secondary | ICD-10-CM | POA: Insufficient documentation

## 2021-12-19 DIAGNOSIS — J449 Chronic obstructive pulmonary disease, unspecified: Secondary | ICD-10-CM | POA: Insufficient documentation

## 2021-12-19 DIAGNOSIS — F0280 Dementia in other diseases classified elsewhere without behavioral disturbance: Secondary | ICD-10-CM | POA: Insufficient documentation

## 2021-12-19 DIAGNOSIS — G309 Alzheimer's disease, unspecified: Secondary | ICD-10-CM | POA: Diagnosis not present

## 2021-12-19 DIAGNOSIS — M069 Rheumatoid arthritis, unspecified: Secondary | ICD-10-CM | POA: Insufficient documentation

## 2021-12-19 NOTE — Progress Notes (Signed)
LOPEZ, DENTINGER ([id number]) ?Visit Report for 12/19/2021 ?Abuse Risk Screen Details ?Patient Name: Date of Service: ?[name], [name] 12/19/2021 1:15 PM ?Medical Record Number: [id number] ?Patient Account Number: [id number] ?Date of Birth/Sex: Treating RN: ?03-Sep-1928 (88 y.o. Devin Vance) Dellie Catholic ?Primary Care Khyran Riera: Wenda Low Other Clinician: ?Referring Neil Brickell: ?Treating Gilad Dugger/Extender: Fredirick Maudlin ?Wenda Low ?Weeks in Treatment: 0 ?Abuse Risk Screen Items ?Answer ?ABUSE RISK SCREEN: ?Has anyone close to you tried to hurt or harm you recentlyo No ?Do you feel uncomfortable with anyone in your familyo No ?Has anyone forced you do things that you didnt want to doo No ?Electronic Signature(s) ?Signed: 12/19/2021 6:27:50 PM By: Dellie Catholic RN ?Entered By: Dellie Catholic on 12/19/2021 13:49:41 ?-------------------------------------------------------------------------------- ?Activities of Daily Living Details ?Patient Name: Date of Service: ?[name], [name] 12/19/2021 1:15 PM ?Medical Record Number: [id number] ?Patient Account Number: [id number] ?Date of Birth/Sex: Treating RN: ?03/05/1928 (88 y.o. Devin Vance) Dellie Catholic ?Primary Care Zakiah Beckerman: Wenda Low Other Clinician: ?Referring Aditi Rovira: ?Treating Aurel Nguyen/Extender: Fredirick Maudlin ?Wenda Low ?Weeks in Treatment: 0 ?Activities of Daily Living Items ?Answer ?Activities of Daily Living (Please select one for each item) ?Drive Automobile Not Able ?T Medications ?ake Completely Able ?Use T elephone Need Assistance ?Care for Appearance Need Assistance ?Use T oilet Need Assistance ?Bath / Shower Need Assistance ?Dress Self Need Assistance ?Feed Self Completely Able ?Walk Need Assistance ?Get In / Out Bed Need Assistance ?Housework Not Able ?Prepare Meals Not Able ?Handle Money Not Able ?Shop for Self Need Assistance ?Electronic Signature(s) ?Signed: 12/19/2021 6:27:50 PM By: Dellie Catholic RN ?Entered By: Dellie Catholic on 12/19/2021  13:51:05 ?-------------------------------------------------------------------------------- ?Education Screening Details ?Patient Name: ?Date of Service: ?[name], [name] 12/19/2021 1:15 PM ?Medical Record Number: [id number] ?Patient Account Number: [id number] ?Date of Birth/Sex: ?Treating RN: ?06-04-1928 (88 y.o. Devin Vance) Dellie Catholic ?Primary Care Isac Lincks: Wenda Low ?Other Clinician: ?Referring Dayzha Pogosyan: ?Treating Scarlettrose Costilow/Extender: Fredirick Maudlin ?Wenda Low ?Weeks in Treatment: 0 ?Learning Preferences/Education Level/Primary Language ?Learning Preference: Explanation, Demonstration, Printed Material ?Highest Education Level: College or Above ?Preferred Language: English ?Cognitive Barrier ?Language Barrier: No ?Translator Needed: No ?Memory Deficit: No ?Emotional Barrier: No ?Cultural/Religious Beliefs Affecting Medical Care: No ?Physical Barrier ?Impaired Vision: No ?Impaired Hearing: No ?Decreased Hand dexterity: No ?Knowledge/Comprehension ?Knowledge Level: High ?Comprehension Level: High ?Ability to understand written instructions: High ?Ability to understand verbal instructions: High ?Motivation ?Anxiety Level: Calm ?Cooperation: Cooperative ?Education Importance: Acknowledges Need ?Interest in Health Problems: Asks Questions ?Perception: Coherent ?Willingness to Engage in Self-Management High ?Activities: ?Readiness to Engage in Self-Management High ?Activities: ?Electronic Signature(s) ?Signed: 12/19/2021 6:27:50 PM By: Dellie Catholic RN ?Entered By: Dellie Catholic on 12/19/2021 13:53:44 ?-------------------------------------------------------------------------------- ?Fall Risk Assessment Details ?Patient Name: ?Date of Service: ?[name], [name] 12/19/2021 1:15 PM ?Medical Record Number: [id number] ?Patient Account Number: [id number] ?Date of Birth/Sex: ?Treating RN: ?Nov 15, 1927 (88 y.o. Devin Vance) Dellie Catholic ?Primary Care Courvoisier Hamblen: Wenda Low ?Other Clinician: ?Referring Reagen Haberman: ?Treating  Jeronda Don/Extender: Fredirick Maudlin ?Wenda Low ?Weeks in Treatment: 0 ?Fall Risk Assessment Items ?Have you had 2 or more falls in the last 12 monthso 0 No ?Have you had any fall that resulted in injury in the last 12 monthso 0 No ?FALLS RISK SCREEN ?History of falling - immediate or within 3 months 0 No ?Secondary diagnosis (Do you have 2 or more medical diagnoseso) 15 Yes ?Ambulatory aid ?None/bed rest/wheelchair/nurse 0 No ?Crutches/cane/walker 0 No ?Furniture 30 Yes ?Intravenous therapy Access/Saline/Heparin Lock 0 No ?Gait/Transferring ?Normal/ bed rest/ wheelchair 0 No ?Weak (short steps with or without  shuffle, stooped but able to lift head while walking, may seek 0 No ?support from furniture) ?Impaired (short steps with shuffle, may have difficulty arising from chair, head down, impaired 20 Yes ?balance) ?Mental Status ?Oriented to own ability 0 No ?Electronic Signature(s) ?Signed: 12/19/2021 6:27:50 PM By: Dellie Catholic RN ?Entered By: Dellie Catholic on 12/19/2021 13:59:46 ?-------------------------------------------------------------------------------- ?Foot Assessment Details ?Patient Name: ?Date of Service: ?[name], [name] 12/19/2021 1:15 PM ?Medical Record Number: [id number] ?Patient Account Number: [id number] ?Date of Birth/Sex: ?Treating RN: ?1928/02/14 (88 y.o. Devin Vance) Dellie Catholic ?Primary Care Cherita Hebel: Wenda Low ?Other Clinician: ?Referring Arma Reining: ?Treating Denya Buckingham/Extender: Fredirick Maudlin ?Wenda Low ?Weeks in Treatment: 0 ?Foot Assessment Items ?Site Locations ?+ = Sensation present, - = Sensation absent, C = Callus, U = Ulcer ?R = Redness, W = Warmth, M = Maceration, PU = Pre-ulcerative lesion ?F = Fissure, S = Swelling, D = Dryness ?Assessment ?Right: Left: ?Other Deformity: No No ?Prior Foot Ulcer: No No ?Prior Amputation: No No ?Charcot Joint: No No ?Ambulatory Status: Ambulatory With Help ?Assistance Device: Walker ?Gait: Unsteady ?Electronic Signature(s) ?Signed: 12/19/2021  6:27:50 PM By: Dellie Catholic RN ?Entered By: Dellie Catholic on 12/19/2021 14:00:30 ?-------------------------------------------------------------------------------- ?Nutrition Risk Screening Details ?Patient Name: ?Date of Service: ?[name], [name] 12/19/2021 1:15 PM ?Medical Record Number: [id number] ?Patient Account Number: [id number] ?Date of Birth/Sex: ?Treating RN: ?August 07, 1928 (88 y.o. Devin Vance) Dellie Catholic ?Primary Care Deyonte Cadden: Wenda Low ?Other Clinician: ?Referring Emmanuela Ghazi: ?Treating Lazara Grieser/Extender: Fredirick Maudlin ?Wenda Low ?Weeks in Treatment: 0 ?Height (in): 70 ?Weight (lbs): 180 ?Body Mass Index (BMI): 25.8 ?Nutrition Risk Screening Items ?Score Screening ?NUTRITION RISK SCREEN: ?I have an illness or condition that made me change the kind and/or amount of food I eat 0 No ?I eat fewer than two meals per day 0 No ?I eat few fruits and vegetables, or milk products 0 No ?I have three or more drinks of beer, liquor or wine almost every day 0 No ?I have tooth or mouth problems that make it hard for me to eat 0 No ?I don't always have enough money to buy the food I need 0 No ?I eat alone most of the time 0 No ?I take three or more different prescribed or over-the-counter drugs a day 0 No ?Without wanting to, I have lost or gained 10 pounds in the last six months 0 No ?I am not always physically able to shop, cook and/or feed myself 0 No ?Nutrition Protocols ?Good Risk Protocol 0 No interventions needed ?Moderate Risk Protocol ?High Risk Proctocol ?Risk Level: Good Risk ?Score: 0 ?Electronic Signature(s) ?Signed: 12/19/2021 6:27:50 PM By: Dellie Catholic RN ?Entered By: Dellie Catholic on 12/19/2021 13:59:59 ?

## 2021-12-19 NOTE — Progress Notes (Signed)
TAVON, MAGNUSSEN ([id number]) Visit Report for 12/19/2021 Allergy List Details Patient Name: Date of Service: Devin Vance [facility] S. 12/19/2021 1:15 PM Medical Record Number: [id number] Patient Account Number: [id number] Date of Birth/Sex: Treating RN: [birth year] (88 y.o. Collene Gobble Primary Care Raysa Bosak: Wenda Low Other Clinician: Referring Dontravious Camille: Treating Jiles Goya/Extender: Corliss Marcus Weeks in Treatment: 0 Allergies Active Allergies lorazepam Reaction: unknown Allergy Notes Electronic Signature(s) Signed: 12/19/2021 6:27:50 PM By: Dellie Catholic RN Entered By: Dellie Catholic on 12/19/2021 13:48:19 -------------------------------------------------------------------------------- Arrival Information Details Patient Name: Date of Service: Devin Vance, [name] 12/19/2021 1:15 PM Medical Record Number: [id number] Patient Account Number: [id number] Date of Birth/Sex: Treating RN: 1928-04-27 (88 y.o. Collene Gobble Primary Care Lakyia Behe: Wenda Low Other Clinician: Referring Soniyah Mcglory: Treating Zamora Colton/Extender: Celine Ahr in Treatment: 0 Visit Information Patient Arrived: Ambulatory Arrival Time: 13:43 Accompanied By: wife Transfer Assistance: Manual Patient Identification Verified: Yes History Since Last Visit Added or deleted any medications: No Any new allergies or adverse reactions: No Had a fall or experienced change in activities of daily living that may affect risk of falls: No Signs or symptoms of abuse/neglect since last visito No Hospitalized since last visit: No Implantable device outside of the clinic excluding cellular tissue based products placed in the center since last visit: No Electronic Signature(s) Signed: 12/19/2021 6:27:50 PM By: Dellie Catholic RN Entered By: Dellie Catholic on 12/19/2021 13:44:58 -------------------------------------------------------------------------------- Clinic Level of Care  Assessment Details Patient Name: Date of Service: Devin Vance [facility] S. 12/19/2021 1:15 PM Medical Record Number: [id number] Patient Account Number: [id number] Date of Birth/Sex: Treating RN: 09/21/1928 (88 y.o. Collene Gobble Primary Care Liah Morr: Wenda Low Other Clinician: Referring Shanira Tine: Treating Katherleen Folkes/Extender: Celine Ahr in Treatment: 0 Clinic Level of Care Assessment Items TOOL 2 Quantity Score X- 1 0 Use when only an EandM is performed on the INITIAL visit ASSESSMENTS - Nursing Assessment / Reassessment X- 1 20 General Physical Exam (combine w/ comprehensive assessment (listed just below) when performed on new pt. evals) X- 1 25 Comprehensive Assessment (HX, ROS, Risk Assessments, Wounds Hx, etc.) ASSESSMENTS - Wound and Skin A ssessment / Reassessment X - Simple Wound Assessment / Reassessment - one wound 1 5 '[]'$  - 0 Complex Wound Assessment / Reassessment - multiple wounds '[]'$  - 0 Dermatologic / Skin Assessment (not related to wound area) ASSESSMENTS - Ostomy and/or Continence Assessment and Care '[]'$  - 0 Incontinence Assessment and Management '[]'$  - 0 Ostomy Care Assessment and Management (repouching, etc.) PROCESS - Coordination of Care X - Simple Patient / Family Education for ongoing care 1 15 '[]'$  - 0 Complex (extensive) Patient / Family Education for ongoing care X- 1 10 Staff obtains Programmer, systems, Records, T Results / Process Orders est X- 1 10 Staff telephones HHA, Nursing Homes / Clarify orders / etc '[]'$  - 0 Routine Transfer to another Facility (non-emergent condition) '[]'$  - 0 Routine Hospital Admission (non-emergent condition) X- 1 15 New Admissions / Biomedical engineer / Ordering NPWT Apligraf, etc. , '[]'$  - 0 Emergency Hospital Admission (emergent condition) X- 1 10 Simple Discharge Coordination '[]'$  - 0 Complex (extensive) Discharge Coordination PROCESS - Special Needs '[]'$  - 0 Pediatric / Minor Patient Management '[]'$  -  0 Isolation Patient Management '[]'$  - 0 Hearing / Language / Visual special needs '[]'$  - 0 Assessment of Community assistance (transportation, D/C planning, etc.) '[]'$  - 0 Additional assistance / Altered mentation '[]'$  - 0 Support Surface(s) Assessment (bed, cushion, seat, etc.)  INTERVENTIONS - Wound Cleansing / Measurement X- 1 5 Wound Imaging (photographs - any number of wounds) '[]'$  - 0 Wound Tracing (instead of photographs) X- 1 5 Simple Wound Measurement - one wound '[]'$  - 0 Complex Wound Measurement - multiple wounds X- 1 5 Simple Wound Cleansing - one wound '[]'$  - 0 Complex Wound Cleansing - multiple wounds INTERVENTIONS - Wound Dressings X - Small Wound Dressing one or multiple wounds 1 10 '[]'$  - 0 Medium Wound Dressing one or multiple wounds '[]'$  - 0 Large Wound Dressing one or multiple wounds '[]'$  - 0 Application of Medications - injection INTERVENTIONS - Miscellaneous '[]'$  - 0 External ear exam '[]'$  - 0 Specimen Collection (cultures, biopsies, blood, body fluids, etc.) '[]'$  - 0 Specimen(s) / Culture(s) sent or taken to Lab for analysis '[]'$  - 0 Patient Transfer (multiple staff / Civil Service fast streamer / Similar devices) '[]'$  - 0 Simple Staple / Suture removal (25 or less) '[]'$  - 0 Complex Staple / Suture removal (26 or more) '[]'$  - 0 Hypo / Hyperglycemic Management (close monitor of Blood Glucose) '[]'$  - 0 Ankle / Brachial Index (ABI) - do not check if billed separately Has the patient been seen at the hospital within the last three years: Yes Total Score: 135 Level Of Care: New/Established - Level 4 Electronic Signature(s) Signed: 12/19/2021 6:27:50 PM By: Dellie Catholic RN Entered By: Dellie Catholic on 12/19/2021 18:24:35 -------------------------------------------------------------------------------- Encounter Discharge Information Details Patient Name: Date of Service: Devin Vance, [name] 12/19/2021 1:15 PM Medical Record Number: [id number] Patient Account Number: [id number] Date of Birth/Sex:  Treating RN: 07-31-1928 (88 y.o. Collene Gobble Primary Care Jill Stopka: Wenda Low Other Clinician: Referring Jacorian Golaszewski: Treating Edyn Popoca/Extender: Celine Ahr in Treatment: 0 Encounter Discharge Information Items Discharge Condition: Stable Ambulatory Status: Ambulatory Discharge Destination: Home Transportation: Private Auto Accompanied By: spouse Schedule Follow-up Appointment: Yes Clinical Summary of Care: Patient Declined Electronic Signature(s) Signed: 12/19/2021 6:27:50 PM By: Dellie Catholic RN Entered By: Dellie Catholic on 12/19/2021 18:27:24 -------------------------------------------------------------------------------- Lower Extremity Assessment Details Patient Name: Date of Service: Devin Vance [facility] S. 12/19/2021 1:15 PM Medical Record Number: [id number] Patient Account Number: [id number] Date of Birth/Sex: Treating RN: 1928/04/05 (88 y.o. Collene Gobble Primary Care Amena Dockham: Wenda Low Other Clinician: Referring Carla Whilden: Treating Alegria Dominique/Extender: Corliss Marcus Weeks in Treatment: 0 Electronic Signature(s) Signed: 12/19/2021 6:27:50 PM By: Dellie Catholic RN Entered By: Dellie Catholic on 12/19/2021 14:00:39 -------------------------------------------------------------------------------- Multi Wound Chart Details Patient Name: Date of Service: Devin Vance, [name] 12/19/2021 1:15 PM Medical Record Number: [id number] Patient Account Number: [id number] Date of Birth/Sex: Treating RN: [birth year] (89 y.o. M) Primary Care Chena Chohan: Wenda Low Other Clinician: Referring Tamarick Kovalcik: Treating Ahna Konkle/Extender: Celine Ahr in Treatment: 0 Vital Signs Height(in): 54 Pulse(bpm): 54 Weight(lbs): 180 Blood Pressure(mmHg): 123/86 Body Mass Index(BMI): 25.8 Temperature(F): 98.1 Respiratory Rate(breaths/min): 18 Photos: [1:No Photos Left, Medial Gluteus] [N/A:N/A N/A] Wound Location:  [1:Trauma] [N/A:N/A] Wounding Event: [1:Pressure Ulcer] [N/A:N/A] Primary Etiology: [1:Cataracts, Chronic Obstructive] [N/A:N/A] Comorbid History: [1:Pulmonary Disease (COPD), Sleep Apnea, Angina, Coronary Artery Disease, Hypertension, Rheumatoid Arthritis, Dementia 12/18/2021] [N/A:N/A] Date Acquired: [1:0] [N/A:N/A] Weeks of Treatment: [1:Open] [N/A:N/A] Wound Status: [1:No] [N/A:N/A] Wound Recurrence: [1:Yes] [N/A:N/A] Clustered Wound: [1:1x1.5x0.1] [N/A:N/A] Measurements L x W x D (cm) [1:1.178] [N/A:N/A] A (cm) : rea [1:0.118] [N/A:N/A] Volume (cm) : [1:Category/Stage II] [N/A:N/A] Classification: [1:Medium] [N/A:N/A] Exudate A mount: [1:Serosanguineous] [N/A:N/A] Exudate Type: [1:red, brown] [N/A:N/A] Exudate Color: [1:Flat and Intact] [N/A:N/A] Wound Margin: [1:Medium (34-66%)] [N/A:N/A] Granulation A mount: [  1:Red, Pink] [N/A:N/A] Granulation Quality: [1:Medium (34-66%)] [N/A:N/A] Necrotic A mount: [1:Fat Layer (Subcutaneous Tissue): Yes N/A] Exposed Structures: [1:Fascia: No Tendon: No Muscle: No Joint: No Bone: No None] [N/A:N/A] Treatment Notes Electronic Signature(s) Signed: 12/19/2021 2:22:51 PM By: Fredirick Maudlin MD FACS Entered By: Fredirick Maudlin on 12/19/2021 14:22:51 -------------------------------------------------------------------------------- Multi-Disciplinary Care Plan Details Patient Name: Date of Service: Devin Vance, [name] 12/19/2021 1:15 PM Medical Record Number: [id number] Patient Account Number: [id number] Date of Birth/Sex: Treating RN: Apr 16, 1928 (88 y.o. Collene Gobble Primary Care Aloni Chuang: Wenda Low Other Clinician: Referring Ethylene Reznick: Treating Delmer Kowalski/Extender: Celine Ahr in Treatment: 0 Active Inactive Pain, Acute or Chronic Nursing Diagnoses: Pain, acute or chronic: actual or potential Goals: Patient/caregiver will verbalize adequate pain control between visits Date Initiated: 12/19/2021 Target  Resolution Date: 01/19/2022 Goal Status: Active Interventions: Encourage patient to take pain medications as prescribed Notes: Electronic Signature(s) Signed: 12/19/2021 6:27:50 PM By: Dellie Catholic RN Entered By: Dellie Catholic on 12/19/2021 18:21:36 -------------------------------------------------------------------------------- Pain Assessment Details Patient Name: Date of Service: Devin Vance [facility] S. 12/19/2021 1:15 PM Medical Record Number: [id number] Patient Account Number: [id number] Date of Birth/Sex: Treating RN: [birth year] (88 y.o. Collene Gobble Primary Care Renee Erb: Wenda Low Other Clinician: Referring Curly Mackowski: Treating Ashleyanne Hemmingway/Extender: Celine Ahr in Treatment: 0 Active Problems Location of Pain Severity and Description of Pain Patient Has Paino No Site Locations Pain Management and Medication Current Pain Management: Electronic Signature(s) Signed: 12/19/2021 6:27:50 PM By: Dellie Catholic RN Entered By: Dellie Catholic on 12/19/2021 14:02:47 -------------------------------------------------------------------------------- Patient/Caregiver Education Details Patient Name: Date of Service: Devin Vance [facility] S. 3/7/2023andnbsp1:15 PM Medical Record Number: [id number] Patient Account Number: [id number] Date of Birth/Gender: Treating RN: January 26, 1928 (88 y.o. Collene Gobble Primary Care Physician: Wenda Low Other Clinician: Referring Physician: Treating Physician/Extender: Celine Ahr in Treatment: 0 Education Assessment Education Provided To: Patient Education Topics Provided Pain: Methods: Explain/Verbal Responses: Return demonstration correctly Electronic Signature(s) Signed: 12/19/2021 6:27:50 PM By: Dellie Catholic RN Entered By: Dellie Catholic on 12/19/2021 18:21:50 -------------------------------------------------------------------------------- Wound Assessment Details Patient Name: Date  of Service: Devin Vance [facility] S. 12/19/2021 1:15 PM Medical Record Number: [id number] Patient Account Number: [id number] Date of Birth/Sex: Treating RN: June 12, 1928 (88 y.o. Collene Gobble Primary Care Murry Diaz: Wenda Low Other Clinician: Referring Leylani Duley: Treating Roquel Burgin/Extender: Corliss Marcus Weeks in Treatment: 0 Wound Status Wound Number: 1 Primary Pressure Ulcer Etiology: Wound Location: Left, Medial Gluteus Wound Open Wounding Event: Trauma Status: Date Acquired: 12/18/2021 Comorbid Cataracts, Chronic Obstructive Pulmonary Disease (COPD), Sleep Weeks Of Treatment: 0 History: Apnea, Angina, Coronary Artery Disease, Hypertension, Clustered Wound: Yes Rheumatoid Arthritis, Dementia Wound Measurements Length: (cm) 1 Width: (cm) 1.5 Depth: (cm) 0.1 Area: (cm) 1.178 Volume: (cm) 0.118 % Reduction in Area: % Reduction in Volume: Epithelialization: None Tunneling: No Undermining: No Wound Description Classification: Category/Stage II Wound Margin: Flat and Intact Exudate Amount: Medium Exudate Type: Serosanguineous Exudate Color: red, brown Foul Odor After Cleansing: No Slough/Fibrino Yes Wound Bed Granulation Amount: Medium (34-66%) Exposed Structure Granulation Quality: Red, Pink Fascia Exposed: No Necrotic Amount: Medium (34-66%) Fat Layer (Subcutaneous Tissue) Exposed: Yes Necrotic Quality: Adherent Slough Tendon Exposed: No Muscle Exposed: No Joint Exposed: No Bone Exposed: No Treatment Notes Wound #1 (Gluteus) Wound Laterality: Left, Medial Cleanser Soap and Water Discharge Instruction: May shower and wash wound with dial antibacterial soap and water prior to dressing change. Wound Cleanser Discharge Instruction: Cleanse the wound with wound cleanser prior to applying a clean dressing  using gauze sponges, not tissue or cotton balls. Peri-Wound Care Zinc Oxide Ointment 30g tube Discharge Instruction: Apply Zinc Oxide to wound  with each dressing change Topical Primary Dressing Secondary Dressing Bordered Gauze, 2x2 in Discharge Instruction: Apply over primary dressing as directed. Secured With Paper Tape, 2x10 (in/yd) Discharge Instruction: Secure dressing with tape as directed. Compression Wrap Compression Stockings Add-Ons Electronic Signature(s) Signed: 12/19/2021 6:27:50 PM By: Dellie Catholic RN Entered By: Dellie Catholic on 12/19/2021 14:02:19 -------------------------------------------------------------------------------- Vitals Details Patient Name: Date of Service: Devin Vance, [name] 12/19/2021 1:15 PM Medical Record Number: [id number] Patient Account Number: [id number] Date of Birth/Sex: Treating RN: 01-12-1928 (88 y.o. Collene Gobble Primary Care Brax Walen: Wenda Low Other Clinician: Referring Leniya Breit: Treating Treasure Ingrum/Extender: Celine Ahr in Treatment: 0 Vital Signs Time Taken: 13:46 Temperature (F): 98.1 Height (in): 70 Pulse (bpm): 66 Source: Stated Respiratory Rate (breaths/min): 18 Weight (lbs): 180 Blood Pressure (mmHg): 123/86 Source: Stated Reference Range: 80 - 120 mg / dl Body Mass Index (BMI): 25.8 Electronic Signature(s) Signed: 12/19/2021 6:27:50 PM By: Dellie Catholic RN Entered By: Dellie Catholic on 12/19/2021 13:47:38

## 2021-12-20 NOTE — Progress Notes (Signed)
[name], [name] ([id number]) Visit Report for 12/19/2021 Chief Complaint Document Details Patient Name: Date of Service: Devin Vance [facility] S. 12/19/2021 1:15 PM Medical Record Number: [id number] Patient Account Number: [id number] Date of Birth/Sex: Treating RN: 1928-08-25 (88 y.o. M) Primary Care Provider: Wenda Low Other Clinician: Referring Provider: Treating Provider/Extender: Celine Ahr in Treatment: 0 Information Obtained from: Patient Chief Complaint Buttocks wounds that occurred 1 month ago that have closed and would like further advice on preventative measures; 12/20/21: recent recurrence of buttock wound on left. Electronic Signature(s) Signed: 12/19/2021 2:29:06 PM By: Fredirick Maudlin MD FACS Entered By: Fredirick Maudlin on 12/19/2021 14:29:06 -------------------------------------------------------------------------------- HPI Details Patient Name: Date of Service: Devin Vance, [name] 12/19/2021 1:15 PM Medical Record Number: [id number] Patient Account Number: [id number] Date of Birth/Sex: Treating RN: 08-23-1928 (88 y.o. M) Primary Care Provider: Wenda Low Other Clinician: Referring Provider: Treating Provider/Extender: Celine Ahr in Treatment: 0 History of Present Illness HPI Description: Devin Vance is a 88 year old male with a past medical history of Alzheimer's, stage III kidney disease And rheumatoid arthritis that presents to the clinic for pressure sores on his buttocks that have closed. The patient had wounds limited to skin breakdown that started about 1 month ago. Wife and daughter are present And provide most of the history. His wife states that he will have episodes of diarrhea that cause the skin to breakdown. He was seen by urgent care on 4/22 for these wounds and was prescribed doxycycline for cellulitis. He started using Balmex daily and this helped with wound healing. Over the course of the last few weeks  his wounds have improved and currently has no open wounds. He sleeps in a recliner at night and uses a cushion called PURAP. He is mobile with a walker. He currently denies signs of infection. Readmission 12/20/2021 Devin Vance is now 88 years old. Past medical history notable for Alzheimer's disease, stage III kidney disease, rheumatoid arthritis, hypertension, and COPD. Recently, an area on his left buttock reopened. It is stage II. There is surrounding dry skin. They have been applying Balmex. It had nearly closed but he had a small area of excoriation that his wife applied a Band-Aid to. When she removed this, the wound reopened. No significant pain in the area. No concern for infection. His wife, however, seems quite anxious about the wound and desires information about preventing further episodes. Electronic Signature(s) Signed: 12/19/2021 2:31:30 PM By: Fredirick Maudlin MD FACS Entered By: Fredirick Maudlin on 12/19/2021 14:31:30 -------------------------------------------------------------------------------- Physical Exam Details Patient Name: Date of Service: Devin Vance LPH S. 12/19/2021 1:15 PM Medical Record Number: [id number] Patient Account Number: [id number] Date of Birth/Sex: Treating RN: 01/11/1928 (88 y.o. M) Primary Care Provider: Wenda Low Other Clinician: Referring Provider: Treating Provider/Extender: Jolaine Artist, Karrar Weeks in Treatment: 0 Constitutional . . . . No acute distress. Respiratory Normal work of breathing on room air.. Notes 12/20/2021: Wound examthere is an area on the left buttock near the midline that has a small area of open skin. No surrounding erythema, induration, or drainage. The skin surrounding the site is dry and a bit flaky. There is an area of flaky skin on the opposite buttock where the 2 areas could potentially be rubbing. Electronic Signature(s) Signed: 12/19/2021 2:32:49 PM By: Fredirick Maudlin MD FACS Entered By: Fredirick Maudlin  on 12/19/2021 14:32:49 -------------------------------------------------------------------------------- Physician Orders Details Patient Name: Date of Service: [name], [name] 12/19/2021 1:15 PM Medical Record Number:  [id number] Patient Account Number: [id number] Date of Birth/Sex: Treating RN: Jan 17, 1928 (88 y.o. Collene Gobble Primary Care Provider: Wenda Low Other Clinician: Referring Provider: Treating Provider/Extender: Celine Ahr in Treatment: 0 Verbal / Phone Orders: No Diagnosis Coding ICD-10 Coding Code Description [phone] Pressure ulcer of left buttock, stage 2 G30.9 Alzheimer's disease, unspecified J44.9 Chronic obstructive pulmonary disease, unspecified N18.30 Chronic kidney disease, stage 3 unspecified I10 Essential (primary) hypertension Follow-up Appointments ppointment in 1 week. - Dr Celine Ahr Return A Bathing/ Shower/ Hygiene May shower and wash wound with soap and water. - Please place the Zinc oxide (Balmex) on the wound as needed Off-Loading Turn and reposition every 2 hours Other: - continue using your water cushion in the chair!! Non Wound Condition Protect area with: - Apply zinc oxide to bottom daily..may apply more than once a day, but if pt. starts to have open sores again, be sure to apply zinc oxide 3-4 times a day Wound Treatment Wound #1 - Gluteus Wound Laterality: Left, Medial Cleanser: Soap and Water 1 x Per Day/15 Days Discharge Instructions: May shower and wash wound with dial antibacterial soap and water prior to dressing change. Cleanser: Wound Cleanser 1 x Per Day/15 Days Discharge Instructions: Cleanse the wound with wound cleanser prior to applying a clean dressing using gauze sponges, not tissue or cotton balls. Peri-Wound Care: Zinc Oxide Ointment 30g tube 1 x Per Day/15 Days Discharge Instructions: Apply Zinc Oxide to wound with each dressing change Secondary Dressing: Bordered Gauze, 2x2 in 1 x Per  Day/15 Days Discharge Instructions: Apply over primary dressing as directed. Secured With: Paper Tape, 2x10 (in/yd) 1 x Per Day/15 Days Discharge Instructions: Secure dressing with tape as directed. Electronic Signature(s) Signed: 12/19/2021 5:26:37 PM By: Fredirick Maudlin MD FACS Entered By: Fredirick Maudlin on 12/19/2021 14:33:21 -------------------------------------------------------------------------------- Problem List Details Patient Name: Date of Service: Devin Vance, [name] 12/19/2021 1:15 PM Medical Record Number: [id number] Patient Account Number: [id number] Date of Birth/Sex: Treating RN: May 28, 1928 (88 y.o. M) Primary Care Provider: Wenda Low Other Clinician: Referring Provider: Treating Provider/Extender: Celine Ahr in Treatment: 0 Active Problems ICD-10 Encounter Code Description Active Date MDM Diagnosis [phone] Pressure ulcer of left buttock, stage 2 12/19/2021 No Yes G30.9 Alzheimer's disease, unspecified 12/19/2021 No Yes J44.9 Chronic obstructive pulmonary disease, unspecified 12/19/2021 No Yes N18.30 Chronic kidney disease, stage 3 unspecified 12/19/2021 No Yes I10 Essential (primary) hypertension 12/19/2021 No Yes Inactive Problems Resolved Problems Electronic Signature(s) Signed: 12/19/2021 2:22:32 PM By: Fredirick Maudlin MD FACS Previous Signature: 12/19/2021 2:22:01 PM Version By: Fredirick Maudlin MD FACS Entered By: Fredirick Maudlin on 12/19/2021 14:22:31 -------------------------------------------------------------------------------- Progress Note Details Patient Name: Date of Service: Devin Vance, [name] 12/19/2021 1:15 PM Medical Record Number: [id number] Patient Account Number: [id number] Date of Birth/Sex: Treating RN: 1928-07-20 (88 y.o. M) Primary Care Provider: Wenda Low Other Clinician: Referring Provider: Treating Provider/Extender: Celine Ahr in Treatment: 0 Subjective Chief Complaint Information  obtained from Patient Buttocks wounds that occurred 1 month ago that have closed and would like further advice on preventative measures; 12/20/21: recent recurrence of buttock wound on left. History of Present Illness (HPI) Mr. Devin Vance is a 88 year old male with a past medical history of Alzheimer's, stage III kidney disease And rheumatoid arthritis that presents to the clinic for pressure sores on his buttocks that have closed. The patient had wounds limited to skin breakdown that started about 1 month ago. Wife and daughter are present And provide most  of the history. His wife states that he will have episodes of diarrhea that cause the skin to breakdown. He was seen by urgent care on 4/22 for these wounds and was prescribed doxycycline for cellulitis. He started using Balmex daily and this helped with wound healing. Over the course of the last few weeks his wounds have improved and currently has no open wounds. He sleeps in a recliner at night and uses a cushion called PURAP. He is mobile with a walker. He currently denies signs of infection. Readmission 12/20/2021 Mr. Minder is now 88 years old. Past medical history notable for Alzheimer's disease, stage III kidney disease, rheumatoid arthritis, hypertension, and COPD. Recently, an area on his left buttock reopened. It is stage II. There is surrounding dry skin. They have been applying Balmex. It had nearly closed but he had a small area of excoriation that his wife applied a Band-Aid to. When she removed this, the wound reopened. No significant pain in the area. No concern for infection. His wife, however, seems quite anxious about the wound and desires information about preventing further episodes. Patient History Information obtained from Patient. Allergies lorazepam (Reaction: unknown) Family History Cancer - Father,Siblings, Stroke - Mother, No family history of Diabetes, Heart Disease, Hereditary Spherocytosis, Hypertension, Kidney  Disease, Lung Disease, Seizures, Thyroid Problems, Tuberculosis. Social History Never smoker, Marital Status - Married, Alcohol Use - Rarely, Drug Use - No History, Caffeine Use - Rarely. Medical History Eyes Patient has history of Cataracts - bil removed Denies history of Glaucoma, Optic Neuritis Ear/Nose/Mouth/Throat Denies history of Chronic sinus problems/congestion, Middle ear problems Respiratory Patient has history of Chronic Obstructive Pulmonary Disease (COPD), Sleep Apnea - no CPAP Cardiovascular Patient has history of Angina, Coronary Artery Disease, Hypertension Endocrine Denies history of Type I Diabetes, Type II Diabetes Genitourinary Denies history of End Stage Renal Disease Integumentary (Skin) Denies history of History of Burn Musculoskeletal Patient has history of Rheumatoid Arthritis Denies history of Osteoarthritis Neurologic Patient has history of Dementia - alzheimers Denies history of Neuropathy, Quadriplegia, Paraplegia, Seizure Disorder Oncologic Denies history of Received Chemotherapy, Received Radiation Psychiatric Denies history of Anorexia/bulimia, Confinement Anxiety Hospitalization/Surgery History - cardiac cath with stent. - prostatectomy. - cataract extraction. - strabismus surgery. - hemorrhoidectomy. - tonsillectomy. Medical A Surgical History Notes nd Cardiovascular cardiomyopathy, hyperlipidemia Gastrointestinal GERD Genitourinary CKD stage 3, incontinence Musculoskeletal kyphosis Oncologic prostate cancer Objective Constitutional No acute distress. Vitals Time Taken: 1:46 PM, Height: 70 in, Source: Stated, Weight: 180 lbs, Source: Stated, BMI: 25.8, Temperature: 98.1 F, Pulse: 66 bpm, Respiratory Rate: 18 breaths/min, Blood Pressure: 123/86 mmHg. Respiratory Normal work of breathing on room air.. General Notes: 12/20/2021: Wound examoothere is an area on the left buttock near the midline that has a small area of open skin. No  surrounding erythema, induration, or drainage. The skin surrounding the site is dry and a bit flaky. There is an area of flaky skin on the opposite buttock where the 2 areas could potentially be rubbing. Integumentary (Hair, Skin) Wound #1 status is Open. Original cause of wound was Trauma. The date acquired was: 12/18/2021. The wound is located on the Left,Medial Gluteus. The wound measures 1cm length x 1.5cm width x 0.1cm depth; 1.178cm^2 area and 0.118cm^3 volume. There is Fat Layer (Subcutaneous Tissue) exposed. There is no tunneling or undermining noted. There is a medium amount of serosanguineous drainage noted. The wound margin is flat and intact. There is medium (34-66%) red, pink granulation within the wound bed. There is a medium (34-66%)  amount of necrotic tissue within the wound bed including Adherent Slough. Assessment Active Problems ICD-10 Pressure ulcer of left buttock, stage 2 Alzheimer's disease, unspecified Chronic obstructive pulmonary disease, unspecified Chronic kidney disease, stage 3 unspecified Essential (primary) hypertension Plan Follow-up Appointments: Return Appointment in 1 week. - Dr Celine Ahr Bathing/ Shower/ Hygiene: May shower and wash wound with soap and water. - Please place the Zinc oxide (Balmex) on the wound as needed Off-Loading: Turn and reposition every 2 hours Other: - continue using your water cushion in the chair!! Non Wound Condition: Protect area with: - Apply zinc oxide to bottom daily..may apply more than once a day, but if pt. starts to have open sores again, be sure to apply zinc oxide 3- 4 times a day WOUND #1: - Gluteus Wound Laterality: Left, Medial Cleanser: Soap and Water 1 x Per Day/15 Days Discharge Instructions: May shower and wash wound with dial antibacterial soap and water prior to dressing change. Cleanser: Wound Cleanser 1 x Per Day/15 Days Discharge Instructions: Cleanse the wound with wound cleanser prior to applying a clean  dressing using gauze sponges, not tissue or cotton balls. Peri-Wound Care: Zinc Oxide Ointment 30g tube 1 x Per Day/15 Days Discharge Instructions: Apply Zinc Oxide to wound with each dressing change Secondary Dressing: Bordered Gauze, 2x2 in 1 x Per Day/15 Days Discharge Instructions: Apply over primary dressing as directed. Secured With: Paper T ape, 2x10 (in/yd) 1 x Per Day/15 Days Discharge Instructions: Secure dressing with tape as directed. 12/20/2021: There is an area on the left buttock near the midline that has a small area of open skin. No surrounding erythema, induration, or drainage. The skin surrounding the site is dry and a bit flaky. There is an area of flaky skin on the opposite buttock where the 2 areas could potentially be rubbing. No debridement required. His wife is quite anxious. She was reassured that continuing to keep the area well moisturized with Balmex and protecting the site from pressure by means of offloading and shifting his weight frequently should result in healing, as it has in the past. She expresses concern about potential recurrences. Given the patient's advanced age and other medical comorbidities, there is certainly the strong possibility that this will happen again in the future. I offered support for her and her daughters efforts to keep the area well lubricated. This should help with the flaking skin. They also have a wound cleanser that they purchased from [employer]. It looks similar to Vashe, but is a different brand. They were encouraged to continue using this as needed to clean the wound, particularly when there are episodes of soiling. He will follow-up in 1 week. Electronic Signature(s) Signed: 12/19/2021 2:35:51 PM By: Fredirick Maudlin MD FACS Entered By: Fredirick Maudlin on 12/19/2021 14:35:51 -------------------------------------------------------------------------------- HxROS Details Patient Name: Date of Service: [name], [name] 12/19/2021 1:15  PM Medical Record Number: [id number] Patient Account Number: [id number] Date of Birth/Sex: Treating RN: [birth year] (88 y.o. Collene Gobble Primary Care Provider: Wenda Low Other Clinician: Referring Provider: Treating Provider/Extender: Celine Ahr in Treatment: 0 Information Obtained From Patient Eyes Medical History: Positive for: Cataracts - bil removed Negative for: Glaucoma; Optic Neuritis Ear/Nose/Mouth/Throat Medical History: Negative for: Chronic sinus problems/congestion; Middle ear problems Respiratory Medical History: Positive for: Chronic Obstructive Pulmonary Disease (COPD); Sleep Apnea - no CPAP Cardiovascular Medical History: Positive for: Angina; Coronary Artery Disease; Hypertension Past Medical History Notes: cardiomyopathy, hyperlipidemia Gastrointestinal Medical History: Past Medical History Notes: GERD Endocrine Medical  History: Negative for: Type I Diabetes; Type II Diabetes Genitourinary Medical History: Negative for: End Stage Renal Disease Past Medical History Notes: CKD stage 3, incontinence Integumentary (Skin) Medical History: Negative for: History of Burn Musculoskeletal Medical History: Positive for: Rheumatoid Arthritis Negative for: Osteoarthritis Past Medical History Notes: kyphosis Neurologic Medical History: Positive for: Dementia - alzheimers Negative for: Neuropathy; Quadriplegia; Paraplegia; Seizure Disorder Oncologic Medical History: Negative for: Received Chemotherapy; Received Radiation Past Medical History Notes: prostate cancer Psychiatric Medical History: Negative for: Anorexia/bulimia; Confinement Anxiety HBO Extended History Items Eyes: Cataracts Immunizations Pneumococcal Vaccine: Received Pneumococcal Vaccination: Yes Received Pneumococcal Vaccination On or After 60th Birthday: Yes Implantable Devices Yes Hospitalization / Surgery History Type of  Hospitalization/Surgery cardiac cath with stent prostatectomy cataract extraction strabismus surgery hemorrhoidectomy tonsillectomy Family and Social History Cancer: Yes - Father,Siblings; Diabetes: No; Heart Disease: No; Hereditary Spherocytosis: No; Hypertension: No; Kidney Disease: No; Lung Disease: No; Seizures: No; Stroke: Yes - Mother; Thyroid Problems: No; Tuberculosis: No; Never smoker; Marital Status - Married; Alcohol Use: Rarely; Drug Use: No History; Caffeine Use: Rarely; Financial Concerns: No; Food, Clothing or Shelter Needs: No; Support System Lacking: No; Transportation Concerns: No Engineer, maintenance) Signed: 12/19/2021 5:26:37 PM By: Fredirick Maudlin MD FACS Signed: 12/19/2021 6:27:50 PM By: Dellie Catholic RN Entered By: Dellie Catholic on 12/19/2021 13:49:12 -------------------------------------------------------------------------------- SuperBill Details Patient Name: Date of Service: Devin Vance, [name] 12/19/2021 Medical Record Number: [id number] Patient Account Number: [id number] Date of Birth/Sex: Treating RN: Dec 30, 1927 (88 y.o. M) Primary Care Provider: Wenda Low Other Clinician: Referring Provider: Treating Provider/Extender: Celine Ahr in Treatment: 0 Diagnosis Coding ICD-10 Codes Code Description [phone] Pressure ulcer of left buttock, stage 2 G30.9 Alzheimer's disease, unspecified J44.9 Chronic obstructive pulmonary disease, unspecified N18.30 Chronic kidney disease, stage 3 unspecified I10 Essential (primary) hypertension Facility Procedures CPT4 Code: [id number] Description: 99214 - WOUND CARE VISIT-LEV 4 EST PT Modifier: Quantity: 1 Physician Procedures : CPT4 Code Description Modifier [id number] - WC PHYS LEVEL 3 - EST PT ICD-10 Diagnosis Description L89.322 Pressure ulcer of left buttock, stage 2 G30.9 Alzheimer's disease, unspecified Quantity: 1 Electronic Signature(s) Signed: 12/19/2021 6:27:50 PM By:  Dellie Catholic RN Signed: 12/20/2021 8:02:50 AM By: Fredirick Maudlin MD FACS Previous Signature: 12/19/2021 2:36:19 PM Version By: Fredirick Maudlin MD FACS Entered By: Dellie Catholic on 12/19/2021 18:24:49

## 2021-12-26 ENCOUNTER — Encounter (HOSPITAL_BASED_OUTPATIENT_CLINIC_OR_DEPARTMENT_OTHER): Payer: Medicare Other | Admitting: General Surgery

## 2021-12-26 ENCOUNTER — Other Ambulatory Visit: Payer: Self-pay

## 2021-12-26 DIAGNOSIS — G309 Alzheimer's disease, unspecified: Secondary | ICD-10-CM | POA: Diagnosis not present

## 2021-12-26 DIAGNOSIS — J449 Chronic obstructive pulmonary disease, unspecified: Secondary | ICD-10-CM | POA: Diagnosis not present

## 2021-12-26 DIAGNOSIS — L89322 Pressure ulcer of left buttock, stage 2: Secondary | ICD-10-CM | POA: Diagnosis not present

## 2021-12-26 DIAGNOSIS — I129 Hypertensive chronic kidney disease with stage 1 through stage 4 chronic kidney disease, or unspecified chronic kidney disease: Secondary | ICD-10-CM | POA: Diagnosis not present

## 2021-12-26 DIAGNOSIS — L98422 Non-pressure chronic ulcer of back with fat layer exposed: Secondary | ICD-10-CM | POA: Diagnosis not present

## 2021-12-26 DIAGNOSIS — M069 Rheumatoid arthritis, unspecified: Secondary | ICD-10-CM | POA: Diagnosis not present

## 2021-12-26 DIAGNOSIS — N183 Chronic kidney disease, stage 3 unspecified: Secondary | ICD-10-CM | POA: Diagnosis not present

## 2021-12-27 NOTE — Progress Notes (Signed)
LARANCE, RATLEDGE ([id number]) ?Visit Report for 12/26/2021 ?Chief Complaint Document Details ?Patient Name: Date of Service: ?[name], [name] 12/26/2021 1:15 PM ?Medical Record Number: [id number] ?Patient Account Number: [id number] ?Date of Birth/Sex: Treating RN: ?[birth year] (88 y.o. M) ?Primary Care Provider: Wenda Low Other Clinician: ?Referring Provider: ?Treating Provider/Extender: Devin Vance ?Wenda Low ?Weeks in Treatment: 1 ?Information Obtained from: Patient ?Chief Complaint ?Buttocks wounds that occurred 1 month ago that have closed and would like further advice on preventative measures; 12/20/21: recent recurrence of buttock ?wound on left. ?Electronic Signature(s) ?Signed: 12/26/2021 1:39:18 PM By: Devin Maudlin MD FACS ?Entered By: Devin Vance on 12/26/2021 13:39:18 ?-------------------------------------------------------------------------------- ?HPI Details ?Patient Name: Date of Service: ?[name], [name] 12/26/2021 1:15 PM ?Medical Record Number: [id number] ?Patient Account Number: [id number] ?Date of Birth/Sex: Treating RN: ?16-Jan-1928 (88 y.o. M) ?Primary Care Provider: Wenda Low Other Clinician: ?Referring Provider: ?Treating Provider/Extender: Devin Vance ?Wenda Low ?Weeks in Treatment: 1 ?History of Present Illness ?HPI Description: Mr. Devin Vance is a 88 year old male with a past medical history of Alzheimer's, stage III kidney disease And rheumatoid arthritis that ?presents to the clinic for pressure sores on his buttocks that have closed. ?The patient had wounds limited to skin breakdown that started about 1 month ago. Wife and daughter are present And provide most of the history. ?His wife states that he will have episodes of diarrhea that cause the skin to breakdown. He was seen by urgent care on 4/22 for these wounds and was ?prescribed doxycycline for cellulitis. He started using Balmex daily and this helped with wound healing. Over the course of the last few  weeks his wounds have ?improved and currently has no open wounds. He sleeps in a recliner at night and uses a cushion called PURAP. He is mobile with a walker. He currently denies ?signs of infection. ?Readmission 12/20/2021 ?Mr. Devin Vance is now 88 years old. Past medical history notable for Alzheimer's disease, stage III kidney disease, rheumatoid arthritis, hypertension, and COPD. ?Recently, an area on his left buttock reopened. It is stage II. There is surrounding dry skin. They have been applying Balmex. It had nearly closed but he had a ?small area of excoriation that his wife applied a Band-Aid to. When she removed this, the wound reopened. No significant pain in the area. No concern for ?infection. His wife, however, seems quite anxious about the wound and desires information about preventing further episodes. ?12/26/2021: Today, the wound has closed. ?Electronic Signature(s) ?Signed: 12/26/2021 1:39:45 PM By: Devin Maudlin MD FACS ?Entered By: Devin Vance on 12/26/2021 13:39:44 ?-------------------------------------------------------------------------------- ?Physical Exam Details ?Patient Name: Date of Service: ?[name], [name] 12/26/2021 1:15 PM ?Medical Record Number: [id number] ?Patient Account Number: [id number] ?Date of Birth/Sex: Treating RN: ?07-31-1928 (88 y.o. M) ?Primary Care Provider: Wenda Low Other Clinician: ?Referring Provider: ?Treating Provider/Extender: Devin Vance ?Wenda Low ?Weeks in Treatment: 1 ?Constitutional ?. . . . No acute distress. ?Respiratory ?Normal work of breathing on room air. ?Notes ?12/26/2021: Wound examthe open area has closed. He does have very thin skin in the area with dry flaking areas on both buttocks. ?Electronic Signature(s) ?Signed: 12/26/2021 1:41:01 PM By: Devin Maudlin MD FACS ?Entered By: Devin Vance on 12/26/2021 13:41:00 ?-------------------------------------------------------------------------------- ?Physician Orders Details ?Patient  Name: Date of Service: ?[name], [name] 12/26/2021 1:15 PM ?Medical Record Number: [id number] ?Patient Account Number: [id number] ?Date of Birth/Sex: Treating RN: ?Jun 19, 1928 (88 y.o. M) Rolin Barry, Bobbi ?Primary Care Provider: Wenda Low Other Clinician: ?Referring Provider: ?Treating Provider/Extender: Celine Ahr  Anderson Malta ?Wenda Low ?Weeks in Treatment: 1 ?Verbal / Phone Orders: No ?Diagnosis Coding ?ICD-10 Coding ?Code Description ?T51.761 Pressure ulcer of left buttock, stage 2 ?G30.9 Alzheimer's disease, unspecified ?J44.9 Chronic obstructive pulmonary disease, unspecified ?N18.30 Chronic kidney disease, stage 3 unspecified ?I10 Essential (primary) hypertension ?Discharge From [facility] ?Discharge from [place] - zinc oxide to buttock area for life for protection. ?Use bordered foam for at least one more week. ?Electronic Signature(s) ?Signed: 12/26/2021 1:41:36 PM By: Devin Maudlin MD FACS ?Entered By: Devin Vance on 12/26/2021 13:41:35 ?-------------------------------------------------------------------------------- ?Problem List Details ?Patient Name: Date of Service: ?[name], [name] 12/26/2021 1:15 PM ?Medical Record Number: [id number] ?Patient Account Number: [id number] ?Date of Birth/Sex: Treating RN: ?Jun 12, 1928 (88 y.o. M) ?Primary Care Provider: Wenda Low Other Clinician: ?Referring Provider: ?Treating Provider/Extender: Devin Vance ?Wenda Low ?Weeks in Treatment: 1 ?Active Problems ?ICD-10 ?Encounter ?Code Description Active Date MDM ?Diagnosis ?I94.854 Pressure ulcer of left buttock, stage 2 12/19/2021 No Yes ?G30.9 Alzheimer's disease, unspecified 12/19/2021 No Yes ?J44.9 Chronic obstructive pulmonary disease, unspecified 12/19/2021 No Yes ?N18.30 Chronic kidney disease, stage 3 unspecified 12/19/2021 No Yes ?I10 Essential (primary) hypertension 12/19/2021 No Yes ?Inactive Problems ?Resolved Problems ?Electronic Signature(s) ?Signed: 12/26/2021 1:38:36 PM By: Devin Maudlin MD  FACS ?Entered By: Devin Vance on 12/26/2021 13:38:36 ?-------------------------------------------------------------------------------- ?Progress Note Details ?Patient Name: Date of Service: ?[name], [name] 12/26/2021 1:15 PM ?Medical Record Number: [id number] ?Patient Account Number: [id number] ?Date of Birth/Sex: Treating RN: ?05/29/1928 (88 y.o. M) ?Primary Care Provider: Wenda Low Other Clinician: ?Referring Provider: ?Treating Provider/Extender: Devin Vance ?Wenda Low ?Weeks in Treatment: 1 ?Subjective ?Chief Complaint ?Information obtained from Patient ?Buttocks wounds that occurred 1 month ago that have closed and would like further advice on preventative measures; 12/20/21: recent recurrence of buttock ?wound on left. ?History of Present Illness (HPI) ?Mr. Devin Vance is a 88 year old male with a past medical history of Alzheimer's, stage III kidney disease And rheumatoid arthritis that presents to the clinic for ?pressure sores on his buttocks that have closed. ?The patient had wounds limited to skin breakdown that started about 1 month ago. Wife and daughter are present And provide most of the history. ?His wife states that he will have episodes of diarrhea that cause the skin to breakdown. He was seen by urgent care on 4/22 for these wounds and was ?prescribed doxycycline for cellulitis. He started using Balmex daily and this helped with wound healing. Over the course of the last few weeks his wounds have ?improved and currently has no open wounds. He sleeps in a recliner at night and uses a cushion called PURAP. He is mobile with a walker. He currently denies ?signs of infection. ?Readmission 12/20/2021 ?Mr. Devin Vance is now 88 years old. Past medical history notable for Alzheimer's disease, stage III kidney disease, rheumatoid arthritis, hypertension, and COPD. ?Recently, an area on his left buttock reopened. It is stage II. There is surrounding dry skin. They have been applying Balmex. It had  nearly closed but he had a ?small area of excoriation that his wife applied a Band-Aid to. When she removed this, the wound reopened. No significant pain in the area. No concern for ?infection. His wife

## 2021-12-27 NOTE — Progress Notes (Signed)
Devin Vance ([id number]) ?Visit Report for 12/26/2021 ?Arrival Information Details ?Patient Name: Date of Service: ?Devin Devin Vance, [name] 12/26/2021 1:15 PM ?Medical Record Number: [id number] ?Patient Account Number: [id number] ?Date of Birth/Sex: Treating RN: ?10-May-1928 (88 y.o. M) Devin Vance ?Primary Care Devin Vance: Devin Vance Other Clinician: ?Referring Devin Vance: ?Treating Devin Vance/Extender: Devin Vance ?Devin Vance ?Weeks in Treatment: 1 ?Visit Information History Since Last Visit ?Added or deleted any medications: No ?Patient Arrived: Devin Vance ?Any new allergies or adverse reactions: No ?Arrival Time: 13:24 ?Had a fall or experienced change in No ?Accompanied By: family ?activities of daily living that may affect ?Transfer Assistance: None ?risk of falls: ?Patient Identification Verified: Yes ?Signs or symptoms of abuse/neglect since last visito No ?Secondary Verification Process Completed: Yes ?Hospitalized since last visit: No ?Patient Requires Transmission-Based Precautions: No ?Implantable device outside of the clinic excluding No ?Patient Has Alerts: No ?cellular tissue based products placed in the center ?since last visit: ?Has Dressing in Place as Prescribed: Yes ?Pain Present Now: No ?Electronic Signature(s) ?Signed: 12/26/2021 5:36:50 PM By: Devin Vance ?Entered By: Devin Pilling on 12/26/2021 13:24:54 ?-------------------------------------------------------------------------------- ?Clinic Level of Care Assessment Details ?Patient Name: Date of Service: ?Devin Devin Vance, [name] 12/26/2021 1:15 PM ?Medical Record Number: [id number] ?Patient Account Number: [id number] ?Date of Birth/Sex: Treating RN: ?06-25-1928 (88 y.o. M) Devin Vance ?Primary Care Devin Vance: Devin Vance Other Clinician: ?Referring Devin Vance: ?Treating Hannalee Castor/Extender: Devin Vance ?Devin Vance ?Weeks in Treatment: 1 ?Clinic Level of Care Assessment Items ?TOOL 4 Quantity Score ?X- 1 0 ?Use when only an EandM is  performed on FOLLOW-UP visit ?ASSESSMENTS - Nursing Assessment / Reassessment ?X- 1 10 ?Reassessment of Co-morbidities (includes updates in patient status) ?X- 1 5 ?Reassessment of Adherence to Treatment Plan ?ASSESSMENTS - Wound and Skin A ssessment / Reassessment ?X - Simple Wound Assessment / Reassessment - one wound 1 5 ?'[]'$  - 0 ?Complex Wound Assessment / Reassessment - multiple wounds ?X- 1 10 ?Dermatologic / Skin Assessment (not related to wound area) ?ASSESSMENTS - Focused Assessment ?'[]'$  - 0 ?Circumferential Edema Measurements - multi extremities ?X- 1 10 ?Nutritional Assessment / Counseling / Intervention ?'[]'$  - 0 ?Lower Extremity Assessment (monofilament, tuning fork, pulses) ?'[]'$  - 0 ?Peripheral Arterial Disease Assessment (using hand held doppler) ?ASSESSMENTS - Ostomy and/or Continence Assessment and Care ?'[]'$  - 0 ?Incontinence Assessment and Management ?'[]'$  - 0 ?Ostomy Care Assessment and Management (repouching, etc.) ?PROCESS - Coordination of Care ?X - Simple Patient / Family Education for ongoing care 1 15 ?'[]'$  - 0 ?Complex (extensive) Patient / Family Education for ongoing care ?X- 1 10 ?Staff obtains Consents, Records, T Results / Process Orders ?est ?'[]'$  - 0 ?Staff telephones HHA, Nursing Homes / Clarify orders / etc ?'[]'$  - 0 ?Routine Transfer to another Facility (non-emergent condition) ?'[]'$  - 0 ?Routine Hospital Admission (non-emergent condition) ?'[]'$  - 0 ?New Admissions / Biomedical engineer / Ordering NPWT Apligraf, etc. ?, ?'[]'$  - 0 ?Emergency Hospital Admission (emergent condition) ?X- 1 10 ?Simple Discharge Coordination ?'[]'$  - 0 ?Complex (extensive) Discharge Coordination ?PROCESS - Special Needs ?'[]'$  - 0 ?Pediatric / Minor Patient Management ?'[]'$  - 0 ?Isolation Patient Management ?'[]'$  - 0 ?Hearing / Language / Visual special needs ?'[]'$  - 0 ?Assessment of Community assistance (transportation, D/C planning, etc.) ?'[]'$  - 0 ?Additional assistance / Altered mentation ?'[]'$  - 0 ?Support Surface(s) Assessment  (bed, cushion, Vance, etc.) ?INTERVENTIONS - Wound Cleansing / Measurement ?X - Simple Wound Cleansing - one wound 1 5 ?'[]'$  - 0 ?Complex Wound Cleansing -  multiple wounds ?X- 1 5 ?Wound Imaging (photographs - any number of wounds) ?'[]'$  - 0 ?Wound Tracing (instead of photographs) ?X- 1 5 ?Simple Wound Measurement - one wound ?'[]'$  - 0 ?Complex Wound Measurement - multiple wounds ?INTERVENTIONS - Wound Dressings ?X - Small Wound Dressing one or multiple wounds 1 10 ?'[]'$  - 0 ?Medium Wound Dressing one or multiple wounds ?'[]'$  - 0 ?Large Wound Dressing one or multiple wounds ?'[]'$  - 0 ?Application of Medications - topical ?'[]'$  - 0 ?Application of Medications - injection ?INTERVENTIONS - Miscellaneous ?'[]'$  - 0 ?External ear exam ?'[]'$  - 0 ?Specimen Collection (cultures, biopsies, blood, body fluids, etc.) ?'[]'$  - 0 ?Specimen(s) / Culture(s) sent or taken to Lab for analysis ?'[]'$  - 0 ?Patient Transfer (multiple staff / Civil Service fast streamer / Similar devices) ?'[]'$  - 0 ?Simple Staple / Suture removal (25 or less) ?'[]'$  - 0 ?Complex Staple / Suture removal (26 or more) ?'[]'$  - 0 ?Hypo / Hyperglycemic Management (close monitor of Blood Glucose) ?'[]'$  - 0 ?Ankle / Brachial Index (ABI) - do not check if billed separately ?X- 1 5 ?Vital Signs ?Has the patient been seen at the hospital within the last three years: Yes ?Total Score: 105 ?Level Of Care: New/Established - Level 3 ?Electronic Signature(s) ?Signed: 12/26/2021 5:36:50 PM By: Devin Vance ?Entered By: Devin Pilling on 12/26/2021 14:21:44 ?-------------------------------------------------------------------------------- ?Encounter Discharge Information Details ?Patient Name: Date of Service: ?Devin Devin Vance, [name] 12/26/2021 1:15 PM ?Medical Record Number: [id number] ?Patient Account Number: [id number] ?Date of Birth/Sex: Treating RN: ?09-Aug-1928 (88 y.o. M) Devin Vance ?Primary Care Taaj Hurlbut: Devin Vance Other Clinician: ?Referring Holy Battenfield: ?Treating Jordan Caraveo/Extender: Devin Vance ?Devin Vance ?Weeks in Treatment: 1 ?Encounter Discharge Information Items ?Discharge Condition: Stable ?Ambulatory Status: Devin Vance ?Discharge Destination: Home ?Transportation: Private Auto ?Accompanied By: family ?Schedule Follow-up Appointment: No ?Clinical Summary of Care: ?Notes ?zinc oxide and bordered foam to left gluteus for protection. ?Electronic Signature(s) ?Signed: 12/26/2021 5:36:50 PM By: Devin Vance ?Entered By: Devin Pilling on 12/26/2021 14:22:37 ?-------------------------------------------------------------------------------- ?Lower Extremity Assessment Details ?Patient Name: Date of Service: ?Devin Devin Vance, [name] 12/26/2021 1:15 PM ?Medical Record Number: [id number] ?Patient Account Number: [id number] ?Date of Birth/Sex: Treating RN: ?09-09-1928 (88 y.o. M) Devin Vance ?Primary Care Yaman Grauberger: Devin Vance Other Clinician: ?Referring Mareon Robinette: ?Treating Rasul Decola/Extender: Devin Vance ?Devin Vance ?Weeks in Treatment: 1 ?Electronic Signature(s) ?Signed: 12/26/2021 5:36:50 PM By: Devin Vance ?Entered By: Devin Pilling on 12/26/2021 13:25:13 ?-------------------------------------------------------------------------------- ?Multi Wound Chart Details ?Patient Name: ?Date of Service: ?Devin Devin Vance, [name] 12/26/2021 1:15 PM ?Medical Record Number: [id number] ?Patient Account Number: [id number] ?Date of Birth/Sex: ?Treating RN: ?Jul 05, 1928 (88 y.o. M) ?Primary Care Nalah Macioce: Devin Vance ?Other Clinician: ?Referring Margrete Delude: ?Treating Kentley Blyden/Extender: Devin Vance ?Devin Vance ?Weeks in Treatment: 1 ?Vital Signs ?Height(in): 70 ?Pulse(bpm): 60 ?Weight(lbs): 180 ?Blood Pressure(mmHg): 124/78 ?Body Mass Index(BMI): 25.8 ?Temperature(??F): 97.5 ?Respiratory Rate(breaths/min): 20 ?Photos: [Devin Vance/A:Devin Vance/A] ?Left, Medial Gluteus Devin Vance/A Devin Vance/A ?Wound Location: ?Trauma Devin Vance/A Devin Vance/A ?Wounding Event: ?Pressure Ulcer Devin Vance/A Devin Vance/A ?Primary Etiology: ?Cataracts, Chronic Obstructive Devin Vance/A Devin Vance/A ?Comorbid History: ?Pulmonary  Disease (COPD), Sleep ?Apnea, Angina, Coronary Artery ?Disease, Hypertension, Rheumatoid ?Arthritis, Dementia ?12/18/2021 Devin Vance/A Devin Vance/A ?Date Acquired: ?1 Devin Vance/A Devin Vance/A ?Weeks of Treatment: ?Open Devin Vance/A Devin Vance/A ?Wound Status: ?No Devin Vance/A

## 2022-02-01 DIAGNOSIS — D6489 Other specified anemias: Secondary | ICD-10-CM | POA: Diagnosis not present

## 2022-03-12 ENCOUNTER — Other Ambulatory Visit: Payer: Self-pay | Admitting: Neurology

## 2022-03-13 NOTE — Telephone Encounter (Signed)
Rx refilled.

## 2022-04-04 DIAGNOSIS — G4733 Obstructive sleep apnea (adult) (pediatric): Secondary | ICD-10-CM | POA: Diagnosis not present

## 2022-04-04 DIAGNOSIS — I429 Cardiomyopathy, unspecified: Secondary | ICD-10-CM | POA: Diagnosis not present

## 2022-04-04 DIAGNOSIS — R7303 Prediabetes: Secondary | ICD-10-CM | POA: Diagnosis not present

## 2022-04-04 DIAGNOSIS — I2510 Atherosclerotic heart disease of native coronary artery without angina pectoris: Secondary | ICD-10-CM | POA: Diagnosis not present

## 2022-04-04 DIAGNOSIS — E039 Hypothyroidism, unspecified: Secondary | ICD-10-CM | POA: Diagnosis not present

## 2022-04-04 DIAGNOSIS — I700 Atherosclerosis of aorta: Secondary | ICD-10-CM | POA: Diagnosis not present

## 2022-04-04 DIAGNOSIS — N1831 Chronic kidney disease, stage 3a: Secondary | ICD-10-CM | POA: Diagnosis not present

## 2022-04-04 DIAGNOSIS — E785 Hyperlipidemia, unspecified: Secondary | ICD-10-CM | POA: Diagnosis not present

## 2022-04-04 DIAGNOSIS — Z8546 Personal history of malignant neoplasm of prostate: Secondary | ICD-10-CM | POA: Diagnosis not present

## 2022-04-04 DIAGNOSIS — G309 Alzheimer's disease, unspecified: Secondary | ICD-10-CM | POA: Diagnosis not present

## 2022-04-04 DIAGNOSIS — J449 Chronic obstructive pulmonary disease, unspecified: Secondary | ICD-10-CM | POA: Diagnosis not present

## 2022-04-04 DIAGNOSIS — I10 Essential (primary) hypertension: Secondary | ICD-10-CM | POA: Diagnosis not present

## 2022-04-04 DIAGNOSIS — Z0000 Encounter for general adult medical examination without abnormal findings: Secondary | ICD-10-CM | POA: Diagnosis not present

## 2022-05-08 ENCOUNTER — Emergency Department (HOSPITAL_BASED_OUTPATIENT_CLINIC_OR_DEPARTMENT_OTHER): Payer: Medicare Other | Admitting: Radiology

## 2022-05-08 ENCOUNTER — Emergency Department (HOSPITAL_BASED_OUTPATIENT_CLINIC_OR_DEPARTMENT_OTHER)
Admission: EM | Admit: 2022-05-08 | Discharge: 2022-05-09 | Disposition: A | Discharge status: Home / Self Care | Payer: Medicare Other | Attending: Emergency Medicine | Admitting: Emergency Medicine

## 2022-05-08 ENCOUNTER — Other Ambulatory Visit: Payer: Self-pay

## 2022-05-08 ENCOUNTER — Encounter (HOSPITAL_BASED_OUTPATIENT_CLINIC_OR_DEPARTMENT_OTHER): Payer: Self-pay

## 2022-05-08 ENCOUNTER — Emergency Department (HOSPITAL_BASED_OUTPATIENT_CLINIC_OR_DEPARTMENT_OTHER): Payer: Medicare Other

## 2022-05-08 DIAGNOSIS — J9811 Atelectasis: Secondary | ICD-10-CM | POA: Diagnosis not present

## 2022-05-08 DIAGNOSIS — R509 Fever, unspecified: Secondary | ICD-10-CM | POA: Diagnosis not present

## 2022-05-08 DIAGNOSIS — Z5321 Procedure and treatment not carried out due to patient leaving prior to being seen by health care provider: Secondary | ICD-10-CM | POA: Diagnosis not present

## 2022-05-08 DIAGNOSIS — Z20822 Contact with and (suspected) exposure to covid-19: Secondary | ICD-10-CM | POA: Insufficient documentation

## 2022-05-08 DIAGNOSIS — R5383 Other fatigue: Secondary | ICD-10-CM | POA: Insufficient documentation

## 2022-05-08 LAB — RESP PANEL BY RT-PCR (FLU A&B, COVID) ARPGX2
Influenza A by PCR: NEGATIVE
Influenza B by PCR: NEGATIVE
SARS Coronavirus 2 by RT PCR: NEGATIVE

## 2022-05-08 NOTE — ED Triage Notes (Signed)
Patient here POV from [facility] with Family.  Family endorses Noted Fever today associated with Fatigue. Fever of 101.5 Today. No Pain.   Home COVID-19 Test was Negative but Residents at Facility have Been Positive. Sent by PCP for Testing.  NAD Noted during Triage. A&Ox4. GCS 15. BIB Wheelchair.

## 2022-05-09 NOTE — ED Notes (Signed)
Pt daughter sts that they no longer want to wait they seen the test results and that's all they were worried about and plan to f/u with PCP. Pt assisted to car via wheelchair, BIB wheelchair on arrival.

## 2022-05-31 ENCOUNTER — Encounter: Payer: Self-pay | Admitting: Neurology

## 2022-05-31 ENCOUNTER — Ambulatory Visit: Payer: Medicare Other | Admitting: Neurology

## 2022-05-31 ENCOUNTER — Ambulatory Visit (INDEPENDENT_AMBULATORY_CARE_PROVIDER_SITE_OTHER): Payer: Medicare Other | Admitting: Neurology

## 2022-05-31 VITALS — BP 119/61 | HR 60 | Ht 70.0 in | Wt 194.0 lb

## 2022-05-31 DIAGNOSIS — G301 Alzheimer's disease with late onset: Secondary | ICD-10-CM

## 2022-05-31 DIAGNOSIS — G309 Alzheimer's disease, unspecified: Secondary | ICD-10-CM | POA: Diagnosis not present

## 2022-05-31 DIAGNOSIS — F03918 Unspecified dementia, unspecified severity, with other behavioral disturbance: Secondary | ICD-10-CM

## 2022-05-31 DIAGNOSIS — F02818 Dementia in other diseases classified elsewhere, unspecified severity, with other behavioral disturbance: Secondary | ICD-10-CM

## 2022-05-31 MED ORDER — MEMANTINE HCL 10 MG PO TABS: 10.0000 mg | ORAL_TABLET | Freq: Two times a day (BID) | ORAL | 4 refills | Status: AC

## 2022-05-31 MED ORDER — QUETIAPINE FUMARATE 25 MG PO TABS: 25.0000 mg | ORAL_TABLET | Freq: Every day | ORAL | 4 refills | Status: AC

## 2022-05-31 NOTE — Progress Notes (Signed)
PATIENT: Devin Vance DOB: 21-Jan-1928  REASON FOR VISIT: follow up HISTORY FROM: family  05/31/2022: he is stable, they had a pcr test because of fatigue and he was negative through covid. One small fall and it was mechanical he tripped over something the EMTs checked him out and helped get him off the florr, eating well, a little bit more confused lately, he will doze off sometimes, he has a kitty he loves, behavior has been fine, no need to change. Depakote was stopped in the past. Doing well on low dose seroquel. Here with wife and daughter who provide all information.  Patient complains of symptoms per HPI as well as the following symptoms: alzheimer's disease . Pertinent negatives and positives per HPI. All others negative   11/19/2021: I am meeting this patient with progressive dementia for the first time today.  He is here with his wife and daughter who provide most information.  Extended visit today.  We spoke about patient's history, as a summary he is a 88 year old male with a history of progressive Alzheimer's disease.  Due to his Alzheimer's disease, he has parkinsonian features (not Parkinson's disease), respite all was tried in the past for behavioral issues as well as Depakote but those were discontinued, he is on Namenda, he has been doing well, remains on Seroquel at bedtime, no falls, he uses a walker although he did not bring it today.  Per his family, eating well, weight is stable, no problems with swallowing and choking, they monitor his size of his bites, he was agitated at one time but now there is no agitation. He is due for a checkup in his teeth, he has a runny nose. No recent falls. He takes a boxing class with patient with parkinsonism. [facility] They got the hospital bed but doesn't use it and they have a lift chair that helps. He can't get in and out of it. She has PT in the building, needs help with getting in and out of the  hospital bed, we can get someone in the house to help however they declined, and have a nurse coming out on the 14th and they will discuss that with her. Overall they are doing well and today we reviewed history and answered all questions from family.   HISTORY OF PRESENT ILLNESS: Today 06/02/22 Devin Vance is a 88 year old male with history of progressive dementing illness consistent with Alzheimer's disease.  He has had parkinsonian features with Risperdal, thus discontinued.  He is on Namenda.  His wife has called reporting more agitation with his aide, Depakote was increased 500 mg at bedtime, took to ER for AMS, Depakote level was 47. Has remained off since, mood has been doing well.  Is not sleeping as much during the day.  Remains on Seroquel 25 mg at bedtime.  Sleeps fairly well, urinary frequency does wake him up.  No falls, his balance has been good.  He uses a walker, didn't bring it today, too heavy for his wife to lift. Wife gets frustrated when he isn't active enough or sits down to wait for elevator instead of standing. Loves doing chair boxing. The aide has been back and all is well. His wife had eye surgery last week, doing well.  Here today for evaluation accompanied by his wife, and daughter Devin Vance.  In ER, negative for UTI, CBC showed HGB 10.7, platelets 145, creatinine 1.53.  Update 10/25/2020 SS: Devin Vance is a 88 year old male with history of progressive dementing illness consistent with Alzheimer's disease.  He is on Depakote and Seroquel for episodes of agitation.  He had Parkinson features with Risperdal, walking improved some with Risperdal discontinuation.  Remains overall stable, continues to have some decline in memory, has trouble remembering his children's names.  His mood is overall good, no episodes of agitation or hallucinations.  He is walking with a walker, does well with this.  They reside in a senior living apartment, he will go down to the meal hall and  bring back their meals.  He has trouble sleeping, partly due to incontinence issues, does have daytime drowsiness.  He has periods of constipation followed by diarrhea.  His wife thinks he could benefit more from trazodone, which she was previously on.  Does a few days a week of chair boxing class.  Participates in [employer] dom[employer]es with other residents that requires math skills.  Presents today for evaluation accompanied by his wife Devin Vance.  HISTORY  04/21/2020 [name]: Devin Vance is a 88 year old right-handed white male with a history of a progressive dementing illness consistent with Alzheimer's disease.  The patient has had episodes of agitation in the past, he is currently on Depakote 500 mg at night and Seroquel 25 mg at night.  The wife indicates that he sleeps well at night but also sleeps all day long.  He is minimally active, he has not had any hallucinations or agitation.  He walks with a walker for longer distances.  He did have some parkinsonian features with Risperdal, his walking has improved some stopping the medication but he still take short shuffling steps.  He recently was reduced on his metoprolol dose due to some reduction in blood pressures.  He returns for an evaluation.  REVIEW OF SYSTEMS: Out of a complete 14 system review of symptoms, the patient complains only of the following symptoms, and all other reviewed systems are negative.  Memory loss, walking difficulty.  ALLERGIES: Allergies  Allergen Reactions   Lorazepam Other (See Comments)    Generalized weakness    HOME MEDICATIONS: Outpatient Medications Prior to Visit  Medication Sig Dispense Refill   albuterol (VENTOLIN HFA) 108 (90 Base) MCG/ACT inhaler Inhale 2 puffs into the lungs every 4 (four) hours as needed for wheezing or shortness of breath. 18 g 0   aspirin EC 81 MG tablet Take by mouth.     CALCIUM CITRATE PO Take 300 mg by mouth.     Cholecalciferol (VITAMIN D-3 PO) Take 1,000 units of lipase by mouth.      Cyanocobalamin (VITAMIN B12) 1000 MCG TBCR 2,500 mcg daily.     Ferrous Sulfate (IRON PO) Take 325 mg by mouth daily.     Fluticasone Propionate (FLONASE ALLERGY RELIEF NA) Place into the nose.     hydroxychloroquine (PLAQUENIL) 200 MG tablet Take 200 mg by mouth daily. One pill '200mg'$  every other day , other days two pills     ketoconazole (NIZORAL) 2 % shampoo      Lactobacillus (PROBIOTIC ACIDOPHILUS PO) Take 1 tablet by mouth daily.     levothyroxine (SYNTHROID) 25 MCG tablet 1 tablet in the morning on an empty stomach     metoprolol succinate (TOPROL-XL) 25 MG 24 hr tablet Take 12.5 mg by mouth.      Multiple Vitamin (MULTIVITAMIN) capsule Take by mouth.     Omega-3 Fatty Acids (FISH OIL) 1200 MG CAPS Take by mouth.  memantine (NAMENDA) 10 MG tablet Take 1 tablet by mouth 2 (two) times daily.     QUEtiapine (SEROQUEL) 25 MG tablet TAKE 1 TABLET BY MOUTH EVERY NIGHT 30 tablet 2   hydroxychloroquine (PLAQUENIL) 200 MG tablet Take 1 tablet by mouth every morning. (Patient not taking: Reported on 05/31/2022)     memantine (NAMENDA) 10 MG tablet Take 1 tablet (10 mg total) by mouth 2 (two) times daily. 180 tablet 0   metoprolol succinate (TOPROL-XL) 25 MG 24 hr tablet      rosuvastatin (CRESTOR) 10 MG tablet  (Patient not taking: Reported on 05/31/2022)     No facility-administered medications prior to visit.    PAST MEDICAL HISTORY: Past Medical History:  Diagnosis Date   Alzheimer disease ([place]) 07/31/2018   Angina pectoris (HCC)    Arthritis    Cardiomyopathy (HCC)    COPD (chronic obstructive pulmonary disease) (HCC)    Gastroesophageal reflux disease    History of colon polyps    Hyperlipidemia    Hypertension    Obesity    Osteoporosis    Polymyalgia rheumatica (HCC)    Prostate cancer (HCC)    Rheumatoid arthritis ([place])    Secondary Parkinson disease ([place]) 08/13/2019   Sleep apnea     PAST SURGICAL HISTORY: Past Surgical History:  Procedure Laterality Date   CATARACT  EXTRACTION     CORONARY ANGIOPLASTY     HEMORRHOIDECTOMY WITH HEMORRHOID BANDING     PROSTATECTOMY     STRABISMUS SURGERY     TONSILLECTOMY      FAMILY HISTORY: Family History  Problem Relation Age of Onset   Stroke Mother    Dementia Mother    Cancer Father    Dementia Sister    Dementia Maternal Aunt     SOCIAL HISTORY: Social History   Socioeconomic History   Marital status: Married    Spouse name: Not on file   Number of children: Not on file   Years of education: Not on file   Highest education level: Doctorate  Occupational History   Not on file  Tobacco Use   Smoking status: Never   Smokeless tobacco: Never  Substance and Sexual Activity   Alcohol use: Not Currently   Drug use: Not Currently   Sexual activity: Not on file  Other Topics Concern   Not on file  Social History Narrative   Lives at [facility] with his wife in independent living   Social Determinants of Health   Financial Resource Strain: Not on file  Food Insecurity: Not on file  Transportation Needs: Not on file  Physical Activity: Not on file  Stress: Not on file  Social Connections: Not on file  Intimate Partner Violence: Not on file   PHYSICAL EXAM  Vitals:   05/31/22 1418  BP: 119/61  Pulse: 60  Weight: 194 lb (88 kg)  Height: '5\' 10"'$  (1.778 m)   Body mass index is 27.84 kg/m.  Patient is in no acute distress, 88 year old male elderly, well dressed and well groomed, he is pleasant, very quiet, appears slightly frail, he follows most 1 or 2 step commands but has difficulty with more complex instructions his speech and language are fluent, no aphasia, hypophonia, pupils pinpoint could not visualize fundi due to small pupils, pupils equal and symmetrical, extraocular movements were full except possibly some decrease in elevation, visual fields full to threat in all quadrants, difficult due to his advanced dementia to perform a detailed exam but all extremities are  antigravity  without focal deficits, no tremor was noted at rest he does have a slight postural and action tremor, he has a stooped posture, low clearance, bradykinesia, narrow gait and short steps, no significant imbalance noticed using a walker.      12/10/2019    2:45 PM 07/15/2019   12:42 PM 07/31/2018    2:54 PM  MMSE - Mini Mental State Exam  Not completed: Unable to complete    Orientation to time 0 3 4  Orientation to Place  0 3  Registration  3 3  Attention/ Calculation  0 5  Recall  0 0  Language- name 2 objects  2 2  Language- repeat  0 1  Language- follow 3 step command  2 2  Language- follow 3 step command-comments  he held the paper in the air after he folded it   Language- read & follow direction  1 1  Write a sentence  1 1  Copy design  0 1  Total score  12 23    DIAGNOSTIC DATA (LABS, IMAGING, TESTING) - I reviewed patient records, labs, notes, testing and imaging myself where available.  Lab Results  Component Value Date   WBC 5.6 02/16/2021   HGB 10.7 (L) 02/16/2021   HCT 34.3 (L) 02/16/2021   MCV 93.5 02/16/2021   PLT 145 (L) 02/16/2021      Component Value Date/Time   NA 141 02/16/2021 1334   NA 144 07/15/2019 1407   NA 145 04/09/2014 1759   K 4.1 02/16/2021 1334   K 4.2 04/09/2014 1759   CL 108 02/16/2021 1334   CL 112 (H) 04/09/2014 1759   CO2 28 02/16/2021 1334   CO2 27 04/09/2014 1759   GLUCOSE 85 02/16/2021 1334   GLUCOSE 117 (H) 04/09/2014 1759   BUN 24 (H) 02/16/2021 1334   BUN 35 (H) 07/15/2019 1407   BUN 30 (H) 04/09/2014 1759   CREATININE 1.53 (H) 02/16/2021 1334   CREATININE 1.61 (H) 04/09/2014 1759   CALCIUM 8.5 (L) 02/16/2021 1334   CALCIUM 8.6 04/09/2014 1759   PROT 5.0 (L) 02/16/2021 1334   PROT 5.9 (L) 07/15/2019 1407   PROT 6.8 04/09/2014 1759   ALBUMIN 2.9 (L) 02/16/2021 1334   ALBUMIN 4.0 07/15/2019 1407   ALBUMIN 3.5 04/09/2014 1759   AST 17 02/16/2021 1334   AST 34 04/09/2014 1759   ALT 14 02/16/2021 1334   ALT 35 04/09/2014  1759   ALKPHOS 41 02/16/2021 1334   ALKPHOS 62 04/09/2014 1759   BILITOT 0.3 02/16/2021 1334   BILITOT 0.3 07/15/2019 1407   BILITOT 0.3 04/09/2014 1759   GFRNONAA 43 (L) 02/16/2021 1334   GFRNONAA 39 (L) 04/09/2014 1759   GFRAA 47 (L) 07/15/2019 1407   GFRAA 45 (L) 04/09/2014 1759   Lab Results  Component Value Date   CHOL 114 03/22/2014   HDL 38 (L) 03/22/2014   LDLCALC 61 03/22/2014   TRIG 76 03/22/2014   No results found for: "HGBA1C" No results found for: "VITAMINB12" Lab Results  Component Value Date   TSH 4.69 (H) 03/22/2014    ASSESSMENT AND PLAN 88 y.o. year old male  has a past medical history of Alzheimer disease ([place]) (07/31/2018), Angina pectoris ([place]), Arthritis, Cardiomyopathy ([place]), COPD (chronic obstructive pulmonary disease) ([place]), Gastroesophageal reflux disease, History of colon polyps, Hyperlipidemia, Hypertension, Obesity, Osteoporosis, Polymyalgia rheumatica ([place]), Prostate cancer ([place]), Rheumatoid arthritis ([place]), Secondary Parkinson disease ([place]) (08/13/2019), and Sleep apnea. here with: Alzheimer's disease and parkinsonism  secondary to Alzheimer's disease.  -Had side effects to Risperdal and Depakote, continue Seroquel and Namenda.  If needed may increase Seroquel in the future.  At this time he is stable, will continue current management.  -Impaired cognition, advanced dementia, last MMSE in 2020 was 12 out of 30, unable to complete anymore, we will not perform MMSE's in the future.  -Answered all questions, discussed history with wife and daughter.  Answered all questions again today, wife and daughter are happy with his stability, we will follow-up with him again in 6 months, lovely family and they are welcome to call if anything should happen, we did discuss appropriate fluid intake, that if patient acutely declines likely medical as opposed to dementia and to ensure he has a medical evaluation looking for infectious or toxic metabolic etiologies  first.  Sarina Ill, MD [facility] [address] [place], [place] [address] [phone]  I spent over 30 minutes of face-to-face and non-face-to-face time with patient on the  1. Dementia with behavioral disturbance ([place])   2. Alzheimer's dementia with behavioral disturbance ([place])   3. Severe late onset Alzheimer's dementia with other behavioral disturbance ([place])     diagnosis.  This included previsit chart review, lab review, study review, order entry, electronic health record documentation, patient education on the different diagnostic and therapeutic options, counseling and coordination of care, risks and benefits of management, compliance, or risk factor reduction

## 2022-06-02 DIAGNOSIS — F0280 Dementia in other diseases classified elsewhere without behavioral disturbance: Secondary | ICD-10-CM | POA: Insufficient documentation

## 2022-06-02 DIAGNOSIS — G309 Alzheimer's disease, unspecified: Secondary | ICD-10-CM | POA: Insufficient documentation

## 2022-06-13 DIAGNOSIS — L821 Other seborrheic keratosis: Secondary | ICD-10-CM | POA: Diagnosis not present

## 2022-06-13 DIAGNOSIS — L218 Other seborrheic dermatitis: Secondary | ICD-10-CM | POA: Diagnosis not present

## 2022-06-13 DIAGNOSIS — L820 Inflamed seborrheic keratosis: Secondary | ICD-10-CM | POA: Diagnosis not present

## 2022-08-06 DIAGNOSIS — E039 Hypothyroidism, unspecified: Secondary | ICD-10-CM | POA: Diagnosis not present

## 2022-08-06 DIAGNOSIS — R7303 Prediabetes: Secondary | ICD-10-CM | POA: Diagnosis not present

## 2022-08-06 DIAGNOSIS — Z8546 Personal history of malignant neoplasm of prostate: Secondary | ICD-10-CM | POA: Diagnosis not present

## 2022-08-06 DIAGNOSIS — M069 Rheumatoid arthritis, unspecified: Secondary | ICD-10-CM | POA: Diagnosis not present

## 2022-08-06 DIAGNOSIS — N183 Chronic kidney disease, stage 3 unspecified: Secondary | ICD-10-CM | POA: Diagnosis not present

## 2022-08-06 DIAGNOSIS — F39 Unspecified mood [affective] disorder: Secondary | ICD-10-CM | POA: Diagnosis not present

## 2022-08-06 DIAGNOSIS — J449 Chronic obstructive pulmonary disease, unspecified: Secondary | ICD-10-CM | POA: Diagnosis not present

## 2022-08-06 DIAGNOSIS — G309 Alzheimer's disease, unspecified: Secondary | ICD-10-CM | POA: Diagnosis not present

## 2022-08-06 DIAGNOSIS — I429 Cardiomyopathy, unspecified: Secondary | ICD-10-CM | POA: Diagnosis not present

## 2022-08-06 DIAGNOSIS — M353 Polymyalgia rheumatica: Secondary | ICD-10-CM | POA: Diagnosis not present

## 2022-08-06 DIAGNOSIS — I2510 Atherosclerotic heart disease of native coronary artery without angina pectoris: Secondary | ICD-10-CM | POA: Diagnosis not present

## 2022-08-06 DIAGNOSIS — I700 Atherosclerosis of aorta: Secondary | ICD-10-CM | POA: Diagnosis not present

## 2022-08-15 DIAGNOSIS — Z23 Encounter for immunization: Secondary | ICD-10-CM | POA: Diagnosis not present

## 2022-12-05 ENCOUNTER — Ambulatory Visit (INDEPENDENT_AMBULATORY_CARE_PROVIDER_SITE_OTHER): Payer: Medicare Other | Admitting: Neurology

## 2022-12-05 ENCOUNTER — Encounter: Payer: Self-pay | Admitting: Neurology

## 2022-12-05 VITALS — BP 132/75 | HR 70 | Ht 70.0 in | Wt 201.2 lb

## 2022-12-05 DIAGNOSIS — G301 Alzheimer's disease with late onset: Secondary | ICD-10-CM

## 2022-12-05 DIAGNOSIS — F02C18 Dementia in other diseases classified elsewhere, severe, with other behavioral disturbance: Secondary | ICD-10-CM | POA: Diagnosis not present

## 2022-12-05 MED ORDER — MODAFINIL 100 MG PO TABS: 100.0000 mg | ORAL_TABLET | Freq: Every day | ORAL | 4 refills | Status: AC

## 2022-12-05 NOTE — Patient Instructions (Signed)
Start Modafinil at 16m in the morning  Modafinil Tablets What is this medication? MODAFINIL (moe DAF i nil) treats sleep disorders, such as narcolepsy, obstructive sleep apnea, and shift work disorder. It works by promoting wakefulness. It belongs to a group of medications called stimulants. This medicine may be used for other purposes; ask your health care provider or pharmacist if you have questions. COMMON BRAND NAME(S): Provigil What should I tell my care team before I take this medication? They need to know if you have any of these conditions: Kidney disease Liver disease Mental health conditions An unusual or allergic reaction to modafinil, other medications, foods, dyes, or preservatives Pregnant or trying to get pregnant Breast-feeding How should I use this medication? Take this medication by mouth with water. Take it as directed on the prescription label at the same time every day. You can take it with or without food. If it upsets your stomach, take it with food. Keep taking it unless your care team tells you to stop. A special MedGuide will be given to you by the pharmacist with each prescription and refill. Be sure to read this information carefully each time. Talk to your care team about the use of this medication in children. Special care may be needed. Overdosage: If you think you have taken too much of this medicine contact a poison control center or emergency room at once. NOTE: This medicine is only for you. Do not share this medicine with others. What if I miss a dose? If you miss a dose, take it as soon as you can. If it is almost time for your next dose, take only that dose. Do not take double or extra doses. What may interact with this medication? Do not take this medication with any of the following: Amphetamine or dextroamphetamine Dexmethylphenidate or methylphenidate MAOIs, such as Marplan, Nardil, and Parnate Pemoline Procarbazine This medication may also  interact with the following: Antifungal medications, such as itraconazole or ketoconazole Barbiturates, such as phenobarbital Carbamazepine Cyclosporine Diazepam Estrogen or progestin hormones Medications for mental health conditions Phenytoin Propranolol Triazolam Warfarin This list may not describe all possible interactions. Give your health care provider a list of all the medicines, herbs, non-prescription drugs, or dietary supplements you use. Also tell them if you smoke, drink alcohol, or use illegal drugs. Some items may interact with your medicine. What should I watch for while using this medication? Visit your care team for regular checks on your progress. It may be some time before you see the benefit from this medication. This medication may affect your coordination, reaction time, or judgment. It may also hide signs that you are tired. This medication will not eliminate your abnormal tendency to fall asleep and is not a replacement for sleep. Do not drive or operate machinery until you know how this medication affects you. Sit up or stand slowly to reduce the risk of dizzy or fainting spells. Drinking alcohol with this medication can increase the risk of these side effects. This medication may cause serious skin reactions. They can happen weeks to months after starting the medication. Contact your care team right away if you notice fevers or flu-like symptoms with a rash. The rash may be red or purple and then turn into blisters or peeling of the skin. You may also notice a red rash with swelling of the face, lips, or lymph nodes in your neck or under your arms. Estrogen and progestin hormones may not work as well while you are taking  this medication. If you are using these hormones for contraception, talk to your care team about using a second type of contraception. A barrier contraceptive, such as a condom or diaphragm, is recommended. It is unknown if the effects of this medication  will be increased by the use of caffeine. Caffeine is found in many foods, beverages, and medications. Ask your care team if you should limit or change your intake of caffeine-containing products while on this medication. What side effects may I notice from receiving this medication? Side effects that you should report to your care team as soon as possible: Allergic reactions or angioedema--skin rash, itching or hives, swelling of the face, eyes, lips, tongue, arms, or legs, trouble swallowing or breathing Increase in blood pressure Mood and behavior changes--anxiety, nervousness, confusion, hallucinations, irritability, hostility, thoughts of suicide or self-harm, worsening mood, feelings of depression Rash, fever, and swollen lymph nodes Redness, blistering, peeling, or loosening of the skin, including inside the mouth Side effects that usually do not require medical attention (report to your care team if they continue or are bothersome): Anxiety, nervousness Dizziness Headache Nausea Trouble sleeping This list may not describe all possible side effects. Call your doctor for medical advice about side effects. You may report side effects to FDA at 1-800-FDA-1088. Where should I keep my medication? Keep out of the reach of children and pets. This medication can be abused. Keep it in a safe place to protect it from theft. Do not share it with anyone. It is only for you. Selling or giving away this medication is dangerous and against the law. Store at room temperature between 20 and 25 degrees C (68 and 77 degrees F). Get rid of any unused medication after the expiration date. This medication may cause harm and death if it is taken by other adults, children, or pets. It is important to get rid of the medication as soon as you no longer need it or it is expired. You can do this in two ways: Take the medication to a medication take-back program. Check with your pharmacy or law enforcement to find a  location. If you cannot return the medication, check the label or package insert to see if the medication should be thrown out in the garbage or flushed down the toilet. If you are not sure, ask your care team. If it is safe to put it in the trash, take the medication out of the container. Mix the medication with cat litter, dirt, coffee grounds, or other unwanted substance. Seal the mixture in a bag or container. Put it in the trash. NOTE: This sheet is a summary. It may not cover all possible information. If you have questions about this medicine, talk to your doctor, pharmacist, or health care provider.  2023 Elsevier/Gold Standard (2021-11-09 00:00:00)

## 2022-12-05 NOTE — Progress Notes (Signed)
PATIENT: Devin Vance DOB: 1927-10-20  REASON FOR VISIT: follow up HISTORY FROM: family  12/05/2022: There has been behavior changes. He has enjoyed his class twice a week for parkinson's patient, lately he wants to just sleep or get bothered to go to class. He sleeps all night long but wakes at night for incontinence. Not wandering at night or in the daytime. Sleeps most of the night, goes to bed at 11-12, falls asleep before that in his chair, ongoing at least a year, no snoring, he wouldn't use it and sleeps on a recliner. We discussed pros and cons of starting modafinil to try and keep him more alert during the day. Wife provides most information as well as daughter.  Patient complains of symptoms per HPI as well as the following symptoms: daytime somnolence . Pertinent negatives and positives per HPI. All others negative    05/31/2022: he is stable, they had a pcr test because of fatigue and he was negative through covid. One small fall and it was mechanical he tripped over something the EMTs checked him out and helped get him off the florr, eating well, a little bit more confused lately, he will doze off sometimes, he has a kitty he loves, behavior has been fine, no need to change. Depakote was stopped in the past. Doing well on low dose seroquel. Here with wife and daughter who provide all information.  Patient complains of symptoms per HPI as well as the following symptoms: alzheimer's disease . Pertinent negatives and positives per HPI. All others negative   11/19/2021: I am meeting this patient with progressive dementia for the first time today.  He is here with his wife and daughter who provide most information.  Extended visit today.  We spoke about patient's history, as a summary he is a 89 year old male with a history of progressive Alzheimer's disease.  Due to his Alzheimer's disease, he has parkinsonian features (not Parkinson's disease), respite all was tried in the past  for behavioral issues as well as Depakote but those were discontinued, he is on Namenda, he has been doing well, remains on Seroquel at bedtime, no falls, he uses a walker although he did not bring it today.  Per his family, eating well, weight is stable, no problems with swallowing and choking, they monitor his size of his bites, he was agitated at one time but now there is no agitation. He is due for a checkup in his teeth, he has a runny nose. No recent falls. He takes a boxing class with patient with parkinsonism. [facility] They got the hospital bed but doesn't use it and they have a lift chair that helps. He can't get in and out of it. She has PT in the building, needs help with getting in and out of the hospital bed, we can get someone in the house to help however they declined, and have a nurse coming out on the 14th and they will discuss that with her. Overall they are doing well and today we reviewed history and answered all questions from family.   HISTORY OF PRESENT ILLNESS: Today 12/06/22 Devin Vance is a 89 year old male with history of progressive dementing illness consistent with Alzheimer's disease.  He has had parkinsonian features with Risperdal, thus discontinued.  He is on Namenda.  His wife has called reporting more agitation with his aide, Depakote was increased 500 mg at bedtime, took  to ER for AMS, Depakote level was 47. Has remained off since, mood has been doing well.  Is not sleeping as much during the day.  Remains on Seroquel 25 mg at bedtime.  Sleeps fairly well, urinary frequency does wake him up.  No falls, his balance has been good.  He uses a walker, didn't bring it today, too heavy for his wife to lift. Wife gets frustrated when he isn't active enough or sits down to wait for elevator instead of standing. Loves doing chair boxing. The aide has been back and all is well. His wife had eye surgery last week, doing well.  Here  today for evaluation accompanied by his wife, and daughter Devin Vance.  In ER, negative for UTI, CBC showed HGB 10.7, platelets 145, creatinine 1.53.   Update 10/25/2020 SS: Devin Vance is a 89 year old male with history of progressive dementing illness consistent with Alzheimer's disease.  He is on Depakote and Seroquel for episodes of agitation.  He had Parkinson features with Risperdal, walking improved some with Risperdal discontinuation.  Remains overall stable, continues to have some decline in memory, has trouble remembering his children's names.  His mood is overall good, no episodes of agitation or hallucinations.  He is walking with a walker, does well with this.  They reside in a senior living apartment, he will go down to the meal hall and bring back their meals.  He has trouble sleeping, partly due to incontinence issues, does have daytime drowsiness.  He has periods of constipation followed by diarrhea.  His wife thinks he could benefit more from trazodone, which she was previously on.  Does a few days a week of chair boxing class.  Participates in [place] dominoes with other residents that requires math skills.  Presents today for evaluation accompanied by his wife Devin Vance.  HISTORY  04/21/2020 Dr. Anne Hahn: Devin Vance is a 89 year old right-handed white male with a history of a progressive dementing illness consistent with Alzheimer's disease.  The patient has had episodes of agitation in the past, he is currently on Depakote 500 mg at night and Seroquel 25 mg at night.  The wife indicates that he sleeps well at night but also sleeps all day long.  He is minimally active, he has not had any hallucinations or agitation.  He walks with a walker for longer distances.  He did have some parkinsonian features with Risperdal, his walking has improved some stopping the medication but he still take short shuffling steps.  He recently was reduced on his metoprolol dose due to some reduction in blood pressures.  He returns  for an evaluation.  REVIEW OF SYSTEMS: Out of a complete 14 system review of symptoms, the patient complains only of the following symptoms, and all other reviewed systems are negative.  Memory loss, walking difficulty.  ALLERGIES: Allergies  Allergen Reactions   Lorazepam Other (See Comments)    Generalized weakness    HOME MEDICATIONS: Outpatient Medications Prior to Visit  Medication Sig Dispense Refill   aspirin EC 81 MG tablet Take by mouth.     Cholecalciferol (VITAMIN D-3 PO) Take 1,000 units of lipase by mouth.     Cyanocobalamin (VITAMIN B12) 1000 MCG TBCR 2,500 mcg daily.     Fluticasone Propionate (FLONASE ALLERGY RELIEF NA) Place into the nose.     hydroxychloroquine (PLAQUENIL) 200 MG tablet Take 200 mg by mouth daily. One pill 200mg  every other day , other days two pills     ketoconazole (NIZORAL) 2 %  shampoo      Lactobacillus (PROBIOTIC ACIDOPHILUS PO) Take 1 tablet by mouth daily.     levothyroxine (SYNTHROID) 25 MCG tablet 1 tablet in the morning on an empty stomach     memantine (NAMENDA) 10 MG tablet Take 1 tablet (10 mg total) by mouth 2 (two) times daily. 180 tablet 4   Multiple Vitamin (MULTIVITAMIN) capsule Take by mouth.     Omega-3 Fatty Acids (FISH OIL) 1200 MG CAPS Take by mouth.     QUEtiapine (SEROQUEL) 25 MG tablet Take 1 tablet (25 mg total) by mouth at bedtime. 90 tablet 4   metoprolol succinate (TOPROL-XL) 25 MG 24 hr tablet Take 12.5 mg by mouth.      albuterol (VENTOLIN HFA) 108 (90 Base) MCG/ACT inhaler Inhale 2 puffs into the lungs every 4 (four) hours as needed for wheezing or shortness of breath. (Patient not taking: Reported on 12/05/2022) 18 g 0   CALCIUM CITRATE PO Take 300 mg by mouth. (Patient not taking: Reported on 12/05/2022)     Ferrous Sulfate (IRON PO) Take 325 mg by mouth daily. (Patient not taking: Reported on 12/05/2022)     No facility-administered medications prior to visit.    PAST MEDICAL HISTORY: Past Medical History:   Diagnosis Date   Alzheimer disease (HCC) 07/31/2018   Angina pectoris (HCC)    Arthritis    Cardiomyopathy (HCC)    COPD (chronic obstructive pulmonary disease) (HCC)    Gastroesophageal reflux disease    History of colon polyps    Hyperlipidemia    Hypertension    Obesity    Osteoporosis    Polymyalgia rheumatica (HCC)    Prostate cancer (HCC)    Rheumatoid arthritis (HCC)    Secondary Parkinson disease (HCC) 08/13/2019   Sleep apnea     PAST SURGICAL HISTORY: Past Surgical History:  Procedure Laterality Date   CATARACT EXTRACTION     CORONARY ANGIOPLASTY     HEMORRHOIDECTOMY WITH HEMORRHOID BANDING     PROSTATECTOMY     STRABISMUS SURGERY     TONSILLECTOMY      FAMILY HISTORY: Family History  Problem Relation Age of Onset   Stroke Mother    Dementia Mother    Cancer Father    Dementia Sister    Dementia Maternal Aunt     SOCIAL HISTORY: Social History   Socioeconomic History   Marital status: Married    Spouse name: Not on file   Number of children: Not on file   Years of education: Not on file   Highest education level: Doctorate  Occupational History   Not on file  Tobacco Use   Smoking status: Never   Smokeless tobacco: Never  Substance and Sexual Activity   Alcohol use: Not Currently   Drug use: Not Currently   Sexual activity: Not on file  Other Topics Concern   Not on file  Social History Narrative   Lives at [facility] with his wife in independent living   Social Determinants of Health   Financial Resource Strain: Not on file  Food Insecurity: Not on file  Transportation Needs: Not on file  Physical Activity: Not on file  Stress: Not on file  Social Connections: Not on file  Intimate Partner Violence: Not on file   PHYSICAL EXAM  Vitals:   12/05/22 1314  BP: 132/75  Pulse: 70  Weight: 201 lb 3.2 oz (91.3 kg)  Height: 5\' 10"  (1.778 m)    Body mass index is 28.87 kg/m.  Patient is in no acute distress, 89 year old male  elderly, well dressed and well groomed, he is pleasant, very quiet, appears slightly frail, he follows most 1 or 2 step commands but has difficulty with more complex instructions his speech and language are fluent, no aphasia, hypophonia, pupils pinpoint could not visualize fundi due to small pupils, pupils equal and symmetrical, extraocular movements were full except possibly some decrease in elevation, visual fields full to threat in all quadrants, difficult due to his advanced dementia to perform a detailed exam but all extremities are antigravity without focal deficits, no tremor was noted at rest he does have a slight postural and action tremor, he has a stooped posture, low clearance, bradykinesia, narrow gait and short steps, no significant imbalance noticed using a walker.      12/10/2019    2:45 PM 07/15/2019   12:42 PM 07/31/2018    2:54 PM  MMSE - Mini Mental State Exam  Not completed: Unable to complete    Orientation to time 0 3 4  Orientation to Place  0 3  Registration  3 3  Attention/ Calculation  0 5  Recall  0 0  Language- name 2 objects  2 2  Language- repeat  0 1  Language- follow 3 step command  2 2  Language- follow 3 step command-comments  he held the paper in the air after he folded it   Language- read & follow direction  1 1  Write a sentence  1 1  Copy design  0 1  Total score  12 23    DIAGNOSTIC DATA (LABS, IMAGING, TESTING) - I reviewed patient records, labs, notes, testing and imaging myself where available.  Lab Results  Component Value Date   WBC 5.6 02/16/2021   HGB 10.7 (L) 02/16/2021   HCT 34.3 (L) 02/16/2021   MCV 93.5 02/16/2021   PLT 145 (L) 02/16/2021      Component Value Date/Time   NA 141 02/16/2021 1334   NA 144 07/15/2019 1407   NA 145 04/09/2014 1759   K 4.1 02/16/2021 1334   K 4.2 04/09/2014 1759   CL 108 02/16/2021 1334   CL 112 (H) 04/09/2014 1759   CO2 28 02/16/2021 1334   CO2 27 04/09/2014 1759   GLUCOSE 85 02/16/2021 1334    GLUCOSE 117 (H) 04/09/2014 1759   BUN 24 (H) 02/16/2021 1334   BUN 35 (H) 07/15/2019 1407   BUN 30 (H) 04/09/2014 1759   CREATININE 1.53 (H) 02/16/2021 1334   CREATININE 1.61 (H) 04/09/2014 1759   CALCIUM 8.5 (L) 02/16/2021 1334   CALCIUM 8.6 04/09/2014 1759   PROT 5.0 (L) 02/16/2021 1334   PROT 5.9 (L) 07/15/2019 1407   PROT 6.8 04/09/2014 1759   ALBUMIN 2.9 (L) 02/16/2021 1334   ALBUMIN 4.0 07/15/2019 1407   ALBUMIN 3.5 04/09/2014 1759   AST 17 02/16/2021 1334   AST 34 04/09/2014 1759   ALT 14 02/16/2021 1334   ALT 35 04/09/2014 1759   ALKPHOS 41 02/16/2021 1334   ALKPHOS 62 04/09/2014 1759   BILITOT 0.3 02/16/2021 1334   BILITOT 0.3 07/15/2019 1407   BILITOT 0.3 04/09/2014 1759   GFRNONAA 43 (L) 02/16/2021 1334   GFRNONAA 39 (L) 04/09/2014 1759   GFRAA 47 (L) 07/15/2019 1407   GFRAA 45 (L) 04/09/2014 1759   Lab Results  Component Value Date   CHOL 114 03/22/2014   HDL 38 (L) 03/22/2014   LDLCALC 61 03/22/2014   TRIG 76  03/22/2014   No results found for: "HGBA1C" No results found for: "VITAMINB12" Lab Results  Component Value Date   TSH 4.69 (H) 03/22/2014    ASSESSMENT AND PLAN 89 y.o. year old male  has a past medical history of Alzheimer disease (HCC) (07/31/2018), Angina pectoris (HCC), Arthritis, Cardiomyopathy (HCC), COPD (chronic obstructive pulmonary disease) (HCC), Gastroesophageal reflux disease, History of colon polyps, Hyperlipidemia, Hypertension, Obesity, Osteoporosis, Polymyalgia rheumatica (HCC), Prostate cancer (HCC), Rheumatoid arthritis (HCC), Secondary Parkinson disease (HCC) (08/13/2019), and Sleep apnea. here with: Alzheimer's disease and parkinsonism secondary to Alzheimer's disease.   Excessive daytime somnolence: We discussed pros and cons of starting modafinil to try and keep him more alert during the day. Wife provides most information as well as daughter. Start Modafinil  -Had side effects to Risperdal and Depakote, continue Seroquel  and Namenda.  If needed may increase Seroquel in the future.  At this time he is stable, will continue current management.  -Impaired cognition, advanced dementia, last MMSE in 2020 was 12 out of 30, unable to complete anymore, we will not perform MMSE's in the future.  -Answered all questions, discussed history with wife and daughter.  Answered all questions again today, wife and daughter are happy with his stability, we will follow-up with him again in 6 months, lovely family and they are welcome to call if anything should happen, we did discuss appropriate fluid intake, that if patient acutely declines likely medical as opposed to dementia and to ensure he has a medical evaluation looking for infectious or toxic metabolic etiologies first.  Naomie Dean, MD [facility] [address], [address] [place], [place] [address] [phone]  I spent over 30 minutes of face-to-face and non-face-to-face time with patient on the  1. Severe late onset Alzheimer's dementia with other behavioral disturbance (HCC)      diagnosis.  This included previsit chart review, lab review, study review, order entry, electronic health record documentation, patient education on the different diagnostic and therapeutic options, counseling and coordination of care, risks and benefits of management, compliance, or risk factor reduction

## 2022-12-10 DIAGNOSIS — I10 Essential (primary) hypertension: Secondary | ICD-10-CM | POA: Diagnosis not present

## 2022-12-10 DIAGNOSIS — N1831 Chronic kidney disease, stage 3a: Secondary | ICD-10-CM | POA: Diagnosis not present

## 2022-12-10 DIAGNOSIS — R7303 Prediabetes: Secondary | ICD-10-CM | POA: Diagnosis not present

## 2022-12-10 DIAGNOSIS — J449 Chronic obstructive pulmonary disease, unspecified: Secondary | ICD-10-CM | POA: Diagnosis not present

## 2022-12-10 DIAGNOSIS — I2510 Atherosclerotic heart disease of native coronary artery without angina pectoris: Secondary | ICD-10-CM | POA: Diagnosis not present

## 2022-12-10 DIAGNOSIS — E785 Hyperlipidemia, unspecified: Secondary | ICD-10-CM | POA: Diagnosis not present

## 2022-12-10 DIAGNOSIS — I700 Atherosclerosis of aorta: Secondary | ICD-10-CM | POA: Diagnosis not present

## 2022-12-10 DIAGNOSIS — Z8546 Personal history of malignant neoplasm of prostate: Secondary | ICD-10-CM | POA: Diagnosis not present

## 2022-12-10 DIAGNOSIS — G309 Alzheimer's disease, unspecified: Secondary | ICD-10-CM | POA: Diagnosis not present

## 2022-12-10 DIAGNOSIS — M069 Rheumatoid arthritis, unspecified: Secondary | ICD-10-CM | POA: Diagnosis not present

## 2022-12-10 DIAGNOSIS — E039 Hypothyroidism, unspecified: Secondary | ICD-10-CM | POA: Diagnosis not present

## 2022-12-10 DIAGNOSIS — I779 Disorder of arteries and arterioles, unspecified: Secondary | ICD-10-CM | POA: Diagnosis not present

## 2023-01-02 ENCOUNTER — Emergency Department (HOSPITAL_COMMUNITY): Payer: Medicare Other

## 2023-01-02 ENCOUNTER — Other Ambulatory Visit: Payer: Self-pay

## 2023-01-02 ENCOUNTER — Inpatient Hospital Stay (HOSPITAL_COMMUNITY)
Admission: EM | Admit: 2023-01-02 | Discharge: 2023-01-05 | DRG: 178 | Disposition: A | Discharge status: Home Health | Payer: Medicare Other | Attending: Internal Medicine | Admitting: Internal Medicine

## 2023-01-02 ENCOUNTER — Encounter (HOSPITAL_COMMUNITY): Payer: Self-pay

## 2023-01-02 DIAGNOSIS — Z888 Allergy status to other drugs, medicaments and biological substances status: Secondary | ICD-10-CM | POA: Diagnosis not present

## 2023-01-02 DIAGNOSIS — N1832 Chronic kidney disease, stage 3b: Secondary | ICD-10-CM | POA: Diagnosis present

## 2023-01-02 DIAGNOSIS — Z79890 Hormone replacement therapy (postmenopausal): Secondary | ICD-10-CM | POA: Diagnosis not present

## 2023-01-02 DIAGNOSIS — I2510 Atherosclerotic heart disease of native coronary artery without angina pectoris: Secondary | ICD-10-CM | POA: Diagnosis present

## 2023-01-02 DIAGNOSIS — K8020 Calculus of gallbladder without cholecystitis without obstruction: Secondary | ICD-10-CM | POA: Diagnosis not present

## 2023-01-02 DIAGNOSIS — F03918 Unspecified dementia, unspecified severity, with other behavioral disturbance: Secondary | ICD-10-CM | POA: Diagnosis present

## 2023-01-02 DIAGNOSIS — Z820 Family history of epilepsy and other diseases of the nervous system: Secondary | ICD-10-CM

## 2023-01-02 DIAGNOSIS — Z7982 Long term (current) use of aspirin: Secondary | ICD-10-CM

## 2023-01-02 DIAGNOSIS — J449 Chronic obstructive pulmonary disease, unspecified: Secondary | ICD-10-CM | POA: Diagnosis present

## 2023-01-02 DIAGNOSIS — G219 Secondary parkinsonism, unspecified: Secondary | ICD-10-CM | POA: Diagnosis present

## 2023-01-02 DIAGNOSIS — K529 Noninfective gastroenteritis and colitis, unspecified: Secondary | ICD-10-CM | POA: Diagnosis present

## 2023-01-02 DIAGNOSIS — G4730 Sleep apnea, unspecified: Secondary | ICD-10-CM | POA: Diagnosis present

## 2023-01-02 DIAGNOSIS — K5730 Diverticulosis of large intestine without perforation or abscess without bleeding: Secondary | ICD-10-CM | POA: Diagnosis not present

## 2023-01-02 DIAGNOSIS — I129 Hypertensive chronic kidney disease with stage 1 through stage 4 chronic kidney disease, or unspecified chronic kidney disease: Secondary | ICD-10-CM | POA: Diagnosis present

## 2023-01-02 DIAGNOSIS — M1990 Unspecified osteoarthritis, unspecified site: Secondary | ICD-10-CM | POA: Diagnosis present

## 2023-01-02 DIAGNOSIS — R531 Weakness: Secondary | ICD-10-CM

## 2023-01-02 DIAGNOSIS — J9811 Atelectasis: Secondary | ICD-10-CM | POA: Diagnosis not present

## 2023-01-02 DIAGNOSIS — R4182 Altered mental status, unspecified: Secondary | ICD-10-CM

## 2023-01-02 DIAGNOSIS — K3189 Other diseases of stomach and duodenum: Secondary | ICD-10-CM | POA: Diagnosis present

## 2023-01-02 DIAGNOSIS — K219 Gastro-esophageal reflux disease without esophagitis: Secondary | ICD-10-CM | POA: Diagnosis present

## 2023-01-02 DIAGNOSIS — M810 Age-related osteoporosis without current pathological fracture: Secondary | ICD-10-CM | POA: Diagnosis present

## 2023-01-02 DIAGNOSIS — F02818 Dementia in other diseases classified elsewhere, unspecified severity, with other behavioral disturbance: Secondary | ICD-10-CM | POA: Diagnosis present

## 2023-01-02 DIAGNOSIS — Z823 Family history of stroke: Secondary | ICD-10-CM

## 2023-01-02 DIAGNOSIS — Z79899 Other long term (current) drug therapy: Secondary | ICD-10-CM | POA: Diagnosis not present

## 2023-01-02 DIAGNOSIS — I714 Abdominal aortic aneurysm, without rupture, unspecified: Secondary | ICD-10-CM | POA: Diagnosis present

## 2023-01-02 DIAGNOSIS — Z8546 Personal history of malignant neoplasm of prostate: Secondary | ICD-10-CM | POA: Diagnosis not present

## 2023-01-02 DIAGNOSIS — M069 Rheumatoid arthritis, unspecified: Secondary | ICD-10-CM | POA: Diagnosis present

## 2023-01-02 DIAGNOSIS — I10 Essential (primary) hypertension: Secondary | ICD-10-CM | POA: Diagnosis present

## 2023-01-02 DIAGNOSIS — R079 Chest pain, unspecified: Secondary | ICD-10-CM | POA: Diagnosis not present

## 2023-01-02 DIAGNOSIS — I429 Cardiomyopathy, unspecified: Secondary | ICD-10-CM | POA: Diagnosis present

## 2023-01-02 DIAGNOSIS — Z809 Family history of malignant neoplasm, unspecified: Secondary | ICD-10-CM

## 2023-01-02 DIAGNOSIS — R0789 Other chest pain: Secondary | ICD-10-CM | POA: Diagnosis not present

## 2023-01-02 DIAGNOSIS — M545 Low back pain, unspecified: Secondary | ICD-10-CM | POA: Diagnosis not present

## 2023-01-02 DIAGNOSIS — R509 Fever, unspecified: Secondary | ICD-10-CM | POA: Diagnosis not present

## 2023-01-02 DIAGNOSIS — G309 Alzheimer's disease, unspecified: Secondary | ICD-10-CM | POA: Diagnosis present

## 2023-01-02 DIAGNOSIS — U071 COVID-19: Secondary | ICD-10-CM | POA: Diagnosis not present

## 2023-01-02 DIAGNOSIS — R109 Unspecified abdominal pain: Secondary | ICD-10-CM | POA: Diagnosis not present

## 2023-01-02 DIAGNOSIS — M353 Polymyalgia rheumatica: Secondary | ICD-10-CM | POA: Diagnosis present

## 2023-01-02 DIAGNOSIS — N183 Chronic kidney disease, stage 3 unspecified: Secondary | ICD-10-CM | POA: Diagnosis present

## 2023-01-02 DIAGNOSIS — E785 Hyperlipidemia, unspecified: Secondary | ICD-10-CM | POA: Diagnosis present

## 2023-01-02 DIAGNOSIS — M549 Dorsalgia, unspecified: Secondary | ICD-10-CM | POA: Diagnosis not present

## 2023-01-02 DIAGNOSIS — R0902 Hypoxemia: Secondary | ICD-10-CM | POA: Diagnosis present

## 2023-01-02 LAB — URINALYSIS, W/ REFLEX TO CULTURE (INFECTION SUSPECTED)
Bacteria, UA: NONE SEEN
Bilirubin Urine: NEGATIVE
Glucose, UA: NEGATIVE mg/dL
Hgb urine dipstick: NEGATIVE
Ketones, ur: NEGATIVE mg/dL
Leukocytes,Ua: NEGATIVE
Nitrite: NEGATIVE
Protein, ur: 30 mg/dL — AB
Specific Gravity, Urine: 1.021 (ref 1.005–1.030)
pH: 5 (ref 5.0–8.0)

## 2023-01-02 LAB — I-STAT CHEM 8, ED
BUN: 24 mg/dL — ABNORMAL HIGH (ref 8–23)
Calcium, Ion: 1.16 mmol/L (ref 1.15–1.40)
Chloride: 103 mmol/L (ref 98–111)
Creatinine, Ser: 1.7 mg/dL — ABNORMAL HIGH (ref 0.61–1.24)
Glucose, Bld: 114 mg/dL — ABNORMAL HIGH (ref 70–99)
HCT: 37 % — ABNORMAL LOW (ref 39.0–52.0)
Hemoglobin: 12.6 g/dL — ABNORMAL LOW (ref 13.0–17.0)
Potassium: 4.3 mmol/L (ref 3.5–5.1)
Sodium: 139 mmol/L (ref 135–145)
TCO2: 27 mmol/L (ref 22–32)

## 2023-01-02 LAB — CBC WITH DIFFERENTIAL/PLATELET
Abs Immature Granulocytes: 0.03 10*3/uL (ref 0.00–0.07)
Basophils Absolute: 0.1 10*3/uL (ref 0.0–0.1)
Basophils Relative: 1 %
Eosinophils Absolute: 0 10*3/uL (ref 0.0–0.5)
Eosinophils Relative: 0 %
HCT: 37.9 % — ABNORMAL LOW (ref 39.0–52.0)
Hemoglobin: 12.1 g/dL — ABNORMAL LOW (ref 13.0–17.0)
Immature Granulocytes: 0 %
Lymphocytes Relative: 4 %
Lymphs Abs: 0.4 10*3/uL — ABNORMAL LOW (ref 0.7–4.0)
MCH: 30 pg (ref 26.0–34.0)
MCHC: 31.9 g/dL (ref 30.0–36.0)
MCV: 93.8 fL (ref 80.0–100.0)
Monocytes Absolute: 0.6 10*3/uL (ref 0.1–1.0)
Monocytes Relative: 7 %
Neutro Abs: 8.4 10*3/uL — ABNORMAL HIGH (ref 1.7–7.7)
Neutrophils Relative %: 88 %
Platelets: 214 10*3/uL (ref 150–400)
RBC: 4.04 MIL/uL — ABNORMAL LOW (ref 4.22–5.81)
RDW: 13.2 % (ref 11.5–15.5)
WBC: 9.6 10*3/uL (ref 4.0–10.5)
nRBC: 0 % (ref 0.0–0.2)

## 2023-01-02 LAB — COMPREHENSIVE METABOLIC PANEL
ALT: 73 U/L — ABNORMAL HIGH (ref 0–44)
AST: 24 U/L (ref 15–41)
Albumin: 3.7 g/dL (ref 3.5–5.0)
Alkaline Phosphatase: 76 U/L (ref 38–126)
Anion gap: 5 (ref 5–15)
BUN: 24 mg/dL — ABNORMAL HIGH (ref 8–23)
CO2: 26 mmol/L (ref 22–32)
Calcium: 8.7 mg/dL — ABNORMAL LOW (ref 8.9–10.3)
Chloride: 105 mmol/L (ref 98–111)
Creatinine, Ser: 1.49 mg/dL — ABNORMAL HIGH (ref 0.61–1.24)
GFR, Estimated: 43 mL/min — ABNORMAL LOW (ref >60)
Glucose, Bld: 116 mg/dL — ABNORMAL HIGH (ref 70–99)
Potassium: 4.2 mmol/L (ref 3.5–5.1)
Sodium: 136 mmol/L (ref 135–145)
Total Bilirubin: 0.4 mg/dL (ref 0.3–1.2)
Total Protein: 6.6 g/dL (ref 6.5–8.1)

## 2023-01-02 LAB — RESP PANEL BY RT-PCR (RSV, FLU A&B, COVID)  RVPGX2
Influenza A by PCR: NEGATIVE
Influenza B by PCR: NEGATIVE
Resp Syncytial Virus by PCR: NEGATIVE
SARS Coronavirus 2 by RT PCR: POSITIVE — AB

## 2023-01-02 LAB — APTT: aPTT: 28 seconds (ref 24–36)

## 2023-01-02 LAB — PROTIME-INR
INR: 1.1 (ref 0.8–1.2)
Prothrombin Time: 14.1 seconds (ref 11.4–15.2)

## 2023-01-02 LAB — LACTIC ACID, PLASMA: Lactic Acid, Venous: 1.1 mmol/L (ref 0.5–1.9)

## 2023-01-02 MED ORDER — METOPROLOL SUCCINATE ER 25 MG PO TB24
12.5000 mg | ORAL_TABLET | Freq: Every day | ORAL | Status: DC
  Administered 2023-01-02 – 2023-01-04 (×2): 12.5 mg via ORAL
  Filled 2023-01-02 (×3): qty 1

## 2023-01-02 MED ORDER — LEVOTHYROXINE SODIUM 25 MCG PO TABS
25.0000 ug | ORAL_TABLET | Freq: Every day | ORAL | Status: DC
  Administered 2023-01-03 – 2023-01-05 (×3): 25 ug via ORAL
  Filled 2023-01-02 (×3): qty 1

## 2023-01-02 MED ORDER — SODIUM CHLORIDE 0.9 % IV SOLN: 200.0000 mg | Freq: Once | INTRAVENOUS | Status: DC

## 2023-01-02 MED ORDER — ACETAMINOPHEN 325 MG PO TABS: 650.0000 mg | ORAL_TABLET | Freq: Four times a day (QID) | ORAL | Status: DC | PRN

## 2023-01-02 MED ORDER — ONDANSETRON HCL 4 MG PO TABS: 4.0000 mg | ORAL_TABLET | Freq: Four times a day (QID) | ORAL | Status: DC | PRN

## 2023-01-02 MED ORDER — MODAFINIL 200 MG PO TABS
100.0000 mg | ORAL_TABLET | Freq: Every day | ORAL | Status: DC
  Administered 2023-01-03 – 2023-01-05 (×3): 100 mg via ORAL
  Filled 2023-01-02 (×3): qty 1

## 2023-01-02 MED ORDER — HYDROXYCHLOROQUINE SULFATE 200 MG PO TABS
200.0000 mg | ORAL_TABLET | ORAL | Status: DC
  Administered 2023-01-02 – 2023-01-04 (×2): 200 mg via ORAL
  Filled 2023-01-02 (×2): qty 1

## 2023-01-02 MED ORDER — ACETAMINOPHEN 650 MG RE SUPP: 650.0000 mg | Freq: Four times a day (QID) | RECTAL | Status: DC | PRN

## 2023-01-02 MED ORDER — QUETIAPINE FUMARATE 25 MG PO TABS
25.0000 mg | ORAL_TABLET | Freq: Every day | ORAL | Status: DC
  Administered 2023-01-02 – 2023-01-04 (×3): 25 mg via ORAL
  Filled 2023-01-02 (×3): qty 1

## 2023-01-02 MED ORDER — ADULT MULTIVITAMIN W/MINERALS CH
1.0000 | ORAL_TABLET | Freq: Every day | ORAL | Status: DC
  Administered 2023-01-03 – 2023-01-05 (×3): 1 via ORAL
  Filled 2023-01-02 (×4): qty 1

## 2023-01-02 MED ORDER — ASPIRIN 81 MG PO TBEC
81.0000 mg | DELAYED_RELEASE_TABLET | Freq: Every day | ORAL | Status: DC
  Administered 2023-01-02 – 2023-01-05 (×4): 81 mg via ORAL
  Filled 2023-01-02 (×4): qty 1

## 2023-01-02 MED ORDER — DEXAMETHASONE SODIUM PHOSPHATE 10 MG/ML IJ SOLN
6.0000 mg | INTRAMUSCULAR | Status: DC
  Administered 2023-01-02: 6 mg via INTRAVENOUS
  Filled 2023-01-02: qty 1

## 2023-01-02 MED ORDER — MEMANTINE HCL 10 MG PO TABS
10.0000 mg | ORAL_TABLET | Freq: Two times a day (BID) | ORAL | Status: DC
  Administered 2023-01-02 – 2023-01-05 (×6): 10 mg via ORAL
  Filled 2023-01-02 (×7): qty 1

## 2023-01-02 MED ORDER — SODIUM CHLORIDE 0.9 % IV SOLN: 100.0000 mg | Freq: Every day | INTRAVENOUS | Status: DC

## 2023-01-02 MED ORDER — NIRMATRELVIR/RITONAVIR (PAXLOVID) TABLET (RENAL DOSING)
2.0000 | ORAL_TABLET | Freq: Two times a day (BID) | ORAL | Status: DC
  Administered 2023-01-02 – 2023-01-05 (×7): 2 via ORAL
  Filled 2023-01-02: qty 20

## 2023-01-02 MED ORDER — HYDROXYCHLOROQUINE SULFATE 200 MG PO TABS
400.0000 mg | ORAL_TABLET | ORAL | Status: DC
  Administered 2023-01-03 – 2023-01-05 (×2): 400 mg via ORAL
  Filled 2023-01-02 (×2): qty 2

## 2023-01-02 MED ORDER — CYANOCOBALAMIN 500 MCG PO TABS
2500.0000 ug | ORAL_TABLET | Freq: Every day | ORAL | Status: DC
  Administered 2023-01-02 – 2023-01-05 (×4): 2500 ug via ORAL
  Filled 2023-01-02 (×4): qty 5

## 2023-01-02 MED ORDER — ENOXAPARIN SODIUM 40 MG/0.4ML IJ SOSY
40.0000 mg | PREFILLED_SYRINGE | INTRAMUSCULAR | Status: DC
  Administered 2023-01-02 – 2023-01-04 (×3): 40 mg via SUBCUTANEOUS
  Filled 2023-01-02 (×3): qty 0.4

## 2023-01-02 MED ORDER — MODAFINIL 200 MG PO TABS
100.0000 mg | ORAL_TABLET | Freq: Every day | ORAL | Status: DC
  Filled 2023-01-02: qty 1

## 2023-01-02 MED ORDER — PANTOPRAZOLE SODIUM 40 MG PO TBEC
40.0000 mg | DELAYED_RELEASE_TABLET | Freq: Every day | ORAL | Status: DC
  Administered 2023-01-02 – 2023-01-05 (×4): 40 mg via ORAL
  Filled 2023-01-02 (×4): qty 1

## 2023-01-02 MED ORDER — ONDANSETRON HCL 4 MG/2ML IJ SOLN: 4.0000 mg | Freq: Four times a day (QID) | INTRAMUSCULAR | Status: DC | PRN

## 2023-01-02 NOTE — ED Notes (Signed)
Attempted to get urine sample. Patient unable to provide one at this time. Male purewick placed.

## 2023-01-02 NOTE — ED Provider Notes (Addendum)
[place] Provider Note   CSN: [id number] Arrival date & time: 01/02/23  0117     History  Chief Complaint  Patient presents with   Cough   Diarrhea        Weakness    ARIYAN Vance is a 89 y.o. male.  The history is provided by the patient, medical records and the spouse.  Cough Diarrhea Weakness Associated symptoms: cough and diarrhea   Devin Vance is a 89 y.o. male who presents to the Emergency Department complaining of altered mental status.  Level 5 caveat due to dementia.  History is provided by patient's wife at the bedside, EMS.  He presents to the emergency department by EMS from [facility] for change in mental status and significant weakness.  He started to get sick about 2 days ago with cough and diarrhea.  This evening he developed a fever to 101.  His wife was attempting to get him to the commode and he was no longer able to ambulate due to profound generalized weakness.  He also complained of some back pain.  He does use a walker to ambulate and typically ambulates independently.  No recent falls.  He does have a history of dementia and wife states that he is more confused than his baseline.  She does state that there is COVID at their current facility.  He did have a COVID test at home today that was negative.      Home Medications Prior to Admission medications   Medication Sig Start Date End Date Taking? Authorizing Provider  aspirin EC 81 MG tablet Take by mouth.    [provider]  Cholecalciferol (VITAMIN D-3 PO) Take 1,000 units of lipase by mouth.    [provider]  Cyanocobalamin (VITAMIN B12) 1000 MCG TBCR 2,500 mcg daily.    [provider]  Fluticasone Propionate (FLONASE ALLERGY RELIEF NA) Place into the nose.    [provider]  hydroxychloroquine (PLAQUENIL) 200 MG tablet Take 200 mg by mouth daily. One pill 200mg  every other day , other days two pills 06/11/19    [provider]  ketoconazole (NIZORAL) 2 % shampoo  02/13/19   [provider]  Lactobacillus (PROBIOTIC ACIDOPHILUS PO) Take 1 tablet by mouth daily.    [provider]  levothyroxine (SYNTHROID) 25 MCG tablet 1 tablet in the morning on an empty stomach 04/29/20   [provider]  memantine (NAMENDA) 10 MG tablet Take 1 tablet (10 mg total) by mouth 2 (two) times daily. 05/31/22   [name], MD  metoprolol succinate (TOPROL-XL) 25 MG 24 hr tablet Take 12.5 mg by mouth.  08/20/16 05/31/22  [provider]  modafinil (PROVIGIL) 100 MG tablet Take 1 tablet (100 mg total) by mouth daily. 12/05/22   [name], MD  Multiple Vitamin (MULTIVITAMIN) capsule Take by mouth.    [provider]  Omega-3 Fatty Acids (FISH OIL) 1200 MG CAPS Take by mouth.    [provider]  QUEtiapine (SEROQUEL) 25 MG tablet Take 1 tablet (25 mg total) by mouth at bedtime. 05/31/22   [name], MD      Allergies    Lorazepam    Review of Systems   Review of Systems  Respiratory:  Positive for cough.   Gastrointestinal:  Positive for diarrhea.  Neurological:  Positive for weakness.  All other systems reviewed and are negative.   Physical Exam Updated Vital  Signs BP (!) 137/97   Pulse 70   Temp 99.7 F (37.6 C)   Resp 18   Ht 5\' 10"  (1.778 m)   Wt 91.3 kg   SpO2 94%   BMI 28.88 kg/m  Physical Exam Vitals and nursing note reviewed.  Constitutional:      Appearance: He is well-developed.  HENT:     Head: Normocephalic and atraumatic.  Cardiovascular:     Rate and Rhythm: Normal rate and regular rhythm.     Heart sounds: No murmur heard. Pulmonary:     Effort: Pulmonary effort is normal. No respiratory distress.     Breath sounds: Normal breath sounds.  Abdominal:     Palpations: Abdomen is soft.     Tenderness: There is no abdominal tenderness. There is no guarding or rebound.  Musculoskeletal:        General: No  tenderness.  Skin:    General: Skin is warm and dry.  Neurological:     Mental Status: He is alert.     Comments: Oriented to person, hospital.  Disoriented to time.  Global weakness.  Visual fields grossly intact.  Psychiatric:        Behavior: Behavior normal.     ED Results / Procedures / Treatments   Labs (all labs ordered are listed, but only abnormal results are displayed) Labs Reviewed  RESP PANEL BY RT-PCR (RSV, FLU A&B, COVID)  RVPGX2 - Abnormal; Notable for the following components:      Result Value   SARS Coronavirus 2 by RT PCR POSITIVE (*)    All other components within normal limits  COMPREHENSIVE METABOLIC PANEL - Abnormal; Notable for the following components:   Glucose, Bld 116 (*)    BUN 24 (*)    Creatinine, Ser 1.49 (*)    Calcium 8.7 (*)    ALT 73 (*)    GFR, Estimated 43 (*)    All other components within normal limits  CBC WITH DIFFERENTIAL/PLATELET - Abnormal; Notable for the following components:   RBC 4.04 (*)    Hemoglobin 12.1 (*)    HCT 37.9 (*)    Neutro Abs 8.4 (*)    Lymphs Abs 0.4 (*)    All other components within normal limits  I-STAT CHEM 8, ED - Abnormal; Notable for the following components:   BUN 24 (*)    Creatinine, Ser 1.70 (*)    Glucose, Bld 114 (*)    Hemoglobin 12.6 (*)    HCT 37.0 (*)    All other components within normal limits  CULTURE, BLOOD (ROUTINE X 2)  CULTURE, BLOOD (ROUTINE X 2)  LACTIC ACID, PLASMA  PROTIME-INR  APTT  URINALYSIS, W/ REFLEX TO CULTURE (INFECTION SUSPECTED)    EKG EKG Interpretation  Date/Time:  Wednesday January 02 2023 02:45:19 EDT Ventricular Rate:  85 PR Interval:  211 QRS Duration: 100 QT Interval:  354 QTC Calculation: 421 R Axis:   32 Text Interpretation: Sinus rhythm Inferior infarct, old Confirmed by Quintella Reichert [phone]) on 01/02/2023 3:59:11 AM  Radiology CT Renal Stone Study  Result Date: 01/02/2023 CLINICAL DATA:  Abdominal and flank pain, fever, weakness and diarrhea.  EXAM: CT ABDOMEN AND PELVIS WITHOUT CONTRAST TECHNIQUE: Multidetector CT imaging of the abdomen and pelvis was performed following the standard protocol without IV contrast. RADIATION DOSE REDUCTION: This exam was performed according to the departmental dose-optimization program which includes automated exposure control, adjustment of the mA and/or kV according to patient size and/or use of iterative  reconstruction technique. COMPARISON:  None Available. FINDINGS: Lower chest: Respiratory motion artifact limits evaluation of the lung bases. There is diffuse bronchial thickening. There is asymmetric opacity in the hypoinflated left lower lobe base which could be atelectasis or pneumonia. No other focal lung base abnormality is seen. Calcifications are scattered along the aortic valve leaflets. There are three-vessel coronary artery calcifications heaviest in the LAD and mild cardiomegaly. No pericardial effusion. Descending aortic atherosclerosis. Hepatobiliary: Respiratory motion also reduces fine detail in the abdomen, but the gallbladder does appear abnormal. It is dilated to 12 cm with multiple stones and there is suspected at least mild wall thickening with pericholecystic edema. The findings may be exaggerated due to breathing motion but ultrasound follow-up is warranted. No abnormality of the unenhanced liver is seen through the breathing motion. Pancreas: Partially atrophic. No other abnormality is seen through the breathing motion. Spleen: No abnormality is seen through the breathing motion. Adrenals/Urinary Tract: There is no adrenal mass. No focal abnormality in the unenhanced renal cortex. No urinary stones are visible through the breathing motion. There is no hydronephrosis or ureteral stone. The bladder thickness is normal. Stomach/Bowel: There is a near circumferential irregular wall prominence at the junction of the body and antrum of the stomach, with air-fluid distention of the more proximal  stomach. This could be due to focal gastritis or infiltrating disease. Upper endoscopy is recommended. The unopacified small bowel and appendix are unremarkable. There is moderate fecal stasis ascending and proximal transverse colon. Scattered colonic diverticulosis greatest in the sigmoid. There are faint stranding changes alongside the mid third of the descending colon, which could be due to colitis/diverticulitis or fat scarring from old diverticular disease. No other focal pericolic process is seen. Vascular/Lymphatic: Moderate to heavy aortoiliac calcific plaque. Fusiform infrarenal AAA measures 3.0 x 2.7 cm AP and transverse on 3:50, and extends for a length of 4 cm. No other vascular aneurysm is seen. Reproductive: Prior radical prostatectomy. Numerous surgical clips line the pelvic sidewalls and floor. No mass is seen in the prostate bed. Other: No abdominal wall hernia or abnormality. No abdominopelvic ascites. There is no free air or abscess. Musculoskeletal: Osteopenia with degenerative change thoracic and lumbar spine. Unilateral ankylosis right SI joint. No focal pathologic bone lesion. IMPRESSION: 1. Motion limited study. 2. Gallbladder dilatation with stones, suspected wall thickening and pericholecystic edema. Ultrasound follow-up is warranted. 3. Near circumferential irregular wall prominence at the junction of the body and antrum of the stomach with air-fluid distention of the more proximal stomach. This could be due to focal gastritis or infiltrating disease. Upper endoscopy is recommended. 4. Faint stranding changes alongside the mid third of the descending colon which could be due to a mild colitis/diverticulitis or fat scarring from old diverticular disease. 5. Constipation and diverticulosis. 6. Aortic and coronary artery atherosclerosis. 7. 3.0 x 2.7 cm fusiform infrarenal AAA. Recommend follow-up every 3 years. 8. Bronchitis with asymmetric opacity in the hypoinflated left lower lobe base  which could be atelectasis or pneumonia. 9. Prior radical prostatectomy. No mass is seen in the prostate bed. 10. Osteopenia and degenerative change. Aortic Atherosclerosis (ICD10-I70.0). Electronically Signed   By: Telford Nab M.D.   On: 01/02/2023 04:42   CT Head Wo Contrast  Result Date: 01/02/2023 CLINICAL DATA:  89 year old male with fever, weakness, diarrhea, altered mental status. EXAM: CT HEAD WITHOUT CONTRAST TECHNIQUE: Contiguous axial images were obtained from the base of the skull through the vertex without intravenous contrast. RADIATION DOSE REDUCTION: This exam was  performed according to the departmental dose-optimization program which includes automated exposure control, adjustment of the mA and/or kV according to patient size and/or use of iterative reconstruction technique. COMPARISON:  Brain MRI 09/07/2017.  Head CT 02/16/2021. FINDINGS: Study is mildly degraded by motion artifact despite repeated imaging attempts. Brain: Stable cerebral volume since 2022. No midline shift, ventriculomegaly, mass effect, evidence of mass lesion, intracranial hemorrhage or evidence of cortically based acute infarction. Stable and normal for age gray-white matter differentiation throughout the brain. No cortical encephalomalacia identified. Vascular: Calcified atherosclerosis at the skull base. Skull: No acute osseous abnormality identified. Sinuses/Orbits: Mild chronic left mastoid effusion is stable. Other visualized paranasal sinuses and mastoids are stable and well aerated. Tympanic cavities are clear. Other: No acute orbit or scalp soft tissue finding. IMPRESSION: 1. Mildly motion degraded, but negative for age non contrast CT appearance of the brain. 2. Mild chronic left mastoid effusion. Electronically Signed   By: Genevie Ann M.D.   On: 01/02/2023 04:14   DG Chest Port 1 View  Result Date: 01/02/2023 CLINICAL DATA:  QUESTIONABLE SEPSIS WITH COUGH, FEVER, NAUSEA AND DIARRHEA. EXAM: PORTABLE CHEST 1  VIEW COMPARISON:  PORTABLE CHEST 05/08/2022 FINDINGS: Due to positioning the patient's chin and neck obscure the lung apices. The lungs are expiratory. The stomach is gas distended as before. Peribronchial cuffing continues to be noted with perihilar linear atelectasis. The visualized lungs are clear. The lower lung zones are obscured due to expiration. The sulci are sharp. The mediastinal configuration is stable. There is aortic atherosclerosis. Normal cardiac size and central vessels. There is thoracic spondylosis and osteopenia. No acute osseous findings. Overlying monitor wires. IMPRESSION: 1. Limited study due to positioning and expiration. 2. Peribronchial cuffing with perihilar linear atelectasis. 3. No convincing new lung opacities. 4. The stomach gas distended as before. Electronically Signed   By: Telford Nab M.D.   On: 01/02/2023 03:23    Procedures Procedures    Medications Ordered in ED Medications - No data to display  ED Course/ Medical Decision Making/ A&P                             Medical Decision Making Amount and/or Complexity of Data Reviewed Labs: ordered. Radiology: ordered. ECG/medicine tests: ordered.  Risk Decision regarding hospitalization.   Patient with history of dementia, hypertension, CKD here for evaluation of altered mental status, weakness, cough.  He is globally weak on examination and does appear confused.  He is nontoxic-appearing.  Chest x-ray without overt pneumonia-images personally reviewed and interpreted, agree with radiologist interpretation.  Labs with stable renal function when compared to priors.  CT head was obtained due to altered mental status-no acute abnormality.  CT stone study was obtained given concern for possible infection and patient's reported back pain.  CT scan does demonstrate multiple incidental findings including changes to the gallbladder.  Patient does not have any direct tenderness on palpation of the abdomen.  He has  minimal elevation in his ALT.  Doubt acute cholecystitis.  He is positive for COVID-19 in the emergency department.  Suspect COVID-19 infection is contributing to the bulk of his symptoms.  Patient's wife declines In-N-Out cath in the emergency department to obtain urinalysis.  Given patient's change in mental status, inability to safely ambulate in his current living situation medicine consulted for admission for ongoing care.  Patient and wife updated of findings of studies and recommendation for admission and they are in agreement  with treatment.        Final Clinical Impression(s) / ED Diagnoses Final diagnoses:  COVID-19 virus infection  Generalized weakness  Altered mental status, unspecified altered mental status type    Rx / DC Orders ED Discharge Orders     None         Quintella Reichert, MD 01/02/23 [id number]    Quintella Reichert, MD 01/02/23 [phone]

## 2023-01-02 NOTE — ED Triage Notes (Signed)
Patient BIB EMS from [facility].  Per staff patient has had a cough x 2 days, diarrhea, fever and weakness starting today. Patient has hx of dementia, mentation is at baseline. Per staff they have [place] going around right now.

## 2023-01-02 NOTE — TOC Initial Note (Signed)
Transition of Care [facility]) - Initial/Assessment Note    Patient Details  Name: Devin Vance MRN: [id number] Date of Birth: 1928-01-09  [facility]) CM/SW Contact:    Devin Regulus, LCSW Phone Number: 01/02/2023, 3:37 PM  Clinical Narrative:                 CSW briefly spoke with pt's wife Devin Vance , she reported pt is from [facility] independent living. TOC to follow for d/c needs.   Expected Discharge Plan:  (TBD) Barriers to Discharge: Continued Medical Work up   Patient Goals and CMS Choice Patient states their goals for this hospitalization and ongoing recovery are:: return          Expected Discharge Plan and Services       Living arrangements for the past 2 months: [place]                                      Prior Living Arrangements/Services Living arrangements for the past 2 months: [place] Lives with:: Self, Spouse Patient language and need for interpreter reviewed:: Yes Do you feel safe going back to the place where you live?: Yes      Need for Family Participation in Patient Care: No (Comment) Care giver support system in place?: Yes (comment)   Criminal Activity/Legal Involvement Pertinent to Current Situation/Hospitalization: No - Comment as needed  Activities of Daily Living Home Assistive Devices/Equipment: Walker (specify type) ADL Screening (condition at time of admission) Patient's cognitive ability adequate to safely complete daily activities?: No Is the patient deaf or have difficulty hearing?: No Does the patient have difficulty seeing, even when wearing glasses/contacts?: No Does the patient have difficulty concentrating, remembering, or making decisions?: Yes Patient able to express need for assistance with ADLs?: Yes Does the patient have difficulty dressing or bathing?: Yes Independently performs ADLs?: No Communication: Needs assistance Is this a change from baseline?:  Pre-admission baseline Dressing (OT): Needs assistance Is this a change from baseline?: Pre-admission baseline Grooming: Needs assistance Is this a change from baseline?: Pre-admission baseline Feeding: Needs assistance Is this a change from baseline?: Pre-admission baseline Bathing: Needs assistance Is this a change from baseline?: Pre-admission baseline Toileting: Needs assistance Is this a change from baseline?: Pre-admission baseline In/Out Bed: Needs assistance Walks in Home: Needs assistance Is this a change from baseline?: Pre-admission baseline Does the patient have difficulty walking or climbing stairs?: Yes Weakness of Legs: Both Weakness of Arms/Hands: Both  Permission Sought/Granted                  Emotional Assessment              Admission diagnosis:  Generalized weakness [R53.1] Altered mental status, unspecified altered mental status type [R41.82] Acute hypoxemic respiratory failure due to COVID-19 ([place]) [U07.1, J96.01] COVID-19 virus infection [U07.1] Patient Active Problem List   Diagnosis Date Noted   COVID-19 virus infection 01/02/2023   AAA (abdominal aortic aneurysm) ([place]) 01/02/2023   Dilation of stomach 01/02/2023   Chronic diarrhea 01/02/2023   DAT (dementia of Alzheimer type) ([place]) 06/02/2022   AMS (altered mental status) 02/16/2021   Gait abnormality 07/15/2019   Cardiomyopathy ([place]) 03/24/2019   COPD (chronic obstructive pulmonary disease) ([place]) 03/24/2019   HLD (hyperlipidemia) 03/24/2019   Acute renal failure superimposed on stage 3 chronic kidney disease ([place]) 10/28/2018   CKD (chronic kidney  disease) stage 3, GFR 30-59 ml/min (HCC) 10/28/2018   Rheumatoid arthritis involving multiple sites ([place]) 10/28/2018   Dementia with behavioral disturbance ([place]) 10/27/2018   Alzheimer's dementia with behavioral disturbance ([place]) 07/31/2018   HTN (hypertension) 08/15/2017   Anemia, mild 02/12/2017   Olecranon bursitis of right elbow  02/12/2017   Kyphosis of cervical region 10/26/2015   Intermittent alternating esotropia 05/18/2015   GERD (gastroesophageal reflux disease) 01/22/2014   Angina pectoris ([place]) 01/22/2014   PCP:  Wenda Low, MD Pharmacy:   [facility] [place], [place] - [address] AT [facility] [address] [place] [address] Phone: [phone] Fax: [phone]     Social Determinants of Health (SDOH) Social History: SDOH Screenings   Food Insecurity: No Food Insecurity (01/02/2023)  Housing: Low Risk  (01/02/2023)  Transportation Needs: No Transportation Needs (01/02/2023)  Utilities: Not At Risk (01/02/2023)  Tobacco Use: Low Risk  (01/02/2023)   SDOH Interventions:     Readmission Risk Interventions     No data to display

## 2023-01-02 NOTE — H&P (Signed)
History and Physical    Patient: Devin Vance [id number] DOB: 1928-09-21 DOA: 01/02/2023 DOS: the patient was seen and examined on 01/02/2023 PCP: Wenda Low, MD  Patient coming from: Home  Chief Complaint:  Chief Complaint  Patient presents with   Cough   Diarrhea        Weakness   HPI: ZYDARIUS LINKENHOKER is a 89 y.o. male with medical history significant of Alzheimer's disease, CAD, history of angina pectoris, unspecified cardiomyopathy, rheumatoid arthritis, polymyalgia rheumatica, COPD, GERD, colon polyps, hyperlipidemia, hypertension, overweight, osteoporosis, prostate cancer, secondary Parkinson's disease, sleep apnea who is coming to the emergency department from his assisted living facility due to fever, cough, chronic diarrhea and generalized weakness since earlier today.  The facility staff stated that they have been having several episodes of COVID.  His appetite also has been decreased for the past day.the patient's wife gave him 2 tablets of loperamide, which she normally uses for diarrhea.  The patient's daughter stated that she tested him with a home test and got a weak positive test, which she discussed with the patient's PCP.  He is unable to provide further information given his history of dementia.  Lab work: Urinalysis was unremarkable aside for mild proteinuria 30 mg/dL.  Coronavirus PCR was positive.  CBC showed white count of 9.6, hemoglobin 12.1 g/dL platelets 214.  Normal PT, INR and PTT.  Normal lactic acid.  CMP showed glucose 116, BUN 24, creatinine 1.49 mg/dL and ALT was 73 units/L.  The rest of the CMP measurements were normal after calcium correction.  Imaging: Portable 1 view chest radiograph was limited due to positioning and inspiration.  There was peribronchial cuffing with perihilar linear atelectasis.  No convincing new lung opacities.  There is gaseous distention of the stomach as before.  CT has mildly motion degraded, but negative for age.  Mild chronic  left mastoid effusion.  CT renal study was motion degraded.  There was gallbladder dilatation with stone suspected wall thickening and pericholecystic edema near circumferential irregular wall prominence at the junction of the body and antrum of the stomach with air-fluid distention of the more proximal stomach.  This could be due to focal gastritis or infiltrating disease.  Upper endoscopy recommended. Stranding changes alongside the mid third of the descending colon which could be due to a mild colitis/diverticulitis or fat scaring from mild diverticular disease.  There was constipation and diverticulosis.  Aortic and coronary artery atherosclerosis.  3.0 x 2.7 cm fusiform infrarenal AAA.  Bronchitis with asymmetric opacity in the hypoinflated and left lower lobe base which could be atelectasis or pneumonia.  Prior radical prostatectomy.  Osteopenia.  ED course: Initial vital signs were temperature 99.7 F, pulse 86, respirations 17, BP 134/69 mmHg and O2 sat 98% on room air.   Review of Systems: As mentioned in the history of present illness. All other systems reviewed and are negative.  Past Medical History:  Diagnosis Date   Alzheimer disease ([place]) 07/31/2018   Angina pectoris (HCC)    Arthritis    Cardiomyopathy ([place])    COPD (chronic obstructive pulmonary disease) (HCC)    Gastroesophageal reflux disease    History of colon polyps    Hyperlipidemia    Hypertension    Obesity    Osteoporosis    Polymyalgia rheumatica (HCC)    Prostate cancer ([place])    Rheumatoid arthritis ([place])    Secondary Parkinson disease ([place]) 08/13/2019   Sleep apnea    Past Surgical History:  Procedure Laterality Date   CATARACT EXTRACTION     CORONARY ANGIOPLASTY     HEMORRHOIDECTOMY WITH HEMORRHOID BANDING     PROSTATECTOMY     STRABISMUS SURGERY     TONSILLECTOMY     Social History:  reports that he has never smoked. He has never used smokeless tobacco. He reports that he does not currently use  alcohol. He reports that he does not currently use drugs.  Allergies  Allergen Reactions   Lorazepam Other (See Comments)    Generalized weakness    Family History  Problem Relation Age of Onset   Stroke Mother    Dementia Mother    Cancer Father    Dementia Sister    Dementia Maternal Aunt     Prior to Admission medications   Medication Sig Start Date End Date Taking? Authorizing Provider  aspirin EC 81 MG tablet Take by mouth.    [provider]  Cholecalciferol (VITAMIN D-3 PO) Take 1,000 units of lipase by mouth.    [provider]  Cyanocobalamin (VITAMIN B12) 1000 MCG TBCR 2,500 mcg daily.    [provider]  Fluticasone Propionate (FLONASE ALLERGY RELIEF NA) Place into the nose.    [provider]  hydroxychloroquine (PLAQUENIL) 200 MG tablet Take 200 mg by mouth daily. One pill 200mg  every other day , other days two pills 06/11/19   [provider]  ketoconazole (NIZORAL) 2 % shampoo  02/13/19   [provider]  Lactobacillus (PROBIOTIC ACIDOPHILUS PO) Take 1 tablet by mouth daily.    [provider]  levothyroxine (SYNTHROID) 25 MCG tablet 1 tablet in the morning on an empty stomach 04/29/20   [provider]  memantine (NAMENDA) 10 MG tablet Take 1 tablet (10 mg total) by mouth 2 (two) times daily. 05/31/22   [name], MD  metoprolol succinate (TOPROL-XL) 25 MG 24 hr tablet Take 12.5 mg by mouth.  08/20/16 05/31/22  [provider]  modafinil (PROVIGIL) 100 MG tablet Take 1 tablet (100 mg total) by mouth daily. 12/05/22   [name], MD  Multiple Vitamin (MULTIVITAMIN) capsule Take by mouth.    [provider]  Omega-3 Fatty Acids (FISH OIL) 1200 MG CAPS Take by mouth.    [provider]  QUEtiapine (SEROQUEL) 25 MG tablet Take 1 tablet (25 mg total) by mouth at bedtime. 05/31/22   [name], MD    Physical Exam: Vitals:   01/02/23 0126 01/02/23 0138 01/02/23  0200 01/02/23 0400  BP: 134/69  119/84 (!) 137/97  Pulse: 86  87 70  Resp: 17  16 18   Temp: 99.7 F (37.6 C)     SpO2: 98%  94% 94%  Weight:  91.3 kg    Height:  5\' 10"  (1.778 m)     Physical Exam Vitals and nursing note reviewed.  Constitutional:      Appearance: Normal appearance.  HENT:     Head: Normocephalic.     Mouth/Throat:     Mouth: Mucous membranes are moist.  Eyes:     General: No scleral icterus.    Pupils: Pupils are equal, round, and reactive to light.  Cardiovascular:     Rate and Rhythm: Normal rate and regular rhythm.  Pulmonary:     Effort: Pulmonary effort is normal.     Breath sounds: Normal breath sounds.  Abdominal:     General: Bowel sounds are normal. There is no distension.     Palpations: Abdomen  is soft.     Tenderness: There is no abdominal tenderness. There is no guarding.  Musculoskeletal:     Cervical back: Neck supple.     Right lower leg: No edema.     Left lower leg: No edema.  Skin:    General: Skin is warm and dry.  Neurological:     General: No focal deficit present.     Mental Status: He is alert. Mental status is at baseline.  Psychiatric:        Mood and Affect: Mood normal.   Data Reviewed:  Results are pending, will review when available. EKG: Vent. rate 85 BPM PR interval 211 ms QRS duration 100 ms QT/QTcB 354/421 ms P-R-T axes 65 32 29 Sinus rhythm Inferior infarct, old  Assessment and Plan: Principal Problem:   COVID-19 virus infection Superimposed on:   COPD (chronic obstructive pulmonary disease) (HCC) No significant respiratory symptoms. Admit to telemetry/inpatient. Supplemental oxygen as needed. Bronchodilators as needed. Incentive spirometry while awake. Flutter valve exercises. Antitussives as needed. Vitamin C/zinc supplementation Paxlovid per pharmacy. Follow-up CBC, CMP.  Active Problems:   AAA (abdominal aortic aneurysm) ([place]) Briefly discussed with family members. Follow-up with imaging  in 3 years. Currently not on treatment for hyperlipidemia.    HLD (hyperlipidemia) Will defer to primary.    Dilation of stomach Superimposed on: GERD (gastroesophageal reflux disease) Will begin PPI. Outpatient evaluation with gastroenterology    Chronic diarrhea No current clinical evidence of diverticulitis. GI evaluation is benign. Consider GI evaluation as an outpatient.    HTN (hypertension) Continue metoprolol 12.5 mg p.o. at bedtime. Monitor blood pressure and heart rate.    CKD (chronic kidney disease) stage 3b, GFR 30-59 ml/min (HCC) Monitor renal function and electrolytes. Avoid hypotension and nephrotoxins.    Dementia with behavioral disturbance (HCC) Continue Seroquel 25 mg p.o. at bedtime. Continue Namenda 10 mg p.o. twice daily. Would consider risperidone or haloperidol as needed if sundowning.    Rheumatoid arthritis involving multiple sites (HCC) Continue Plaquenil 200 mg p.o. twice daily. Follow-up with rheumatology as an outpatient.    Advance Care Planning:   Code Status: Full Code   Consults:   Family Communication: His wife and daughter were at bedside.  Severity of Illness: The appropriate patient status for this patient is INPATIENT. Inpatient status is judged to be reasonable and necessary in order to provide the required intensity of service to ensure the patient's safety. The patient's presenting symptoms, physical exam findings, and initial radiographic and laboratory data in the context of their chronic comorbidities is felt to place them at high risk for further clinical deterioration. Furthermore, it is not anticipated that the patient will be medically stable for discharge from the hospital within 2 midnights of admission.   * I certify that at the point of admission it is my clinical judgment that the patient will require inpatient hospital care spanning beyond 2 midnights from the point of admission due to high intensity of service, high  risk for further deterioration and high frequency of surveillance required.*  Author: Reubin Milan, MD 01/02/2023 7:37 AM  For on call review [email or web address].   This document was prepared using Dragon voice recognition software and may contain some unintended transcription errors.

## 2023-01-03 DIAGNOSIS — U071 COVID-19: Secondary | ICD-10-CM | POA: Diagnosis not present

## 2023-01-03 LAB — COMPREHENSIVE METABOLIC PANEL
ALT: 55 U/L — ABNORMAL HIGH (ref 0–44)
AST: 49 U/L — ABNORMAL HIGH (ref 15–41)
Albumin: 3.2 g/dL — ABNORMAL LOW (ref 3.5–5.0)
Alkaline Phosphatase: 66 U/L (ref 38–126)
Anion gap: 7 (ref 5–15)
BUN: 25 mg/dL — ABNORMAL HIGH (ref 8–23)
CO2: 24 mmol/L (ref 22–32)
Calcium: 8.5 mg/dL — ABNORMAL LOW (ref 8.9–10.3)
Chloride: 106 mmol/L (ref 98–111)
Creatinine, Ser: 1.32 mg/dL — ABNORMAL HIGH (ref 0.61–1.24)
GFR, Estimated: 50 mL/min — ABNORMAL LOW (ref >60)
Glucose, Bld: 137 mg/dL — ABNORMAL HIGH (ref 70–99)
Potassium: 4.2 mmol/L (ref 3.5–5.1)
Sodium: 137 mmol/L (ref 135–145)
Total Bilirubin: 0.5 mg/dL (ref 0.3–1.2)
Total Protein: 5.9 g/dL — ABNORMAL LOW (ref 6.5–8.1)

## 2023-01-03 LAB — CBC
HCT: 35.6 % — ABNORMAL LOW (ref 39.0–52.0)
Hemoglobin: 11.6 g/dL — ABNORMAL LOW (ref 13.0–17.0)
MCH: 30.1 pg (ref 26.0–34.0)
MCHC: 32.6 g/dL (ref 30.0–36.0)
MCV: 92.2 fL (ref 80.0–100.0)
Platelets: 195 10*3/uL (ref 150–400)
RBC: 3.86 MIL/uL — ABNORMAL LOW (ref 4.22–5.81)
RDW: 13.1 % (ref 11.5–15.5)
WBC: 8.1 10*3/uL (ref 4.0–10.5)
nRBC: 0 % (ref 0.0–0.2)

## 2023-01-03 LAB — CREATININE, SERUM
Creatinine, Ser: 1.43 mg/dL — ABNORMAL HIGH (ref 0.61–1.24)
GFR, Estimated: 46 mL/min — ABNORMAL LOW (ref >60)

## 2023-01-03 MED ORDER — IPRATROPIUM-ALBUTEROL 0.5-2.5 (3) MG/3ML IN SOLN: 3.0000 mL | Freq: Two times a day (BID) | RESPIRATORY_TRACT | Status: DC

## 2023-01-03 MED ORDER — DEXAMETHASONE 4 MG PO TABS
6.0000 mg | ORAL_TABLET | Freq: Every day | ORAL | Status: DC
  Administered 2023-01-03 – 2023-01-05 (×3): 6 mg via ORAL
  Filled 2023-01-03 (×3): qty 1

## 2023-01-03 MED ORDER — LOPERAMIDE HCL 2 MG PO CAPS: 2.0000 mg | ORAL_CAPSULE | ORAL | Status: DC | PRN

## 2023-01-03 MED ORDER — GUAIFENESIN ER 600 MG PO TB12
600.0000 mg | ORAL_TABLET | Freq: Two times a day (BID) | ORAL | Status: DC
  Administered 2023-01-03 – 2023-01-05 (×5): 600 mg via ORAL
  Filled 2023-01-03 (×5): qty 1

## 2023-01-03 MED ORDER — IPRATROPIUM-ALBUTEROL 0.5-2.5 (3) MG/3ML IN SOLN
3.0000 mL | Freq: Two times a day (BID) | RESPIRATORY_TRACT | Status: DC
  Administered 2023-01-03 – 2023-01-05 (×4): 3 mL via RESPIRATORY_TRACT
  Filled 2023-01-03 (×5): qty 3

## 2023-01-03 NOTE — Progress Notes (Signed)
Patient given flutter. Patient caregiver at bedside for education. Patient with a history of dementia; caregiver will assist w/ flutter as needed. Patient BBS clear/diminished and equal; no adventitious noted. RR even and no increased work of breathing noted during assessment. Patient has a dry and none productive cough. Patient demonstrated good effort.

## 2023-01-03 NOTE — Progress Notes (Addendum)
PROGRESS NOTE    Devin Vance  [id number] DOB: 1928-02-10 DOA: 01/02/2023 PCP: Wenda Low, MD   Brief Narrative: 89 year old with past medical history significant for Alzheimer's disease, CAD, history of angina pectoris, cardiomyopathy unspecified, rheumatoid arthritis, polymyalgia rheumatica, COPD, GERD, hyperlipidemia, hypertension, osteoporosis, prostate cancer, secondary parkinsonism disease, sleep apnea who presents complaining of fever, cough, chronic diarrhea, generalized weakness since the day of admission.   Evaluation in the ED:  he was found to be positive for COVID, CT renal study showed gallbladder dilation with a stone suspected, wall thickening and pericholecystic edema near circumferential irregular wall prominence at the junction of the body and antrum of the stomach with air-fluid distention of the more proximal stomach.  This could be focal gastritis or infiltrative disease.  Changes alongside the mid third of the descending colon which could be due to mild colitis/diverticulitis or fat scaring from diverticular diseases.      Assessment & Plan:   Principal Problem:   COVID-19 virus infection Active Problems:   HTN (hypertension)   GERD (gastroesophageal reflux disease)   CKD (chronic kidney disease) stage 3, GFR 30-59 ml/min (HCC)   COPD (chronic obstructive pulmonary disease) (HCC)   Dementia with behavioral disturbance (HCC)   HLD (hyperlipidemia)   Rheumatoid arthritis involving multiple sites ([place])   AAA (abdominal aortic aneurysm) (HCC)   Dilation of stomach   Chronic diarrhea   1-Covid 19 Viral Infection:  COPD: Hypoxic overnight. Placed on 2 L oxygen.  Continue with Paxlovid  Start Oral dexamethasone.  Start Guaifenesin and duoneb.    Gallbladder dilation with a stone pericholecystic fluid on CT scan:  [place] : poor Quality.  Patient started to eat. No nausea, vomiting or abdominal pain.  Plan to monitor for now.   Abnormal stomach finding  on CT scan: Could be focal gastritis or infiltrating disease Started on PPI.  Out patient follow up if family wishes to purse further work up./   HLD; Resume fish oil at discharge   Transaminases.  Suspect related to covid.  Monitor.   AAA; FU out patient.   Chronic diarrhea: PRN Imodium/   Hypertension: Continue with metoprolol./   CKD stage IIIb: Cr range 1.6 Monitor renal function.   Dementia  with behavioral disturbance: Continue with Nemanda and Seroquel.   Moderate arthritis On Plaquenil/       Estimated body mass index is 28.88 kg/m as calculated from the following:   Height as of this encounter: 5\' 10"  (1.778 m).   Weight as of this encounter: 91.3 kg.   DVT prophylaxis: Lovnox Code Status: Full code Family Communication: Daughter at bedside.  Disposition Plan:  Status is: Inpatient Remains inpatient appropriate because: management of Covid viral illness    Consultants:  None  Procedures:  None  Antimicrobials:    Subjective: He is alert, breathing ok, has dry cough.  He denies abdominal pain. He started to eat today    Objective: Vitals:   01/02/23 1850 01/02/23 2246 01/03/23 0153 01/03/23 0634  BP: (!) 109/59 (!) 125/59 (!) 124/54 113/61  Pulse: 66 65 65 60  Resp: 18  20 20   Temp: 98.9 F (37.2 C) 98.4 F (36.9 C) 97.8 F (36.6 C)   TempSrc: Oral Oral Oral   SpO2: 91% 93% 90% 97%  Weight:      Height:        Intake/Output Summary (Last 24 hours) at 01/03/2023 0817 Last data filed at 01/03/2023 0644 Gross per 24 hour  Intake 600  ml  Output 950 ml  Net -350 ml   Filed Weights   01/02/23 0138  Weight: 91.3 kg    Examination:  General exam: Appears calm and comfortable  Respiratory system: BL ronchus Respiratory effort normal. Cardiovascular system: S1 & S2 heard, RRR. N Gastrointestinal system: Abdomen is nondistended, soft and nontender.  Central nervous system: Alert and oriented. No focal neurological  deficits. Extremities: Symmetric 5 x 5 power.    Data Reviewed: I have personally reviewed following labs and imaging studies  CBC: Recent Labs  Lab 01/02/23 0256 01/02/23 0312  WBC 9.6  --   NEUTROABS 8.4*  --   HGB 12.1* 12.6*  HCT 37.9* 37.0*  MCV 93.8  --   PLT 214  --    Basic Metabolic Panel: Recent Labs  Lab 01/02/23 0256 01/02/23 0312 01/03/23 0441  NA 136 139  --   K 4.2 4.3  --   CL 105 103  --   CO2 26  --   --   GLUCOSE 116* 114*  --   BUN 24* 24*  --   CREATININE 1.49* 1.70* 1.43*  CALCIUM 8.7*  --   --    GFR: Estimated Creatinine Clearance: 36.7 mL/min (A) (by C-G formula based on SCr of 1.43 mg/dL (H)). Liver Function Tests: Recent Labs  Lab 01/02/23 0256  AST 24  ALT 73*  ALKPHOS 76  BILITOT 0.4  PROT 6.6  ALBUMIN 3.7   No results for input(s): "LIPASE", "AMYLASE" in the last 168 hours. No results for input(s): "AMMONIA" in the last 168 hours. Coagulation Profile: Recent Labs  Lab 01/02/23 0256  INR 1.1   Cardiac Enzymes: No results for input(s): "CKTOTAL", "CKMB", "CKMBINDEX", "TROPONINI" in the last 168 hours. BNP (last 3 results) No results for input(s): "PROBNP" in the last 8760 hours. HbA1C: No results for input(s): "HGBA1C" in the last 72 hours. CBG: No results for input(s): "GLUCAP" in the last 168 hours. Lipid Profile: No results for input(s): "CHOL", "HDL", "LDLCALC", "TRIG", "CHOLHDL", "LDLDIRECT" in the last 72 hours. Thyroid Function Tests: No results for input(s): "TSH", "T4TOTAL", "FREET4", "T3FREE", "THYROIDAB" in the last 72 hours. Anemia Panel: No results for input(s): "VITAMINB12", "FOLATE", "FERRITIN", "TIBC", "IRON", "RETICCTPCT" in the last 72 hours. Sepsis Labs: Recent Labs  Lab 01/02/23 0256  LATICACIDVEN 1.1    Recent Results (from the past 240 hour(s))  Resp panel by RT-PCR (RSV, Flu A&B, Covid) Anterior Nasal Swab     Status: Abnormal   Collection Time: 01/02/23  2:50 AM   Specimen: Anterior  Nasal Swab  Result Value Ref Range Status   SARS Coronavirus 2 by RT PCR POSITIVE (A) NEGATIVE Final    Comment: (NOTE) SARS-CoV-2 target nucleic acids are DETECTED.  The SARS-CoV-2 RNA is generally detectable in upper respiratory specimens during the acute phase of infection. Positive results are indicative of the presence of the identified virus, but do not rule out bacterial infection or co-infection with other pathogens not detected by the test. Clinical correlation with patient history and other diagnostic information is necessary to determine patient infection status. The expected result is Negative.  Fact Sheet for Patients: EntrepreneurPulse.com.au  Fact Sheet for Healthcare Providers: IncredibleEmployment.be  This test is not yet approved or cleared by the [place] FDA and  has been authorized for detection and/or diagnosis of SARS-CoV-2 by FDA under an Emergency Use Authorization (EUA).  This EUA will remain in effect (meaning this test can be used) for the duration of  the COVID-19 declaration under Section 564(b)(1) of the A ct, 21 U.S.C. section 360bbb-3(b)(1), unless the authorization is terminated or revoked sooner.     Influenza A by PCR NEGATIVE NEGATIVE Final   Influenza B by PCR NEGATIVE NEGATIVE Final    Comment: (NOTE) The Xpert Xpress SARS-CoV-2/FLU/RSV plus assay is intended as an aid in the diagnosis of influenza from Nasopharyngeal swab specimens and should not be used as a sole basis for treatment. Nasal washings and aspirates are unacceptable for Xpert Xpress SARS-CoV-2/FLU/RSV testing.  Fact Sheet for Patients: EntrepreneurPulse.com.au  Fact Sheet for Healthcare Providers: IncredibleEmployment.be  This test is not yet approved or cleared by the [place] FDA and has been authorized for detection and/or diagnosis of SARS-CoV-2 by FDA under an Emergency Use  Authorization (EUA). This EUA will remain in effect (meaning this test can be used) for the duration of the COVID-19 declaration under Section 564(b)(1) of the Act, 21 U.S.C. section 360bbb-3(b)(1), unless the authorization is terminated or revoked.     Resp Syncytial Virus by PCR NEGATIVE NEGATIVE Final    Comment: (NOTE) Fact Sheet for Patients: EntrepreneurPulse.com.au  Fact Sheet for Healthcare Providers: IncredibleEmployment.be  This test is not yet approved or cleared by the [place] FDA and has been authorized for detection and/or diagnosis of SARS-CoV-2 by FDA under an Emergency Use Authorization (EUA). This EUA will remain in effect (meaning this test can be used) for the duration of the COVID-19 declaration under Section 564(b)(1) of the Act, 21 U.S.C. section 360bbb-3(b)(1), unless the authorization is terminated or revoked.  Performed at [facility], [place] [address], [place], [place] [address]          Radiology Studies: US Abdomen Limited RUQ (LIVER/GB)  Result Date: 01/02/2023 CLINICAL DATA:  89 year old male with history of abdominal pain. EXAM: ULTRASOUND ABDOMEN LIMITED RIGHT UPPER QUADRANT COMPARISON:  CT of the abdomen and pelvis 01/02/2023. FINDINGS: Comment: Today's study is exceedingly limited secondary to poor sonographic windows (study is very limited by the presence of extensive bowel gas, presumably related to colonic interposition as demonstrated on prior CT examination). Gallbladder: Poorly visualized. Common bile duct: Could not be definitively visualized. Liver: Poorly visualized. Other: None. IMPRESSION: 1. Poor quality essentially nondiagnostic study secondary to bowel gas interposed between the transducer in the liver/gallbladder. If there is clinical concern for acute cholecystitis, further evaluation with nuclear medicine hepatobiliary scan should be considered. Electronically Signed   By:  Vinnie Langton M.D.   On: 01/02/2023 06:58   CT Renal Stone Study  Result Date: 01/02/2023 CLINICAL DATA:  Abdominal and flank pain, fever, weakness and diarrhea. EXAM: CT ABDOMEN AND PELVIS WITHOUT CONTRAST TECHNIQUE: Multidetector CT imaging of the abdomen and pelvis was performed following the standard protocol without IV contrast. RADIATION DOSE REDUCTION: This exam was performed according to the departmental dose-optimization program which includes automated exposure control, adjustment of the mA and/or kV according to patient size and/or use of iterative reconstruction technique. COMPARISON:  None Available. FINDINGS: Lower chest: Respiratory motion artifact limits evaluation of the lung bases. There is diffuse bronchial thickening. There is asymmetric opacity in the hypoinflated left lower lobe base which could be atelectasis or pneumonia. No other focal lung base abnormality is seen. Calcifications are scattered along the aortic valve leaflets. There are three-vessel coronary artery calcifications heaviest in the LAD and mild cardiomegaly. No pericardial effusion. Descending aortic atherosclerosis. Hepatobiliary: Respiratory motion also reduces fine detail in the abdomen, but the gallbladder does appear abnormal. It is  dilated to 12 cm with multiple stones and there is suspected at least mild wall thickening with pericholecystic edema. The findings may be exaggerated due to breathing motion but ultrasound follow-up is warranted. No abnormality of the unenhanced liver is seen through the breathing motion. Pancreas: Partially atrophic. No other abnormality is seen through the breathing motion. Spleen: No abnormality is seen through the breathing motion. Adrenals/Urinary Tract: There is no adrenal mass. No focal abnormality in the unenhanced renal cortex. No urinary stones are visible through the breathing motion. There is no hydronephrosis or ureteral stone. The bladder thickness is normal.  Stomach/Bowel: There is a near circumferential irregular wall prominence at the junction of the body and antrum of the stomach, with air-fluid distention of the more proximal stomach. This could be due to focal gastritis or infiltrating disease. Upper endoscopy is recommended. The unopacified small bowel and appendix are unremarkable. There is moderate fecal stasis ascending and proximal transverse colon. Scattered colonic diverticulosis greatest in the sigmoid. There are faint stranding changes alongside the mid third of the descending colon, which could be due to colitis/diverticulitis or fat scarring from old diverticular disease. No other focal pericolic process is seen. Vascular/Lymphatic: Moderate to heavy aortoiliac calcific plaque. Fusiform infrarenal AAA measures 3.0 x 2.7 cm AP and transverse on 3:50, and extends for a length of 4 cm. No other vascular aneurysm is seen. Reproductive: Prior radical prostatectomy. Numerous surgical clips line the pelvic sidewalls and floor. No mass is seen in the prostate bed. Other: No abdominal wall hernia or abnormality. No abdominopelvic ascites. There is no free air or abscess. Musculoskeletal: Osteopenia with degenerative change thoracic and lumbar spine. Unilateral ankylosis right SI joint. No focal pathologic bone lesion. IMPRESSION: 1. Motion limited study. 2. Gallbladder dilatation with stones, suspected wall thickening and pericholecystic edema. Ultrasound follow-up is warranted. 3. Near circumferential irregular wall prominence at the junction of the body and antrum of the stomach with air-fluid distention of the more proximal stomach. This could be due to focal gastritis or infiltrating disease. Upper endoscopy is recommended. 4. Faint stranding changes alongside the mid third of the descending colon which could be due to a mild colitis/diverticulitis or fat scarring from old diverticular disease. 5. Constipation and diverticulosis. 6. Aortic and coronary artery  atherosclerosis. 7. 3.0 x 2.7 cm fusiform infrarenal AAA. Recommend follow-up every 3 years. 8. Bronchitis with asymmetric opacity in the hypoinflated left lower lobe base which could be atelectasis or pneumonia. 9. Prior radical prostatectomy. No mass is seen in the prostate bed. 10. Osteopenia and degenerative change. Aortic Atherosclerosis (ICD10-I70.0). Electronically Signed   By: Telford Nab M.D.   On: 01/02/2023 04:42   CT Head Wo Contrast  Result Date: 01/02/2023 CLINICAL DATA:  89 year old male with fever, weakness, diarrhea, altered mental status. EXAM: CT HEAD WITHOUT CONTRAST TECHNIQUE: Contiguous axial images were obtained from the base of the skull through the vertex without intravenous contrast. RADIATION DOSE REDUCTION: This exam was performed according to the departmental dose-optimization program which includes automated exposure control, adjustment of the mA and/or kV according to patient size and/or use of iterative reconstruction technique. COMPARISON:  Brain MRI 09/07/2017.  Head CT 02/16/2021. FINDINGS: Study is mildly degraded by motion artifact despite repeated imaging attempts. Brain: Stable cerebral volume since 2022. No midline shift, ventriculomegaly, mass effect, evidence of mass lesion, intracranial hemorrhage or evidence of cortically based acute infarction. Stable and normal for age gray-white matter differentiation throughout the brain. No cortical encephalomalacia identified. Vascular: Calcified atherosclerosis at  the skull base. Skull: No acute osseous abnormality identified. Sinuses/Orbits: Mild chronic left mastoid effusion is stable. Other visualized paranasal sinuses and mastoids are stable and well aerated. Tympanic cavities are clear. Other: No acute orbit or scalp soft tissue finding. IMPRESSION: 1. Mildly motion degraded, but negative for age non contrast CT appearance of the brain. 2. Mild chronic left mastoid effusion. Electronically Signed   By: Genevie Ann M.D.   On:  01/02/2023 04:14   DG Chest Port 1 View  Result Date: 01/02/2023 CLINICAL DATA:  QUESTIONABLE SEPSIS WITH COUGH, FEVER, NAUSEA AND DIARRHEA. EXAM: PORTABLE CHEST 1 VIEW COMPARISON:  PORTABLE CHEST 05/08/2022 FINDINGS: Due to positioning the patient's chin and neck obscure the lung apices. The lungs are expiratory. The stomach is gas distended as before. Peribronchial cuffing continues to be noted with perihilar linear atelectasis. The visualized lungs are clear. The lower lung zones are obscured due to expiration. The sulci are sharp. The mediastinal configuration is stable. There is aortic atherosclerosis. Normal cardiac size and central vessels. There is thoracic spondylosis and osteopenia. No acute osseous findings. Overlying monitor wires. IMPRESSION: 1. Limited study due to positioning and expiration. 2. Peribronchial cuffing with perihilar linear atelectasis. 3. No convincing new lung opacities. 4. The stomach gas distended as before. Electronically Signed   By: Telford Nab M.D.   On: 01/02/2023 03:23        Scheduled Meds:  aspirin EC  81 mg Oral Daily   cyanocobalamin  2,500 mcg Oral Daily   enoxaparin (LOVENOX) injection  40 mg Subcutaneous Q24H   hydroxychloroquine  200 mg Oral QODAY   hydroxychloroquine  400 mg Oral QODAY   levothyroxine  25 mcg Oral Q0600   memantine  10 mg Oral BID   metoprolol succinate  12.5 mg Oral QHS   modafinil  100 mg Oral Daily   multivitamin with minerals  1 tablet Oral Daily   nirmatrelvir/ritonavir (renal dosing)  2 tablet Oral BID   pantoprazole  40 mg Oral Daily   QUEtiapine  25 mg Oral QHS   Continuous Infusions:   LOS: 1 day    Time spent: 35 minutes    Alto Gandolfo A Saree Krogh, MD [facility]   If 7PM-7AM, please contact night-coverage [email or web address]  01/03/2023, 8:17 AM

## 2023-01-03 NOTE — Plan of Care (Signed)

## 2023-01-04 DIAGNOSIS — U071 COVID-19: Secondary | ICD-10-CM | POA: Diagnosis not present

## 2023-01-04 LAB — COMPREHENSIVE METABOLIC PANEL
ALT: 48 U/L — ABNORMAL HIGH (ref 0–44)
AST: 31 U/L (ref 15–41)
Albumin: 3.3 g/dL — ABNORMAL LOW (ref 3.5–5.0)
Alkaline Phosphatase: 70 U/L (ref 38–126)
Anion gap: 11 (ref 5–15)
BUN: 34 mg/dL — ABNORMAL HIGH (ref 8–23)
CO2: 21 mmol/L — ABNORMAL LOW (ref 22–32)
Calcium: 8.5 mg/dL — ABNORMAL LOW (ref 8.9–10.3)
Chloride: 105 mmol/L (ref 98–111)
Creatinine, Ser: 1.38 mg/dL — ABNORMAL HIGH (ref 0.61–1.24)
GFR, Estimated: 48 mL/min — ABNORMAL LOW (ref >60)
Glucose, Bld: 119 mg/dL — ABNORMAL HIGH (ref 70–99)
Potassium: 3.9 mmol/L (ref 3.5–5.1)
Sodium: 137 mmol/L (ref 135–145)
Total Bilirubin: 0.6 mg/dL (ref 0.3–1.2)
Total Protein: 6.2 g/dL — ABNORMAL LOW (ref 6.5–8.1)

## 2023-01-04 NOTE — Plan of Care (Signed)
  Problem: Education: Goal: Knowledge of risk factors and measures for prevention of condition will improve Outcome: Progressing   Problem: Coping: Goal: Psychosocial and spiritual needs will be supported Outcome: Progressing   Problem: Respiratory: Goal: Will maintain a patent airway Outcome: Progressing Goal: Complications related to the disease process, condition or treatment will be avoided or minimized Outcome: Progressing   Problem: Clinical Measurements: Goal: Respiratory complications will improve Outcome: Progressing   Problem: Safety: Goal: Ability to remain free from injury will improve Outcome: Progressing

## 2023-01-04 NOTE — TOC Progression Note (Signed)
Transition of Care [facility]) - Progression Note    Patient Details  Name: Devin Vance MRN: [id number] Date of Birth: Jun 20, 1928  Transition of Care [facility]) CM/SW [place], LCSW Phone Number: 01/04/2023, 4:09 PM  Clinical Narrative:    CSW spoke with pt's daughter about home health recommendations. Pt daughter has agreed to [facility] , she want referral sent ou to [employer]. CSW sent referral to [facility]. TOC to follow.    Expected Discharge Plan:  (TBD) Barriers to Discharge: Continued Medical Work up  Expected Discharge Plan and Services       Living arrangements for the past 2 months: [place]                                       Social Determinants of Health (SDOH) Interventions SDOH Screenings   Food Insecurity: No Food Insecurity (01/02/2023)  Housing: Low Risk  (01/02/2023)  Transportation Needs: No Transportation Needs (01/02/2023)  Utilities: Not At Risk (01/02/2023)  Tobacco Use: Low Risk  (01/02/2023)    Readmission Risk Interventions     No data to display

## 2023-01-04 NOTE — Progress Notes (Signed)
PROGRESS NOTE    Devin Vance  [id number] DOB: 1927-12-17 DOA: 01/02/2023 PCP: Wenda Low, MD   Brief Narrative: 89 year old with past medical history significant for Alzheimer's disease, CAD, history of angina pectoris, cardiomyopathy unspecified, rheumatoid arthritis, polymyalgia rheumatica, COPD, GERD, hyperlipidemia, hypertension, osteoporosis, prostate cancer, secondary parkinsonism disease, sleep apnea who presents complaining of fever, cough, chronic diarrhea, generalized weakness since the day of admission.   Evaluation in the ED:  he was found to be positive for COVID, CT renal study showed gallbladder dilation with a stone suspected, wall thickening and pericholecystic edema near circumferential irregular wall prominence at the junction of the body and antrum of the stomach with air-fluid distention of the more proximal stomach.  This could be focal gastritis or infiltrative disease.  Changes alongside the mid third of the descending colon which could be due to mild colitis/diverticulitis or fat scaring from diverticular diseases.      Assessment & Plan:   Principal Problem:   COVID-19 virus infection Active Problems:   HTN (hypertension)   GERD (gastroesophageal reflux disease)   CKD (chronic kidney disease) stage 3, GFR 30-59 ml/min (HCC)   COPD (chronic obstructive pulmonary disease) (HCC)   Dementia with behavioral disturbance (HCC)   HLD (hyperlipidemia)   Rheumatoid arthritis involving multiple sites ([place])   AAA (abdominal aortic aneurysm) (HCC)   Dilation of stomach   Chronic diarrhea   1-Covid 19 Viral Infection:  COPD: Hypoxic overnight. Placed on 2 L oxygen.  Continue with Paxlovid  Started  Oral dexamethasone.  Started  Guaifenesin and duoneb.  Off oxygen today.   Gallbladder dilation with a stone pericholecystic fluid on CT scan:  [place] : poor Quality.  Patient started to eat. No nausea, vomiting or abdominal pain.  Plan to monitor for now.   Tolerating diet, no pain.  LFT trending down.   Abnormal stomach finding on CT scan: Could be focal gastritis or infiltrating disease Started on PPI.  Out patient follow up if family wishes to purse further work up./   HLD; Resume fish oil at discharge   Transaminases.  Suspect related to covid.  Monitor.  Trending down.   AAA; FU out patient.   Chronic diarrhea: PRN Imodium/   Hypertension: Continue with metoprolol./   CKD stage IIIb: Cr range 1.6 Monitor renal function.   Dementia  with behavioral disturbance: Continue with Nemanda and Seroquel.   Moderate arthritis On Plaquenil/       Estimated body mass index is 28.88 kg/m as calculated from the following:   Height as of this encounter: 5\' 10"  (1.778 m).   Weight as of this encounter: 91.3 kg.   DVT prophylaxis: Lovnox Code Status: Full code Family Communication: Daughter at bedside.  Disposition Plan:  Status is: Inpatient Remains inpatient appropriate because: management of Covid viral illness, home tomorrow.     Consultants:  None  Procedures:  None  Antimicrobials:    Subjective: He is alert, sitting recliner. Breathing better, cough better. Off oxygen   Objective: Vitals:   01/03/23 2249 01/04/23 0415 01/04/23 0752 01/04/23 1139  BP: (!) 123/54 138/72  128/67  Pulse: (!) 59 71  80  Resp:  16  (!) 22  Temp:  97.7 F (36.5 C)  97.6 F (36.4 C)  TempSrc:  Oral  Oral  SpO2:  95% 95% 98%  Weight:      Height:        Intake/Output Summary (Last 24 hours) at 01/04/2023 1451 Last data filed at  01/04/2023 1300 Gross per 24 hour  Intake 720 ml  Output 1650 ml  Net -930 ml    Filed Weights   01/02/23 0138  Weight: 91.3 kg    Examination:  General exam: NAD Respiratory system: BL air movement.  Cardiovascular system: S 1, S 2 RRR Gastrointestinal system: BS present, soft, nt Central nervous system: Alert, follows command Extremities: No edema    Data Reviewed: I have  personally reviewed following labs and imaging studies  CBC: Recent Labs  Lab 01/02/23 0256 01/02/23 0312 01/03/23 0441  WBC 9.6  --  8.1  NEUTROABS 8.4*  --   --   HGB 12.1* 12.6* 11.6*  HCT 37.9* 37.0* 35.6*  MCV 93.8  --  92.2  PLT 214  --  0000000    Basic Metabolic Panel: Recent Labs  Lab 01/02/23 0256 01/02/23 0312 01/03/23 0441 01/04/23 0901  NA 136 139 137 137  K 4.2 4.3 4.2 3.9  CL 105 103 106 105  CO2 26  --  24 21*  GLUCOSE 116* 114* 137* 119*  BUN 24* 24* 25* 34*  CREATININE 1.49* 1.70* 1.32*  1.43* 1.38*  CALCIUM 8.7*  --  8.5* 8.5*    GFR: Estimated Creatinine Clearance: 38 mL/min (A) (by C-G formula based on SCr of 1.38 mg/dL (H)). Liver Function Tests: Recent Labs  Lab 01/02/23 0256 01/03/23 0441 01/04/23 0901  AST 24 49* 31  ALT 73* 55* 48*  ALKPHOS 76 66 70  BILITOT 0.4 0.5 0.6  PROT 6.6 5.9* 6.2*  ALBUMIN 3.7 3.2* 3.3*    No results for input(s): "LIPASE", "AMYLASE" in the last 168 hours. No results for input(s): "AMMONIA" in the last 168 hours. Coagulation Profile: Recent Labs  Lab 01/02/23 0256  INR 1.1    Cardiac Enzymes: No results for input(s): "CKTOTAL", "CKMB", "CKMBINDEX", "TROPONINI" in the last 168 hours. BNP (last 3 results) No results for input(s): "PROBNP" in the last 8760 hours. HbA1C: No results for input(s): "HGBA1C" in the last 72 hours. CBG: No results for input(s): "GLUCAP" in the last 168 hours. Lipid Profile: No results for input(s): "CHOL", "HDL", "LDLCALC", "TRIG", "CHOLHDL", "LDLDIRECT" in the last 72 hours. Thyroid Function Tests: No results for input(s): "TSH", "T4TOTAL", "FREET4", "T3FREE", "THYROIDAB" in the last 72 hours. Anemia Panel: No results for input(s): "VITAMINB12", "FOLATE", "FERRITIN", "TIBC", "IRON", "RETICCTPCT" in the last 72 hours. Sepsis Labs: Recent Labs  Lab 01/02/23 0256  LATICACIDVEN 1.1     Recent Results (from the past 240 hour(s))  Resp panel by RT-PCR (RSV, Flu A&B,  Covid) Anterior Nasal Swab     Status: Abnormal   Collection Time: 01/02/23  2:50 AM   Specimen: Anterior Nasal Swab  Result Value Ref Range Status   SARS Coronavirus 2 by RT PCR POSITIVE (A) NEGATIVE Final    Comment: (NOTE) SARS-CoV-2 target nucleic acids are DETECTED.  The SARS-CoV-2 RNA is generally detectable in upper respiratory specimens during the acute phase of infection. Positive results are indicative of the presence of the identified virus, but do not rule out bacterial infection or co-infection with other pathogens not detected by the test. Clinical correlation with patient history and other diagnostic information is necessary to determine patient infection status. The expected result is Negative.  Fact Sheet for Patients: EntrepreneurPulse.com.au  Fact Sheet for Healthcare Providers: IncredibleEmployment.be  This test is not yet approved or cleared by the [place] FDA and  has been authorized for detection and/or diagnosis of SARS-CoV-2 by FDA under  an Emergency Use Authorization (EUA).  This EUA will remain in effect (meaning this test can be used) for the duration of  the COVID-19 declaration under Section 564(b)(1) of the A ct, 21 U.S.C. section 360bbb-3(b)(1), unless the authorization is terminated or revoked sooner.     Influenza A by PCR NEGATIVE NEGATIVE Final   Influenza B by PCR NEGATIVE NEGATIVE Final    Comment: (NOTE) The Xpert Xpress SARS-CoV-2/FLU/RSV plus assay is intended as an aid in the diagnosis of influenza from Nasopharyngeal swab specimens and should not be used as a sole basis for treatment. Nasal washings and aspirates are unacceptable for Xpert Xpress SARS-CoV-2/FLU/RSV testing.  Fact Sheet for Patients: EntrepreneurPulse.com.au  Fact Sheet for Healthcare Providers: IncredibleEmployment.be  This test is not yet approved or cleared by the [place] FDA  and has been authorized for detection and/or diagnosis of SARS-CoV-2 by FDA under an Emergency Use Authorization (EUA). This EUA will remain in effect (meaning this test can be used) for the duration of the COVID-19 declaration under Section 564(b)(1) of the Act, 21 U.S.C. section 360bbb-3(b)(1), unless the authorization is terminated or revoked.     Resp Syncytial Virus by PCR NEGATIVE NEGATIVE Final    Comment: (NOTE) Fact Sheet for Patients: EntrepreneurPulse.com.au  Fact Sheet for Healthcare Providers: IncredibleEmployment.be  This test is not yet approved or cleared by the [place] FDA and has been authorized for detection and/or diagnosis of SARS-CoV-2 by FDA under an Emergency Use Authorization (EUA). This EUA will remain in effect (meaning this test can be used) for the duration of the COVID-19 declaration under Section 564(b)(1) of the Act, 21 U.S.C. section 360bbb-3(b)(1), unless the authorization is terminated or revoked.  Performed at [facility], [place] [address] [place], [place] [address]   Blood Culture (routine x 2)     Status: None (Preliminary result)   Collection Time: 01/02/23  2:56 AM   Specimen: BLOOD  Result Value Ref Range Status   Specimen Description   Final    BLOOD LEFT ANTECUBITAL Performed at [facility] [address] [place], [place] [address]    Special Requests   Final    BOTTLES DRAWN AEROBIC AND ANAEROBIC Blood Culture adequate volume Performed at [facility] [address], [place], [place] [address]    Culture   Final    NO GROWTH 2 DAYS Performed at [facility] [address] [place], [place] [address]    Report Status PENDING  Incomplete  Blood Culture (routine x 2)     Status: None (Preliminary result)   Collection Time: 01/02/23  3:17 AM   Specimen: BLOOD  Result Value Ref Range Status   Specimen Description   Final     BLOOD RIGHT ANTECUBITAL Performed at [facility] [address], [place], [place] [address]    Special Requests   Final    BOTTLES DRAWN AEROBIC AND ANAEROBIC Blood Culture adequate volume Performed at [facility] [address] [place], [place] [address]    Culture   Final    NO GROWTH 2 DAYS Performed at [facility] [address], [place], [place] [address]    Report Status PENDING  Incomplete         Radiology Studies: No results found.      Scheduled Meds:  aspirin EC  81 mg Oral Daily   cyanocobalamin  2,500 mcg Oral Daily   dexamethasone  6  mg Oral Daily   enoxaparin (LOVENOX) injection  40 mg Subcutaneous Q24H   guaiFENesin  600 mg Oral BID   hydroxychloroquine  200 mg Oral QODAY   hydroxychloroquine  400 mg Oral QODAY   ipratropium-albuterol  3 mL Nebulization BID   levothyroxine  25 mcg Oral Q0600   memantine  10 mg Oral BID   metoprolol succinate  12.5 mg Oral QHS   modafinil  100 mg Oral Daily   multivitamin with minerals  1 tablet Oral Daily   nirmatrelvir/ritonavir (renal dosing)  2 tablet Oral BID   pantoprazole  40 mg Oral Daily   QUEtiapine  25 mg Oral QHS   Continuous Infusions:   LOS: 2 days    Time spent: 35 minutes    Quintrell Baze A Mikela Senn, MD [facility]   If 7PM-7AM, please contact night-coverage [email or web address]  01/04/2023, 2:51 PM

## 2023-01-04 NOTE — Evaluation (Addendum)
Physical Therapy Evaluation Patient Details Name: Devin Vance MRN: [id number] DOB: 11-Nov-1927 Today's Date: 01/04/2023  History of Present Illness  89 year old with past medical history significant for Alzheimer's disease, CAD, history of angina pectoris, cardiomyopathy unspecified, rheumatoid arthritis, polymyalgia rheumatica, COPD, GERD, hyperlipidemia, hypertension, osteoporosis, prostate cancer, secondary parkinsonism disease, sleep apnea who presents 01/02/23 complaining of fever, cough, chronic diarrhea, generalized weakness since the day of admission. Positive for Covid.  Clinical Impression  Pt admitted with above diagnosis.  Pt currently with functional limitations due to the deficits listed below (see PT Problem List). Pt will benefit from acute skilled PT to increase their independence and safety with mobility to allow discharge.   The patient expressing concern that his"mother" is dying(wife). PCA attempted to explain to patient  wife is OK, patient became more agitated while ambulating with rollator, having a  brief episode of not following, directions and becoming unsafe while standing, letting go of rollator. Patient was redirected and able to safely ambulate back to the bed. Per PCA in room, patient ambulates with supervision to meals, attends exercise classes and billiards. PCA stays during day, wife is caregiver at night.  SPO2 96%, HR 90.  Continue PT for mobility, patient will be best served to return home with HHPT and 24/7 caregivers.sign         Recommendations for follow up therapy are one component of a multi-disciplinary discharge planning process, led by the attending physician.  Recommendations may be updated based on patient status, additional functional criteria and insurance authorization.  Follow Up Recommendations Home health PT      Assistance Recommended at Discharge Frequent or constant Supervision/Assistance  Patient can return home with the following  A  little help with walking and/or transfers;A little help with bathing/dressing/bathroom;Direct supervision/assist for medications management;Assist for transportation    Equipment Recommendations None recommended by PT  Recommendations for Other Services       Functional Status Assessment Patient has had a recent decline in their functional status and demonstrates the ability to make significant improvements in function in a reasonable and predictable amount of time.     Precautions / Restrictions Precautions Precautions: Fall      Mobility  Bed Mobility Overal bed mobility: Needs Assistance Bed Mobility: Supine to Sit, Sit to Supine     Supine to sit: Min guard Sit to supine: Mod assist   General bed mobility comments: multimodal cues to move to sitting , assisted back into bed  with assist for legs .    Transfers Overall transfer level: Needs assistance Equipment used: Rollator (4 wheels) Transfers: Sit to/from Stand Sit to Stand: Min assist, From elevated surface, +2 safety/equipment           General transfer comment: bed raised , stands at rollator. cues to reach back, patient sat quickly when reached foot of bed. patient able to scoot along bed edge up to [facility] with cues.    Ambulation/Gait Ambulation/Gait assistance: Min assist, Mod assist Gait Distance (Feet): 20 Feet Assistive device: Rollator (4 wheels) Gait Pattern/deviations: Step-to pattern, Staggering left, Staggering right Gait velocity: decr     General Gait Details: multimodal cues for safety.  Initially ambulated with min assist, became more agitated about lack of info, let go of /rollator, therapist supporting patient with posterior  lean, actulally leaning against wal. Patient redirected and able to  start ambulating agiain back to bed side.  Stairs            Emergency planning/management officer  Modified Rankin (Stroke Patients Only)       Balance Overall balance assessment: Needs  assistance Sitting-balance support: Feet supported, No upper extremity supported Sitting balance-Leahy Scale: Fair     Standing balance support: Bilateral upper extremity supported, During functional activity, Reliant on assistive device for balance Standing balance-Leahy Scale: Poor Standing balance comment: most likely due to focusing on "mama being near death." and difficult to redirect, alleviate patient concern                             Pertinent Vitals/Pain Pain Assessment Pain Assessment: No/denies pain    Home Living Family/patient expects to be discharged to:: Private residence Living Arrangements: Spouse/significant other;Non-relatives/Friends;Children Available Help at Discharge: Family;Personal care attendant;Available 24 hours/day Type of Home: Independent living facility Home Access: Level entry;Elevator       Home Layout: One level Home Equipment: Rollator (4 wheels) Additional Comments: I    Prior Function Prior Level of Function : Needs assist  Cognitive Assist : Mobility (cognitive);ADLs (cognitive) Mobility (Cognitive): Step by step cues ADLs (Cognitive): Step by step cues Physical Assist : Mobility (physical) Mobility (physical): Bed mobility;Transfers;Gait   Mobility Comments: PCA during day, wife  caregiver at night, ambulates with rollator, ADLs Comments: PCA assists with bath, toileting, and dressing, able to feed self     Hand Dominance        Extremity/Trunk Assessment   Upper Extremity Assessment Upper Extremity Assessment: Overall WFL for tasks assessed    Lower Extremity Assessment Lower Extremity Assessment: Generalized weakness    Cervical / Trunk Assessment Cervical / Trunk Assessment: Kyphotic  Communication   Communication: No difficulties  Cognition Arousal/Alertness: Awake/alert Behavior During Therapy: Agitated, Restless Overall Cognitive Status: Impaired/Different from baseline Area of Impairment:  Orientation, Following commands                       Following Commands: Follows one step commands inconsistently       General Comments: per caregiver in room, current MS is different, patient is not agitated and able to follow simple directions at baseline. At this time,patient focused on  thinking his 'Mother/wife" is dying and noone is giving him information. Patient agitated while walking and not following instructions, letting go of Rollator. PCA able to redirect and get patient to ambulate back to the bed. Family called to room  and spoke with patient, noted less anxious and able to be redirected and return to supine.        General Comments      Exercises     Assessment/Plan    PT Assessment Patient needs continued PT services  PT Problem List Decreased strength;Decreased mobility;Decreased safety awareness;Decreased knowledge of precautions;Decreased cognition;Decreased balance       PT Treatment Interventions DME instruction;Therapeutic activities;Cognitive remediation;Gait training;Therapeutic exercise;Patient/family education;Functional mobility training    PT Goals (Current goals can be found in the Care Plan section)  Acute Rehab PT Goals Patient Stated Goal: to return  home PT Goal Formulation: With family Time For Goal Achievement: 01/18/23 Potential to Achieve Goals: Good    Frequency Min 3X/week     Co-evaluation               AM-PAC PT "6 Clicks" Mobility  Outcome Measure Help needed turning from your back to your side while in a flat bed without using bedrails?: A Little Help needed moving from lying on your back to sitting on the  side of a flat bed without using bedrails?: A Lot Help needed moving to and from a bed to a chair (including a wheelchair)?: A Lot Help needed standing up from a chair using your arms (e.g., wheelchair or bedside chair)?: A Lot Help needed to walk in hospital room?: Total Help needed climbing 3-5 steps with a  railing? : Total 6 Click Score: 11    End of Session Equipment Utilized During Treatment: Gait belt Activity Tolerance: Treatment limited secondary to agitation Patient left: in bed;with call bell/phone within reach;with nursing/sitter in room Nurse Communication: Mobility status PT Visit Diagnosis: Unsteadiness on feet (R26.81);Difficulty in walking, not elsewhere classified (R26.2)    Time: [id number] PT Time Calculation (min) (ACUTE ONLY): 27 min   Charges:   PT Evaluation $PT Eval Low Complexity: 1 Low PT Treatments $Gait Training: 8-22 mins        [place] Office [phone] Weekend [id number]   Claretha Cooper 01/04/2023, 2:43 PM

## 2023-01-05 DIAGNOSIS — U071 COVID-19: Secondary | ICD-10-CM | POA: Diagnosis not present

## 2023-01-05 MED ORDER — PANTOPRAZOLE SODIUM 40 MG PO TBEC: 40.0000 mg | DELAYED_RELEASE_TABLET | Freq: Every day | ORAL | 0 refills | Status: AC

## 2023-01-05 MED ORDER — NIRMATRELVIR/RITONAVIR (PAXLOVID) TABLET (RENAL DOSING): 2.0000 | ORAL_TABLET | Freq: Two times a day (BID) | ORAL | 0 refills | Status: AC

## 2023-01-05 MED ORDER — GUAIFENESIN ER 600 MG PO TB12: 600.0000 mg | ORAL_TABLET | Freq: Two times a day (BID) | ORAL | 0 refills | Status: AC

## 2023-01-05 MED ORDER — IPRATROPIUM-ALBUTEROL 0.5-2.5 (3) MG/3ML IN SOLN: 3.0000 mL | Freq: Two times a day (BID) | RESPIRATORY_TRACT | 0 refills | Status: AC

## 2023-01-05 MED ORDER — DEXAMETHASONE 6 MG PO TABS: 6.0000 mg | ORAL_TABLET | Freq: Every day | ORAL | 0 refills | Status: AC

## 2023-01-05 NOTE — Plan of Care (Signed)
  Problem: Education: Goal: Knowledge of risk factors and measures for prevention of condition will improve Outcome: Progressing   Problem: Coping: Goal: Psychosocial and spiritual needs will be supported Outcome: Progressing   Problem: Respiratory: Goal: Will maintain a patent airway Outcome: Progressing Goal: Complications related to the disease process, condition or treatment will be avoided or minimized Outcome: Progressing   

## 2023-01-05 NOTE — Discharge Summary (Signed)
Physician Discharge Summary   Patient: Devin Vance MRN: [id number] DOB: 10-Aug-1928  Admit date:     01/02/2023  Discharge date: 01/05/23  Discharge Physician: Elmarie Shiley   PCP: Wenda Low, MD   Recommendations at discharge:    Follow up resolution of Covid.  Needs follow up LFT   Discharge Diagnoses: Principal Problem:   COVID-19 virus infection Active Problems:   HTN (hypertension)   GERD (gastroesophageal reflux disease)   CKD (chronic kidney disease) stage 3, GFR 30-59 ml/min (HCC)   COPD (chronic obstructive pulmonary disease) (HCC)   Dementia with behavioral disturbance (HCC)   HLD (hyperlipidemia)   Rheumatoid arthritis involving multiple sites ([place])   AAA (abdominal aortic aneurysm) (HCC)   Dilation of stomach   Chronic diarrhea  Resolved Problems:   * No resolved hospital problems. *  Hospital Course: 89 year old with past medical history significant for Alzheimer's disease, CAD, history of angina pectoris, cardiomyopathy unspecified, rheumatoid arthritis, polymyalgia rheumatica, COPD, GERD, hyperlipidemia, hypertension, osteoporosis, prostate cancer, secondary parkinsonism disease, sleep apnea who presents complaining of fever, cough, chronic diarrhea, generalized weakness since the day of admission.     Evaluation in the ED:  he was found to be positive for COVID, CT renal study showed gallbladder dilation with a stone suspected, wall thickening and pericholecystic edema near circumferential irregular wall prominence at the junction of the body and antrum of the stomach with air-fluid distention of the more proximal stomach.  This could be focal gastritis or infiltrative disease.  Changes alongside the mid third of the descending colon which could be due to mild colitis/diverticulitis or fat scaring from diverticular diseases.       Assessment and Plan: 1-Covid 19 Viral Infection:  COPD: Hypoxic overnight. Placed on 2 L oxygen.  Continue with Paxlovid  , complete 5 days.  Started  Oral dexamethasone. Plan to prescribe 5 more days.  Started  Guaifenesin and duoneb.  Off oxygen today.  Stable for discharge.   Gallbladder dilation with a stone pericholecystic fluid on CT scan:  [place] : poor Quality.  Patient started to eat. No nausea, vomiting or abdominal pain.  Clinically no evidence of active gallbladder issues.  Tolerating diet, no pain.  LFT trending down.  FU out patient.   Abnormal stomach finding on CT scan: Could be focal gastritis or infiltrating disease Started on PPI.  Out patient follow up if family wishes to purse further work up./    HLD; Resume fish oil at discharge    Transaminases.  Suspect related to covid.  Monitor.  Trending down.    AAA; FU out patient.    Chronic diarrhea: PRN Imodium/    Hypertension: Continue with metoprolol./    CKD stage IIIb: Cr range 1.6 Monitor renal function.    Dementia  with behavioral disturbance: Continue with Nemanda and Seroquel.    Moderate arthritis On Plaquenil/                 Consultants: None Procedures performed: None  Disposition: Home Diet recommendation:  Discharge Diet Orders (From admission, onward)     Start     Ordered   01/05/23 0000  Diet - low sodium heart healthy        01/05/23 1037           Regular diet DISCHARGE MEDICATION: Allergies as of 01/05/2023       Reactions   Lorazepam Other (See Comments)   Generalized weakness        Medication  List     TAKE these medications    aspirin EC 81 MG tablet Take 81 mg by mouth daily.   dexamethasone 6 MG tablet Commonly known as: DECADRON Take 1 tablet (6 mg total) by mouth daily for 5 days.   Fish Oil 1200 MG Caps Take 1,200 mg by mouth daily.   FLONASE ALLERGY RELIEF NA Place 2 sprays into both nostrils daily as needed (Rhinitis).   guaiFENesin 600 MG 12 hr tablet Commonly known as: MUCINEX Take 1 tablet (600 mg total) by mouth 2 (two) times daily.    hydroxychloroquine 200 MG tablet Commonly known as: PLAQUENIL Take 200 mg by mouth See admin instructions. Take 200 mg (1 tablet) every other day, alternating with 400 mg (2 tablets) every other day.   ipratropium-albuterol 0.5-2.5 (3) MG/3ML Soln Commonly known as: DUONEB Take 3 mLs by nebulization 2 (two) times daily.   ketoconazole 2 % shampoo Commonly known as: NIZORAL Apply 1 Application topically as needed for irritation.   levothyroxine 25 MCG tablet Commonly known as: SYNTHROID Take 25 mcg by mouth daily before breakfast.   memantine 10 MG tablet Commonly known as: NAMENDA Take 1 tablet (10 mg total) by mouth 2 (two) times daily.   metoprolol succinate 25 MG 24 hr tablet Commonly known as: TOPROL-XL Take 12.5 mg by mouth daily.   modafinil 100 MG tablet Commonly known as: PROVIGIL Take 1 tablet (100 mg total) by mouth daily.   multivitamin capsule Take 1 capsule by mouth daily.   nirmatrelvir/ritonavir (renal dosing) 10 x 150 MG & 10 x 100MG  Tabs Commonly known as: PAXLOVID Take 2 tablets by mouth 2 (two) times daily for 2 days. Patient GFR is . Take nirmatrelvir (150 mg) one tablet twice daily for 5 days and ritonavir (100 mg) one tablet twice daily for 5 days.   pantoprazole 40 MG tablet Commonly known as: PROTONIX Take 1 tablet (40 mg total) by mouth daily.   PROBIOTIC ACIDOPHILUS PO Take 1 tablet by mouth daily.   QUEtiapine 25 MG tablet Commonly known as: SEROQUEL Take 1 tablet (25 mg total) by mouth at bedtime.   Vitamin B12 1000 MCG Tbcr Take 2,500 mcg by mouth daily.   VITAMIN D-3 PO Take 1,000 Units by mouth daily.               Durable Medical Equipment  (From admission, onward)           Start     Ordered   01/05/23 1038  For home use only DME Nebulizer machine  Once       Question Answer Comment  Patient needs a nebulizer to treat with the following condition Bronchitis   Length of Need 6 Months      01/05/23 1037             Follow-up Information     Wenda Low, MD Follow up in 1 week(s).   Specialty: Internal Medicine Contact information: [address] [place] [address] [phone]         [facility] Follow up.   Why: A representative with [facility] [place] [facility] will contact you regarding your DME: nebulizer Contact information: [place] [address] [phone]         [facility] Follow up.   Why: A representative with [facility] will contact you regarding your home health physical therapy and occupational therapy services within 24-48 hours from the date of discharge. Contact information: [address], [place] [address]               Discharge Exam: Danley Danker Weights   01/02/23 0138  Weight: 91.3 kg   General; NAD  Condition at discharge: stable  The results of significant diagnostics from this hospitalization (including imaging, microbiology, ancillary and laboratory) are listed below for reference.   Imaging Studies: US Abdomen Limited RUQ (LIVER/GB)  Result Date: 01/02/2023 CLINICAL DATA:  89 year old male with history of abdominal pain. EXAM: ULTRASOUND ABDOMEN LIMITED RIGHT UPPER QUADRANT COMPARISON:  CT of the abdomen and pelvis 01/02/2023. FINDINGS: Comment: Today's study is exceedingly limited secondary to poor sonographic windows (study is very limited by the presence of extensive bowel gas, presumably related to colonic interposition as demonstrated on prior CT examination). Gallbladder: Poorly visualized. Common bile duct: Could not be definitively visualized. Liver: Poorly visualized. Other: None. IMPRESSION: 1. Poor quality essentially nondiagnostic study secondary to bowel gas interposed between the transducer in the liver/gallbladder. If there is clinical concern for acute cholecystitis, further evaluation with nuclear medicine hepatobiliary scan should be considered. Electronically Signed   By:  Vinnie Langton M.D.   On: 01/02/2023 06:58   CT Renal Stone Study  Result Date: 01/02/2023 CLINICAL DATA:  Abdominal and flank pain, fever, weakness and diarrhea. EXAM: CT ABDOMEN AND PELVIS WITHOUT CONTRAST TECHNIQUE: Multidetector CT imaging of the abdomen and pelvis was performed following the standard protocol without IV contrast. RADIATION DOSE REDUCTION: This exam was performed according to the departmental dose-optimization program which includes automated exposure control, adjustment of the mA and/or kV according to patient size and/or use of iterative reconstruction technique. COMPARISON:  None Available. FINDINGS: Lower chest: Respiratory motion artifact limits evaluation of the lung bases. There is diffuse bronchial thickening. There is asymmetric opacity in the hypoinflated left lower lobe base which could be atelectasis or pneumonia. No other focal lung base abnormality is seen. Calcifications are scattered along the aortic valve leaflets. There are three-vessel coronary artery calcifications heaviest in the LAD and mild cardiomegaly. No pericardial effusion. Descending aortic atherosclerosis. Hepatobiliary: Respiratory motion also reduces fine detail in the abdomen, but the gallbladder does appear abnormal. It is dilated to 12 cm with multiple stones and there is suspected at least mild wall thickening with pericholecystic edema. The findings may be exaggerated due to breathing motion but ultrasound follow-up is warranted. No abnormality of the unenhanced liver is seen through the breathing motion. Pancreas: Partially atrophic. No other abnormality is seen through the breathing motion. Spleen: No abnormality is seen through the breathing motion. Adrenals/Urinary Tract: There is no adrenal mass. No focal abnormality in the unenhanced renal cortex. No urinary stones are visible through the breathing motion. There is no hydronephrosis or ureteral stone. The bladder thickness is normal.  Stomach/Bowel: There is a near circumferential irregular wall prominence at the junction of the body and antrum of the stomach, with air-fluid distention of the more proximal stomach. This could be due to focal gastritis or infiltrating disease. Upper endoscopy is recommended. The unopacified small bowel and appendix are unremarkable. There is moderate fecal stasis ascending and proximal transverse colon. Scattered colonic diverticulosis greatest in the sigmoid. There are faint stranding changes alongside the mid third of the descending colon, which could be due to colitis/diverticulitis or fat scarring from old diverticular disease. No other focal pericolic process is seen. Vascular/Lymphatic: Moderate to heavy aortoiliac calcific plaque. Fusiform infrarenal AAA measures 3.0 x 2.7 cm AP and transverse on 3:50, and extends for a length  of 4 cm. No other vascular aneurysm is seen. Reproductive: Prior radical prostatectomy. Numerous surgical clips line the pelvic sidewalls and floor. No mass is seen in the prostate bed. Other: No abdominal wall hernia or abnormality. No abdominopelvic ascites. There is no free air or abscess. Musculoskeletal: Osteopenia with degenerative change thoracic and lumbar spine. Unilateral ankylosis right SI joint. No focal pathologic bone lesion. IMPRESSION: 1. Motion limited study. 2. Gallbladder dilatation with stones, suspected wall thickening and pericholecystic edema. Ultrasound follow-up is warranted. 3. Near circumferential irregular wall prominence at the junction of the body and antrum of the stomach with air-fluid distention of the more proximal stomach. This could be due to focal gastritis or infiltrating disease. Upper endoscopy is recommended. 4. Faint stranding changes alongside the mid third of the descending colon which could be due to a mild colitis/diverticulitis or fat scarring from old diverticular disease. 5. Constipation and diverticulosis. 6. Aortic and coronary artery  atherosclerosis. 7. 3.0 x 2.7 cm fusiform infrarenal AAA. Recommend follow-up every 3 years. 8. Bronchitis with asymmetric opacity in the hypoinflated left lower lobe base which could be atelectasis or pneumonia. 9. Prior radical prostatectomy. No mass is seen in the prostate bed. 10. Osteopenia and degenerative change. Aortic Atherosclerosis (ICD10-I70.0). Electronically Signed   By: Telford Nab M.D.   On: 01/02/2023 04:42   CT Head Wo Contrast  Result Date: 01/02/2023 CLINICAL DATA:  89 year old male with fever, weakness, diarrhea, altered mental status. EXAM: CT HEAD WITHOUT CONTRAST TECHNIQUE: Contiguous axial images were obtained from the base of the skull through the vertex without intravenous contrast. RADIATION DOSE REDUCTION: This exam was performed according to the departmental dose-optimization program which includes automated exposure control, adjustment of the mA and/or kV according to patient size and/or use of iterative reconstruction technique. COMPARISON:  Brain MRI 09/07/2017.  Head CT 02/16/2021. FINDINGS: Study is mildly degraded by motion artifact despite repeated imaging attempts. Brain: Stable cerebral volume since 2022. No midline shift, ventriculomegaly, mass effect, evidence of mass lesion, intracranial hemorrhage or evidence of cortically based acute infarction. Stable and normal for age gray-white matter differentiation throughout the brain. No cortical encephalomalacia identified. Vascular: Calcified atherosclerosis at the skull base. Skull: No acute osseous abnormality identified. Sinuses/Orbits: Mild chronic left mastoid effusion is stable. Other visualized paranasal sinuses and mastoids are stable and well aerated. Tympanic cavities are clear. Other: No acute orbit or scalp soft tissue finding. IMPRESSION: 1. Mildly motion degraded, but negative for age non contrast CT appearance of the brain. 2. Mild chronic left mastoid effusion. Electronically Signed   By: Genevie Ann M.D.   On:  01/02/2023 04:14   DG Chest Port 1 View  Result Date: 01/02/2023 CLINICAL DATA:  QUESTIONABLE SEPSIS WITH COUGH, FEVER, NAUSEA AND DIARRHEA. EXAM: PORTABLE CHEST 1 VIEW COMPARISON:  PORTABLE CHEST 05/08/2022 FINDINGS: Due to positioning the patient's chin and neck obscure the lung apices. The lungs are expiratory. The stomach is gas distended as before. Peribronchial cuffing continues to be noted with perihilar linear atelectasis. The visualized lungs are clear. The lower lung zones are obscured due to expiration. The sulci are sharp. The mediastinal configuration is stable. There is aortic atherosclerosis. Normal cardiac size and central vessels. There is thoracic spondylosis and osteopenia. No acute osseous findings. Overlying monitor wires. IMPRESSION: 1. Limited study due to positioning and expiration. 2. Peribronchial cuffing with perihilar linear atelectasis. 3. No convincing new lung opacities. 4. The stomach gas distended as before. Electronically Signed   By: Ninfa [place].D.  On: 01/02/2023 03:23    Microbiology: Results for orders placed or performed during the hospital encounter of 01/02/23  Resp panel by RT-PCR (RSV, Flu A&B, Covid) Anterior Nasal Swab     Status: Abnormal   Collection Time: 01/02/23  2:50 AM   Specimen: Anterior Nasal Swab  Result Value Ref Range Status   SARS Coronavirus 2 by RT PCR POSITIVE (A) NEGATIVE Final    Comment: (NOTE) SARS-CoV-2 target nucleic acids are DETECTED.  The SARS-CoV-2 RNA is generally detectable in upper respiratory specimens during the acute phase of infection. Positive results are indicative of the presence of the identified virus, but do not rule out bacterial infection or co-infection with other pathogens not detected by the test. Clinical correlation with patient history and other diagnostic information is necessary to determine patient infection status. The expected result is Negative.  Fact Sheet for  Patients: EntrepreneurPulse.com.au  Fact Sheet for Healthcare Providers: IncredibleEmployment.be  This test is not yet approved or cleared by the [place] FDA and  has been authorized for detection and/or diagnosis of SARS-CoV-2 by FDA under an Emergency Use Authorization (EUA).  This EUA will remain in effect (meaning this test can be used) for the duration of  the COVID-19 declaration under Section 564(b)(1) of the A ct, 21 U.S.C. section 360bbb-3(b)(1), unless the authorization is terminated or revoked sooner.     Influenza A by PCR NEGATIVE NEGATIVE Final   Influenza B by PCR NEGATIVE NEGATIVE Final    Comment: (NOTE) The Xpert Xpress SARS-CoV-2/FLU/RSV plus assay is intended as an aid in the diagnosis of influenza from Nasopharyngeal swab specimens and should not be used as a sole basis for treatment. Nasal washings and aspirates are unacceptable for Xpert Xpress SARS-CoV-2/FLU/RSV testing.  Fact Sheet for Patients: EntrepreneurPulse.com.au  Fact Sheet for Healthcare Providers: IncredibleEmployment.be  This test is not yet approved or cleared by the [place] FDA and has been authorized for detection and/or diagnosis of SARS-CoV-2 by FDA under an Emergency Use Authorization (EUA). This EUA will remain in effect (meaning this test can be used) for the duration of the COVID-19 declaration under Section 564(b)(1) of the Act, 21 U.S.C. section 360bbb-3(b)(1), unless the authorization is terminated or revoked.     Resp Syncytial Virus by PCR NEGATIVE NEGATIVE Final    Comment: (NOTE) Fact Sheet for Patients: EntrepreneurPulse.com.au  Fact Sheet for Healthcare Providers: IncredibleEmployment.be  This test is not yet approved or cleared by the [place] FDA and has been authorized for detection and/or diagnosis of SARS-CoV-2 by FDA under an Emergency Use  Authorization (EUA). This EUA will remain in effect (meaning this test can be used) for the duration of the COVID-19 declaration under Section 564(b)(1) of the Act, 21 U.S.C. section 360bbb-3(b)(1), unless the authorization is terminated or revoked.  Performed at [facility], [place] [address], [place], [place] [address]   Blood Culture (routine x 2)     Status: None (Preliminary result)   Collection Time: 01/02/23  2:56 AM   Specimen: BLOOD  Result Value Ref Range Status   Specimen Description   Final    BLOOD LEFT ANTECUBITAL Performed at [facility] [address] [place], [place] [address]    Special Requests   Final    BOTTLES DRAWN AEROBIC AND ANAEROBIC Blood Culture adequate volume Performed at [facility] [address] [place], [place] [address]    Culture   Final    NO GROWTH 3 DAYS Performed  at [facility], [place] [address] [place], [place] [address]    Report Status PENDING  Incomplete  Blood Culture (routine x 2)     Status: None (Preliminary result)   Collection Time: 01/02/23  3:17 AM   Specimen: BLOOD  Result Value Ref Range Status   Specimen Description   Final    BLOOD RIGHT ANTECUBITAL Performed at [facility] [address] [place], [place] [address]    Special Requests   Final    BOTTLES DRAWN AEROBIC AND ANAEROBIC Blood Culture adequate volume Performed at [facility] [address] [place], [place] [address]    Culture   Final    NO GROWTH 3 DAYS Performed at [facility], [place] [address], [place],  [address]    Report Status PENDING  Incomplete    Labs: CBC: Recent Labs  Lab 01/02/23 0256 01/02/23 0312 01/03/23 0441  WBC 9.6  --  8.1  NEUTROABS 8.4*  --   --   HGB 12.1* 12.6* 11.6*  HCT 37.9* 37.0* 35.6*  MCV 93.8  --  92.2  PLT 214  --  0000000   Basic Metabolic Panel: Recent Labs  Lab 01/02/23 0256 01/02/23 0312  01/03/23 0441 01/04/23 0901  NA 136 139 137 137  K 4.2 4.3 4.2 3.9  CL 105 103 106 105  CO2 26  --  24 21*  GLUCOSE 116* 114* 137* 119*  BUN 24* 24* 25* 34*  CREATININE 1.49* 1.70* 1.32*  1.43* 1.38*  CALCIUM 8.7*  --  8.5* 8.5*   Liver Function Tests: Recent Labs  Lab 01/02/23 0256 01/03/23 0441 01/04/23 0901  AST 24 49* 31  ALT 73* 55* 48*  ALKPHOS 76 66 70  BILITOT 0.4 0.5 0.6  PROT 6.6 5.9* 6.2*  ALBUMIN 3.7 3.2* 3.3*   CBG: No results for input(s): "GLUCAP" in the last 168 hours.  Discharge time spent: greater than 30 minutes.  Signed: Elmarie Shiley, MD [facility] 01/05/2023

## 2023-01-05 NOTE — TOC Transition Note (Signed)
Transition of Care [facility]) - CM/SW Discharge Note   Patient Details  Name: Devin Vance MRN: [id number] Date of Birth: [birth year]  Transition of Care [facility]) CM/SW Contact:  Roseanne Kaufman, RN Phone Number: 01/05/2023, 1:13 PM   Clinical Narrative:   Per chart review HHPT/OT has been arranged with [facility], this RNCM awaiting discharge summary to fax over clinicals to [facility].  This RNCM spoke with patient's daughter Manuela Schwartz to offer choice for [phone], daughter chose any DME supplier to accept patient's insurance. This RNCM notified Pope with [facility] for assistance with DME: nebulizer. [facility] will attempt to deliver nebulizer to patient at bedside, if patient has been discharged prior to deliver [facility] will deliver to patient's home.   Transportation at discharge: patient's daughter    Final next level of care: [place] Barriers to Discharge: No Barriers Identified   Patient Goals and CMS Choice   Choice offered to / list presented to : Patient  Discharge Placement   Home with home health services and DME supplies.                        Discharge Plan and Services Additional resources added to the After Visit Summary for                  DME Arranged: Nebulizer machine DME Agency: [facility]       HH Arranged: OT, PT [place] Agency: Other - See comment ([place]) Date [place]: 01/05/23      Social Determinants of Health (SDOH) Interventions SDOH Screenings   Food Insecurity: No Food Insecurity (01/02/2023)  Housing: Low Risk  (01/02/2023)  Transportation Needs: No Transportation Needs (01/02/2023)  Utilities: Not At Risk (01/02/2023)  Tobacco Use: Low Risk  (01/02/2023)     Readmission Risk Interventions     No data to display

## 2023-01-07 LAB — CULTURE, BLOOD (ROUTINE X 2)
Culture: NO GROWTH
Culture: NO GROWTH
Special Requests: ADEQUATE
Special Requests: ADEQUATE

## 2023-01-08 ENCOUNTER — Telehealth: Payer: Self-pay

## 2023-01-08 NOTE — Telephone Encounter (Signed)
This RNCM received inbound call from patient's daughter Manuela Schwartz who reports [facility] just delivered the nebulizer. The patient's daughter reports she does not have the medication for the nebulizer. This RNCM reviewed the chart and MD sent prescription to [facility]. This RNCM will follow up to see if RX has been sent out.

## 2023-01-08 NOTE — Telephone Encounter (Signed)
This RNCM spoke with [place] with [facility] regarding status of ipratropium-albuterol 0.5-2.5(3)mg/3 ml soln. [facility] reports they have not sent the medication out due to unabe to get in touch with the patient for payment. TOC will continue to follow.

## 2023-01-08 NOTE — Telephone Encounter (Signed)
This RNCM received call from patient's daughter Manuela Schwartz indicating patient has not received nebulizer. This RNCM contacted Erasmo Downer with [facility] to get status on nebulizer,   Awaiting call back

## 2023-01-08 NOTE — Telephone Encounter (Signed)
This RNCM spoke with Devin Vance to provide verbal order for ipratopium-albuterol (Duo neb) 0.5-2.5 (3) mg/3 ml soln, take 55ml by nebulization 2 times daily. [facility] will fill prescription however only has 2 boxes on hand will order remainder of the.     - This RNCM spoke with patient's daughter Devin Vance to advise medications have been called into [facility] [phone], [place]   No additional TOC needs.

## 2023-01-14 DIAGNOSIS — R531 Weakness: Secondary | ICD-10-CM | POA: Diagnosis not present

## 2023-01-14 DIAGNOSIS — N1831 Chronic kidney disease, stage 3a: Secondary | ICD-10-CM | POA: Diagnosis not present

## 2023-01-14 DIAGNOSIS — K8020 Calculus of gallbladder without cholecystitis without obstruction: Secondary | ICD-10-CM | POA: Diagnosis not present

## 2023-01-14 DIAGNOSIS — U071 COVID-19: Secondary | ICD-10-CM | POA: Diagnosis not present

## 2023-01-14 DIAGNOSIS — G309 Alzheimer's disease, unspecified: Secondary | ICD-10-CM | POA: Diagnosis not present

## 2023-01-14 DIAGNOSIS — I714 Abdominal aortic aneurysm, without rupture, unspecified: Secondary | ICD-10-CM | POA: Diagnosis not present

## 2023-01-14 DIAGNOSIS — R7989 Other specified abnormal findings of blood chemistry: Secondary | ICD-10-CM | POA: Diagnosis not present

## 2023-01-15 ENCOUNTER — Inpatient Hospital Stay (HOSPITAL_COMMUNITY)
Admission: EM | Admit: 2023-01-15 | Discharge: 2023-01-29 | DRG: 871 | Disposition: A | Discharge status: Skilled Nursing Facility | Payer: Medicare Other | Source: Skilled Nursing Facility | Attending: Family Medicine | Admitting: Family Medicine

## 2023-01-15 ENCOUNTER — Emergency Department (HOSPITAL_COMMUNITY): Payer: Medicare Other

## 2023-01-15 ENCOUNTER — Encounter (HOSPITAL_COMMUNITY): Payer: Self-pay

## 2023-01-15 ENCOUNTER — Other Ambulatory Visit: Payer: Self-pay

## 2023-01-15 DIAGNOSIS — Z823 Family history of stroke: Secondary | ICD-10-CM

## 2023-01-15 DIAGNOSIS — C61 Malignant neoplasm of prostate: Secondary | ICD-10-CM | POA: Diagnosis not present

## 2023-01-15 DIAGNOSIS — Z993 Dependence on wheelchair: Secondary | ICD-10-CM

## 2023-01-15 DIAGNOSIS — Z79890 Hormone replacement therapy (postmenopausal): Secondary | ICD-10-CM

## 2023-01-15 DIAGNOSIS — E876 Hypokalemia: Secondary | ICD-10-CM | POA: Diagnosis not present

## 2023-01-15 DIAGNOSIS — M6281 Muscle weakness (generalized): Secondary | ICD-10-CM | POA: Diagnosis not present

## 2023-01-15 DIAGNOSIS — R110 Nausea: Secondary | ICD-10-CM | POA: Diagnosis not present

## 2023-01-15 DIAGNOSIS — R6520 Severe sepsis without septic shock: Secondary | ICD-10-CM | POA: Diagnosis present

## 2023-01-15 DIAGNOSIS — J9811 Atelectasis: Secondary | ICD-10-CM | POA: Diagnosis not present

## 2023-01-15 DIAGNOSIS — A419 Sepsis, unspecified organism: Principal | ICD-10-CM

## 2023-01-15 DIAGNOSIS — N179 Acute kidney failure, unspecified: Secondary | ICD-10-CM | POA: Diagnosis not present

## 2023-01-15 DIAGNOSIS — E872 Acidosis, unspecified: Secondary | ICD-10-CM | POA: Diagnosis present

## 2023-01-15 DIAGNOSIS — I714 Abdominal aortic aneurysm, without rupture, unspecified: Secondary | ICD-10-CM | POA: Diagnosis present

## 2023-01-15 DIAGNOSIS — K8000 Calculus of gallbladder with acute cholecystitis without obstruction: Secondary | ICD-10-CM | POA: Diagnosis not present

## 2023-01-15 DIAGNOSIS — M353 Polymyalgia rheumatica: Secondary | ICD-10-CM | POA: Insufficient documentation

## 2023-01-15 DIAGNOSIS — E785 Hyperlipidemia, unspecified: Secondary | ICD-10-CM | POA: Diagnosis present

## 2023-01-15 DIAGNOSIS — D6959 Other secondary thrombocytopenia: Secondary | ICD-10-CM | POA: Diagnosis not present

## 2023-01-15 DIAGNOSIS — I700 Atherosclerosis of aorta: Secondary | ICD-10-CM | POA: Diagnosis present

## 2023-01-15 DIAGNOSIS — K8010 Calculus of gallbladder with chronic cholecystitis without obstruction: Secondary | ICD-10-CM | POA: Diagnosis not present

## 2023-01-15 DIAGNOSIS — R2689 Other abnormalities of gait and mobility: Secondary | ICD-10-CM | POA: Diagnosis not present

## 2023-01-15 DIAGNOSIS — R1310 Dysphagia, unspecified: Secondary | ICD-10-CM | POA: Diagnosis present

## 2023-01-15 DIAGNOSIS — R1311 Dysphagia, oral phase: Secondary | ICD-10-CM | POA: Diagnosis not present

## 2023-01-15 DIAGNOSIS — G309 Alzheimer's disease, unspecified: Secondary | ICD-10-CM | POA: Diagnosis present

## 2023-01-15 DIAGNOSIS — R404 Transient alteration of awareness: Secondary | ICD-10-CM | POA: Diagnosis not present

## 2023-01-15 DIAGNOSIS — R739 Hyperglycemia, unspecified: Secondary | ICD-10-CM | POA: Diagnosis not present

## 2023-01-15 DIAGNOSIS — U071 COVID-19: Secondary | ICD-10-CM

## 2023-01-15 DIAGNOSIS — F02C18 Dementia in other diseases classified elsewhere, severe, with other behavioral disturbance: Secondary | ICD-10-CM | POA: Diagnosis present

## 2023-01-15 DIAGNOSIS — G9341 Metabolic encephalopathy: Secondary | ICD-10-CM | POA: Diagnosis not present

## 2023-01-15 DIAGNOSIS — E8721 Acute metabolic acidosis: Secondary | ICD-10-CM | POA: Diagnosis not present

## 2023-01-15 DIAGNOSIS — R2681 Unsteadiness on feet: Secondary | ICD-10-CM | POA: Diagnosis not present

## 2023-01-15 DIAGNOSIS — M069 Rheumatoid arthritis, unspecified: Secondary | ICD-10-CM | POA: Diagnosis not present

## 2023-01-15 DIAGNOSIS — G308 Other Alzheimer's disease: Secondary | ICD-10-CM | POA: Diagnosis not present

## 2023-01-15 DIAGNOSIS — Z515 Encounter for palliative care: Secondary | ICD-10-CM | POA: Diagnosis not present

## 2023-01-15 DIAGNOSIS — F02C11 Dementia in other diseases classified elsewhere, severe, with agitation: Secondary | ICD-10-CM | POA: Diagnosis present

## 2023-01-15 DIAGNOSIS — Z66 Do not resuscitate: Secondary | ICD-10-CM | POA: Diagnosis present

## 2023-01-15 DIAGNOSIS — R140 Abdominal distension (gaseous): Secondary | ICD-10-CM | POA: Diagnosis not present

## 2023-01-15 DIAGNOSIS — Z79899 Other long term (current) drug therapy: Secondary | ICD-10-CM

## 2023-01-15 DIAGNOSIS — N1832 Chronic kidney disease, stage 3b: Secondary | ICD-10-CM | POA: Diagnosis present

## 2023-01-15 DIAGNOSIS — Z8616 Personal history of COVID-19: Secondary | ICD-10-CM

## 2023-01-15 DIAGNOSIS — R4789 Other speech disturbances: Secondary | ICD-10-CM | POA: Diagnosis not present

## 2023-01-15 DIAGNOSIS — R000 Tachycardia, unspecified: Secondary | ICD-10-CM | POA: Diagnosis not present

## 2023-01-15 DIAGNOSIS — R079 Chest pain, unspecified: Secondary | ICD-10-CM | POA: Diagnosis not present

## 2023-01-15 DIAGNOSIS — E039 Hypothyroidism, unspecified: Secondary | ICD-10-CM | POA: Diagnosis present

## 2023-01-15 DIAGNOSIS — K529 Noninfective gastroenteritis and colitis, unspecified: Secondary | ICD-10-CM | POA: Diagnosis present

## 2023-01-15 DIAGNOSIS — G4733 Obstructive sleep apnea (adult) (pediatric): Secondary | ICD-10-CM | POA: Diagnosis present

## 2023-01-15 DIAGNOSIS — I10 Essential (primary) hypertension: Secondary | ICD-10-CM | POA: Diagnosis not present

## 2023-01-15 DIAGNOSIS — K219 Gastro-esophageal reflux disease without esophagitis: Secondary | ICD-10-CM | POA: Diagnosis present

## 2023-01-15 DIAGNOSIS — J449 Chronic obstructive pulmonary disease, unspecified: Secondary | ICD-10-CM | POA: Diagnosis present

## 2023-01-15 DIAGNOSIS — G301 Alzheimer's disease with late onset: Secondary | ICD-10-CM | POA: Diagnosis not present

## 2023-01-15 DIAGNOSIS — Z7982 Long term (current) use of aspirin: Secondary | ICD-10-CM

## 2023-01-15 DIAGNOSIS — N183 Chronic kidney disease, stage 3 unspecified: Secondary | ICD-10-CM | POA: Diagnosis not present

## 2023-01-15 DIAGNOSIS — I129 Hypertensive chronic kidney disease with stage 1 through stage 4 chronic kidney disease, or unspecified chronic kidney disease: Secondary | ICD-10-CM | POA: Diagnosis present

## 2023-01-15 DIAGNOSIS — I2510 Atherosclerotic heart disease of native coronary artery without angina pectoris: Secondary | ICD-10-CM | POA: Diagnosis not present

## 2023-01-15 DIAGNOSIS — G219 Secondary parkinsonism, unspecified: Secondary | ICD-10-CM | POA: Diagnosis present

## 2023-01-15 DIAGNOSIS — Z8546 Personal history of malignant neoplasm of prostate: Secondary | ICD-10-CM

## 2023-01-15 DIAGNOSIS — R1084 Generalized abdominal pain: Secondary | ICD-10-CM | POA: Diagnosis not present

## 2023-01-15 DIAGNOSIS — Z7401 Bed confinement status: Secondary | ICD-10-CM | POA: Diagnosis not present

## 2023-01-15 DIAGNOSIS — Z434 Encounter for attention to other artificial openings of digestive tract: Secondary | ICD-10-CM | POA: Diagnosis not present

## 2023-01-15 DIAGNOSIS — F02811 Dementia in other diseases classified elsewhere, unspecified severity, with agitation: Secondary | ICD-10-CM | POA: Insufficient documentation

## 2023-01-15 DIAGNOSIS — R231 Pallor: Secondary | ICD-10-CM | POA: Diagnosis not present

## 2023-01-15 DIAGNOSIS — F03918 Unspecified dementia, unspecified severity, with other behavioral disturbance: Secondary | ICD-10-CM | POA: Diagnosis not present

## 2023-01-15 DIAGNOSIS — K810 Acute cholecystitis: Secondary | ICD-10-CM | POA: Diagnosis not present

## 2023-01-15 DIAGNOSIS — R278 Other lack of coordination: Secondary | ICD-10-CM | POA: Diagnosis not present

## 2023-01-15 DIAGNOSIS — Z888 Allergy status to other drugs, medicaments and biological substances status: Secondary | ICD-10-CM

## 2023-01-15 DIAGNOSIS — R109 Unspecified abdominal pain: Secondary | ICD-10-CM | POA: Diagnosis not present

## 2023-01-15 LAB — RESP PANEL BY RT-PCR (RSV, FLU A&B, COVID)  RVPGX2
Influenza A by PCR: NEGATIVE
Influenza B by PCR: NEGATIVE
Resp Syncytial Virus by PCR: NEGATIVE
SARS Coronavirus 2 by RT PCR: POSITIVE — AB

## 2023-01-15 LAB — COMPREHENSIVE METABOLIC PANEL
ALT: 207 U/L — ABNORMAL HIGH (ref 0–44)
AST: 98 U/L — ABNORMAL HIGH (ref 15–41)
Albumin: 3.5 g/dL (ref 3.5–5.0)
Alkaline Phosphatase: 124 U/L (ref 38–126)
Anion gap: 13 (ref 5–15)
BUN: 40 mg/dL — ABNORMAL HIGH (ref 8–23)
CO2: 22 mmol/L (ref 22–32)
Calcium: 9.1 mg/dL (ref 8.9–10.3)
Chloride: 110 mmol/L (ref 98–111)
Creatinine, Ser: 1.58 mg/dL — ABNORMAL HIGH (ref 0.61–1.24)
GFR, Estimated: 41 mL/min — ABNORMAL LOW (ref >60)
Glucose, Bld: 248 mg/dL — ABNORMAL HIGH (ref 70–99)
Potassium: 4.9 mmol/L (ref 3.5–5.1)
Sodium: 145 mmol/L (ref 135–145)
Total Bilirubin: 2 mg/dL — ABNORMAL HIGH (ref 0.3–1.2)
Total Protein: 6.8 g/dL (ref 6.5–8.1)

## 2023-01-15 LAB — CBC WITH DIFFERENTIAL/PLATELET
Abs Immature Granulocytes: 0.59 10*3/uL — ABNORMAL HIGH (ref 0.00–0.07)
Basophils Absolute: 0.1 10*3/uL (ref 0.0–0.1)
Basophils Relative: 0 %
Eosinophils Absolute: 0 10*3/uL (ref 0.0–0.5)
Eosinophils Relative: 0 %
HCT: 46.5 % (ref 39.0–52.0)
Hemoglobin: 15 g/dL (ref 13.0–17.0)
Immature Granulocytes: 1 %
Lymphocytes Relative: 1 %
Lymphs Abs: 0.3 10*3/uL — ABNORMAL LOW (ref 0.7–4.0)
MCH: 30 pg (ref 26.0–34.0)
MCHC: 32.3 g/dL (ref 30.0–36.0)
MCV: 93 fL (ref 80.0–100.0)
Monocytes Absolute: 1.3 10*3/uL — ABNORMAL HIGH (ref 0.1–1.0)
Monocytes Relative: 3 %
Neutro Abs: 38.8 10*3/uL — ABNORMAL HIGH (ref 1.7–7.7)
Neutrophils Relative %: 95 %
Platelets: 290 10*3/uL (ref 150–400)
RBC: 5 MIL/uL (ref 4.22–5.81)
RDW: 14.1 % (ref 11.5–15.5)
WBC: 41 10*3/uL — ABNORMAL HIGH (ref 4.0–10.5)
nRBC: 0 % (ref 0.0–0.2)

## 2023-01-15 LAB — URINALYSIS, ROUTINE W REFLEX MICROSCOPIC
Bacteria, UA: NONE SEEN
Bilirubin Urine: NEGATIVE
Glucose, UA: NEGATIVE mg/dL
Ketones, ur: 5 mg/dL — AB
Leukocytes,Ua: NEGATIVE
Nitrite: NEGATIVE
Protein, ur: 100 mg/dL — AB
RBC / HPF: >50 RBC/hpf (ref 0–5)
Specific Gravity, Urine: 1.035 — ABNORMAL HIGH (ref 1.005–1.030)
pH: 5 (ref 5.0–8.0)

## 2023-01-15 LAB — LACTIC ACID, PLASMA: Lactic Acid, Venous: 3.4 mmol/L (ref 0.5–1.9)

## 2023-01-15 LAB — APTT: aPTT: 26 seconds (ref 24–36)

## 2023-01-15 LAB — PROTIME-INR
INR: 1.1 (ref 0.8–1.2)
Prothrombin Time: 13.6 seconds (ref 11.4–15.2)

## 2023-01-15 LAB — LIPASE, BLOOD: Lipase: 24 U/L (ref 11–51)

## 2023-01-15 MED ORDER — IOHEXOL 300 MG/ML  SOLN
80.0000 mL | Freq: Once | INTRAMUSCULAR | Status: AC | PRN
  Administered 2023-01-15: 80 mL via INTRAVENOUS

## 2023-01-15 MED ORDER — LACTATED RINGERS IV BOLUS (SEPSIS)
1000.0000 mL | Freq: Once | INTRAVENOUS | Status: AC
  Administered 2023-01-15: 1000 mL via INTRAVENOUS

## 2023-01-15 MED ORDER — LACTATED RINGERS IV SOLN: INTRAVENOUS | Status: DC

## 2023-01-15 MED ORDER — SODIUM CHLORIDE 0.9 % IV SOLN
2.0000 g | Freq: Once | INTRAVENOUS | Status: AC
  Administered 2023-01-15: 2 g via INTRAVENOUS
  Filled 2023-01-15: qty 12.5

## 2023-01-15 MED ORDER — HYDROMORPHONE HCL 1 MG/ML IJ SOLN
0.2500 mg | Freq: Once | INTRAMUSCULAR | Status: AC
  Administered 2023-01-15: 0.25 mg via INTRAVENOUS
  Filled 2023-01-15: qty 1

## 2023-01-15 MED ORDER — METRONIDAZOLE 500 MG/100ML IV SOLN
500.0000 mg | Freq: Once | INTRAVENOUS | Status: AC
  Administered 2023-01-15: 500 mg via INTRAVENOUS
  Filled 2023-01-15: qty 100

## 2023-01-15 MED ORDER — ONDANSETRON HCL 4 MG/2ML IJ SOLN
4.0000 mg | Freq: Once | INTRAMUSCULAR | Status: AC
  Administered 2023-01-15: 4 mg via INTRAVENOUS
  Filled 2023-01-15: qty 2

## 2023-01-15 MED ORDER — SODIUM CHLORIDE 0.9 % IV SOLN: 2.0000 g | Freq: Two times a day (BID) | INTRAVENOUS | Status: DC

## 2023-01-15 NOTE — H&P (Signed)
History and Physical    Devin Vance [id number] DOB: 23-Sep-1928 DOA: 01/15/2023  PCP: Wenda Low, MD  Patient coming from: Independent living, [facility]  I have personally briefly reviewed patient's old medical records in [place]  Chief Complaint: Abdominal pain, nausea  HPI: Devin Vance is a 89 y.o. male with medical history significant for Alzheimer's dementia, CAD, CKD stage IIIb, COPD, rheumatoid arthritis, HTN, HLD, prostate cancer, OSA not using CPAP who presented to the ED for evaluation of abdominal pain with nausea.***  ***  ED Course  Labs/Imaging on admission: I have personally reviewed following labs and imaging studies.  Initial vitals showed BP 174/107, pulse 112, RR 43, temp 98.6 F, SpO2 95% on room air.  Labs show WBC 41.0, hemoglobin 15.0, platelets 290,000, sodium 145, potassium 4.9, bicarb 22, BUN 40, creatinine 1.58, serum glucose 248, AST 98, ALT 207, alk phos 124, total bilirubin 2.0, lipase 24, lactic acid 3.4.  SARS-CoV-2 PCR persistently positive.  Influenza and RSV negative.  Urinalysis negative for UTI.  Blood cultures in process.  Portable chest x-ray shows low lung volumes without focal consolidation, edema, effusion.  CT abdomen/pelvis with contrast shows cholelithiasis with acute cholecystitis.  No bowel obstruction.  Normal-appearing appendix.  Patient was given 2 L LR, IV cefepime and Flagyl.  EDP discussed with on-call general surgery, Dr. Redmond Pulling.  Patient felt to be high risk for cholecystectomy.  Recommended medical admission, general surgery will see in a.m. and anticipate IR cholecystostomy tube versus operative intervention.  The hospitalist service was consulted to admit for further evaluation and management.  Review of Systems: All systems reviewed and are negative except as documented in history of present illness above.   Past Medical History:  Diagnosis Date   Alzheimer disease 07/31/2018   Angina pectoris     Arthritis    Cardiomyopathy    COPD (chronic obstructive pulmonary disease)    Gastroesophageal reflux disease    History of colon polyps    Hyperlipidemia    Hypertension    Obesity    Osteoporosis    Polymyalgia rheumatica    Prostate cancer    Rheumatoid arthritis    Secondary Parkinson disease 08/13/2019   Sleep apnea     Past Surgical History:  Procedure Laterality Date   CATARACT EXTRACTION     CORONARY ANGIOPLASTY     HEMORRHOIDECTOMY WITH HEMORRHOID BANDING     PROSTATECTOMY     STRABISMUS SURGERY     TONSILLECTOMY      Social History:  reports that he has never smoked. He has never used smokeless tobacco. He reports that he does not currently use alcohol. He reports that he does not currently use drugs.  Allergies  Allergen Reactions   Lorazepam Other (See Comments)    Generalized weakness    Family History  Problem Relation Age of Onset   Stroke Mother    Dementia Mother    Cancer Father    Dementia Sister    Dementia Maternal Aunt      Prior to Admission medications   Medication Sig Start Date End Date Taking? Authorizing Provider  aspirin EC 81 MG tablet Take 81 mg by mouth daily.    [provider]  Cholecalciferol (VITAMIN D-3 PO) Take 1,000 Units by mouth daily.    [provider]  Cyanocobalamin (VITAMIN B12) 1000 MCG TBCR Take 2,500 mcg by mouth daily.    [provider]  Fluticasone Propionate (FLONASE ALLERGY RELIEF NA) Place 2  sprays into both nostrils daily as needed (Rhinitis).    [provider]  guaiFENesin (MUCINEX) 600 MG 12 hr tablet Take 1 tablet (600 mg total) by mouth 2 (two) times daily. 01/05/23   Regalado, Belkys A, MD  hydroxychloroquine (PLAQUENIL) 200 MG tablet Take 200 mg by mouth See admin instructions. Take 200 mg (1 tablet) every other day, alternating with 400 mg (2 tablets) every other day. 06/11/19   [provider]  ipratropium-albuterol (DUONEB) 0.5-2.5 (3) MG/3ML SOLN Take 3  mLs by nebulization 2 (two) times daily. 01/05/23   Regalado, Belkys A, MD  ketoconazole (NIZORAL) 2 % shampoo Apply 1 Application topically as needed for irritation. 02/13/19   [provider]  Lactobacillus (PROBIOTIC ACIDOPHILUS PO) Take 1 tablet by mouth daily.    [provider]  levothyroxine (SYNTHROID) 25 MCG tablet Take 25 mcg by mouth daily before breakfast. 04/29/20   [provider]  memantine (NAMENDA) 10 MG tablet Take 1 tablet (10 mg total) by mouth 2 (two) times daily. 05/31/22   [name], MD  metoprolol succinate (TOPROL-XL) 25 MG 24 hr tablet Take 12.5 mg by mouth daily. 08/20/16 01/02/23  [provider]  modafinil (PROVIGIL) 100 MG tablet Take 1 tablet (100 mg total) by mouth daily. 12/05/22   [name], MD  Multiple Vitamin (MULTIVITAMIN) capsule Take 1 capsule by mouth daily.    [provider]  Omega-3 Fatty Acids (FISH OIL) 1200 MG CAPS Take 1,200 mg by mouth daily.    [provider]  pantoprazole (PROTONIX) 40 MG tablet Take 1 tablet (40 mg total) by mouth daily. 01/05/23   Regalado, Belkys A, MD  QUEtiapine (SEROQUEL) 25 MG tablet Take 1 tablet (25 mg total) by mouth at bedtime. 05/31/22   [name], MD    Physical Exam: Vitals:   01/15/23 2115 01/15/23 2125 01/15/23 2200 01/15/23 2315  BP: (!) 176/101  (!) 169/97 (!) 179/84  Pulse: (!) 113  (!) 114 (!) 106  Resp: (!) 21  (!) 29 (!) 33  Temp:  98.6 F (37 C)    TempSrc:  Oral    SpO2: 97%  95% 100%  Weight:      Height:       *** Constitutional: NAD, calm, comfortable Eyes: PERRL, lids and conjunctivae normal ENMT: Mucous membranes are moist. Posterior pharynx clear of any exudate or lesions.Normal dentition.  Neck: normal, supple, no masses. Respiratory: clear to auscultation bilaterally, no wheezing, no crackles. Normal respiratory effort. No accessory muscle use.  Cardiovascular: Regular rate and rhythm, no murmurs / rubs / gallops. No  extremity edema. 2+ pedal pulses. Abdomen: no tenderness, no masses palpated. No hepatosplenomegaly. Bowel sounds positive.  Musculoskeletal: no clubbing / cyanosis. No joint deformity upper and lower extremities. Good ROM, no contractures. Normal muscle tone.  Skin: no rashes, lesions, ulcers. No induration Neurologic: CN 2-12 grossly intact. Sensation intact. Strength 5/5 in all 4.  Psychiatric: Normal judgment and insight. Alert and oriented x 3. Normal mood.   EKG: Personally reviewed. ***  Assessment/Plan Principal Problem:   Severe sepsis with lactic acidosis   *** No notes on file *** Assessment and Plan: No notes have been filed under this hospital service. Service: Hospitalist  Severe sepsis due to acute calculus cholecystitis: ***  COVID-19 positive: ***  Coronary artery disease: ***  Hypertension: ***  Alzheimer's dementia with agitation: ***  Hypothyroidism: ***  Rheumatoid arthritis: ***  OSA: ***   DVT prophylaxis: ***  Code Status: ***  Family Communication: ***  Disposition Plan: ***  Consults called: General surgery Severity of Illness: The appropriate patient status for this patient is INPATIENT. Inpatient status is judged to be reasonable and necessary in order to provide the required intensity of service to ensure the patient's safety. The patient's presenting symptoms, physical exam findings, and initial radiographic and laboratory data in the context of their chronic comorbidities is felt to place them at high risk for further clinical deterioration. Furthermore, it is not anticipated that the patient will be medically stable for discharge from the hospital within 2 midnights of admission.   * I certify that at the point of admission it is my clinical judgment that the patient will require inpatient hospital care spanning beyond 2 midnights from the point of admission due to high intensity of service, high risk for further deterioration and  high frequency of surveillance required.Zada Finders MD [facility]  If 7PM-7AM, please contact night-coverage [email or web address]  01/15/2023, 11:38 PM

## 2023-01-15 NOTE — ED Provider Notes (Signed)
[place] [facility] Provider Note   CSN: [id number] Arrival date & time: 01/15/23  2055     History  Chief Complaint  Patient presents with  . Abdominal Pain  . Fever    Devin Vance is a 89 y.o. male with HTN, GERD, Alzheimer's dementia, COPD, CKD stage III, AAA, RA, history of prostate cancer who presents with abdominal pain, fever.  Patient presents with wife and daughter who provide history.  Patient arrives by EMS from [facility] independent living.  He began to have nausea over the course of the day as well as a fever at home.  He does not communicate very much due to his baseline Alzheimer's dementia but has indicated that he may have abdominal pain.  They state that he was recently admitted to the hospital [place] and had a CT and [place] of his gallbladder at that time that also indicated signs of cholecystitis.  Per chart review he was managed conservatively and his symptoms improved, transaminitis improved and so he was discharged without intervention.   Abdominal Pain Associated symptoms: fever   Fever      Home Medications Prior to Admission medications   Medication Sig Start Date End Date Taking? Authorizing Provider  aspirin EC 81 MG tablet Take 81 mg by mouth daily.    [provider]  Cholecalciferol (VITAMIN D-3 PO) Take 1,000 Units by mouth daily.    [provider]  Cyanocobalamin (VITAMIN B12) 1000 MCG TBCR Take 2,500 mcg by mouth daily.    [provider]  Fluticasone Propionate (FLONASE ALLERGY RELIEF NA) Place 2 sprays into both nostrils daily as needed (Rhinitis).    [provider]  guaiFENesin (MUCINEX) 600 MG 12 hr tablet Take 1 tablet (600 mg total) by mouth 2 (two) times daily. 01/05/23   Regalado, Belkys A, MD  hydroxychloroquine (PLAQUENIL) 200 MG tablet Take 200 mg by mouth See admin instructions. Take 200 mg (1 tablet) every other day, alternating with 400 mg (2 tablets)  every other day. 06/11/19   [provider]  ipratropium-albuterol (DUONEB) 0.5-2.5 (3) MG/3ML SOLN Take 3 mLs by nebulization 2 (two) times daily. 01/05/23   Regalado, Belkys A, MD  ketoconazole (NIZORAL) 2 % shampoo Apply 1 Application topically as needed for irritation. 02/13/19   [provider]  Lactobacillus (PROBIOTIC ACIDOPHILUS PO) Take 1 tablet by mouth daily.    [provider]  levothyroxine (SYNTHROID) 25 MCG tablet Take 25 mcg by mouth daily before breakfast. 04/29/20   [provider]  memantine (NAMENDA) 10 MG tablet Take 1 tablet (10 mg total) by mouth 2 (two) times daily. 05/31/22   [name], MD  metoprolol succinate (TOPROL-XL) 25 MG 24 hr tablet Take 12.5 mg by mouth daily. 08/20/16 01/02/23  [provider]  modafinil (PROVIGIL) 100 MG tablet Take 1 tablet (100 mg total) by mouth daily. 12/05/22   [name], MD  Multiple Vitamin (MULTIVITAMIN) capsule Take 1 capsule by mouth daily.    [provider]  Omega-3 Fatty Acids (FISH OIL) 1200 MG CAPS Take 1,200 mg by mouth daily.    [provider]  pantoprazole (PROTONIX) 40 MG tablet Take 1 tablet (40 mg total) by mouth daily. 01/05/23   Regalado, Belkys A, MD  QUEtiapine (SEROQUEL) 25 MG tablet Take 1 tablet (25 mg total) by mouth at bedtime. 05/31/22   [name], MD      Allergies    Lorazepam  Review of Systems   Review of Systems  Constitutional:  Positive for fever.  Gastrointestinal:  Positive for abdominal pain.   Review of systems obtained from family and  Positive for f/c.  A 10 point review of systems was performed and is negative unless otherwise reported in HPI.  Physical Exam Updated Vital Signs BP (!) 179/84   Pulse (!) 106   Temp 98.6 F (37 C) (Oral)   Resp (!) 33   Ht 5\' 10"  (1.778 m)   Wt 90.7 kg   SpO2 100%   BMI 28.70 kg/m  Physical Exam General: Normal appearing male, lying in bed.  HEENT: PERRLA, Sclera anicteric,  dry mucous membranes, trachea midline.  Cardiology: Regular tachycardic rate, no murmurs/rubs/gallops. BL radial and DP pulses equal bilaterally.  Resp: Mildly tachypneic, normal effort. CTAB, no wheezes, rhonchi, crackles.  Abd: Soft. Groans with palpation of the RUQ/epigastric region. Mildly distended. No rebound tenderness or guarding.  GU: Deferred. MSK: No peripheral edema or signs of trauma. Extremities without deformity or TTP. No cyanosis or clubbing. Skin: warm, dry.  Neuro: Sleepy, does not respond to questions except to say "no." CNs II-XII grossly intact. MAEs. Sensation grossly intact.   ED Results / Procedures / Treatments   Labs (all labs ordered are listed, but only abnormal results are displayed) Labs Reviewed  RESP PANEL BY RT-PCR (RSV, FLU A&B, COVID)  RVPGX2 - Abnormal; Notable for the following components:      Result Value   SARS Coronavirus 2 by RT PCR POSITIVE (*)    All other components within normal limits  CBC WITH DIFFERENTIAL/PLATELET - Abnormal; Notable for the following components:   WBC 41.0 (*)    Neutro Abs 38.8 (*)    Lymphs Abs 0.3 (*)    Monocytes Absolute 1.3 (*)    Abs Immature Granulocytes 0.59 (*)    All other components within normal limits  COMPREHENSIVE METABOLIC PANEL - Abnormal; Notable for the following components:   Glucose, Bld 248 (*)    BUN 40 (*)    Creatinine, Ser 1.58 (*)    AST 98 (*)    ALT 207 (*)    Total Bilirubin 2.0 (*)    GFR, Estimated 41 (*)    All other components within normal limits  URINALYSIS, ROUTINE W REFLEX MICROSCOPIC - Abnormal; Notable for the following components:   Color, Urine AMBER (*)    Specific Gravity, Urine 1.035 (*)    Hgb urine dipstick MODERATE (*)    Ketones, ur 5 (*)    Protein, ur 100 (*)    All other components within normal limits  LACTIC ACID, PLASMA - Abnormal; Notable for the following components:   Lactic Acid, Venous 3.4 (*)    All other components within normal limits  CULTURE,  BLOOD (ROUTINE X 2)  CULTURE, BLOOD (ROUTINE X 2)  LIPASE, BLOOD  PROTIME-INR  APTT  LACTIC ACID, PLASMA    EKG EKG Interpretation  Date/Time:  Tuesday January 15 2023 21:46:17 EDT Ventricular Rate:  115 PR Interval:  165 QRS Duration: 107 QT Interval:  332 QTC Calculation: 460 R Axis:   -27 Text Interpretation: Sinus tachycardia Ventricular premature complex Confirmed by Cindee Lame [phone]) on 01/15/2023 10:19:53 PM  Radiology CT Abdomen Pelvis W Contrast  Result Date: 01/15/2023 CLINICAL DATA:  Abdominal pain. EXAM: CT ABDOMEN AND PELVIS WITH CONTRAST TECHNIQUE: Multidetector CT imaging of the abdomen and pelvis was performed using the standard protocol following bolus administration of intravenous contrast. RADIATION  DOSE REDUCTION: This exam was performed according to the departmental dose-optimization program which includes automated exposure control, adjustment of the mA and/or kV according to patient size and/or use of iterative reconstruction technique. CONTRAST:  62mL OMNIPAQUE IOHEXOL 300 MG/ML  SOLN COMPARISON:  CT abdomen pelvis dated 01/02/2023. FINDINGS: Lower chest: There are bibasilar subpleural atelectasis. There is coronary vascular calcification. No intra-abdominal free air. Upper abdominal and perihepatic ascites. Hepatobiliary: The liver is unremarkable. There is mild biliary dilatation versus periportal edema. The gallbladder is distended. Multiple stones noted within the gallbladder. There is a impacted appearing stone in the neck of the gallbladder. There is extensive inflammatory changes and edema surrounding the gallbladder. Findings most consistent with acute cholecystitis. Pancreas: There is mild inflammatory changes and stranding adjacent to the pancreas, likely related to inflammatory changes of the gallbladder. Acute pancreatitis is less likely. Correlation with pancreatic enzymes recommended. Spleen: Normal in size without focal abnormality. Adrenals/Urinary Tract:  The adrenal glands are unremarkable. There is no hydronephrosis on either side. There is symmetric enhancement and excretion of contrast by both kidneys. The visualized ureters and urinary bladder appear unremarkable. Stomach/Bowel: There is no bowel obstruction or active inflammation. The appendix is normal. Vascular/Lymphatic: Moderate aortoiliac atherosclerotic disease. The IVC is unremarkable. No portal venous gas. There is no adenopathy. Reproductive: Prostatectomy. Other: Midline vertical anterior pelvic wall incisional scar. Musculoskeletal: Degenerative changes of the spine. No acute osseous pathology. IMPRESSION: 1. Cholelithiasis with findings of acute cholecystitis. 2. No bowel obstruction. Normal appendix. 3.  Aortic Atherosclerosis (ICD10-I70.0). Electronically Signed   By: Anner Crete M.D.   On: 01/15/2023 22:49   DG Chest Port 1 View  Result Date: 01/15/2023 CLINICAL DATA:  Questionable sepsis-evaluate for abnormality. 4 hours of upper abdominal pain and nausea. EXAM: PORTABLE CHEST 1 VIEW COMPARISON:  Radiographs 01/02/2023 FINDINGS: Stable cardiomediastinal silhouette given patient rotation. Aortic atherosclerotic calcification. Low lung volumes accentuate pulmonary vascularity. Bibasilar/perihilar atelectasis, decreased from 01/02/2023. Gaseous distention of the stomach. No displaced rib fractures. IMPRESSION: Low lung volumes.  No active disease. Electronically Signed   By: Placido Sou M.D.   On: 01/15/2023 21:55    Procedures .Critical Care  Performed by: Audley Hose, MD Authorized by: Audley Hose, MD   Critical care provider statement:    Critical care time (minutes):  40   Critical care was necessary to treat or prevent imminent or life-threatening deterioration of the following conditions:  Sepsis   Critical care was time spent personally by me on the following activities:  Development of treatment plan with patient or surrogate, discussions with consultants,  evaluation of patient's response to treatment, examination of patient, ordering and review of laboratory studies, ordering and review of radiographic studies, ordering and performing treatments and interventions, pulse oximetry, re-evaluation of patient's condition, review of old charts and obtaining history from patient or surrogate   Care discussed with: admitting provider       Medications Ordered in ED Medications  lactated ringers infusion (has no administration in time range)  metroNIDAZOLE (FLAGYL) IVPB 500 mg (500 mg Intravenous New Bag/Given 01/15/23 2309)  ondansetron (ZOFRAN) injection 4 mg (has no administration in time range)  HYDROmorphone (DILAUDID) injection 0.25 mg (has no administration in time range)  lactated ringers bolus 1,000 mL (1,000 mLs Intravenous New Bag/Given 01/15/23 2205)    And  lactated ringers bolus 1,000 mL (1,000 mLs Intravenous New Bag/Given 01/15/23 2210)  ceFEPIme (MAXIPIME) 2 g in sodium chloride 0.9 % 100 mL IVPB (0 g Intravenous Stopped  01/15/23 2309)  iohexol (OMNIPAQUE) 300 MG/ML solution 80 mL (80 mLs Intravenous Contrast Given 01/15/23 2225)    ED Course/ Medical Decision Making/ A&P                          Medical Decision Making Amount and/or Complexity of Data Reviewed Labs: ordered. Decision-making details documented in ED Course. Radiology: ordered. Decision-making details documented in ED Course. ECG/medicine tests: ordered.  Risk Prescription drug management.    This patient presents to the ED for concern of abdominal pain/fever, this involves an extensive number of treatment options, and is a complaint that carries with it a high risk of complications and morbidity.  I considered the following differential and admission for this acute, potentially life threatening condition.   MDM:    For DDX for abdominal pain includes but is not limited to:  Abdominal exam without peritoneal signs. No evidence of acute abdomen at this time.    Patient was recently mid to the hospital hide signs of acute cholecystitis managed conservatively.  Now with right upper quadrant epigastric tenderness palpation by patient groaning as well as signs of sepsis on vitals, great concern for acute cholecystitis with sepsis.  Consider also cholangitis, choledocholithiasis, acute pancreatitis, SBO, or other intra-abdominal infection such as diverticulitis or appendicitis the patient's pain appears to be in his upper abdomen.  Initiated sepsis protocol with fluid bolus as well as broad-spectrum intra-abdominal antibiotics will obtain labs and CT abdomen pelvis. Blood cultures drawn. Given analgesia/antiemetics.  Clinical Course as of 01/15/23 2327  Tue Jan 15, 2023  2219 Lactic Acid, Venous(!!): 3.4 Receiving fluids [HN]  2219 WBC(!): 41.0 Large leukocytosis [HN]  2219 Glucose(!): 248 [HN]  2219 Creatinine(!): 1.58 Mild elevation [HN]  2219 Lipase: 24 [HN]  2219 DG Chest Port 1 View Low lung volumes. No active disease [HN]  2252 Comprehensive metabolic panel(!) Uptrending AST/ALT, Tbili [HN]  2253 CT Abdomen Pelvis W Contrast 1. Cholelithiasis with findings of acute cholecystitis. 2. No bowel obstruction. Normal appendix. 3.  Aortic Atherosclerosis (ICD10-I70.0).   [HN]  2253 Consulted to general surgery [HN]  2308 D/w surgery who states he will likely be managed w/ abx and perc chole tube. Will be admitted to medicine. [HN]  2327 SARS Coronavirus 2 by RT PCR(!): POSITIVE [HN]    Clinical Course User Index [HN] Audley Hose, MD    Labs: I Ordered, and personally interpreted labs.  The pertinent results include: Those listed above  Imaging Studies ordered: I ordered imaging studies including CT abdomen pelvis I independently visualized and interpreted imaging. I agree with the radiologist interpretation  Additional history obtained from chart review, wife, daughter.    Cardiac Monitoring: .The patient was maintained on a  cardiac monitor.  I personally viewed and interpreted the cardiac monitored which showed an underlying rhythm of: Sinus tachycardia  Reevaluation: After the interventions noted above, I reevaluated the patient and found that they have : Stayed the same  Social Determinants of Health: . patient lives in independent living facility  Disposition: Admit  Co morbidities that complicate the patient evaluation . Past Medical History:  Diagnosis Date  . Alzheimer disease 07/31/2018  . Angina pectoris   . Arthritis   . Cardiomyopathy   . COPD (chronic obstructive pulmonary disease)   . Gastroesophageal reflux disease   . History of colon polyps   . Hyperlipidemia   . Hypertension   . Obesity   . Osteoporosis   .  Polymyalgia rheumatica   . Prostate cancer   . Rheumatoid arthritis   . Secondary Parkinson disease 08/13/2019  . Sleep apnea      Medicines Meds ordered this encounter  Medications  . lactated ringers infusion  . AND Linked Order Group   . lactated ringers bolus 1,000 mL     Order Specific Question:   Total Body Weight basis for 30 mL/kg  bolus delivery     Answer:   90.7 kg   . lactated ringers bolus 1,000 mL     Order Specific Question:   Total Body Weight basis for 30 mL/kg  bolus delivery     Answer:   90.7 kg  . ceFEPIme (MAXIPIME) 2 g in sodium chloride 0.9 % 100 mL IVPB    Order Specific Question:   Antibiotic Indication:    Answer:   Other Indication (list below)    Order Specific Question:   Other Indication:    Answer:   Intra-abdominal  . metroNIDAZOLE (FLAGYL) IVPB 500 mg    Order Specific Question:   Antibiotic Indication:    Answer:   Intra-abdominal Infection  . iohexol (OMNIPAQUE) 300 MG/ML solution 80 mL  . ondansetron (ZOFRAN) injection 4 mg  . HYDROmorphone (DILAUDID) injection 0.25 mg    I have reviewed the patients home medicines and have made adjustments as needed  Problem List / ED Course: Problem List Items Addressed This Visit    None Visit Diagnoses     Acute calculous cholecystitis    -  Primary   Sepsis, due to unspecified organism, unspecified whether acute organ dysfunction present                       This note was created using dictation software, which may contain spelling or grammatical errors.    Audley Hose, MD 01/15/23 2325

## 2023-01-15 NOTE — ED Triage Notes (Addendum)
Arrives EMS from [facility].   ~4 hours up upper abdominal pain and nausea. Denies vomiting. Paramedics report fever 100.6 with pts home  thermometer.   Recently dx and admit for covid on 3/20. Had CT / [place] of gallbladder completed then also.

## 2023-01-15 NOTE — Sepsis Progress Note (Signed)
Sepsis protocol is being followed by [facility]. 

## 2023-01-15 NOTE — Progress Notes (Addendum)
Consult received by EDP  CCS will see pt in am  Chart and imaging reviewed  Prelimin findings/recs Acute calculous cholecystitis sepsis Recent Covid 19 infection 3/20 HTN (hypertension)   GERD (gastroesophageal reflux disease)   CKD (chronic kidney disease) stage 3, GFR 30-59 ml/min (HCC)   COPD (chronic obstructive pulmonary disease) (HCC)   Dementia with behavioral disturbance (HCC)   HLD (hyperlipidemia)   Rheumatoid arthritis involving multiple sites [facility])   AAA (abdominal aortic aneurysm) (HCC)   Dilation of stomach   Chronic diarrhea Question radiological abnormal stomach on CT renal study   Given atypical findings of GB going back to 3/20 and how appears on tonight's CT scan and recent Covid 19 infection, would not recommend cholecystectomy at this time - high risk for complications.   Abx for high risk cholecystitis - zosyn Day team will likely recommend IR cholecystostomy tube as opposed to operative intervention - Dr Kae Heller to make final determination.   NPO xcept meds/ice chips after MN  Leighton Ruff. Redmond Pulling, MD, FACS General, Bariatric, & Minimally Invasive Surgery [facility],  [place]

## 2023-01-16 ENCOUNTER — Inpatient Hospital Stay (HOSPITAL_COMMUNITY): Payer: Medicare Other

## 2023-01-16 ENCOUNTER — Other Ambulatory Visit (HOSPITAL_COMMUNITY): Payer: Medicare Other

## 2023-01-16 DIAGNOSIS — U071 COVID-19: Secondary | ICD-10-CM

## 2023-01-16 DIAGNOSIS — N1832 Chronic kidney disease, stage 3b: Secondary | ICD-10-CM

## 2023-01-16 DIAGNOSIS — I2510 Atherosclerotic heart disease of native coronary artery without angina pectoris: Secondary | ICD-10-CM

## 2023-01-16 DIAGNOSIS — G301 Alzheimer's disease with late onset: Secondary | ICD-10-CM

## 2023-01-16 DIAGNOSIS — E039 Hypothyroidism, unspecified: Secondary | ICD-10-CM

## 2023-01-16 DIAGNOSIS — A419 Sepsis, unspecified organism: Secondary | ICD-10-CM | POA: Diagnosis not present

## 2023-01-16 DIAGNOSIS — K8000 Calculus of gallbladder with acute cholecystitis without obstruction: Secondary | ICD-10-CM | POA: Diagnosis not present

## 2023-01-16 DIAGNOSIS — R739 Hyperglycemia, unspecified: Secondary | ICD-10-CM

## 2023-01-16 DIAGNOSIS — F02C11 Dementia in other diseases classified elsewhere, severe, with agitation: Secondary | ICD-10-CM

## 2023-01-16 HISTORY — PX: IR PERC CHOLECYSTOSTOMY: IMG2326

## 2023-01-16 LAB — GLUCOSE, CAPILLARY
Glucose-Capillary: 175 mg/dL — ABNORMAL HIGH (ref 70–99)
Glucose-Capillary: 148 mg/dL — ABNORMAL HIGH (ref 70–99)
Glucose-Capillary: 167 mg/dL — ABNORMAL HIGH (ref 70–99)
Glucose-Capillary: 155 mg/dL — ABNORMAL HIGH (ref 70–99)
Glucose-Capillary: 157 mg/dL — ABNORMAL HIGH (ref 70–99)
Glucose-Capillary: 111 mg/dL — ABNORMAL HIGH (ref 70–99)

## 2023-01-16 LAB — COMPREHENSIVE METABOLIC PANEL
ALT: 171 U/L — ABNORMAL HIGH (ref 0–44)
AST: 68 U/L — ABNORMAL HIGH (ref 15–41)
Albumin: 2.9 g/dL — ABNORMAL LOW (ref 3.5–5.0)
Alkaline Phosphatase: 103 U/L (ref 38–126)
Anion gap: 7 (ref 5–15)
BUN: 35 mg/dL — ABNORMAL HIGH (ref 8–23)
CO2: 27 mmol/L (ref 22–32)
Calcium: 8.5 mg/dL — ABNORMAL LOW (ref 8.9–10.3)
Chloride: 109 mmol/L (ref 98–111)
Creatinine, Ser: 1.43 mg/dL — ABNORMAL HIGH (ref 0.61–1.24)
GFR, Estimated: 46 mL/min — ABNORMAL LOW (ref >60)
Glucose, Bld: 157 mg/dL — ABNORMAL HIGH (ref 70–99)
Potassium: 4.6 mmol/L (ref 3.5–5.1)
Sodium: 143 mmol/L (ref 135–145)
Total Bilirubin: 1.7 mg/dL — ABNORMAL HIGH (ref 0.3–1.2)
Total Protein: 5.9 g/dL — ABNORMAL LOW (ref 6.5–8.1)

## 2023-01-16 LAB — PROCALCITONIN: Procalcitonin: 1.49 ng/mL

## 2023-01-16 LAB — CBC
HCT: 43.8 % (ref 39.0–52.0)
Hemoglobin: 13.8 g/dL (ref 13.0–17.0)
MCH: 29.2 pg (ref 26.0–34.0)
MCHC: 31.5 g/dL (ref 30.0–36.0)
MCV: 92.8 fL (ref 80.0–100.0)
Platelets: 210 10*3/uL (ref 150–400)
RBC: 4.72 MIL/uL (ref 4.22–5.81)
RDW: 14 % (ref 11.5–15.5)
WBC: 37.3 10*3/uL — ABNORMAL HIGH (ref 4.0–10.5)
nRBC: 0 % (ref 0.0–0.2)

## 2023-01-16 LAB — LACTIC ACID, PLASMA
Lactic Acid, Venous: 2 mmol/L (ref 0.5–1.9)
Lactic Acid, Venous: 4.8 mmol/L (ref 0.5–1.9)

## 2023-01-16 MED ORDER — IOHEXOL 300 MG/ML  SOLN
50.0000 mL | Freq: Once | INTRAMUSCULAR | Status: AC | PRN
  Administered 2023-01-16: 10 mL

## 2023-01-16 MED ORDER — LIDOCAINE-EPINEPHRINE 1 %-1:100000 IJ SOLN
20.0000 mL | Freq: Once | INTRAMUSCULAR | Status: AC
  Administered 2023-01-16: 10 mL

## 2023-01-16 MED ORDER — PIPERACILLIN-TAZOBACTAM 3.375 G IVPB
3.3750 g | Freq: Three times a day (TID) | INTRAVENOUS | Status: DC
  Administered 2023-01-16 – 2023-01-25 (×29): 3.375 g via INTRAVENOUS
  Filled 2023-01-16 (×29): qty 50

## 2023-01-16 MED ORDER — MORPHINE SULFATE (PF) 2 MG/ML IV SOLN
2.0000 mg | INTRAVENOUS | Status: DC | PRN
  Administered 2023-01-16 – 2023-01-17 (×3): 2 mg via INTRAVENOUS
  Filled 2023-01-16 (×3): qty 1

## 2023-01-16 MED ORDER — FENTANYL CITRATE (PF) 100 MCG/2ML IJ SOLN
INTRAMUSCULAR | Status: AC
  Filled 2023-01-16: qty 2

## 2023-01-16 MED ORDER — LACTATED RINGERS IV SOLN: INTRAVENOUS | Status: AC

## 2023-01-16 MED ORDER — FENTANYL CITRATE (PF) 100 MCG/2ML IJ SOLN
INTRAMUSCULAR | Status: AC | PRN
  Administered 2023-01-16 (×2): 25 ug via INTRAVENOUS

## 2023-01-16 MED ORDER — MIDAZOLAM HCL 2 MG/2ML IJ SOLN
INTRAMUSCULAR | Status: AC
  Filled 2023-01-16: qty 2

## 2023-01-16 MED ORDER — LACTATED RINGERS IV BOLUS
700.0000 mL | Freq: Once | INTRAVENOUS | Status: AC
  Administered 2023-01-16: 700 mL via INTRAVENOUS

## 2023-01-16 MED ORDER — SODIUM CHLORIDE 0.9% FLUSH
5.0000 mL | Freq: Three times a day (TID) | INTRAVENOUS | Status: DC
  Administered 2023-01-16 – 2023-01-29 (×36): 5 mL

## 2023-01-16 MED ORDER — ACETAMINOPHEN 325 MG PO TABS
650.0000 mg | ORAL_TABLET | Freq: Four times a day (QID) | ORAL | Status: DC | PRN
  Administered 2023-01-22: 650 mg via ORAL
  Filled 2023-01-16 (×2): qty 2

## 2023-01-16 MED ORDER — ONDANSETRON HCL 4 MG/2ML IJ SOLN: 4.0000 mg | Freq: Four times a day (QID) | INTRAMUSCULAR | Status: DC | PRN

## 2023-01-16 MED ORDER — INSULIN ASPART 100 UNIT/ML IJ SOLN
0.0000 [IU] | INTRAMUSCULAR | Status: DC
  Administered 2023-01-16 – 2023-01-24 (×7): 1 [IU] via SUBCUTANEOUS
  Filled 2023-01-16: qty 0.06

## 2023-01-16 MED ORDER — ONDANSETRON HCL 4 MG PO TABS: 4.0000 mg | ORAL_TABLET | Freq: Four times a day (QID) | ORAL | Status: DC | PRN

## 2023-01-16 MED ORDER — LIDOCAINE-EPINEPHRINE 1 %-1:100000 IJ SOLN
INTRAMUSCULAR | Status: AC
  Filled 2023-01-16: qty 1

## 2023-01-16 MED ORDER — SODIUM CHLORIDE 0.9% FLUSH
3.0000 mL | Freq: Two times a day (BID) | INTRAVENOUS | Status: DC
  Administered 2023-01-16 – 2023-01-28 (×18): 3 mL via INTRAVENOUS

## 2023-01-16 MED ORDER — ACETAMINOPHEN 650 MG RE SUPP: 650.0000 mg | Freq: Four times a day (QID) | RECTAL | Status: DC | PRN

## 2023-01-16 MED ORDER — MIDAZOLAM HCL 2 MG/2ML IJ SOLN
INTRAMUSCULAR | Status: AC | PRN
  Administered 2023-01-16 (×2): .5 mg via INTRAVENOUS

## 2023-01-16 MED ORDER — HYDROMORPHONE HCL 1 MG/ML IJ SOLN
0.5000 mg | INTRAMUSCULAR | Status: DC | PRN
  Administered 2023-01-16: 0.5 mg via INTRAVENOUS
  Filled 2023-01-16: qty 0.5

## 2023-01-16 NOTE — Progress Notes (Signed)
PHARMACY NOTE -  [place] has been assisting with dosing of Zosyn for intra-abdominal infection. Dosage remains stable at 3.375 g IV q8 hr and further renal adjustments per institutional Pharmacy antibiotic protocol  Pharmacy will sign off, following peripherally for culture results, dose adjustments, and length of therapy. Please reconsult if a change in clinical status warrants re-evaluation of dosage.  Reuel Boom, PharmD, BCPS [phone] 01/16/2023, 8:55 AM

## 2023-01-16 NOTE — Consult Note (Signed)
Chief Complaint: Patient was seen in consultation today for percutaneous cholecystostomy Chief Complaint  Patient presents with   Abdominal Pain   Fever    Referring Physician(s): Connor,C  Supervising Physician: Arne Cleveland  Patient Status: [facility] - In-pt  History of Present Illness: Devin Vance is a 89 y.o. male with past medical history of Alzheimer's disease, angina, cardiomyopathy, COPD, GERD, colon polyps, hyperlipidemia, hypertension, osteoporosis, polymyalgia rheumatica, prostate cancer, rheumatoid arthritis, coronary artery disease, chronic kidney disease, obstructive sleep apnea not on CPAP as well as recent COVID last month with persistent positivity.  He was admitted to [facility] ED yesterday with abdominal pain and nausea. He was recently admitted 01/02/2023-01/05/2023 with COVID-19 infection.  He was treated with Paxlovid and oral steroids.  He was also noted to have gallbladder dilation with suspected stone, wall thickening, and pericholecystic edema on CT imaging.  Ultrasound was of poor quality.  He did not have any abdominal pain, nausea, vomiting and tolerated diet at that time but now more symptomatic . He had a reported fever at home up to 100.6 F.  He did appear ill to family therefore he was brought to the ED for further evaluation. CT A/P yesterday revealed:  1. Cholelithiasis with findings of acute cholecystitis. 2. No bowel obstruction. Normal appendix. 3.  Aortic Atherosclerosis    Current temp 99.5, latest  labs include WBC 37.3, hgb nl, plts nl, creat 1.43, t bili 1.7, PT/INR nl, blood cx neg to date, LA 2.0; he is on IV Zosyn  Pt seen by CCS and deemed poor operative candidate; request now received for percutaneous cholecystostomy  Past Medical History:  Diagnosis Date   Alzheimer disease 07/31/2018   Angina pectoris    Arthritis    Cardiomyopathy    COPD (chronic obstructive pulmonary disease)    Gastroesophageal reflux disease     History of colon polyps    Hyperlipidemia    Hypertension    Obesity    Osteoporosis    Polymyalgia rheumatica    Prostate cancer    Rheumatoid arthritis    Secondary Parkinson disease 08/13/2019   Sleep apnea     Past Surgical History:  Procedure Laterality Date   CATARACT EXTRACTION     CORONARY ANGIOPLASTY     HEMORRHOIDECTOMY WITH HEMORRHOID BANDING     PROSTATECTOMY     STRABISMUS SURGERY     TONSILLECTOMY      Allergies: Lorazepam  Medications: Prior to Admission medications   Medication Sig Start Date End Date Taking? Authorizing Provider  aspirin EC 81 MG tablet Take 81 mg by mouth daily.    [provider]  Cholecalciferol (VITAMIN D-3 PO) Take 1,000 Units by mouth daily.    [provider]  Cyanocobalamin (VITAMIN B12) 1000 MCG TBCR Take 2,500 mcg by mouth daily.    [provider]  Fluticasone Propionate (FLONASE ALLERGY RELIEF NA) Place 2 sprays into both nostrils daily as needed (Rhinitis).    [provider]  guaiFENesin (MUCINEX) 600 MG 12 hr tablet Take 1 tablet (600 mg total) by mouth 2 (two) times daily. 01/05/23   Regalado, Belkys A, MD  hydroxychloroquine (PLAQUENIL) 200 MG tablet Take 200 mg by mouth See admin instructions. Take 200 mg (1 tablet) every other day, alternating with 400 mg (2 tablets) every other day. 06/11/19   [provider]  ipratropium-albuterol (DUONEB) 0.5-2.5 (3) MG/3ML SOLN Take 3 mLs by nebulization 2 (two) times daily. 01/05/23   Regalado, Cassie Freer, MD  ketoconazole (NIZORAL) 2 % shampoo Apply 1 Application topically as needed for irritation. 02/13/19   [provider]  Lactobacillus (PROBIOTIC ACIDOPHILUS PO) Take 1 tablet by mouth daily.    [provider]  levothyroxine (SYNTHROID) 25 MCG tablet Take 25 mcg by mouth daily before breakfast. 04/29/20   [provider]  memantine (NAMENDA) 10 MG tablet Take 1 tablet (10 mg total) by mouth 2 (two) times daily. 05/31/22    [name], MD  metoprolol succinate (TOPROL-XL) 25 MG 24 hr tablet Take 12.5 mg by mouth daily. 08/20/16 01/02/23  [provider]  modafinil (PROVIGIL) 100 MG tablet Take 1 tablet (100 mg total) by mouth daily. 12/05/22   [name], MD  Multiple Vitamin (MULTIVITAMIN) capsule Take 1 capsule by mouth daily.    [provider]  Omega-3 Fatty Acids (FISH OIL) 1200 MG CAPS Take 1,200 mg by mouth daily.    [provider]  pantoprazole (PROTONIX) 40 MG tablet Take 1 tablet (40 mg total) by mouth daily. 01/05/23   Regalado, Belkys A, MD  QUEtiapine (SEROQUEL) 25 MG tablet Take 1 tablet (25 mg total) by mouth at bedtime. 05/31/22   [name], MD     Family History  Problem Relation Age of Onset   Stroke Mother    Dementia Mother    Cancer Father    Dementia Sister    Dementia Maternal Aunt     Social History   Socioeconomic History   Marital status: Married    Spouse name: Not on file   Number of children: Not on file   Years of education: Not on file   Highest education level: Doctorate  Occupational History   Not on file  Tobacco Use   Smoking status: Never   Smokeless tobacco: Never  Substance and Sexual Activity   Alcohol use: Not Currently   Drug use: Not Currently   Sexual activity: Not on file  Other Topics Concern   Not on file  Social History Narrative   Lives at [facility] with his wife in independent living   Social Determinants of Health   Financial Resource Strain: Not on file  Food Insecurity: No Food Insecurity (01/16/2023)   Hunger Vital Sign    Worried About Running Out of Food in the Last Year: Never true    Ran Out of Food in the Last Year: Never true  Transportation Needs: No Transportation Needs (01/02/2023)   PRAPARE - Hydrologist (Medical): No    Lack of Transportation (Non-Medical): No  Physical Activity: Not on file  Stress: Not on file  Social Connections: Not on file       Review of Systems see above; per d/w family pt denies CP, vomiting, or bleeding; does have mild HA, abd pain, recent nausea, back pain, some tachypnea  Vital Signs: BP (!) 148/89 (BP Location: Left Arm)   Pulse 96   Temp 99.5 F (37.5 C) (Axillary)   Resp 18   Ht 5\' 10"  (1.778 m)   Wt 212 lb 8.4 oz (96.4 kg)   SpO2 96%   BMI 30.49 kg/m      Physical Exam: pt lethargic, not interactive; family in room;  chest- CTA bilat; heart- RRR; abd- soft, few BS, tender RUQ to palpation; no LE edema  Imaging: CT Abdomen Pelvis W Contrast  Result Date: 01/15/2023 CLINICAL DATA:  Abdominal pain. EXAM: CT ABDOMEN AND PELVIS WITH CONTRAST TECHNIQUE: Multidetector CT imaging  of the abdomen and pelvis was performed using the standard protocol following bolus administration of intravenous contrast. RADIATION DOSE REDUCTION: This exam was performed according to the departmental dose-optimization program which includes automated exposure control, adjustment of the mA and/or kV according to patient size and/or use of iterative reconstruction technique. CONTRAST:  81mL OMNIPAQUE IOHEXOL 300 MG/ML  SOLN COMPARISON:  CT abdomen pelvis dated 01/02/2023. FINDINGS: Lower chest: There are bibasilar subpleural atelectasis. There is coronary vascular calcification. No intra-abdominal free air. Upper abdominal and perihepatic ascites. Hepatobiliary: The liver is unremarkable. There is mild biliary dilatation versus periportal edema. The gallbladder is distended. Multiple stones noted within the gallbladder. There is a impacted appearing stone in the neck of the gallbladder. There is extensive inflammatory changes and edema surrounding the gallbladder. Findings most consistent with acute cholecystitis. Pancreas: There is mild inflammatory changes and stranding adjacent to the pancreas, likely related to inflammatory changes of the gallbladder. Acute pancreatitis is less likely. Correlation with pancreatic enzymes  recommended. Spleen: Normal in size without focal abnormality. Adrenals/Urinary Tract: The adrenal glands are unremarkable. There is no hydronephrosis on either side. There is symmetric enhancement and excretion of contrast by both kidneys. The visualized ureters and urinary bladder appear unremarkable. Stomach/Bowel: There is no bowel obstruction or active inflammation. The appendix is normal. Vascular/Lymphatic: Moderate aortoiliac atherosclerotic disease. The IVC is unremarkable. No portal venous gas. There is no adenopathy. Reproductive: Prostatectomy. Other: Midline vertical anterior pelvic wall incisional scar. Musculoskeletal: Degenerative changes of the spine. No acute osseous pathology. IMPRESSION: 1. Cholelithiasis with findings of acute cholecystitis. 2. No bowel obstruction. Normal appendix. 3.  Aortic Atherosclerosis (ICD10-I70.0). Electronically Signed   By: Anner Crete M.D.   On: 01/15/2023 22:49   DG Chest Port 1 View  Result Date: 01/15/2023 CLINICAL DATA:  Questionable sepsis-evaluate for abnormality. 4 hours of upper abdominal pain and nausea. EXAM: PORTABLE CHEST 1 VIEW COMPARISON:  Radiographs 01/02/2023 FINDINGS: Stable cardiomediastinal silhouette given patient rotation. Aortic atherosclerotic calcification. Low lung volumes accentuate pulmonary vascularity. Bibasilar/perihilar atelectasis, decreased from 01/02/2023. Gaseous distention of the stomach. No displaced rib fractures. IMPRESSION: Low lung volumes.  No active disease. Electronically Signed   By: Placido Sou M.D.   On: 01/15/2023 21:55   US Abdomen Limited RUQ (LIVER/GB)  Result Date: 01/02/2023 CLINICAL DATA:  89 year old male with history of abdominal pain. EXAM: ULTRASOUND ABDOMEN LIMITED RIGHT UPPER QUADRANT COMPARISON:  CT of the abdomen and pelvis 01/02/2023. FINDINGS: Comment: Today's study is exceedingly limited secondary to poor sonographic windows (study is very limited by the presence of extensive bowel gas,  presumably related to colonic interposition as demonstrated on prior CT examination). Gallbladder: Poorly visualized. Common bile duct: Could not be definitively visualized. Liver: Poorly visualized. Other: None. IMPRESSION: 1. Poor quality essentially nondiagnostic study secondary to bowel gas interposed between the transducer in the liver/gallbladder. If there is clinical concern for acute cholecystitis, further evaluation with nuclear medicine hepatobiliary scan should be considered. Electronically Signed   By: Vinnie Langton M.D.   On: 01/02/2023 06:58   CT Renal Stone Study  Result Date: 01/02/2023 CLINICAL DATA:  Abdominal and flank pain, fever, weakness and diarrhea. EXAM: CT ABDOMEN AND PELVIS WITHOUT CONTRAST TECHNIQUE: Multidetector CT imaging of the abdomen and pelvis was performed following the standard protocol without IV contrast. RADIATION DOSE REDUCTION: This exam was performed according to the departmental dose-optimization program which includes automated exposure control, adjustment of the mA and/or kV according to patient size and/or use of iterative reconstruction technique. COMPARISON:  None Available. FINDINGS: Lower chest: Respiratory motion artifact limits evaluation of the lung bases. There is diffuse bronchial thickening. There is asymmetric opacity in the hypoinflated left lower lobe base which could be atelectasis or pneumonia. No other focal lung base abnormality is seen. Calcifications are scattered along the aortic valve leaflets. There are three-vessel coronary artery calcifications heaviest in the LAD and mild cardiomegaly. No pericardial effusion. Descending aortic atherosclerosis. Hepatobiliary: Respiratory motion also reduces fine detail in the abdomen, but the gallbladder does appear abnormal. It is dilated to 12 cm with multiple stones and there is suspected at least mild wall thickening with pericholecystic edema. The findings may be exaggerated due to breathing motion  but ultrasound follow-up is warranted. No abnormality of the unenhanced liver is seen through the breathing motion. Pancreas: Partially atrophic. No other abnormality is seen through the breathing motion. Spleen: No abnormality is seen through the breathing motion. Adrenals/Urinary Tract: There is no adrenal mass. No focal abnormality in the unenhanced renal cortex. No urinary stones are visible through the breathing motion. There is no hydronephrosis or ureteral stone. The bladder thickness is normal. Stomach/Bowel: There is a near circumferential irregular wall prominence at the junction of the body and antrum of the stomach, with air-fluid distention of the more proximal stomach. This could be due to focal gastritis or infiltrating disease. Upper endoscopy is recommended. The unopacified small bowel and appendix are unremarkable. There is moderate fecal stasis ascending and proximal transverse colon. Scattered colonic diverticulosis greatest in the sigmoid. There are faint stranding changes alongside the mid third of the descending colon, which could be due to colitis/diverticulitis or fat scarring from old diverticular disease. No other focal pericolic process is seen. Vascular/Lymphatic: Moderate to heavy aortoiliac calcific plaque. Fusiform infrarenal AAA measures 3.0 x 2.7 cm AP and transverse on 3:50, and extends for a length of 4 cm. No other vascular aneurysm is seen. Reproductive: Prior radical prostatectomy. Numerous surgical clips line the pelvic sidewalls and floor. No mass is seen in the prostate bed. Other: No abdominal wall hernia or abnormality. No abdominopelvic ascites. There is no free air or abscess. Musculoskeletal: Osteopenia with degenerative change thoracic and lumbar spine. Unilateral ankylosis right SI joint. No focal pathologic bone lesion. IMPRESSION: 1. Motion limited study. 2. Gallbladder dilatation with stones, suspected wall thickening and pericholecystic edema. Ultrasound follow-up  is warranted. 3. Near circumferential irregular wall prominence at the junction of the body and antrum of the stomach with air-fluid distention of the more proximal stomach. This could be due to focal gastritis or infiltrating disease. Upper endoscopy is recommended. 4. Faint stranding changes alongside the mid third of the descending colon which could be due to a mild colitis/diverticulitis or fat scarring from old diverticular disease. 5. Constipation and diverticulosis. 6. Aortic and coronary artery atherosclerosis. 7. 3.0 x 2.7 cm fusiform infrarenal AAA. Recommend follow-up every 3 years. 8. Bronchitis with asymmetric opacity in the hypoinflated left lower lobe base which could be atelectasis or pneumonia. 9. Prior radical prostatectomy. No mass is seen in the prostate bed. 10. Osteopenia and degenerative change. Aortic Atherosclerosis (ICD10-I70.0). Electronically Signed   By: Telford Nab M.D.   On: 01/02/2023 04:42   CT Head Wo Contrast  Result Date: 01/02/2023 CLINICAL DATA:  89 year old male with fever, weakness, diarrhea, altered mental status. EXAM: CT HEAD WITHOUT CONTRAST TECHNIQUE: Contiguous axial images were obtained from the base of the skull through the vertex without intravenous contrast. RADIATION DOSE REDUCTION: This exam was performed according to the  departmental dose-optimization program which includes automated exposure control, adjustment of the mA and/or kV according to patient size and/or use of iterative reconstruction technique. COMPARISON:  Brain MRI 09/07/2017.  Head CT 02/16/2021. FINDINGS: Study is mildly degraded by motion artifact despite repeated imaging attempts. Brain: Stable cerebral volume since 2022. No midline shift, ventriculomegaly, mass effect, evidence of mass lesion, intracranial hemorrhage or evidence of cortically based acute infarction. Stable and normal for age gray-white matter differentiation throughout the brain. No cortical encephalomalacia identified.  Vascular: Calcified atherosclerosis at the skull base. Skull: No acute osseous abnormality identified. Sinuses/Orbits: Mild chronic left mastoid effusion is stable. Other visualized paranasal sinuses and mastoids are stable and well aerated. Tympanic cavities are clear. Other: No acute orbit or scalp soft tissue finding. IMPRESSION: 1. Mildly motion degraded, but negative for age non contrast CT appearance of the brain. 2. Mild chronic left mastoid effusion. Electronically Signed   By: Genevie Ann M.D.   On: 01/02/2023 04:14   DG Chest Port 1 View  Result Date: 01/02/2023 CLINICAL DATA:  QUESTIONABLE SEPSIS WITH COUGH, FEVER, NAUSEA AND DIARRHEA. EXAM: PORTABLE CHEST 1 VIEW COMPARISON:  PORTABLE CHEST 05/08/2022 FINDINGS: Due to positioning the patient's chin and neck obscure the lung apices. The lungs are expiratory. The stomach is gas distended as before. Peribronchial cuffing continues to be noted with perihilar linear atelectasis. The visualized lungs are clear. The lower lung zones are obscured due to expiration. The sulci are sharp. The mediastinal configuration is stable. There is aortic atherosclerosis. Normal cardiac size and central vessels. There is thoracic spondylosis and osteopenia. No acute osseous findings. Overlying monitor wires. IMPRESSION: 1. Limited study due to positioning and expiration. 2. Peribronchial cuffing with perihilar linear atelectasis. 3. No convincing new lung opacities. 4. The stomach gas distended as before. Electronically Signed   By: Telford Nab M.D.   On: 01/02/2023 03:23    Labs:  CBC: Recent Labs    01/02/23 0256 01/02/23 0312 01/03/23 0441 01/15/23 2108 01/16/23 0514  WBC 9.6  --  8.1 41.0* 37.3*  HGB 12.1* 12.6* 11.6* 15.0 13.8  HCT 37.9* 37.0* 35.6* 46.5 43.8  PLT 214  --  195 290 210    COAGS: Recent Labs    01/02/23 0256 01/15/23 2108  INR 1.1 1.1  APTT 28 26    BMP: Recent Labs    01/03/23 0441 01/04/23 0901 01/15/23 2108  01/16/23 0514  NA 137 137 145 143  K 4.2 3.9 4.9 4.6  CL 106 105 110 109  CO2 24 21* 22 27  GLUCOSE 137* 119* 248* 157*  BUN 25* 34* 40* 35*  CALCIUM 8.5* 8.5* 9.1 8.5*  CREATININE 1.32*  1.43* 1.38* 1.58* 1.43*  GFRNONAA 50*  46* 48* 41* 46*    LIVER FUNCTION TESTS: Recent Labs    01/03/23 0441 01/04/23 0901 01/15/23 2108 01/16/23 0514  BILITOT 0.5 0.6 2.0* 1.7*  AST 49* 31 98* 68*  ALT 55* 48* 207* 171*  ALKPHOS 66 70 124 103  PROT 5.9* 6.2* 6.8 5.9*  ALBUMIN 3.2* 3.3* 3.5 2.9*    TUMOR MARKERS: No results for input(s): "AFPTM", "CEA", "CA199", "CHROMGRNA" in the last 8760 hours.  Assessment and Plan: 89 yo male admitted with acute calculous cholecystitis; also with PMH sig for Alzheimer's disease, angina, cardiomyopathy, COPD, GERD, colon polyps, hyperlipidemia, hypertension, osteoporosis, polymyalgia rheumatica, prostate cancer, rheumatoid arthritis, coronary artery disease, chronic kidney disease, obstructive sleep apnea not on CPAP as well as recent COVID last month with persistent  positivity; pt seen by CCS and deemed poor operative candidate; request now received for percutaneous cholecystostomy; Imaging studies were reviewed by Dr. Vernard Gambles.Risks and benefits discussed with the patient's family  including bleeding, infection, damage to adjacent structures,  and sepsis.  All of the patient's family's questions were answered, patient's family is agreeable to proceed. Consent signed and in chart. Procedure tent scheduled for today   Thank you for this interesting consult.  I greatly enjoyed meeting ROBI WARSTLER and look forward to participating in their care.  A copy of this report was sent to the requesting provider on this date.  Electronically Signed: D. Rowe Robert, PA-C 01/16/2023, 9:42 AM   I spent a total of  25 minutes   in face to face in clinical consultation, greater than 50% of which was counseling/coordinating care for percutaneous cholecystostomy

## 2023-01-16 NOTE — Sepsis Progress Note (Signed)
Notified provider of need to consider ordering additional fluid bolus of 700 ml for the LA of 4.8 and another lactic acid draw in a couple of hours.

## 2023-01-16 NOTE — Progress Notes (Signed)
Pharmacy Antibiotic Note  Devin Vance is a 89 y.o. male admitted on 01/15/2023 with medical history significant for Alzheimer's dementia, CAD, CKD stage IIIb, COPD, rheumatoid arthritis, HTN, HLD, prostate cancer, OSA not using CPAP who presented to the ED for evaluation of abdominal pain with nausea. .  Pharmacy has been consulted for zosyn dosing.  Plan: Zosyn 3.375g IV q8h (4 hour infusion). Follow renal function and clinical course  Height: 5\' 10"  (177.8 cm) Weight: 90.7 kg (200 lb) IBW/kg (Calculated) : 73  Temp (24hrs), Avg:98.7 F (37.1 C), Min:97.7 F (36.5 C), Max:99.8 F (37.7 C)  Recent Labs  Lab 01/15/23 2108 01/15/23 2353  WBC 41.0*  --   CREATININE 1.58*  --   LATICACIDVEN 3.4* 4.8*    Estimated Creatinine Clearance: 33.1 mL/min (A) (by C-G formula based on SCr of 1.58 mg/dL (H)).    Allergies  Allergen Reactions   Lorazepam Other (See Comments)    Generalized weakness     Thank you for allowing pharmacy to be a part of this patient's care.  Dolly Rias RPh 01/16/2023, 2:34 AM

## 2023-01-16 NOTE — Progress Notes (Signed)
PROGRESS NOTE    Devin Vance  [id number] DOB: 02/02/1928 DOA: 01/15/2023 PCP: Wenda Low, MD   Brief Narrative:  89 y.o. male with medical history significant for Alzheimer's dementia, CAD, CKD stage IIIb, COPD, rheumatoid arthritis, HTN, HLD, prostate cancer, OSA not using CPAP, recent hospitalization from 01/02/2023-12/07/2022 with COVID-19 infection treated with Paxlovid and oral steroids presented with worsening abdominal pain and nausea along with fever.  On presentation, WBC was 41, creatinine 1.58, AST 98, ALT 207, alkaline phosphatase 124, total bili of 2, lipase 24, lactic acid 3.4.  COVID-19 PCR positive.  Chest x-ray showed low lung volumes without focal consolidation, edema or effusion.  CT of abdomen/pelvis with contrast showed cholelithiasis with acute cholecystitis.  General surgery was consulted.  He was started on IV fluids and antibiotics.  Assessment & Plan:   Severe sepsis: Present on admission Acute calculus cholecystitis Elevated LFTs Lactic acidosis -Presented with significant leukocytosis, tachycardia, tachypnea, lactic acidosis and CT findings consistent with acute calculus cholecystitis -Continue Zosyn.  Follow formal general surgery recommendations.  Continue n.p.o. for now.  Continue pain management and IV fluids.  Follow cultures. -Monitor LFTs  Leukocytosis -Slightly improving but still significant at 37.3.  CKD stage IIIb -monitor creatinine.  Creatinine close to baseline  COVID-19 positive -Initially positive 01/02/2023 and treated with Paxlovid. This is a persistent positive and clinically nonsignificant.   CAD -Stable.  Aspirin on hold  Hypertension -Monitor blood pressure.  Antihypertensives on hold for now  Alzheimer's dementia -Namenda, Provigil, Seroquel on hold for now  Rheumatoid arthritis -Plaquenil on hold for now  Hypothyroidism--resume Synthroid when taking orals  OSA -Does not use CPAP  Goals of care -Overall prognosis is  guarded to poor.  Consult palliative care for goals of care discussion.  DVT prophylaxis: SCDs Code Status: DNR Family Communication: Daughter and wife at bedside Disposition Plan: Status is: Inpatient Remains inpatient appropriate because: Of severity of illness  Consultants: General surgery.  Consult palliative care  Procedures: None  Antimicrobials: Zosyn from 01/15/23 onwards   Subjective: Patient seen and examined at bedside.  Sleepy.  No fever, seizures or vomiting reported.  Objective: Vitals:   01/16/23 0204 01/16/23 0400 01/16/23 0434 01/16/23 0538  BP: (!) 161/94 (!) 171/90  (!) 148/89  Pulse: (!) 103 94  96  Resp: (!) 25 (!) 22  18  Temp:    99.5 F (37.5 C)  TempSrc:    Axillary  SpO2: 93% 95%  96%  Weight:   96.4 kg   Height:   5\' 10"  (1.778 m)     Intake/Output Summary (Last 24 hours) at 01/16/2023 1033 Last data filed at 01/16/2023 0700 Gross per 24 hour  Intake 2200 ml  Output --  Net 2200 ml   Filed Weights   01/15/23 2100 01/16/23 0434  Weight: 90.7 kg 96.4 kg    Examination:  General exam: Appears calm and comfortable.  Looks chronically ill and deconditioned.  Elderly male lying in bed. Respiratory system: Bilateral decreased breath sounds at bases with scattered crackles and intermittent tachypnea Cardiovascular system: S1 & S2 heard, Rate controlled currently Gastrointestinal system: Abdomen is distended, soft and mildly tender in the right upper quadrant.  Normal bowel sounds heard. Extremities: No cyanosis, clubbing, edema  Central nervous system: Drowsy, wakes up slightly, hardly answers any questions. No focal neurological deficits. Moving extremities Skin: No rashes, lesions or ulcers Psychiatry: Could not be assessed because of mental status.  Currently not agitated    Data Reviewed: I  have personally reviewed following labs and imaging studies  CBC: Recent Labs  Lab 01/15/23 2108 01/16/23 0514  WBC 41.0* 37.3*  NEUTROABS 38.8*   --   HGB 15.0 13.8  HCT 46.5 43.8  MCV 93.0 92.8  PLT 290 [id number]   Basic Metabolic Panel: Recent Labs  Lab 01/15/23 2108 01/16/23 0514  NA 145 143  K 4.9 4.6  CL 110 109  CO2 22 27  GLUCOSE 248* 157*  BUN 40* 35*  CREATININE 1.58* 1.43*  CALCIUM 9.1 8.5*   GFR: Estimated Creatinine Clearance: 37.6 mL/min (A) (by C-G formula based on SCr of 1.43 mg/dL (H)). Liver Function Tests: Recent Labs  Lab 01/15/23 2108 01/16/23 0514  AST 98* 68*  ALT 207* 171*  ALKPHOS 124 103  BILITOT 2.0* 1.7*  PROT 6.8 5.9*  ALBUMIN 3.5 2.9*   Recent Labs  Lab 01/15/23 2108  LIPASE 24   No results for input(s): "AMMONIA" in the last 168 hours. Coagulation Profile: Recent Labs  Lab 01/15/23 2108  INR 1.1   Cardiac Enzymes: No results for input(s): "CKTOTAL", "CKMB", "CKMBINDEX", "TROPONINI" in the last 168 hours. BNP (last 3 results) No results for input(s): "PROBNP" in the last 8760 hours. HbA1C: No results for input(s): "HGBA1C" in the last 72 hours. CBG: Recent Labs  Lab 01/16/23 0209 01/16/23 0431 01/16/23 0740  GLUCAP 175* 148* 167*   Lipid Profile: No results for input(s): "CHOL", "HDL", "LDLCALC", "TRIG", "CHOLHDL", "LDLDIRECT" in the last 72 hours. Thyroid Function Tests: No results for input(s): "TSH", "T4TOTAL", "FREET4", "T3FREE", "THYROIDAB" in the last 72 hours. Anemia Panel: No results for input(s): "VITAMINB12", "FOLATE", "FERRITIN", "TIBC", "IRON", "RETICCTPCT" in the last 72 hours. Sepsis Labs: Recent Labs  Lab 01/15/23 2108 01/15/23 2353 01/16/23 0514  PROCALCITON  --   --  1.49  LATICACIDVEN 3.4* 4.8* 2.0*    Recent Results (from the past 240 hour(s))  Blood Culture (routine x 2)     Status: None (Preliminary result)   Collection Time: 01/15/23  9:55 PM   Specimen: BLOOD  Result Value Ref Range Status   Specimen Description   Final    BLOOD BLOOD RIGHT FOREARM Performed at [facility] [address] [place], [place]  [address]    Special Requests   Final    BOTTLES DRAWN AEROBIC AND ANAEROBIC Blood Culture adequate volume Performed at [facility] [address], [place], [place] [address]    Culture   Final    NO GROWTH < 12 HOURS Performed at [facility] [address] [place], [place] [address]    Report Status PENDING  Incomplete  Blood Culture (routine x 2)     Status: None (Preliminary result)   Collection Time: 01/15/23 10:00 PM   Specimen: BLOOD  Result Value Ref Range Status   Specimen Description   Final    BLOOD RIGHT ANTECUBITAL Performed at [facility] [address] [place], [place] [address]    Special Requests   Final    BOTTLES DRAWN AEROBIC AND ANAEROBIC Blood Culture adequate volume Performed at [facility] [address], [place], [place] [address]    Culture   Final    NO GROWTH < 12 HOURS Performed at [facility] [address], [place],  [address]    Report Status PENDING  Incomplete  Resp panel by RT-PCR (RSV, Flu A&B, Covid) Anterior Nasal Swab     Status: Abnormal  Collection Time: 01/15/23 10:17 PM   Specimen: Anterior Nasal Swab  Result Value Ref Range Status   SARS Coronavirus 2 by RT PCR POSITIVE (A) NEGATIVE Final    Comment: (NOTE) SARS-CoV-2 target nucleic acids are DETECTED.  The SARS-CoV-2 RNA is generally detectable in upper respiratory specimens during the acute phase of infection. Positive results are indicative of the presence of the identified virus, but do not rule out bacterial infection or co-infection with other pathogens not detected by the test. Clinical correlation with patient history and other diagnostic information is necessary to determine patient infection status. The expected result is Negative.  Fact Sheet for Patients: EntrepreneurPulse.com.au  Fact Sheet for Healthcare  Providers: IncredibleEmployment.be  This test is not yet approved or cleared by the [place] FDA and  has been authorized for detection and/or diagnosis of SARS-CoV-2 by FDA under an Emergency Use Authorization (EUA).  This EUA will remain in effect (meaning this test can be used) for the duration of  the COVID-19 declaration under Section 564(b)(1) of the A ct, 21 U.S.C. section 360bbb-3(b)(1), unless the authorization is terminated or revoked sooner.     Influenza A by PCR NEGATIVE NEGATIVE Final   Influenza B by PCR NEGATIVE NEGATIVE Final    Comment: (NOTE) The Xpert Xpress SARS-CoV-2/FLU/RSV plus assay is intended as an aid in the diagnosis of influenza from Nasopharyngeal swab specimens and should not be used as a sole basis for treatment. Nasal washings and aspirates are unacceptable for Xpert Xpress SARS-CoV-2/FLU/RSV testing.  Fact Sheet for Patients: EntrepreneurPulse.com.au  Fact Sheet for Healthcare Providers: IncredibleEmployment.be  This test is not yet approved or cleared by the [place] FDA and has been authorized for detection and/or diagnosis of SARS-CoV-2 by FDA under an Emergency Use Authorization (EUA). This EUA will remain in effect (meaning this test can be used) for the duration of the COVID-19 declaration under Section 564(b)(1) of the Act, 21 U.S.C. section 360bbb-3(b)(1), unless the authorization is terminated or revoked.     Resp Syncytial Virus by PCR NEGATIVE NEGATIVE Final    Comment: (NOTE) Fact Sheet for Patients: EntrepreneurPulse.com.au  Fact Sheet for Healthcare Providers: IncredibleEmployment.be  This test is not yet approved or cleared by the [place] FDA and has been authorized for detection and/or diagnosis of SARS-CoV-2 by FDA under an Emergency Use Authorization (EUA). This EUA will remain in effect (meaning this test can be  used) for the duration of the COVID-19 declaration under Section 564(b)(1) of the Act, 21 U.S.C. section 360bbb-3(b)(1), unless the authorization is terminated or revoked.  Performed at [facility], [place] [address] [place], [place] [address]          Radiology Studies: CT Abdomen Pelvis W Contrast  Result Date: 01/15/2023 CLINICAL DATA:  Abdominal pain. EXAM: CT ABDOMEN AND PELVIS WITH CONTRAST TECHNIQUE: Multidetector CT imaging of the abdomen and pelvis was performed using the standard protocol following bolus administration of intravenous contrast. RADIATION DOSE REDUCTION: This exam was performed according to the departmental dose-optimization program which includes automated exposure control, adjustment of the mA and/or kV according to patient size and/or use of iterative reconstruction technique. CONTRAST:  39mL OMNIPAQUE IOHEXOL 300 MG/ML  SOLN COMPARISON:  CT abdomen pelvis dated 01/02/2023. FINDINGS: Lower chest: There are bibasilar subpleural atelectasis. There is coronary vascular calcification. No intra-abdominal free air. Upper abdominal and perihepatic ascites. Hepatobiliary: The liver is unremarkable. There is mild biliary dilatation versus periportal edema. The gallbladder is distended. Multiple stones noted within the  gallbladder. There is a impacted appearing stone in the neck of the gallbladder. There is extensive inflammatory changes and edema surrounding the gallbladder. Findings most consistent with acute cholecystitis. Pancreas: There is mild inflammatory changes and stranding adjacent to the pancreas, likely related to inflammatory changes of the gallbladder. Acute pancreatitis is less likely. Correlation with pancreatic enzymes recommended. Spleen: Normal in size without focal abnormality. Adrenals/Urinary Tract: The adrenal glands are unremarkable. There is no hydronephrosis on either side. There is symmetric enhancement and excretion of contrast by both  kidneys. The visualized ureters and urinary bladder appear unremarkable. Stomach/Bowel: There is no bowel obstruction or active inflammation. The appendix is normal. Vascular/Lymphatic: Moderate aortoiliac atherosclerotic disease. The IVC is unremarkable. No portal venous gas. There is no adenopathy. Reproductive: Prostatectomy. Other: Midline vertical anterior pelvic wall incisional scar. Musculoskeletal: Degenerative changes of the spine. No acute osseous pathology. IMPRESSION: 1. Cholelithiasis with findings of acute cholecystitis. 2. No bowel obstruction. Normal appendix. 3.  Aortic Atherosclerosis (ICD10-I70.0). Electronically Signed   By: Anner Crete M.D.   On: 01/15/2023 22:49   DG Chest Port 1 View  Result Date: 01/15/2023 CLINICAL DATA:  Questionable sepsis-evaluate for abnormality. 4 hours of upper abdominal pain and nausea. EXAM: PORTABLE CHEST 1 VIEW COMPARISON:  Radiographs 01/02/2023 FINDINGS: Stable cardiomediastinal silhouette given patient rotation. Aortic atherosclerotic calcification. Low lung volumes accentuate pulmonary vascularity. Bibasilar/perihilar atelectasis, decreased from 01/02/2023. Gaseous distention of the stomach. No displaced rib fractures. IMPRESSION: Low lung volumes.  No active disease. Electronically Signed   By: Placido Sou M.D.   On: 01/15/2023 21:55        Scheduled Meds:  insulin aspart  0-6 Units Subcutaneous Q4H   sodium chloride flush  3 mL Intravenous Q12H   Continuous Infusions:  lactated ringers 125 mL/hr at 01/16/23 0212   piperacillin-tazobactam (ZOSYN)  IV 3.375 g (01/16/23 0946)          Aline August, MD [facility] 01/16/2023, 10:33 AM

## 2023-01-16 NOTE — Procedures (Signed)
  Procedure:  [place] and fluoro guided perc cholecystostomy catheter placement 4f Preprocedure diagnosis: The primary encounter diagnosis was Acute calculous cholecystitis. A diagnosis of Sepsis, due to unspecified organism, unspecified whether acute organ dysfunction present was also pertinent to this visit. Postprocedure diagnosis: same EBL:    minimal Complications:   none immediate  See full dictation in [facility].  Dillard Cannon MD Main # [phone] Pager  [phone] Mobile [phone]

## 2023-01-16 NOTE — Consult Note (Signed)
Surgical Evaluation Requesting provider: Dr. Aline August  Chief Complaint: Abdominal pain  HPI: History is taken from chart review and discussion with the patient's family, as the patient's mental status prohibits him from getting a history due to severe Alzheimer's dementia.  This is a 89 year old male with hypertension, GERD, Alzheimer's dementia, CAD, obstructive sleep apnea, CKD stage III, abdominal aortic aneurysm, rheumatoid arthritis and history of prostate cancer who presented to the room yesterday evening with abdominal pain and fever.  His family has noted that he has been lifting up his shirt and gesturing towards his upper abdomen frequently over the last couple of weeks, and notes that he has not been eating well and seems to have lack of appetite and nausea for the last several days.  He was recently discharged (3/23) from the hospital where he was treated for COVID with a several day admission and he did have a CT and ultrasound of his gallbladder at that time concerning for cholecystitis but at that point it was thought his symptoms may be more secondary to his severe COVID infection.  He was treated conservatively and his symptoms seem to improve so he was discharged home.  Unfortunately has continued to have symptoms and returns now, with a CT scan as below showing severe inflammatory changes surrounding the gallbladder as well as gallstones.  He was noted to be somewhat dehydrated on initial evaluation in the emergency room yesterday. This morning he is afebrile, his heart rate is improved to the 90s, his lactic acidosis has improved significantly from 4.8-2.0, his CMP is slightly better than it was on presentation, but he has a persistent leukocytosis of 37.3 from 41.  Allergies  Allergen Reactions   Lorazepam Other (See Comments)    Generalized weakness    Past Medical History:  Diagnosis Date   Alzheimer disease 07/31/2018   Angina pectoris    Arthritis    Cardiomyopathy     COPD (chronic obstructive pulmonary disease)    Gastroesophageal reflux disease    History of colon polyps    Hyperlipidemia    Hypertension    Obesity    Osteoporosis    Polymyalgia rheumatica    Prostate cancer    Rheumatoid arthritis    Secondary Parkinson disease 08/13/2019   Sleep apnea     Past Surgical History:  Procedure Laterality Date   CATARACT EXTRACTION     CORONARY ANGIOPLASTY     HEMORRHOIDECTOMY WITH HEMORRHOID BANDING     PROSTATECTOMY     STRABISMUS SURGERY     TONSILLECTOMY      Family History  Problem Relation Age of Onset   Stroke Mother    Dementia Mother    Cancer Father    Dementia Sister    Dementia Maternal Aunt     Social History   Socioeconomic History   Marital status: Married    Spouse name: Not on file   Number of children: Not on file   Years of education: Not on file   Highest education level: Doctorate  Occupational History   Not on file  Tobacco Use   Smoking status: Never   Smokeless tobacco: Never  Substance and Sexual Activity   Alcohol use: Not Currently   Drug use: Not Currently   Sexual activity: Not on file  Other Topics Concern   Not on file  Social History Narrative   Lives at [facility] with his wife in independent living   Social Determinants of Radio broadcast assistant  Strain: Not on file  Food Insecurity: No Food Insecurity (01/16/2023)   Hunger Vital Sign    Worried About Running Out of Food in the Last Year: Never true    Ran Out of Food in the Last Year: Never true  Transportation Needs: No Transportation Needs (01/02/2023)   PRAPARE - Hydrologist (Medical): No    Lack of Transportation (Non-Medical): No  Physical Activity: Not on file  Stress: Not on file  Social Connections: Not on file    No current facility-administered medications on file prior to encounter.   Current Outpatient Medications on File Prior to Encounter  Medication Sig Dispense Refill    aspirin EC 81 MG tablet Take 81 mg by mouth daily.     Cholecalciferol (VITAMIN D-3 PO) Take 1,000 Units by mouth daily.     Cyanocobalamin (VITAMIN B12) 1000 MCG TBCR Take 2,500 mcg by mouth daily.     Fluticasone Propionate (FLONASE ALLERGY RELIEF NA) Place 2 sprays into both nostrils daily as needed (Rhinitis).     guaiFENesin (MUCINEX) 600 MG 12 hr tablet Take 1 tablet (600 mg total) by mouth 2 (two) times daily. 30 tablet 0   hydroxychloroquine (PLAQUENIL) 200 MG tablet Take 200 mg by mouth See admin instructions. Take 200 mg (1 tablet) every other day, alternating with 400 mg (2 tablets) every other day.     ipratropium-albuterol (DUONEB) 0.5-2.5 (3) MG/3ML SOLN Take 3 mLs by nebulization 2 (two) times daily. 540 mL 0   ketoconazole (NIZORAL) 2 % shampoo Apply 1 Application topically as needed for irritation.     Lactobacillus (PROBIOTIC ACIDOPHILUS PO) Take 1 tablet by mouth daily.     levothyroxine (SYNTHROID) 25 MCG tablet Take 25 mcg by mouth daily before breakfast.     memantine (NAMENDA) 10 MG tablet Take 1 tablet (10 mg total) by mouth 2 (two) times daily. 180 tablet 4   metoprolol succinate (TOPROL-XL) 25 MG 24 hr tablet Take 12.5 mg by mouth daily.     modafinil (PROVIGIL) 100 MG tablet Take 1 tablet (100 mg total) by mouth daily. 30 tablet 4   Multiple Vitamin (MULTIVITAMIN) capsule Take 1 capsule by mouth daily.     Omega-3 Fatty Acids (FISH OIL) 1200 MG CAPS Take 1,200 mg by mouth daily.     pantoprazole (PROTONIX) 40 MG tablet Take 1 tablet (40 mg total) by mouth daily. 30 tablet 0   QUEtiapine (SEROQUEL) 25 MG tablet Take 1 tablet (25 mg total) by mouth at bedtime. 90 tablet 4    Review of Systems: a complete, 10pt review of systems was unable to be completed due to patient mental status  Physical Exam: Vitals:   01/16/23 0400 01/16/23 0538  BP: (!) 171/90 (!) 148/89  Pulse: 94 96  Resp: (!) 22 18  Temp:  99.5 F (37.5 C)  SpO2: 95% 96%   Somnolent, not interactive  this morning Unlabored respirations Abdomen is soft, nondistended, he does grimace slightly with palpation of the right upper quadrant currently but his family states he recently got pain medication.      Latest Ref Rng & Units 01/16/2023    5:14 AM 01/15/2023    9:08 PM 01/03/2023    4:41 AM  CBC  WBC 4.0 - 10.5 K/uL 37.3  41.0  8.1   Hemoglobin 13.0 - 17.0 g/dL 13.8  15.0  11.6   Hematocrit 39.0 - 52.0 % 43.8  46.5  35.6   Platelets 150 -  400 K/uL 210  290  195        Latest Ref Rng & Units 01/16/2023    5:14 AM 01/15/2023    9:08 PM 01/04/2023    9:01 AM  CMP  Glucose 70 - 99 mg/dL 157  248  119   BUN 8 - 23 mg/dL 35  40  34   Creatinine 0.61 - 1.24 mg/dL 1.43  1.58  1.38   Sodium 135 - 145 mmol/L 143  145  137   Potassium 3.5 - 5.1 mmol/L 4.6  4.9  3.9   Chloride 98 - 111 mmol/L 109  110  105   CO2 22 - 32 mmol/L 27  22  21    Calcium 8.9 - 10.3 mg/dL 8.5  9.1  8.5   Total Protein 6.5 - 8.1 g/dL 5.9  6.8  6.2   Total Bilirubin 0.3 - 1.2 mg/dL 1.7  2.0  0.6   Alkaline Phos 38 - 126 U/L 103  124  70   AST 15 - 41 U/L 68  98  31   ALT 0 - 44 U/L 171  207  48     Lab Results  Component Value Date   INR 1.1 01/15/2023   INR 1.1 01/02/2023    Imaging: CT Abdomen Pelvis W Contrast  Result Date: 01/15/2023 CLINICAL DATA:  Abdominal pain. EXAM: CT ABDOMEN AND PELVIS WITH CONTRAST TECHNIQUE: Multidetector CT imaging of the abdomen and pelvis was performed using the standard protocol following bolus administration of intravenous contrast. RADIATION DOSE REDUCTION: This exam was performed according to the departmental dose-optimization program which includes automated exposure control, adjustment of the mA and/or kV according to patient size and/or use of iterative reconstruction technique. CONTRAST:  90mL OMNIPAQUE IOHEXOL 300 MG/ML  SOLN COMPARISON:  CT abdomen pelvis dated 01/02/2023. FINDINGS: Lower chest: There are bibasilar subpleural atelectasis. There is coronary vascular  calcification. No intra-abdominal free air. Upper abdominal and perihepatic ascites. Hepatobiliary: The liver is unremarkable. There is mild biliary dilatation versus periportal edema. The gallbladder is distended. Multiple stones noted within the gallbladder. There is a impacted appearing stone in the neck of the gallbladder. There is extensive inflammatory changes and edema surrounding the gallbladder. Findings most consistent with acute cholecystitis. Pancreas: There is mild inflammatory changes and stranding adjacent to the pancreas, likely related to inflammatory changes of the gallbladder. Acute pancreatitis is less likely. Correlation with pancreatic enzymes recommended. Spleen: Normal in size without focal abnormality. Adrenals/Urinary Tract: The adrenal glands are unremarkable. There is no hydronephrosis on either side. There is symmetric enhancement and excretion of contrast by both kidneys. The visualized ureters and urinary bladder appear unremarkable. Stomach/Bowel: There is no bowel obstruction or active inflammation. The appendix is normal. Vascular/Lymphatic: Moderate aortoiliac atherosclerotic disease. The IVC is unremarkable. No portal venous gas. There is no adenopathy. Reproductive: Prostatectomy. Other: Midline vertical anterior pelvic wall incisional scar. Musculoskeletal: Degenerative changes of the spine. No acute osseous pathology. IMPRESSION: 1. Cholelithiasis with findings of acute cholecystitis. 2. No bowel obstruction. Normal appendix. 3.  Aortic Atherosclerosis (ICD10-I70.0). Electronically Signed   By: Anner Crete M.D.   On: 01/15/2023 22:49   DG Chest Port 1 View  Result Date: 01/15/2023 CLINICAL DATA:  Questionable sepsis-evaluate for abnormality. 4 hours of upper abdominal pain and nausea. EXAM: PORTABLE CHEST 1 VIEW COMPARISON:  Radiographs 01/02/2023 FINDINGS: Stable cardiomediastinal silhouette given patient rotation. Aortic atherosclerotic calcification. Low lung volumes  accentuate pulmonary vascularity. Bibasilar/perihilar atelectasis, decreased from 01/02/2023. Gaseous distention of  the stomach. No displaced rib fractures. IMPRESSION: Low lung volumes.  No active disease. Electronically Signed   By: Placido Sou M.D.   On: 01/15/2023 21:55     A/P: 89 year old man with multiple severe medical problems as described who presents with ongoing evidence of cholecystitis and significant progression of inflammatory changes on CT.  He is not a candidate for surgical intervention at this time and we do recommend proceeding with percutaneous cholecystostomy tube.  I discussed this treatment plan with his family who are in agreement.  We discussed that this tube would need to stay in for at least 6 weeks and possibly indefinitely.  I discussed with him that I do not think he will be a candidate for gallbladder surgery in the future, but that we will plan to reassess him and follow his progress both here in the hospital and as an outpatient.  Questions were welcomed and answered.  His daughter is an ultrasound tech, his wife is his power of attorney.  Depending on clinical course after cholecystostomy tube placement, it may be beneficial to have palliative care evaluate him, as his wife was inquiring about long-term planning as she is not really able to take care of him by herself at home.  I have placed an order for TOC to speak with them as well.    Patient Active Problem List   Diagnosis Date Noted   Acute calculous cholecystitis 01/16/2023   Hyperglycemia 01/16/2023   Coronary artery disease 01/16/2023   Hypothyroidism 01/16/2023   Prostate cancer 01/15/2023   Polymyalgia rheumatica 01/15/2023   Severe sepsis with lactic acidosis 01/15/2023   Lab test positive for detection of COVID-19 virus 01/02/2023   AAA (abdominal aortic aneurysm) 01/02/2023   Dilation of stomach 01/02/2023   Chronic diarrhea 01/02/2023   Alzheimer's dementia with agitation 06/02/2022   AMS  (altered mental status) 02/16/2021   Gait abnormality 07/15/2019   Cardiomyopathy 03/24/2019   COPD (chronic obstructive pulmonary disease) 03/24/2019   HLD (hyperlipidemia) 03/24/2019   Acute renal failure superimposed on stage 3 chronic kidney disease 10/28/2018   Chronic kidney disease, stage 3b 10/28/2018   Rheumatoid arthritis involving multiple sites 10/28/2018   Dementia with behavioral disturbance 10/27/2018   Alzheimer's dementia with behavioral disturbance 07/31/2018   Essential hypertension 08/15/2017   Anemia, mild 02/12/2017   Olecranon bursitis of right elbow 02/12/2017   Kyphosis of cervical region 10/26/2015   Intermittent alternating esotropia 05/18/2015   GERD (gastroesophageal reflux disease) 01/22/2014   Angina pectoris 01/22/2014       Romana Juniper, MD [facility]  See AMION to contact appropriate on-call provider   MDM- high

## 2023-01-16 NOTE — TOC Initial Note (Addendum)
Transition of Care [facility]) - Initial/Assessment Note    Patient Details  Name: Devin Vance MRN: [id number] Date of Birth: 1928/09/15  [facility] CM/SW Contact:    Dessa Phi, RN Phone Number: 01/16/2023, 2:25 PM  Clinical Narrative: From [facility]-left vm wDanielle rep await call back, left vm w/Nina(spouse) await call back.  May need PT eval.  -4p-spoke to spouse, & dtr(Susan)-contact person going forward-has [employer] private duty-custodial level care-3x/week 18hrs-[employer] benefit.Has rw/w/c,needs asst w/adl's.               Expected Discharge Plan:  (TBD) Barriers to Discharge: Continued Medical Work up   Patient Goals and CMS Choice            Expected Discharge Plan and Services                                              Prior Living Arrangements/Services                       Activities of Daily Living      Permission Sought/Granted                  Emotional Assessment              Admission diagnosis:  Acute calculous cholecystitis [K80.00] Sepsis, due to unspecified organism, unspecified whether acute organ dysfunction present [A41.9] Severe sepsis with lactic acidosis [A41.9, R65.20, E87.20] Patient Active Problem List   Diagnosis Date Noted   Acute calculous cholecystitis 01/16/2023   Hyperglycemia 01/16/2023   Coronary artery disease 01/16/2023   Hypothyroidism 01/16/2023   Prostate cancer 01/15/2023   Polymyalgia rheumatica 01/15/2023   Severe sepsis with lactic acidosis 01/15/2023   Lab test positive for detection of COVID-19 virus 01/02/2023   AAA (abdominal aortic aneurysm) 01/02/2023   Dilation of stomach 01/02/2023   Chronic diarrhea 01/02/2023   Alzheimer's dementia with agitation 06/02/2022   AMS (altered mental status) 02/16/2021   Gait abnormality 07/15/2019   Cardiomyopathy 03/24/2019   COPD (chronic obstructive pulmonary disease) 03/24/2019   HLD (hyperlipidemia)  03/24/2019   Acute renal failure superimposed on stage 3 chronic kidney disease 10/28/2018   Chronic kidney disease, stage 3b 10/28/2018   Rheumatoid arthritis involving multiple sites 10/28/2018   Dementia with behavioral disturbance 10/27/2018   Alzheimer's dementia with behavioral disturbance 07/31/2018   Essential hypertension 08/15/2017   Anemia, mild 02/12/2017   Olecranon bursitis of right elbow 02/12/2017   Kyphosis of cervical region 10/26/2015   Intermittent alternating esotropia 05/18/2015   GERD (gastroesophageal reflux disease) 01/22/2014   Angina pectoris 01/22/2014   PCP:  Wenda Low, MD Pharmacy:   [facility], [place] - [address] AT [facility] [address] [place] [address] Phone: [phone] Fax: [phone]     Social Determinants of Health (SDOH) Social History: SDOH Screenings   Food Insecurity: No Food Insecurity (01/16/2023)  Housing: Low Risk  (01/16/2023)  Transportation Needs: No Transportation Needs (01/02/2023)  Utilities: Not At Risk (01/02/2023)  Tobacco Use: Low Risk  (01/15/2023)   SDOH Interventions:     Readmission Risk Interventions     No data to display

## 2023-01-16 NOTE — Hospital Course (Addendum)
Devin Vance is a 89 y.o. male with a history of Alzheimer's dementia, CAD, CKD stage IIIb, COPD, rheumatoid arthritis, hypertension, hyperlipidemia, prostate cancer, OSA not on CPAP.  Patient presented secondary to abdominal pain and nausea and was found to have evidence of acute cholecystitis with associated severe sepsis.  Patient was started on empiric antibiotics.  General surgery consulted and recommended no surgical management secondary to comorbidities.  Interventional radiology consulted and performed percutaneous cholecystomy tube placement with improvement in symptoms.  Culture data without identification of an organism.  Hospitalization complicated by decreased oral intake which complicated by underlying dementia.

## 2023-01-17 DIAGNOSIS — K8000 Calculus of gallbladder with acute cholecystitis without obstruction: Secondary | ICD-10-CM | POA: Diagnosis not present

## 2023-01-17 DIAGNOSIS — E872 Acidosis, unspecified: Secondary | ICD-10-CM | POA: Diagnosis not present

## 2023-01-17 DIAGNOSIS — A419 Sepsis, unspecified organism: Secondary | ICD-10-CM | POA: Diagnosis not present

## 2023-01-17 DIAGNOSIS — R6520 Severe sepsis without septic shock: Secondary | ICD-10-CM | POA: Diagnosis not present

## 2023-01-17 LAB — CBC WITH DIFFERENTIAL/PLATELET
Abs Immature Granulocytes: 0.35 10*3/uL — ABNORMAL HIGH (ref 0.00–0.07)
Basophils Absolute: 0.1 10*3/uL (ref 0.0–0.1)
Basophils Relative: 0 %
Eosinophils Absolute: 0 10*3/uL (ref 0.0–0.5)
Eosinophils Relative: 0 %
HCT: 39.3 % (ref 39.0–52.0)
Hemoglobin: 12.6 g/dL — ABNORMAL LOW (ref 13.0–17.0)
Immature Granulocytes: 1 %
Lymphocytes Relative: 3 %
Lymphs Abs: 0.8 10*3/uL (ref 0.7–4.0)
MCH: 30.1 pg (ref 26.0–34.0)
MCHC: 32.1 g/dL (ref 30.0–36.0)
MCV: 94 fL (ref 80.0–100.0)
Monocytes Absolute: 1.2 10*3/uL — ABNORMAL HIGH (ref 0.1–1.0)
Monocytes Relative: 4 %
Neutro Abs: 27.8 10*3/uL — ABNORMAL HIGH (ref 1.7–7.7)
Neutrophils Relative %: 92 %
Platelets: 170 10*3/uL (ref 150–400)
RBC: 4.18 MIL/uL — ABNORMAL LOW (ref 4.22–5.81)
RDW: 14.5 % (ref 11.5–15.5)
WBC: 30.2 10*3/uL — ABNORMAL HIGH (ref 4.0–10.5)
nRBC: 0 % (ref 0.0–0.2)

## 2023-01-17 LAB — COMPREHENSIVE METABOLIC PANEL
ALT: 107 U/L — ABNORMAL HIGH (ref 0–44)
AST: 31 U/L (ref 15–41)
Albumin: 2.4 g/dL — ABNORMAL LOW (ref 3.5–5.0)
Alkaline Phosphatase: 95 U/L (ref 38–126)
Anion gap: 8 (ref 5–15)
BUN: 40 mg/dL — ABNORMAL HIGH (ref 8–23)
CO2: 25 mmol/L (ref 22–32)
Calcium: 8.3 mg/dL — ABNORMAL LOW (ref 8.9–10.3)
Chloride: 113 mmol/L — ABNORMAL HIGH (ref 98–111)
Creatinine, Ser: 1.62 mg/dL — ABNORMAL HIGH (ref 0.61–1.24)
GFR, Estimated: 39 mL/min — ABNORMAL LOW (ref >60)
Glucose, Bld: 140 mg/dL — ABNORMAL HIGH (ref 70–99)
Potassium: 4.2 mmol/L (ref 3.5–5.1)
Sodium: 146 mmol/L — ABNORMAL HIGH (ref 135–145)
Total Bilirubin: 1.2 mg/dL (ref 0.3–1.2)
Total Protein: 5.2 g/dL — ABNORMAL LOW (ref 6.5–8.1)

## 2023-01-17 LAB — HEMOGLOBIN A1C
Hgb A1c MFr Bld: 6.2 % — ABNORMAL HIGH (ref 4.8–5.6)
Mean Plasma Glucose: 131 mg/dL

## 2023-01-17 LAB — GLUCOSE, CAPILLARY
Glucose-Capillary: 104 mg/dL — ABNORMAL HIGH (ref 70–99)
Glucose-Capillary: 121 mg/dL — ABNORMAL HIGH (ref 70–99)
Glucose-Capillary: 121 mg/dL — ABNORMAL HIGH (ref 70–99)
Glucose-Capillary: 136 mg/dL — ABNORMAL HIGH (ref 70–99)
Glucose-Capillary: 141 mg/dL — ABNORMAL HIGH (ref 70–99)
Glucose-Capillary: 152 mg/dL — ABNORMAL HIGH (ref 70–99)

## 2023-01-17 LAB — MAGNESIUM: Magnesium: 2.5 mg/dL — ABNORMAL HIGH (ref 1.7–2.4)

## 2023-01-17 MED ORDER — MORPHINE SULFATE (PF) 2 MG/ML IV SOLN
1.0000 mg | INTRAVENOUS | Status: DC | PRN
  Administered 2023-01-17 – 2023-01-20 (×8): 1 mg via INTRAVENOUS
  Filled 2023-01-17 (×8): qty 1

## 2023-01-17 MED ORDER — DEXTROSE-NACL 5-0.45 % IV SOLN: INTRAVENOUS | Status: DC

## 2023-01-17 NOTE — Progress Notes (Signed)
[facility] Progress Note     Subjective: CC:  Resting comfortably, not verbalizing. Moans/mumbles when I talk to him.  Per wife and daughter at bedside his vitals have been better. They feel he is interacting with them about the same amount that he was before the porcedure. They have not noticed a major difference in his level of pain.   Objective: Vital signs in last 24 hours: Temp:  [97.8 F (36.6 C)-98.3 F (36.8 C)] 98 F (36.7 C) (04/04 0744) Pulse Rate:  [88-103] 93 (04/04 0744) Resp:  [18-34] 20 (04/04 0744) BP: (107-169)/(71-103) 110/71 (04/04 0744) SpO2:  [93 %-97 %] 96 % (04/04 0744) Last BM Date :  (PTA)  Intake/Output from previous day: 04/03 0701 - 04/04 0700 In: 210 [IV Piggyback:200] Out: 550 [Urine:500; Drains:50] Intake/Output this shift: No intake/output data recorded.  PE: Gen:  resting, eyes closed, NAD, moans when I speak to him loudly Card:  Regular rate and rhythm 88 bpm Pulm:  Normal effort  Abd: Soft, mild upper abdominal tenderness without guarding, drain in RUQ w/ bilious efflient (50 mL) Skin: warm and dry, no rashes  Psych: A&Ox3   Lab Results:  Recent Labs    01/16/23 0514 01/17/23 0522  WBC 37.3* 30.2*  HGB 13.8 12.6*  HCT 43.8 39.3  PLT 210 170   BMET Recent Labs    01/16/23 0514 01/17/23 0522  NA 143 146*  K 4.6 4.2  CL 109 113*  CO2 27 25  GLUCOSE 157* 140*  BUN 35* 40*  CREATININE 1.43* 1.62*  CALCIUM 8.5* 8.3*   PT/INR Recent Labs    01/15/23 2108  LABPROT 13.6  INR 1.1   CMP     Component Value Date/Time   NA 146 (H) 01/17/2023 0522   NA 144 07/15/2019 1407   NA 145 04/09/2014 1759   K 4.2 01/17/2023 0522   K 4.2 04/09/2014 1759   CL 113 (H) 01/17/2023 0522   CL 112 (H) 04/09/2014 1759   CO2 25 01/17/2023 0522   CO2 27 04/09/2014 1759   GLUCOSE 140 (H) 01/17/2023 0522   GLUCOSE 117 (H) 04/09/2014 1759   BUN 40 (H) 01/17/2023 0522   BUN 35 (H) 07/15/2019 1407   BUN 30 (H) 04/09/2014  1759   CREATININE 1.62 (H) 01/17/2023 0522   CREATININE 1.61 (H) 04/09/2014 1759   CALCIUM 8.3 (L) 01/17/2023 0522   CALCIUM 8.6 04/09/2014 1759   PROT 5.2 (L) 01/17/2023 0522   PROT 5.9 (L) 07/15/2019 1407   PROT 6.8 04/09/2014 1759   ALBUMIN 2.4 (L) 01/17/2023 0522   ALBUMIN 4.0 07/15/2019 1407   ALBUMIN 3.5 04/09/2014 1759   AST 31 01/17/2023 0522   AST 34 04/09/2014 1759   ALT 107 (H) 01/17/2023 0522   ALT 35 04/09/2014 1759   ALKPHOS 95 01/17/2023 0522   ALKPHOS 62 04/09/2014 1759   BILITOT 1.2 01/17/2023 0522   BILITOT 0.3 07/15/2019 1407   BILITOT 0.3 04/09/2014 1759   GFRNONAA 39 (L) 01/17/2023 0522   GFRNONAA 39 (L) 04/09/2014 1759   GFRAA 47 (L) 07/15/2019 1407   GFRAA 45 (L) 04/09/2014 1759   Lipase     Component Value Date/Time   LIPASE 24 01/15/2023 2108       Studies/Results: IR Perc Cholecystostomy  Result Date: 01/16/2023 CLINICAL DATA:  Acute calculus cholecystitis EXAM: PERCUTANEOUS CHOLECYSTOSTOMY TUBE PLACEMENT WITH ULTRASOUND AND FLUOROSCOPIC GUIDANCE FLUOROSCOPY: Radiation Exposure Index (as provided by the fluoroscopic device): 4 mGy air Kerma  TECHNIQUE: Survey ultrasound of the abdomen was performed and an appropriate skin entry site was identified. Skin site was marked, prepped with chlorhexidine, and draped in usual sterile fashion, and infiltrated locally with 1% lidocaine. Intravenous Fentanyl 54mcg and Versed 1mg  were administered as conscious sedation during continuous monitoring of the patient's level of consciousness and physiological / cardiorespiratory status by the radiology RN, with a total moderate sedation time of 10 minutes. Under real-time ultrasound guidance, gallbladder was accessed using a transhepatic approach with a 21-gauge needle. Ultrasound image documentation was saved. Bile returned through the hub. Needle was exchanged over a 018 guidewire for transitional dilator which allowed placement of 035 J wire. Over this, a 10.2 French  pigtail catheter was advanced and formed centrally in the gallbladder lumen. Small contrast injection confirmed appropriate position. Catheter secured externally with 0 Prolene suture and placed external drain bag. Patient tolerated the procedure well. COMPLICATIONS: COMPLICATIONS none IMPRESSION: 1. Technically successful percutaneous cholecystostomy tube placement with ultrasound and fluoroscopic guidance. Electronically Signed   By: Lucrezia Europe M.D.   On: 01/16/2023 19:09   CT Abdomen Pelvis W Contrast  Result Date: 01/15/2023 CLINICAL DATA:  Abdominal pain. EXAM: CT ABDOMEN AND PELVIS WITH CONTRAST TECHNIQUE: Multidetector CT imaging of the abdomen and pelvis was performed using the standard protocol following bolus administration of intravenous contrast. RADIATION DOSE REDUCTION: This exam was performed according to the departmental dose-optimization program which includes automated exposure control, adjustment of the mA and/or kV according to patient size and/or use of iterative reconstruction technique. CONTRAST:  72mL OMNIPAQUE IOHEXOL 300 MG/ML  SOLN COMPARISON:  CT abdomen pelvis dated 01/02/2023. FINDINGS: Lower chest: There are bibasilar subpleural atelectasis. There is coronary vascular calcification. No intra-abdominal free air. Upper abdominal and perihepatic ascites. Hepatobiliary: The liver is unremarkable. There is mild biliary dilatation versus periportal edema. The gallbladder is distended. Multiple stones noted within the gallbladder. There is a impacted appearing stone in the neck of the gallbladder. There is extensive inflammatory changes and edema surrounding the gallbladder. Findings most consistent with acute cholecystitis. Pancreas: There is mild inflammatory changes and stranding adjacent to the pancreas, likely related to inflammatory changes of the gallbladder. Acute pancreatitis is less likely. Correlation with pancreatic enzymes recommended. Spleen: Normal in size without focal  abnormality. Adrenals/Urinary Tract: The adrenal glands are unremarkable. There is no hydronephrosis on either side. There is symmetric enhancement and excretion of contrast by both kidneys. The visualized ureters and urinary bladder appear unremarkable. Stomach/Bowel: There is no bowel obstruction or active inflammation. The appendix is normal. Vascular/Lymphatic: Moderate aortoiliac atherosclerotic disease. The IVC is unremarkable. No portal venous gas. There is no adenopathy. Reproductive: Prostatectomy. Other: Midline vertical anterior pelvic wall incisional scar. Musculoskeletal: Degenerative changes of the spine. No acute osseous pathology. IMPRESSION: 1. Cholelithiasis with findings of acute cholecystitis. 2. No bowel obstruction. Normal appendix. 3.  Aortic Atherosclerosis (ICD10-I70.0). Electronically Signed   By: Anner Crete M.D.   On: 01/15/2023 22:49   DG Chest Port 1 View  Result Date: 01/15/2023 CLINICAL DATA:  Questionable sepsis-evaluate for abnormality. 4 hours of upper abdominal pain and nausea. EXAM: PORTABLE CHEST 1 VIEW COMPARISON:  Radiographs 01/02/2023 FINDINGS: Stable cardiomediastinal silhouette given patient rotation. Aortic atherosclerotic calcification. Low lung volumes accentuate pulmonary vascularity. Bibasilar/perihilar atelectasis, decreased from 01/02/2023. Gaseous distention of the stomach. No displaced rib fractures. IMPRESSION: Low lung volumes.  No active disease. Electronically Signed   By: Placido Sou M.D.   On: 01/15/2023 21:55    Anti-infectives: Anti-infectives (From admission,  onward)    Start     Dose/Rate Route Frequency Ordered Stop   01/16/23 1000  ceFEPIme (MAXIPIME) 2 g in sodium chloride 0.9 % 100 mL IVPB  Status:  Discontinued        2 g 200 mL/hr over 30 Minutes Intravenous Every 12 hours 01/15/23 2343 01/16/23 0020   01/16/23 0200  piperacillin-tazobactam (ZOSYN) IVPB 3.375 g        3.375 g 12.5 mL/hr over 240 Minutes Intravenous Every 8  hours 01/16/23 0038     01/15/23 2145  ceFEPIme (MAXIPIME) 2 g in sodium chloride 0.9 % 100 mL IVPB        2 g 200 mL/hr over 30 Minutes Intravenous  Once 01/15/23 2133 01/15/23 2309   01/15/23 2145  metroNIDAZOLE (FLAGYL) IVPB 500 mg        500 mg 100 mL/hr over 60 Minutes Intravenous  Once 01/15/23 2133 01/16/23 0052          Latest Ref Rng & Units 01/17/2023    5:22 AM 01/16/2023    5:14 AM 01/15/2023    9:08 PM  Hepatic Function  Total Protein 6.5 - 8.1 g/dL 5.2  5.9  6.8   Albumin 3.5 - 5.0 g/dL 2.4  2.9  3.5   AST 15 - 41 U/L 31  68  98   ALT 0 - 44 U/L 107  171  207   Alk Phosphatase 38 - 126 U/L 95  103  124   Total Bilirubin 0.3 - 1.2 mg/dL 1.2  1.7  2.0     Assessment/Plan Cholecystitis S/p percutaneous cholecystostomy tube placement 01/16/23 Dr. Jarvis Newcomer  - afebrile, VSS, WBC 30 from 37  - LFTs slightly improved as above - BUN/Cr 40/1.62 from 35 1.42, AKI on CKD per primary team  - no emergent surgical needs.    FEN: CLD, ok to ADAT per SLP recs  ID: Zosyn 4/2 >>, continue abx. Cx from cholecystostomy tube with no organisms.  Foley: external cath in place  VTE: SCD's, ok for chemical DVT ppx from surgery standpoint  Alzheimer's dementia HTN GERD CAD OSA CKD III  AAA RA PMH prostate CA    LOS: 2 days   I reviewed nursing notes, Consultant IR notes, hospitalist notes, last 24 h vitals and pain scores, last 48 h intake and output, last 24 h labs and trends, and last 24 h imaging results.  This care required straight-forward level of medical decision making.   Obie Dredge, PA-C [facility] Please see Amion for pager number during day hours 7:00am-4:30pm

## 2023-01-17 NOTE — Progress Notes (Signed)
Referring Physician(s): Romana Juniper, MD  Supervising Physician: Michaelle Birks  Patient Status:  [facility] - In-pt  Chief Complaint:  Acute calculous cholecystitis s/p percutaneous cholecystostomy drain placement 01/16/23  Subjective:  Patient lying in bed accompanied by son-in-law at bedside. Patient endorses no pain at drain insertion site, family member states that he thinks the drain is working without issue. No concerns from patient or family at this time.  Allergies: Lorazepam  Medications: Prior to Admission medications   Medication Sig Start Date End Date Taking? Authorizing Provider  aspirin EC 81 MG tablet Take 81 mg by mouth at bedtime.   Yes [provider]  Cholecalciferol (VITAMIN D-3 PO) Take 1,000 Units by mouth daily.   Yes [provider]  Cyanocobalamin (VITAMIN B12) 1000 MCG TBCR Take 2,500 mcg by mouth daily.   Yes [provider]  Fluticasone Propionate (FLONASE ALLERGY RELIEF NA) Place 2 sprays into both nostrils daily as needed (Rhinitis).   Yes [provider]  guaiFENesin (MUCINEX) 600 MG 12 hr tablet Take 1 tablet (600 mg total) by mouth 2 (two) times daily. 01/05/23  Yes Regalado, Belkys A, MD  hydroxychloroquine (PLAQUENIL) 200 MG tablet Take 200 mg by mouth See admin instructions. Take 200 mg (1 tablet) every other day, alternating with 400 mg (2 tablets) every other day. 06/11/19  Yes [provider]  ipratropium-albuterol (DUONEB) 0.5-2.5 (3) MG/3ML SOLN Take 3 mLs by nebulization 2 (two) times daily. 01/05/23  Yes Regalado, Belkys A, MD  Lactobacillus (PROBIOTIC ACIDOPHILUS PO) Take 1 tablet by mouth daily.   Yes [provider]  levothyroxine (SYNTHROID) 25 MCG tablet Take 25 mcg by mouth daily before breakfast. 04/29/20  Yes [provider]  memantine (NAMENDA) 10 MG tablet Take 1 tablet (10 mg total) by mouth 2 (two) times daily. 05/31/22  Yes [name], MD  metoprolol succinate  (TOPROL-XL) 25 MG 24 hr tablet Take 12.5 mg by mouth daily. 08/20/16 01/16/23 Yes [provider]  modafinil (PROVIGIL) 100 MG tablet Take 1 tablet (100 mg total) by mouth daily. 12/05/22  Yes [name], MD  Multiple Vitamin (MULTIVITAMIN) capsule Take 1 capsule by mouth daily.   Yes [provider]  pantoprazole (PROTONIX) 40 MG tablet Take 1 tablet (40 mg total) by mouth daily. 01/05/23  Yes Regalado, Belkys A, MD  QUEtiapine (SEROQUEL) 25 MG tablet Take 1 tablet (25 mg total) by mouth at bedtime. 05/31/22  Yes [name], MD     Vital Signs: BP 110/71 (BP Location: Left Arm)   Pulse 93   Temp 98.7 F (37.1 C) (Oral)   Resp 20   Ht 5\' 10"  (1.778 m)   Wt 212 lb 8.4 oz (96.4 kg)   SpO2 96%   BMI 30.49 kg/m   Physical Exam Vitals reviewed.  Constitutional:      General: He is not in acute distress.    Appearance: He is ill-appearing.  Pulmonary:     Effort: Pulmonary effort is normal.  Abdominal:     Palpations: Abdomen is soft.     Tenderness: There is no abdominal tenderness.     Comments: RUQ drain in place. Suture and stat lock in place. Dressing clean, dry, intact. Insertion site unremarkable. Scant amount of bilious output in gravity bag at time of exam, patient's family states it was emptied a few minutes before exam today.  Skin:    General: Skin is warm and dry.  Neurological:  Mental Status: Mental status is at baseline.     Imaging: IR Perc Cholecystostomy  Result Date: 01/16/2023 CLINICAL DATA:  Acute calculus cholecystitis EXAM: PERCUTANEOUS CHOLECYSTOSTOMY TUBE PLACEMENT WITH ULTRASOUND AND FLUOROSCOPIC GUIDANCE FLUOROSCOPY: Radiation Exposure Index (as provided by the fluoroscopic device): 4 mGy air Kerma TECHNIQUE: Survey ultrasound of the abdomen was performed and an appropriate skin entry site was identified. Skin site was marked, prepped with chlorhexidine, and draped in usual sterile fashion, and infiltrated locally with 1%  lidocaine. Intravenous Fentanyl 73mcg and Versed 1mg  were administered as conscious sedation during continuous monitoring of the patient's level of consciousness and physiological / cardiorespiratory status by the radiology RN, with a total moderate sedation time of 10 minutes. Under real-time ultrasound guidance, gallbladder was accessed using a transhepatic approach with a 21-gauge needle. Ultrasound image documentation was saved. Bile returned through the hub. Needle was exchanged over a 018 guidewire for transitional dilator which allowed placement of 035 J wire. Over this, a 10.2 French pigtail catheter was advanced and formed centrally in the gallbladder lumen. Small contrast injection confirmed appropriate position. Catheter secured externally with 0 Prolene suture and placed external drain bag. Patient tolerated the procedure well. COMPLICATIONS: COMPLICATIONS none IMPRESSION: 1. Technically successful percutaneous cholecystostomy tube placement with ultrasound and fluoroscopic guidance. Electronically Signed   By: Lucrezia Europe M.D.   On: 01/16/2023 19:09   CT Abdomen Pelvis W Contrast  Result Date: 01/15/2023 CLINICAL DATA:  Abdominal pain. EXAM: CT ABDOMEN AND PELVIS WITH CONTRAST TECHNIQUE: Multidetector CT imaging of the abdomen and pelvis was performed using the standard protocol following bolus administration of intravenous contrast. RADIATION DOSE REDUCTION: This exam was performed according to the departmental dose-optimization program which includes automated exposure control, adjustment of the mA and/or kV according to patient size and/or use of iterative reconstruction technique. CONTRAST:  61mL OMNIPAQUE IOHEXOL 300 MG/ML  SOLN COMPARISON:  CT abdomen pelvis dated 01/02/2023. FINDINGS: Lower chest: There are bibasilar subpleural atelectasis. There is coronary vascular calcification. No intra-abdominal free air. Upper abdominal and perihepatic ascites. Hepatobiliary: The liver is unremarkable.  There is mild biliary dilatation versus periportal edema. The gallbladder is distended. Multiple stones noted within the gallbladder. There is a impacted appearing stone in the neck of the gallbladder. There is extensive inflammatory changes and edema surrounding the gallbladder. Findings most consistent with acute cholecystitis. Pancreas: There is mild inflammatory changes and stranding adjacent to the pancreas, likely related to inflammatory changes of the gallbladder. Acute pancreatitis is less likely. Correlation with pancreatic enzymes recommended. Spleen: Normal in size without focal abnormality. Adrenals/Urinary Tract: The adrenal glands are unremarkable. There is no hydronephrosis on either side. There is symmetric enhancement and excretion of contrast by both kidneys. The visualized ureters and urinary bladder appear unremarkable. Stomach/Bowel: There is no bowel obstruction or active inflammation. The appendix is normal. Vascular/Lymphatic: Moderate aortoiliac atherosclerotic disease. The IVC is unremarkable. No portal venous gas. There is no adenopathy. Reproductive: Prostatectomy. Other: Midline vertical anterior pelvic wall incisional scar. Musculoskeletal: Degenerative changes of the spine. No acute osseous pathology. IMPRESSION: 1. Cholelithiasis with findings of acute cholecystitis. 2. No bowel obstruction. Normal appendix. 3.  Aortic Atherosclerosis (ICD10-I70.0). Electronically Signed   By: Anner Crete M.D.   On: 01/15/2023 22:49   DG Chest Port 1 View  Result Date: 01/15/2023 CLINICAL DATA:  Questionable sepsis-evaluate for abnormality. 4 hours of upper abdominal pain and nausea. EXAM: PORTABLE CHEST 1 VIEW COMPARISON:  Radiographs 01/02/2023 FINDINGS: Stable cardiomediastinal silhouette given patient rotation. Aortic atherosclerotic calcification.  Low lung volumes accentuate pulmonary vascularity. Bibasilar/perihilar atelectasis, decreased from 01/02/2023. Gaseous distention of the  stomach. No displaced rib fractures. IMPRESSION: Low lung volumes.  No active disease. Electronically Signed   By: Placido Sou M.D.   On: 01/15/2023 21:55    Labs:  CBC: Recent Labs    01/03/23 0441 01/15/23 2108 01/16/23 0514 01/17/23 0522  WBC 8.1 41.0* 37.3* 30.2*  HGB 11.6* 15.0 13.8 12.6*  HCT 35.6* 46.5 43.8 39.3  PLT 195 290 210 170    COAGS: Recent Labs    01/02/23 0256 01/15/23 2108  INR 1.1 1.1  APTT 28 26    BMP: Recent Labs    01/04/23 0901 01/15/23 2108 01/16/23 0514 01/17/23 0522  NA 137 145 143 146*  K 3.9 4.9 4.6 4.2  CL 105 110 109 113*  CO2 21* 22 27 25   GLUCOSE 119* 248* 157* 140*  BUN 34* 40* 35* 40*  CALCIUM 8.5* 9.1 8.5* 8.3*  CREATININE 1.38* 1.58* 1.43* 1.62*  GFRNONAA 48* 41* 46* 39*    LIVER FUNCTION TESTS: Recent Labs    01/04/23 0901 01/15/23 2108 01/16/23 0514 01/17/23 0522  BILITOT 0.6 2.0* 1.7* 1.2  AST 31 98* 68* 31  ALT 48* 207* 171* 107*  ALKPHOS 70 124 103 95  PROT 6.2* 6.8 5.9* 5.2*  ALBUMIN 3.3* 3.5 2.9* 2.4*    Assessment and Plan:  Acute calculous cholecystitis s/p percutaneous cholecystostomy drain placement 01/16/23  -WBC 30.2 today (37.3), Tbili 1.2 (1.7). Fluid culture still pending with no growth to date -Patient is tolerating drain well, family at bedside states he seems somewhat better today -50 mL of output documented in chart following placement yesterday  Drain Location: RUQ Size: Fr size: 10 Fr Date of placement: 01/16/23  Currently to: Drain collection device: gravity 24 hour output:  Output by Drain (mL) 01/15/23 0701 - 01/15/23 1900 01/15/23 1901 - 01/16/23 0700 01/16/23 0701 - 01/16/23 1900 01/16/23 1901 - 01/17/23 0700 01/17/23 0701 - 01/17/23 1436  Biliary Tube Cook slip-coat 10.2 Fr. RUQ   20 30     Interval imaging/drain manipulation:  None  Current examination: Flushes/aspirates easily.  Insertion site unremarkable. Suture and stat lock in place. Dressed appropriately.    Plan: Continue TID flushes with 5 cc NS. Record output Q shift. Dressing changes QD or PRN if soiled.  Call IR APP or on call IR MD if difficulty flushing or sudden change in drain output.  Repeat imaging/possible drain injection once output < 10 mL/QD (excluding flush material). Consideration for drain removal if output is < 10 mL/QD (excluding flush material), pending discussion with the providing surgical service.  Discharge planning: Please contact IR APP or on call IR MD prior to patient d/c to ensure appropriate follow up plans are in place. Typically patient will follow up with IR clinic 10-14 days post d/c for repeat imaging/possible drain injection. IR scheduler will contact patient with date/time of appointment. Patient will need to flush drain QD with 5 cc NS, record output QD, dressing changes every 2-3 days or earlier if soiled.   IR will continue to follow - please call with questions or concerns.   Electronically Signed: Lura Em, PA-C 01/17/2023, 2:22 PM   I spent a total of 15 Minutes at the the patient's bedside AND on the patient's hospital floor or unit, greater than 50% of which was counseling/coordinating care for acute calculous cholecystitis s/p percutaneous cholecystostomy placement 01/16/23.

## 2023-01-17 NOTE — Consult Note (Signed)
Consultation Note Date: 01/17/2023   Patient Name: Devin Vance  DOB: 1927/11/11  MRN: [id number]  Age / Sex: 89 y.o., male  PCP: Wenda Low, MD Referring Physician: Aline August, MD  Reason for Consultation: Establishing goals of care  HPI/Patient Profile: 89 y.o. male    admitted on 01/15/2023    Clinical Assessment and Goals of Care: Mr. 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 He has a history of Alzheimer's dementia for the past 5 to 6 years.  He lives at [facility] independent living facility.  He has a recent hospitalization for COVID.  He has been admitted to hospital medicine service with the surgical colleagues and vaginal radiology colleagues following for cholecystitis for which he underwent percutaneous cholecystostomy tube placement on 01-16-2023.  Hospital course complicated by altered mental status and patient not awake alert enough to have a meaningful oral intake of clear liquids which has been prescribed. Palliative consult for ongoing goals of care discussions has been requested. Chart reviewed, patient seen and examined, discussed with family present at bedside.  Patient has past medical history of Alzheimer's dementia obstructive sleep apnea stage III chronic kidney disease hypertension GERD coronary artery disease and history of prostate cancer. He has a had the cholecystostomy tube placement, he has had gradual progressive functional and cognitive decline from a dementia standpoint even prior to this gallbladder episode and prior to recent [facility] hospitalization as well.  Generally at baseline, he is wheelchair-bound and only gives one-word answers.   NEXT OF KIN  Wife daughter son-in-law.   SUMMARY OF RECOMMENDATIONS   Discussed with family present at bedside. Plan: Agree with DNR, monitor hospital course and  overall disease trajectory of illness.  Time trial of current interventions for the next 24-48 hours.  If the patient has ongoing decline in mental status and no meaningful oral intake, then, at that time, palliative service will recommend moving the care towards more of a comfort-focused pathway and seeking hospice support.  Alternatively, should the patient to be more awake and begin to tolerate clear liquids, then will recommend palliative services going forward. Thank you for the consult.  Code Status/Advance Care Planning: DNR   Symptom Management:     Palliative Prophylaxis:  Frequent Pain Assessment    Psycho-social/Spiritual:  Desire for further Chaplaincy support:yes Additional Recommendations: Caregiving  Support/Resources  Prognosis:  Unable to determine  Discharge Planning: To Be Determined      Primary Diagnoses: Present on Admission:  Severe sepsis with lactic acidosis  Rheumatoid arthritis involving multiple sites   I have reviewed the medical record, interviewed the patient and family, and examined the patient. The following aspects are pertinent.  Past Medical History:  Diagnosis Date   Alzheimer disease 07/31/2018   Angina pectoris    Arthritis    Cardiomyopathy    COPD (chronic obstructive pulmonary disease)    Gastroesophageal reflux disease    History of colon polyps  Hyperlipidemia    Hypertension    Obesity    Osteoporosis    Polymyalgia rheumatica    Prostate cancer    Rheumatoid arthritis    Secondary Parkinson disease 08/13/2019   Sleep apnea    Social History   Socioeconomic History   Marital status: Married    Spouse name: Not on file   Number of children: Not on file   Years of education: Not on file   Highest education level: Doctorate  Occupational History   Not on file  Tobacco Use   Smoking status: Never   Smokeless tobacco: Never  Substance and Sexual Activity   Alcohol use: Not Currently   Drug use: Not Currently    Sexual activity: Not on file  Other Topics Concern   Not on file  Social History Narrative   Lives at [facility] with his wife in independent living   Social Determinants of Health   Financial Resource Strain: Not on file  Food Insecurity: No Food Insecurity (01/16/2023)   Hunger Vital Sign    Worried About Running Out of Food in the Last Year: Never true    Ran Out of Food in the Last Year: Never true  Transportation Needs: No Transportation Needs (01/02/2023)   PRAPARE - Hydrologist (Medical): No    Lack of Transportation (Non-Medical): No  Physical Activity: Not on file  Stress: Not on file  Social Connections: Not on file   Family History  Problem Relation Age of Onset   Stroke Mother    Dementia Mother    Cancer Father    Dementia Sister    Dementia Maternal Aunt    Scheduled Meds:  insulin aspart  0-6 Units Subcutaneous Q4H   sodium chloride flush  3 mL Intravenous Q12H   sodium chloride flush  5 mL Intracatheter Q8H   Continuous Infusions:  dextrose 5 % and 0.45% NaCl 75 mL/hr at 01/17/23 0819   piperacillin-tazobactam (ZOSYN)  IV 3.375 g (01/17/23 1032)   PRN Meds:.acetaminophen **OR** acetaminophen, morphine injection, ondansetron **OR** ondansetron (ZOFRAN) IV Medications Prior to Admission:  Prior to Admission medications   Medication Sig Start Date End Date Taking? Authorizing Provider  aspirin EC 81 MG tablet Take 81 mg by mouth at bedtime.   Yes [provider]  Cholecalciferol (VITAMIN D-3 PO) Take 1,000 Units by mouth daily.   Yes [provider]  Cyanocobalamin (VITAMIN B12) 1000 MCG TBCR Take 2,500 mcg by mouth daily.   Yes [provider]  Fluticasone Propionate (FLONASE ALLERGY RELIEF NA) Place 2 sprays into both nostrils daily as needed (Rhinitis).   Yes [provider]  guaiFENesin (MUCINEX) 600 MG 12 hr tablet Take 1 tablet (600 mg total) by mouth 2 (two) times daily. 01/05/23  Yes  Regalado, Belkys A, MD  hydroxychloroquine (PLAQUENIL) 200 MG tablet Take 200 mg by mouth See admin instructions. Take 200 mg (1 tablet) every other day, alternating with 400 mg (2 tablets) every other day. 06/11/19  Yes [provider]  ipratropium-albuterol (DUONEB) 0.5-2.5 (3) MG/3ML SOLN Take 3 mLs by nebulization 2 (two) times daily. 01/05/23  Yes Regalado, Belkys A, MD  Lactobacillus (PROBIOTIC ACIDOPHILUS PO) Take 1 tablet by mouth daily.   Yes [provider]  levothyroxine (SYNTHROID) 25 MCG tablet Take 25 mcg by mouth daily before breakfast. 04/29/20  Yes [provider]  memantine (NAMENDA) 10 MG tablet Take 1 tablet (10 mg total) by mouth 2 (two) times  daily. 05/31/22  Yes [name], MD  metoprolol succinate (TOPROL-XL) 25 MG 24 hr tablet Take 12.5 mg by mouth daily. 08/20/16 01/16/23 Yes [provider]  modafinil (PROVIGIL) 100 MG tablet Take 1 tablet (100 mg total) by mouth daily. 12/05/22  Yes [name], MD  Multiple Vitamin (MULTIVITAMIN) capsule Take 1 capsule by mouth daily.   Yes [provider]  pantoprazole (PROTONIX) 40 MG tablet Take 1 tablet (40 mg total) by mouth daily. 01/05/23  Yes Regalado, Belkys A, MD  QUEtiapine (SEROQUEL) 25 MG tablet Take 1 tablet (25 mg total) by mouth at bedtime. 05/31/22  Yes [name], MD   Allergies  Allergen Reactions   Lorazepam Other (See Comments)    Generalized weakness   Review of Systems Non verbal  Physical Exam Early gentleman who is nonverbal, this is essentially his baseline He is able to move his extremities spontaneously Abdominal drain Regular work of breathing Does not appear to be in distress No edema  Vital Signs: BP 110/71 (BP Location: Left Arm)   Pulse 93   Temp 98.7 F (37.1 C) (Oral)   Resp 20   Ht 5\' 10"  (1.778 m)   Wt 96.4 kg   SpO2 96%   BMI 30.49 kg/m  Pain Scale: PAINAD   Pain Score: 0-No pain   SpO2: SpO2: 96 % O2 Device:SpO2: 96 % O2  Flow Rate: .O2 Flow Rate (L/min): 2 L/min  IO: Intake/output summary:  Intake/Output Summary (Last 24 hours) at 01/17/2023 1525 Last data filed at 01/17/2023 1300 Gross per 24 hour  Intake 215 ml  Output 530 ml  Net -315 ml    LBM: Last BM Date :  (unable to identify due to forgetfulness) Baseline Weight: Weight: 90.7 kg Most recent weight: Weight: 96.4 kg     Palliative Assessment/Data:   PPS 50%  Time In:  12 Time Out:  1300 Time Total:  60  Greater than 50%  of this time was spent counseling and coordinating care related to the above assessment and plan.  Signed by: Loistine Chance, MD   Please contact Palliative Medicine Team phone at [phone] for questions and concerns.  For individual provider: See Shea Evans

## 2023-01-17 NOTE — Evaluation (Signed)
SLP Cancellation Note  Patient Details Name: Devin Vance MRN: [id number] DOB: [birth year]   Cancelled treatment:       Reason Eval/Treat Not Completed: Other (comment);Fatigue/lethargy limiting ability to participate (son in law [name]) reports pt having increased delay in swallow recently, low voice, doesn't talk much, reports he has been lethargic since and thus has not po intake; consumed chopped foods prior to admit, will continue efforts)  Progressive decline- Used walker prior to admit and wheelchair for anything longer than 10 feet prior to admit.    [name], MS [facility] SLP Acute Rehab Services Office [phone]  [name] 01/17/2023, 12:41 PM

## 2023-01-17 NOTE — Progress Notes (Signed)
PROGRESS NOTE    Devin Vance  [id number] DOB: [birth year] DOA: 01/15/2023 PCP: Wenda Low, MD   Brief Narrative:  89 y.o. male with medical history significant for Alzheimer's dementia, CAD, CKD stage IIIb, COPD, rheumatoid arthritis, HTN, HLD, prostate cancer, OSA not using CPAP, recent hospitalization from 01/02/2023-12/07/2022 with COVID-19 infection treated with Paxlovid and oral steroids presented with worsening abdominal pain and nausea along with fever.  On presentation, WBC was 41, creatinine 1.58, AST 98, ALT 207, alkaline phosphatase 124, total bili of 2, lipase 24, lactic acid 3.4.  COVID-19 PCR positive.  Chest x-ray showed low lung volumes without focal consolidation, edema or effusion.  CT of abdomen/pelvis with contrast showed cholelithiasis with acute cholecystitis.  General surgery was consulted.  He was started on IV fluids and antibiotics.  Assessment & Plan:   Severe sepsis: Present on admission Acute calculus cholecystitis Elevated LFTs Lactic acidosis -Presented with significant leukocytosis, tachycardia, tachypnea, lactic acidosis and CT findings consistent with acute calculus cholecystitis -Continue Zosyn.  General surgery following.  Diet advancement as per general surgery.  Status post IR guided percutaneous cholecystostomy tube placement on 01/16/2023.  Continue pain management and IV fluids.  Follow cultures. -Monitor LFTs  Leukocytosis -Slightly improving but still significant at 30.2  CKD stage IIIb -monitor creatinine.  Creatinine close to baseline  COVID-19 positive -Initially positive 01/02/2023 and treated with Paxlovid. This is a persistent positive and clinically nonsignificant.   CAD -Stable.  Aspirin on hold  Hypertension -Monitor blood pressure.  Antihypertensives on hold for now  Alzheimer's dementia -Namenda, Provigil, Seroquel on hold for now  Rheumatoid arthritis -Plaquenil on hold for now  Hypothyroidism--resume Synthroid when  taking orals  OSA -Does not use CPAP  Goals of care -Overall prognosis is guarded to poor.  Consult palliative care for goals of care discussion.  DVT prophylaxis: SCDs Code Status: DNR Family Communication: Daughter, son-in-law and wife at bedside Disposition Plan: Status is: Inpatient Remains inpatient appropriate because: Of severity of illness  Consultants: General surgery.  IR.  Palliative care  Procedures: As above  Antimicrobials: Zosyn from 01/15/23 onwards   Subjective: Patient seen and examined at bedside.  No agitation, seizures, vomiting or fever reported.  Patient has not woken up much as per family members at bedside. Objective: Vitals:   01/17/23 0400 01/17/23 0424 01/17/23 0548 01/17/23 0604  BP: 107/85  130/75 110/71  Pulse: 96  95 97  Resp: 19  20 20   Temp:  98 F (36.7 C)    TempSrc:      SpO2: 97%  96% 96%  Weight:      Height:        Intake/Output Summary (Last 24 hours) at 01/17/2023 0740 Last data filed at 01/17/2023 0500 Gross per 24 hour  Intake 210 ml  Output 550 ml  Net -340 ml    Filed Weights   01/15/23 2100 01/16/23 0434  Weight: 90.7 kg 96.4 kg    Examination:  General: On 2 L oxygen by nasal cannula intermittently.  No distress.  Looks chronically ill and deconditioned.  Elderly male lying in bed. ENT/neck: No thyromegaly.  JVD is not elevated  respiratory: Decreased breath sounds at bases bilaterally with some crackles and intermittent tachypnea; no wheezing  CVS: S1-S2 heard, rate controlled  Abdominal: Soft, tender in the right upper quadrant, slightly distended; no organomegaly, bowel sounds are heard Extremities: Trace lower extremity edema; no cyanosis  CNS: Wakes up only very slightly; confused.  No focal neurologic deficit.  Moves extremities Lymph: No obvious lymphadenopathy Skin: No obvious ecchymosis/lesions  psych: Flat affect.  Not agitated. musculoskeletal: No obvious joint swelling/deformity     Data  Reviewed: I have personally reviewed following labs and imaging studies  CBC: Recent Labs  Lab 01/15/23 2108 01/16/23 0514 01/17/23 0522  WBC 41.0* 37.3* 30.2*  NEUTROABS 38.8*  --  27.8*  HGB 15.0 13.8 12.6*  HCT 46.5 43.8 39.3  MCV 93.0 92.8 94.0  PLT 290 210 [id number]    Basic Metabolic Panel: Recent Labs  Lab 01/15/23 2108 01/16/23 0514 01/17/23 0522  NA 145 143 146*  K 4.9 4.6 4.2  CL 110 109 113*  CO2 22 27 25   GLUCOSE 248* 157* 140*  BUN 40* 35* 40*  CREATININE 1.58* 1.43* 1.62*  CALCIUM 9.1 8.5* 8.3*  MG  --   --  2.5*    GFR: Estimated Creatinine Clearance: 33.2 mL/min (A) (by C-G formula based on SCr of 1.62 mg/dL (H)). Liver Function Tests: Recent Labs  Lab 01/15/23 2108 01/16/23 0514 01/17/23 0522  AST 98* 68* 31  ALT 207* 171* 107*  ALKPHOS 124 103 95  BILITOT 2.0* 1.7* 1.2  PROT 6.8 5.9* 5.2*  ALBUMIN 3.5 2.9* 2.4*    Recent Labs  Lab 01/15/23 2108  LIPASE 24    No results for input(s): "AMMONIA" in the last 168 hours. Coagulation Profile: Recent Labs  Lab 01/15/23 2108  INR 1.1    Cardiac Enzymes: No results for input(s): "CKTOTAL", "CKMB", "CKMBINDEX", "TROPONINI" in the last 168 hours. BNP (last 3 results) No results for input(s): "PROBNP" in the last 8760 hours. HbA1C: Recent Labs    01/16/23 0514  HGBA1C 6.2*   CBG: Recent Labs  Lab 01/16/23 1215 01/16/23 1639 01/16/23 2015 01/17/23 0018 01/17/23 0423  GLUCAP 155* 157* 111* 104* 121*    Lipid Profile: No results for input(s): "CHOL", "HDL", "LDLCALC", "TRIG", "CHOLHDL", "LDLDIRECT" in the last 72 hours. Thyroid Function Tests: No results for input(s): "TSH", "T4TOTAL", "FREET4", "T3FREE", "THYROIDAB" in the last 72 hours. Anemia Panel: No results for input(s): "VITAMINB12", "FOLATE", "FERRITIN", "TIBC", "IRON", "RETICCTPCT" in the last 72 hours. Sepsis Labs: Recent Labs  Lab 01/15/23 2108 01/15/23 2353 01/16/23 0514  PROCALCITON  --   --  1.49  LATICACIDVEN  3.4* 4.8* 2.0*     Recent Results (from the past 240 hour(s))  Blood Culture (routine x 2)     Status: None (Preliminary result)   Collection Time: 01/15/23  9:55 PM   Specimen: BLOOD  Result Value Ref Range Status   Specimen Description   Final    BLOOD BLOOD RIGHT FOREARM Performed at [facility] [address], [place], [place] [address]    Special Requests   Final    BOTTLES DRAWN AEROBIC AND ANAEROBIC Blood Culture adequate volume Performed at [facility] [address], [place], [place] [address]    Culture   Final    NO GROWTH < 12 HOURS Performed at [facility] [address] [place], [place] [address]    Report Status PENDING  Incomplete  Blood Culture (routine x 2)     Status: None (Preliminary result)   Collection Time: 01/15/23 10:00 PM   Specimen: BLOOD  Result Value Ref Range Status   Specimen Description   Final    BLOOD RIGHT ANTECUBITAL Performed at [facility] [address] [place], [place] [address]    Special Requests   Final  BOTTLES DRAWN AEROBIC AND ANAEROBIC Blood Culture adequate volume Performed at [facility] [address], [place], [place] [address]    Culture   Final    NO GROWTH < 12 HOURS Performed at [facility] [address], [place], [place] [address]    Report Status PENDING  Incomplete  Resp panel by RT-PCR (RSV, Flu A&B, Covid) Anterior Nasal Swab     Status: Abnormal   Collection Time: 01/15/23 10:17 PM   Specimen: Anterior Nasal Swab  Result Value Ref Range Status   SARS Coronavirus 2 by RT PCR POSITIVE (A) NEGATIVE Final    Comment: (NOTE) SARS-CoV-2 target nucleic acids are DETECTED.  The SARS-CoV-2 RNA is generally detectable in upper respiratory specimens during the acute phase of infection. Positive results are indicative of the presence of the identified virus, but do not rule out bacterial infection or co-infection  with other pathogens not detected by the test. Clinical correlation with patient history and other diagnostic information is necessary to determine patient infection status. The expected result is Negative.  Fact Sheet for Patients: EntrepreneurPulse.com.au  Fact Sheet for Healthcare Providers: IncredibleEmployment.be  This test is not yet approved or cleared by the [place] FDA and  has been authorized for detection and/or diagnosis of SARS-CoV-2 by FDA under an Emergency Use Authorization (EUA).  This EUA will remain in effect (meaning this test can be used) for the duration of  the COVID-19 declaration under Section 564(b)(1) of the A ct, 21 U.S.C. section 360bbb-3(b)(1), unless the authorization is terminated or revoked sooner.     Influenza A by PCR NEGATIVE NEGATIVE Final   Influenza B by PCR NEGATIVE NEGATIVE Final    Comment: (NOTE) The Xpert Xpress SARS-CoV-2/FLU/RSV plus assay is intended as an aid in the diagnosis of influenza from Nasopharyngeal swab specimens and should not be used as a sole basis for treatment. Nasal washings and aspirates are unacceptable for Xpert Xpress SARS-CoV-2/FLU/RSV testing.  Fact Sheet for Patients: EntrepreneurPulse.com.au  Fact Sheet for Healthcare Providers: IncredibleEmployment.be  This test is not yet approved or cleared by the [place] FDA and has been authorized for detection and/or diagnosis of SARS-CoV-2 by FDA under an Emergency Use Authorization (EUA). This EUA will remain in effect (meaning this test can be used) for the duration of the COVID-19 declaration under Section 564(b)(1) of the Act, 21 U.S.C. section 360bbb-3(b)(1), unless the authorization is terminated or revoked.     Resp Syncytial Virus by PCR NEGATIVE NEGATIVE Final    Comment: (NOTE) Fact Sheet for Patients: EntrepreneurPulse.com.au  Fact Sheet for  Healthcare Providers: IncredibleEmployment.be  This test is not yet approved or cleared by the [place] FDA and has been authorized for detection and/or diagnosis of SARS-CoV-2 by FDA under an Emergency Use Authorization (EUA). This EUA will remain in effect (meaning this test can be used) for the duration of the COVID-19 declaration under Section 564(b)(1) of the Act, 21 U.S.C. section 360bbb-3(b)(1), unless the authorization is terminated or revoked.  Performed at [facility], [place] [address] [place], [place] [address]   Aerobic/Anaerobic Culture w Gram Stain (surgical/deep wound)     Status: None (Preliminary result)   Collection Time: 01/16/23  3:12 PM   Specimen: BILE  Result Value Ref Range Status   Specimen Description   Final    BILE Performed at [facility] [address] [place], [place] [address]    Special Requests   Final  NONE Performed at [facility], [place] [address] [place], [place] [address]    Gram Stain   Final    NO WBC SEEN NO ORGANISMS SEEN Performed at [facility], [place] [address], [place], [place] [address]    Culture PENDING  Incomplete   Report Status PENDING  Incomplete         Radiology Studies: IR Perc Cholecystostomy  Result Date: 01/16/2023 CLINICAL DATA:  Acute calculus cholecystitis EXAM: PERCUTANEOUS CHOLECYSTOSTOMY TUBE PLACEMENT WITH ULTRASOUND AND FLUOROSCOPIC GUIDANCE FLUOROSCOPY: Radiation Exposure Index (as provided by the fluoroscopic device): 4 mGy air Kerma TECHNIQUE: Survey ultrasound of the abdomen was performed and an appropriate skin entry site was identified. Skin site was marked, prepped with chlorhexidine, and draped in usual sterile fashion, and infiltrated locally with 1% lidocaine. Intravenous Fentanyl 18mcg and Versed 1mg  were administered as conscious sedation during continuous monitoring of the patient's level of consciousness  and physiological / cardiorespiratory status by the radiology RN, with a total moderate sedation time of 10 minutes. Under real-time ultrasound guidance, gallbladder was accessed using a transhepatic approach with a 21-gauge needle. Ultrasound image documentation was saved. Bile returned through the hub. Needle was exchanged over a 018 guidewire for transitional dilator which allowed placement of 035 J wire. Over this, a 10.2 French pigtail catheter was advanced and formed centrally in the gallbladder lumen. Small contrast injection confirmed appropriate position. Catheter secured externally with 0 Prolene suture and placed external drain bag. Patient tolerated the procedure well. COMPLICATIONS: COMPLICATIONS none IMPRESSION: 1. Technically successful percutaneous cholecystostomy tube placement with ultrasound and fluoroscopic guidance. Electronically Signed   By: Lucrezia Europe M.D.   On: 01/16/2023 19:09   CT Abdomen Pelvis W Contrast  Result Date: 01/15/2023 CLINICAL DATA:  Abdominal pain. EXAM: CT ABDOMEN AND PELVIS WITH CONTRAST TECHNIQUE: Multidetector CT imaging of the abdomen and pelvis was performed using the standard protocol following bolus administration of intravenous contrast. RADIATION DOSE REDUCTION: This exam was performed according to the departmental dose-optimization program which includes automated exposure control, adjustment of the mA and/or kV according to patient size and/or use of iterative reconstruction technique. CONTRAST:  49mL OMNIPAQUE IOHEXOL 300 MG/ML  SOLN COMPARISON:  CT abdomen pelvis dated 01/02/2023. FINDINGS: Lower chest: There are bibasilar subpleural atelectasis. There is coronary vascular calcification. No intra-abdominal free air. Upper abdominal and perihepatic ascites. Hepatobiliary: The liver is unremarkable. There is mild biliary dilatation versus periportal edema. The gallbladder is distended. Multiple stones noted within the gallbladder. There is a impacted appearing  stone in the neck of the gallbladder. There is extensive inflammatory changes and edema surrounding the gallbladder. Findings most consistent with acute cholecystitis. Pancreas: There is mild inflammatory changes and stranding adjacent to the pancreas, likely related to inflammatory changes of the gallbladder. Acute pancreatitis is less likely. Correlation with pancreatic enzymes recommended. Spleen: Normal in size without focal abnormality. Adrenals/Urinary Tract: The adrenal glands are unremarkable. There is no hydronephrosis on either side. There is symmetric enhancement and excretion of contrast by both kidneys. The visualized ureters and urinary bladder appear unremarkable. Stomach/Bowel: There is no bowel obstruction or active inflammation. The appendix is normal. Vascular/Lymphatic: Moderate aortoiliac atherosclerotic disease. The IVC is unremarkable. No portal venous gas. There is no adenopathy. Reproductive: Prostatectomy. Other: Midline vertical anterior pelvic wall incisional scar. Musculoskeletal: Degenerative changes of the spine. No acute osseous pathology. IMPRESSION: 1. Cholelithiasis with findings of acute cholecystitis. 2. No bowel obstruction. Normal appendix. 3.  Aortic Atherosclerosis (ICD10-I70.0). Electronically Signed  By: Anner Crete M.D.   On: 01/15/2023 22:49   DG Chest Port 1 View  Result Date: 01/15/2023 CLINICAL DATA:  Questionable sepsis-evaluate for abnormality. 4 hours of upper abdominal pain and nausea. EXAM: PORTABLE CHEST 1 VIEW COMPARISON:  Radiographs 01/02/2023 FINDINGS: Stable cardiomediastinal silhouette given patient rotation. Aortic atherosclerotic calcification. Low lung volumes accentuate pulmonary vascularity. Bibasilar/perihilar atelectasis, decreased from 01/02/2023. Gaseous distention of the stomach. No displaced rib fractures. IMPRESSION: Low lung volumes.  No active disease. Electronically Signed   By: Placido Sou M.D.   On: 01/15/2023 21:55         Scheduled Meds:  insulin aspart  0-6 Units Subcutaneous Q4H   sodium chloride flush  3 mL Intravenous Q12H   sodium chloride flush  5 mL Intracatheter Q8H   Continuous Infusions:  piperacillin-tazobactam (ZOSYN)  IV 3.375 g (01/17/23 0129)          Aline August, MD [facility] 01/17/2023, 7:40 AM

## 2023-01-18 ENCOUNTER — Other Ambulatory Visit: Payer: Self-pay | Admitting: Radiology

## 2023-01-18 DIAGNOSIS — E872 Acidosis, unspecified: Secondary | ICD-10-CM | POA: Diagnosis not present

## 2023-01-18 DIAGNOSIS — K8000 Calculus of gallbladder with acute cholecystitis without obstruction: Secondary | ICD-10-CM | POA: Diagnosis not present

## 2023-01-18 DIAGNOSIS — A419 Sepsis, unspecified organism: Secondary | ICD-10-CM | POA: Diagnosis not present

## 2023-01-18 DIAGNOSIS — R6520 Severe sepsis without septic shock: Secondary | ICD-10-CM | POA: Diagnosis not present

## 2023-01-18 LAB — CBC WITH DIFFERENTIAL/PLATELET
Abs Immature Granulocytes: 0.16 10*3/uL — ABNORMAL HIGH (ref 0.00–0.07)
Basophils Absolute: 0 10*3/uL (ref 0.0–0.1)
Basophils Relative: 0 %
Eosinophils Absolute: 0 10*3/uL (ref 0.0–0.5)
Eosinophils Relative: 0 %
HCT: 35.9 % — ABNORMAL LOW (ref 39.0–52.0)
Hemoglobin: 11.2 g/dL — ABNORMAL LOW (ref 13.0–17.0)
Immature Granulocytes: 1 %
Lymphocytes Relative: 6 %
Lymphs Abs: 1 10*3/uL (ref 0.7–4.0)
MCH: 30.2 pg (ref 26.0–34.0)
MCHC: 31.2 g/dL (ref 30.0–36.0)
MCV: 96.8 fL (ref 80.0–100.0)
Monocytes Absolute: 0.6 10*3/uL (ref 0.1–1.0)
Monocytes Relative: 3 %
Neutro Abs: 15 10*3/uL — ABNORMAL HIGH (ref 1.7–7.7)
Neutrophils Relative %: 90 %
Platelets: 129 10*3/uL — ABNORMAL LOW (ref 150–400)
RBC: 3.71 MIL/uL — ABNORMAL LOW (ref 4.22–5.81)
RDW: 14.4 % (ref 11.5–15.5)
WBC: 16.8 10*3/uL — ABNORMAL HIGH (ref 4.0–10.5)
nRBC: 0 % (ref 0.0–0.2)

## 2023-01-18 LAB — COMPREHENSIVE METABOLIC PANEL
ALT: 78 U/L — ABNORMAL HIGH (ref 0–44)
AST: 16 U/L (ref 15–41)
Albumin: 2.2 g/dL — ABNORMAL LOW (ref 3.5–5.0)
Alkaline Phosphatase: 78 U/L (ref 38–126)
Anion gap: 6 (ref 5–15)
BUN: 37 mg/dL — ABNORMAL HIGH (ref 8–23)
CO2: 27 mmol/L (ref 22–32)
Calcium: 8.1 mg/dL — ABNORMAL LOW (ref 8.9–10.3)
Chloride: 111 mmol/L (ref 98–111)
Creatinine, Ser: 1.68 mg/dL — ABNORMAL HIGH (ref 0.61–1.24)
GFR, Estimated: 38 mL/min — ABNORMAL LOW (ref >60)
Glucose, Bld: 118 mg/dL — ABNORMAL HIGH (ref 70–99)
Potassium: 3.7 mmol/L (ref 3.5–5.1)
Sodium: 144 mmol/L (ref 135–145)
Total Bilirubin: 0.9 mg/dL (ref 0.3–1.2)
Total Protein: 5.2 g/dL — ABNORMAL LOW (ref 6.5–8.1)

## 2023-01-18 LAB — GLUCOSE, CAPILLARY
Glucose-Capillary: 122 mg/dL — ABNORMAL HIGH (ref 70–99)
Glucose-Capillary: 112 mg/dL — ABNORMAL HIGH (ref 70–99)
Glucose-Capillary: 115 mg/dL — ABNORMAL HIGH (ref 70–99)
Glucose-Capillary: 145 mg/dL — ABNORMAL HIGH (ref 70–99)
Glucose-Capillary: 151 mg/dL — ABNORMAL HIGH (ref 70–99)
Glucose-Capillary: 124 mg/dL — ABNORMAL HIGH (ref 70–99)

## 2023-01-18 LAB — MAGNESIUM: Magnesium: 2.3 mg/dL (ref 1.7–2.4)

## 2023-01-18 MED ORDER — NORMAL SALINE FLUSH 0.9 % IV SOLN: 10.0000 mL | Freq: Every day | INTRAVENOUS | 0 refills | Status: AC

## 2023-01-18 MED ORDER — IPRATROPIUM-ALBUTEROL 0.5-2.5 (3) MG/3ML IN SOLN: 3.0000 mL | Freq: Four times a day (QID) | RESPIRATORY_TRACT | Status: DC | PRN

## 2023-01-18 NOTE — Progress Notes (Signed)
PROGRESS NOTE    Devin Vance  ZOX:[id number] DOB: 1927-11-28 DOA: 01/15/2023 PCP: Georgann Housekeeper, MD   Brief Narrative:  89 y.o. male with medical history significant for Alzheimer's dementia, CAD, CKD stage IIIb, COPD, rheumatoid arthritis, HTN, HLD, prostate cancer, OSA not using CPAP, recent hospitalization from 01/02/2023-12/07/2022 with COVID-19 infection treated with Paxlovid and oral steroids presented with worsening abdominal pain and nausea along with fever.  On presentation, WBC was 41, creatinine 1.58, AST 98, ALT 207, alkaline phosphatase 124, total bili of 2, lipase 24, lactic acid 3.4.  COVID-19 PCR positive.  Chest x-ray showed low lung volumes without focal consolidation, edema or effusion.  CT of abdomen/pelvis with contrast showed cholelithiasis with acute cholecystitis.  General surgery was consulted.  He was started on IV fluids and antibiotics.  He underwent percutaneous cholecystostomy tube placement by IR on 01/16/2023.  Assessment & Plan:   Severe sepsis: Present on admission Acute calculus cholecystitis Elevated LFTs Lactic acidosis -Presented with significant leukocytosis, tachycardia, tachypnea, lactic acidosis and CT findings consistent with acute calculus cholecystitis -Continue Zosyn.  General surgery following.  Diet advancement as per general surgery.  Status post IR guided percutaneous cholecystostomy tube placement on 01/16/2023.  Continue pain management and IV fluids.  Oral intake is still poor.  Cultures negative so far -Monitor LFTs: Improving  Leukocytosis -Improving, 16.8 today  CKD stage IIIb -monitor creatinine.  Creatinine close to baseline  COVID-19 positive -Initially positive 01/02/2023 and treated with Paxlovid. This is a persistent positive and clinically nonsignificant.   CAD -Stable.  Aspirin on hold  Thrombocytopenia -No signs of bleeding.  Monitor.  Hypertension -Monitor blood pressure.  Antihypertensives on hold for now.  Blood  pressures intermittently elevated.  Alzheimer's dementia -Namenda, Provigil, Seroquel on hold for now  Rheumatoid arthritis -Plaquenil on hold for now  Hypothyroidism--resume Synthroid when taking orals  OSA -Does not use CPAP  Goals of care -Overall prognosis is guarded to poor.  Palliative care following  DVT prophylaxis: SCDs Code Status: DNR Family Communication: Daughter and wife at bedside on 01/17/2023.  None at bedside today. Disposition Plan: Status is: Inpatient Remains inpatient appropriate because: Of severity of illness  Consultants: General surgery.  IR.  Palliative care  Procedures: As above  Antimicrobials: Zosyn from 01/15/23 onwards   Subjective: Patient seen and examined at bedside.  No fever, agitation, seizures reported.  Oral intake is still poor.  Objective: Vitals:   01/17/23 1800 01/17/23 2000 01/18/23 0200 01/18/23 0421  BP: 120/74 120/65 (!) 122/98 (!) 144/70  Pulse: 88 82 79 78  Resp: 18 18 (!) 21 20  Temp:  98.8 F (37.1 C)  98.5 F (36.9 C)  TempSrc:  Oral  Oral  SpO2: 99% 96% 97% 99%  Weight:      Height:        Intake/Output Summary (Last 24 hours) at 01/18/2023 0759 Last data filed at 01/18/2023 0420 Gross per 24 hour  Intake 1659.78 ml  Output 1285 ml  Net 374.78 ml    Filed Weights   01/15/23 2100 01/16/23 0434  Weight: 90.7 kg 96.4 kg    Examination:  General: On 3 L oxygen by nasal cannula intermittently.  No acute distress.  Looks chronically ill and deconditioned.  Elderly male lying in bed. ENT/neck: No JVD elevation or palpable neck masses noted  respiratory: Bilateral decreased breath sounds at bases with scattered crackles and intermittent tachypnea  CVS: Rate mostly controlled; S1 and S2 are heard Abdominal: Soft, right upper quadrant  mild tenderness present, distended mildly; no organomegaly, bowel sounds are heard normally.  Right upper quadrant percutaneous tube present Extremities: No clubbing; mild lower  extremity edema present CNS: Awake this morning, still extremely slow to respond but answers his name; still confused.  Poor historian.  No focal neurologic deficit.  Able to move extremities Lymph: No palpable lymphadenopathy noted  skin: No obvious petechiae/rashes  psych: Mostly flat affect.  Currently not agitated musculoskeletal: No obvious joint swelling/deformity     Data Reviewed: I have personally reviewed following labs and imaging studies  CBC: Recent Labs  Lab 01/15/23 2108 01/16/23 0514 01/17/23 0522 01/18/23 0455  WBC 41.0* 37.3* 30.2* 16.8*  NEUTROABS 38.8*  --  27.8* 15.0*  HGB 15.0 13.8 12.6* 11.2*  HCT 46.5 43.8 39.3 35.9*  MCV 93.0 92.8 94.0 96.8  PLT 290 210 170 129*    Basic Metabolic Panel: Recent Labs  Lab 01/15/23 2108 01/16/23 0514 01/17/23 0522 01/18/23 0455  NA 145 143 146* 144  K 4.9 4.6 4.2 3.7  CL 110 109 113* 111  CO2 22 27 25 27   GLUCOSE 248* 157* 140* 118*  BUN 40* 35* 40* 37*  CREATININE 1.58* 1.43* 1.62* 1.68*  CALCIUM 9.1 8.5* 8.3* 8.1*  MG  --   --  2.5* 2.3    GFR: Estimated Creatinine Clearance: 32 mL/min (A) (by C-G formula based on SCr of 1.68 mg/dL (H)). Liver Function Tests: Recent Labs  Lab 01/15/23 2108 01/16/23 0514 01/17/23 0522 01/18/23 0455  AST 98* 68* 31 16  ALT 207* 171* 107* 78*  ALKPHOS 124 103 95 78  BILITOT 2.0* 1.7* 1.2 0.9  PROT 6.8 5.9* 5.2* 5.2*  ALBUMIN 3.5 2.9* 2.4* 2.2*    Recent Labs  Lab 01/15/23 2108  LIPASE 24    No results for input(s): "AMMONIA" in the last 168 hours. Coagulation Profile: Recent Labs  Lab 01/15/23 2108  INR 1.1    Cardiac Enzymes: No results for input(s): "CKTOTAL", "CKMB", "CKMBINDEX", "TROPONINI" in the last 168 hours. BNP (last 3 results) No results for input(s): "PROBNP" in the last 8760 hours. HbA1C: Recent Labs    01/16/23 0514  HGBA1C 6.2*    CBG: Recent Labs  Lab 01/17/23 1615 01/17/23 1951 01/17/23 2323 01/18/23 0410 01/18/23 0727   GLUCAP 121* 136* 122* 112* 115*    Lipid Profile: No results for input(s): "CHOL", "HDL", "LDLCALC", "TRIG", "CHOLHDL", "LDLDIRECT" in the last 72 hours. Thyroid Function Tests: No results for input(s): "TSH", "T4TOTAL", "FREET4", "T3FREE", "THYROIDAB" in the last 72 hours. Anemia Panel: No results for input(s): "VITAMINB12", "FOLATE", "FERRITIN", "TIBC", "IRON", "RETICCTPCT" in the last 72 hours. Sepsis Labs: Recent Labs  Lab 01/15/23 2108 01/15/23 2353 01/16/23 0514  PROCALCITON  --   --  1.49  LATICACIDVEN 3.4* 4.8* 2.0*     Recent Results (from the past 240 hour(s))  Blood Culture (routine x 2)     Status: None (Preliminary result)   Collection Time: 01/15/23  9:55 PM   Specimen: BLOOD  Result Value Ref Range Status   Specimen Description   Final    BLOOD BLOOD RIGHT FOREARM Performed at [facility], [address] [place], [place] [address]    Special Requests   Final    BOTTLES DRAWN AEROBIC AND ANAEROBIC Blood Culture adequate volume Performed at [facility], [address] [place], [place] [address]    Culture   Final    NO GROWTH 2 DAYS Performed at [facility], [address], [place], [place] [address]    Report Status PENDING  Incomplete  Blood Culture (routine x 2)     Status: None (Preliminary result)   Collection Time: 01/15/23 10:00 PM   Specimen: BLOOD  Result Value Ref Range Status   Specimen Description   Final    BLOOD RIGHT ANTECUBITAL Performed at [facility], [address], [place], [place] [address]    Special Requests   Final    BOTTLES DRAWN AEROBIC AND ANAEROBIC Blood Culture adequate volume Performed at [facility], [address] [place], [place] [address]    Culture   Final    NO GROWTH 2 DAYS Performed at [facility], [address], [place], [place] [address]    Report Status PENDING  Incomplete  Resp panel by RT-PCR (RSV, Flu A&B,  Covid) Anterior Nasal Swab     Status: Abnormal   Collection Time: 01/15/23 10:17 PM   Specimen: Anterior Nasal Swab  Result Value Ref Range Status   SARS Coronavirus 2 by RT PCR POSITIVE (A) NEGATIVE Final    Comment: (NOTE) SARS-CoV-2 target nucleic acids are DETECTED.  The SARS-CoV-2 RNA is generally detectable in upper respiratory specimens during the acute phase of infection. Positive results are indicative of the presence of the identified virus, but do not rule out bacterial infection or co-infection with other pathogens not detected by the test. Clinical correlation with patient history and other diagnostic information is necessary to determine patient infection status. The expected result is Negative.  Fact Sheet for Patients: BloggerCourse.com  Fact Sheet for Healthcare Providers: SeriousBroker.it  This test is not yet approved or cleared by the [place] FDA and  has been authorized for detection and/or diagnosis of SARS-CoV-2 by FDA under an Emergency Use Authorization (EUA).  This EUA will remain in effect (meaning this test can be used) for the duration of  the COVID-19 declaration under Section 564(b)(1) of the A ct, 21 U.S.C. section 360bbb-3(b)(1), unless the authorization is terminated or revoked sooner.     Influenza A by PCR NEGATIVE NEGATIVE Final   Influenza B by PCR NEGATIVE NEGATIVE Final    Comment: (NOTE) The Xpert Xpress SARS-CoV-2/FLU/RSV plus assay is intended as an aid in the diagnosis of influenza from Nasopharyngeal swab specimens and should not be used as a sole basis for treatment. Nasal washings and aspirates are unacceptable for Xpert Xpress SARS-CoV-2/FLU/RSV testing.  Fact Sheet for Patients: BloggerCourse.com  Fact Sheet for Healthcare Providers: SeriousBroker.it  This test is not yet approved or cleared by the [place] FDA  and has been authorized for detection and/or diagnosis of SARS-CoV-2 by FDA under an Emergency Use Authorization (EUA). This EUA will remain in effect (meaning this test can be used) for the duration of the COVID-19 declaration under Section 564(b)(1) of the Act, 21 U.S.C. section 360bbb-3(b)(1), unless the authorization is terminated or revoked.     Resp Syncytial Virus by PCR NEGATIVE NEGATIVE Final    Comment: (NOTE) Fact Sheet for Patients: BloggerCourse.com  Fact Sheet for Healthcare Providers: SeriousBroker.it  This test is not yet approved or cleared by the [place] FDA and has been authorized for detection and/or diagnosis of SARS-CoV-2 by FDA under an Emergency Use Authorization (EUA). This EUA will remain in effect (meaning this test can be used) for the duration of the COVID-19 declaration under Section 564(b)(1) of the Act, 21 U.S.C. section 360bbb-3(b)(1), unless the authorization is terminated or  revoked.  Performed at [facility], [address], [place], [place] [address]   Aerobic/Anaerobic Culture w Gram Stain (surgical/deep wound)     Status: None (Preliminary result)   Collection Time: 01/16/23  3:12 PM   Specimen: BILE  Result Value Ref Range Status   Specimen Description   Final    BILE Performed at [facility], [address], [place], [place] [address]    Special Requests   Final    NONE Performed at [facility], [address], [place], [place] [address]    Gram Stain NO WBC SEEN NO ORGANISMS SEEN   Final   Culture   Final    NO GROWTH < 24 HOURS Performed at [facility], [address], [place], [place] [address]    Report Status PENDING  Incomplete         Radiology Studies: IR Perc Cholecystostomy  Result Date: 01/16/2023 CLINICAL DATA:  Acute calculus cholecystitis EXAM: PERCUTANEOUS CHOLECYSTOSTOMY TUBE PLACEMENT  WITH ULTRASOUND AND FLUOROSCOPIC GUIDANCE FLUOROSCOPY: Radiation Exposure Index (as provided by the fluoroscopic device): 4 mGy air Kerma TECHNIQUE: Survey ultrasound of the abdomen was performed and an appropriate skin entry site was identified. Skin site was marked, prepped with chlorhexidine, and draped in usual sterile fashion, and infiltrated locally with 1% lidocaine. Intravenous Fentanyl and Versed 1mg  were administered as conscious sedation during continuous monitoring of the patient's level of consciousness and physiological / cardiorespiratory status by the radiology RN, with a total moderate sedation time of 10 minutes. Under real-time ultrasound guidance, gallbladder was accessed using a transhepatic approach with a 21-gauge needle. Ultrasound image documentation was saved. Bile returned through the hub. Needle was exchanged over a 018 guidewire for transitional dilator which allowed placement of 035 J wire. Over this, a 10.2 French pigtail catheter was advanced and formed centrally in the gallbladder lumen. Small contrast injection confirmed appropriate position. Catheter secured externally with 0 Prolene suture and placed external drain bag. Patient tolerated the procedure well. COMPLICATIONS: COMPLICATIONS none IMPRESSION: 1. Technically successful percutaneous cholecystostomy tube placement with ultrasound and fluoroscopic guidance. Electronically Signed   By: Corlis Leak M.D.   On: 01/16/2023 19:09        Scheduled Meds:  insulin aspart  0-6 Units Subcutaneous Q4H   sodium chloride flush  3 mL Intravenous Q12H   sodium chloride flush  5 mL Intracatheter Q8H   Continuous Infusions:  dextrose 5 % and 0.45% NaCl 75 mL/hr at 01/17/23 2054   piperacillin-tazobactam (ZOSYN)  IV 3.375 g (01/18/23 0229)          Glade Lloyd, MD [facility] 01/18/2023, 7:59 AM

## 2023-01-18 NOTE — TOC Progression Note (Signed)
Transition of Care [facility]) - Progression Note    Patient Details  Name: ELVIRA BRUNDRETT MRN: [id number] Date of Birth: 1927-10-23  [facility] of [facility]) CM/SW Contact  Adrian Prows, RN Phone Number: 01/18/2023, 4:55 PM  Clinical Narrative:    Pt from [facility] awaiting PT eval; Benjaman Kindler at facility notified.   Expected Discharge Plan:  (TBD) Barriers to Discharge: Continued Medical Work up  Expected Discharge Plan and Services                                               Social Determinants of Health (SDOH) Interventions SDOH Screenings   Food Insecurity: No Food Insecurity (01/16/2023)  Housing: Low Risk  (01/16/2023)  Transportation Needs: No Transportation Needs (01/02/2023)  Utilities: Not At Risk (01/02/2023)  Tobacco Use: Low Risk  (01/16/2023)    Readmission Risk Interventions    01/16/2023    2:27 PM  Readmission Risk Prevention Plan  Transportation Screening Complete  PCP or Specialist Appt within 3-5 Days Complete  HRI or Home Care Consult Complete  Social Work Consult for Recovery Care Planning/Counseling Complete  Palliative Care Screening Complete  Medication Review Ocean[employer]) Complete

## 2023-01-18 NOTE — Progress Notes (Signed)
Daily Progress Note   Patient Name: Devin Vance       Date: 01/18/2023 DOB: 1928-03-30  Age: 89 y.o. MRN#: [id number] Attending Physician: Devin Lloyd, MD Primary Care Physician: Devin Housekeeper, MD Admit Date: 01/15/2023  Reason for Consultation/Follow-up: Establishing goals of care  Subjective:  Much more awake alert, drank some clear liquids,  Wife daughter and son in law at bedside asking about PT OT SNF rehab and whether SLP will re evaluate.    Length of Stay: 3  Current Medications: Scheduled Meds:   insulin aspart  0-6 Units Subcutaneous Q4H   sodium chloride flush  3 mL Intravenous Q12H   sodium chloride flush  5 mL Intracatheter Q8H    Continuous Infusions:  dextrose 5 % and 0.45% NaCl 75 mL/hr at 01/18/23 0954   piperacillin-tazobactam (ZOSYN)  IV 3.375 g (01/18/23 0957)    PRN Meds: acetaminophen **OR** acetaminophen, morphine injection, ondansetron **OR** ondansetron (ZOFRAN) IV  Physical Exam         Awake alert Attempts to verbalize Follows some commands No distress Has RUQ drain  Vital Signs: BP (!) 144/70 (BP Location: Left Arm)   Pulse 78   Temp 98.5 F (36.9 C) (Oral)   Resp 20   Ht  (1.778 m)   Wt 96.4 kg   SpO2 99%   BMI 30.49 kg/m  SpO2: SpO2: 99 % O2 Device: O2 Device: Nasal Cannula O2 Flow Rate: O2 Flow Rate (L/min): 3 L/min  Intake/output summary:  Intake/Output Summary (Last 24 hours) at 01/18/2023 1152 Last data filed at 01/18/2023 0420 Gross per 24 hour  Intake 1659.78 ml  Output 1285 ml  Net 374.78 ml   LBM: Last BM Date :  (unable to identify due to forgetfulness) Baseline Weight: Weight: 90.7 kg Most recent weight: Weight: 96.4 kg       Palliative Assessment/Data:      Patient Active Problem List   Diagnosis Date  Noted   Acute calculous cholecystitis 01/16/2023   Hyperglycemia 01/16/2023   Coronary artery disease 01/16/2023   Hypothyroidism 01/16/2023   Prostate cancer 01/15/2023   Polymyalgia rheumatica 01/15/2023   Severe sepsis with lactic acidosis 01/15/2023   Lab test positive for detection of COVID-19 virus 01/02/2023   AAA (abdominal aortic aneurysm) 01/02/2023   Dilation of stomach 01/02/2023  Chronic diarrhea 01/02/2023   Alzheimer's dementia with agitation 06/02/2022   AMS (altered mental status) 02/16/2021   Gait abnormality 07/15/2019   Cardiomyopathy 03/24/2019   COPD (chronic obstructive pulmonary disease) 03/24/2019   HLD (hyperlipidemia) 03/24/2019   Acute renal failure superimposed on stage 3 chronic kidney disease 10/28/2018   Chronic kidney disease, stage 3b 10/28/2018   Rheumatoid arthritis involving multiple sites 10/28/2018   Dementia with behavioral disturbance 10/27/2018   Alzheimer's dementia with behavioral disturbance 07/31/2018   Essential hypertension 08/15/2017   Anemia, mild 02/12/2017   Olecranon bursitis of right elbow 02/12/2017   Kyphosis of cervical region 10/26/2015   Intermittent alternating esotropia 05/18/2015   GERD (gastroesophageal reflux disease) 01/22/2014   Angina pectoris 01/22/2014    Palliative Care Assessment & Plan   Patient Profile:    Assessment:  Mr. 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 He has a history of Alzheimer's dementia for the past 5 to 6 years.  [facility].  He has a recent hospitalization for COVID.  He has been admitted to hospital medicine service with the surgical colleagues and vaginal radiology colleagues following for cholecystitis for which he underwent percutaneous cholecystostomy tube placement on 01-16-2023.  Hospital course complicated by altered mental status and patient not awake alert enough  to have a meaningful oral intake of clear liquids which has been prescribed. Palliative consult for ongoing goals of care discussions has been requested. Chart reviewed, patient seen and examined, discussed with family present at bedside.  Patient has past medical history of Alzheimer's dementia obstructive sleep apnea stage III chronic kidney disease hypertension GERD coronary artery disease and history of prostate cancer. He has a had the cholecystostomy tube placement, he has had gradual progressive functional and cognitive decline from a dementia standpoint even prior to this gallbladder episode and prior to recent COVID hospitalization as well.  Generally at baseline, he is wheelchair-bound and only gives one-word answers.    Recommendations/Plan:   Monitor PO intake and participation with PT OT and SLP Family considering SNF rehab with palliative, if he is deemed to be eligible.    Code Status:    Code Status Orders  (From admission, onward)           Start     Ordered   01/16/23 0019  Do not attempt resuscitation (DNR)  Continuous       Question Answer Comment  If patient has no pulse and is not breathing Do Not Attempt Resuscitation   If patient has a pulse and/or is breathing: Medical Treatment Goals LIMITED ADDITIONAL INTERVENTIONS: Use medication/IV fluids and cardiac monitoring as indicated; Do not use intubation or mechanical ventilation (DNI), also provide comfort medications.  Transfer to Progressive/Stepdown as indicated, avoid Intensive Care.   Consent: Discussion documented in EHR or advanced directives reviewed      01/16/23 0020           Code Status History     Date Active Date Inactive Code Status Order ID Comments User Context   01/02/2023 0818 01/05/2023 1848 Full Code [id number]  Devin Mo, MD ED       Prognosis:  Unable to determine  Discharge Planning: Skilled Nursing Facility for rehab with Palliative care service follow-up  Care  plan was discussed with  patient's wife, son in law and daughter.   Thank you for allowing the Palliative Medicine  Team to assist in the care of this patient. Mod MDM.      Greater than 50%  of this time was spent counseling and coordinating care related to the above assessment and plan.  Devin HawkingZeba Vassie Kugel, MD  Please contact Palliative Medicine Team phone at [phone] for questions and concerns.

## 2023-01-18 NOTE — Plan of Care (Signed)
  Problem: Education: Goal: Knowledge of risk factors and measures for prevention of condition will improve Outcome: Progressing   Problem: Respiratory: Goal: Will maintain a patent airway Outcome: Progressing   Problem: Clinical Measurements: Goal: Signs and symptoms of infection will decrease Outcome: Progressing   Problem: Fluid Volume: Goal: Ability to maintain a balanced intake and output will improve Outcome: Progressing

## 2023-01-18 NOTE — Evaluation (Signed)
Clinical/Bedside Swallow Evaluation Patient Details  Name: Devin Vance MRN: [id number] Date of Birth: September 26, 1928  Today's Date: 01/18/2023 Time: SLP Start Time (ACUTE ONLY): 1400 SLP Stop Time (ACUTE ONLY): 1440 SLP Time Calculation (min) (ACUTE ONLY): 40 min  Past Medical History:  Past Medical History:  Diagnosis Date   Alzheimer disease 07/31/2018   Angina pectoris    Arthritis    Cardiomyopathy    COPD (chronic obstructive pulmonary disease)    Gastroesophageal reflux disease    History of colon polyps    Hyperlipidemia    Hypertension    Obesity    Osteoporosis    Polymyalgia rheumatica    Prostate cancer    Rheumatoid arthritis    Secondary Parkinson disease 08/13/2019   Sleep apnea    Past Surgical History:  Past Surgical History:  Procedure Laterality Date   CATARACT EXTRACTION     CORONARY ANGIOPLASTY     HEMORRHOIDECTOMY WITH HEMORRHOID BANDING     IR PERC CHOLECYSTOSTOMY  01/16/2023   PROSTATECTOMY     STRABISMUS SURGERY     TONSILLECTOMY     HPI:  89 y.o. male with medical history significant for Alzheimer's dementia, CAD, CKD stage IIIb, COPD, rheumatoid arthritis, HTN, HLD, prostate cancer, OSA not using CPAP, recent hospitalization from 01/02/2023-12/07/2022 with COVID-19 infection treated with Paxlovid and oral steroids presented with worsening abdominal pain and nausea along with fever.  On presentation, WBC was 41, creatinine 1.58, AST 98, ALT 207, alkaline phosphatase 124, total bili of 2, lipase 24, lactic acid 3.4.  COVID-19 PCR positive.  Chest x-ray showed low lung volumes without focal consolidation, edema or effusion.  CT of abdomen/pelvis with contrast showed cholelithiasis with acute cholecystitis.  General surgery was consulted.  He was started on IV fluids and antibiotics.  Family denies patient having diagnosis of Parkinson's but do report that he participates in class that helps him with physical normal ability and and voiced drink.  They report his  ambulation has diminished and his voice has weakened some since COVID.  Daughter reports patient with occasional cough with intake before admission, but wife denies this occurring.    Assessment / Plan / Recommendation  Clinical Impression  Patient presents with clinical indications concerning for potential dysphagia and aspiration.  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  This anatomical variance may manage intraoral pressure and negatively impact patient's swallowing.  Family denies patient having nasal regurgitation of any intake however.  Today patient presents with weak congested cough as well as weak voice.  He was able to feed himself with coughing noted across approximately 60% of liquid intake and 20% of solid p.o.  Clinically patient not judged to tolerate thin better than nectar, thus recommend full liquid diet with very strict precautions and plan MBS 01/19/2023.  Family present and reports he was not coughing today despite being awake until after his lunchtime meal.  They report consistent ongoing coughing post lunch elevating concern for aspiration.  In addition frequent eructation noted which may indicate esophageal/reflux issues.    Recommended family only give patient very small amounts stopping intake if he begins to excessively cough.  Inform family that given patient's dementia, and anatomical abnormality, cannot rule out aspiration.  Patient tends to take very large amounts and advised to alternatives to encourage smaller bites and sips.  Family agreeable to pursue MBSS next day.    Relayed to Mrs. Basista and her daughter to  lack of evidence supporting any feeding tube placement in patients with over the age of 80 with dementia and that the #1 complication is aspiration pneumonia.     Mrs. Keath reports she does not take patients dentures out nightly, and SLP advised her to do and to brush patient's gums and tongue to decrease bacterial load.   Requested they remove them tonight and replaced them tomorrow before he goes to swallowing test. SLP Visit Diagnosis: Dysphagia, unspecified (R13.10);Dysphagia, oropharyngeal phase (R13.12)    Aspiration Risk  Moderate aspiration risk    Diet Recommendation Thin liquid (full liquid diet)   Liquid Administration via: Cup;Straw Medication Administration: Whole meds with liquid Supervision: Patient able to self feed Compensations: Slow rate;Small sips/bites    Other  Recommendations Oral Care Recommendations: Oral care BID    Recommendations for follow up therapy are one component of a multi-disciplinary discharge planning process, led by the attending physician.  Recommendations may be updated based on patient status, additional functional criteria and insurance authorization.  Follow up Recommendations        Assistance Recommended at Discharge    Functional Status Assessment Patient has had a recent decline in their functional status and demonstrates the ability to make significant improvements in function in a reasonable and predictable amount of time.  Frequency and Duration min 1 x/week  1 week       Prognosis Prognosis for improved oropharyngeal function: Fair Barriers to Reach Goals: Severity of deficits;Other (Comment) (patient's advanced age and dementia diagnosis)      Swallow Study   General Date of Onset: 01/18/23 HPI: 89 y.o. male with medical history significant for Alzheimer's dementia, CAD, CKD stage IIIb, COPD, rheumatoid arthritis, HTN, HLD, prostate cancer, OSA not using CPAP, recent hospitalization from 01/02/2023-12/07/2022 with COVID-19 infection treated with Paxlovid and oral steroids presented with worsening abdominal pain and nausea along with fever.  On presentation, WBC was 41, creatinine 1.58, AST 98, ALT 207, alkaline phosphatase 124, total bili of 2, lipase 24, lactic acid 3.4.  COVID-19 PCR positive.  Chest x-ray showed low lung volumes without focal  consolidation, edema or effusion.  CT of abdomen/pelvis with contrast showed cholelithiasis with acute cholecystitis.  General surgery was consulted.  He was started on IV fluids and antibiotics.  Family denies patient having diagnosis of Parkinson's but do report that he participates in class that helps him with physical normal ability and and voiced drink.  They report his ambulation has diminished and his voice has weakened some since COVID.  Daughter reports patient with occasional cough with intake before admission, but wife denies this occurring. Type of Study: Bedside Swallow Evaluation Diet Prior to this Study: Dysphagia 3 (mechanical soft);Thin liquids (Level 0) Temperature Spikes Noted: No Respiratory Status: Nasal cannula History of Recent Intubation: No Behavior/Cognition: Alert;Doesn't follow directions;Other (Comment) (Patient has dementia therefore inconsistently follows directions) Oral Cavity Assessment: Dry Oral Care Completed by SLP: No Oral Cavity - Dentition: Dentures, top;Dentures, bottom Vision: Functional for self-feeding Self-Feeding Abilities: Able to feed self;Needs set up Patient Positioning: Upright in bed Baseline Vocal Quality: Low vocal intensity;Hoarse Volitional Cough: Weak;Congested Volitional Swallow: Unable to elicit    Oral/Motor/Sensory Function Overall Oral Motor/Sensory Function: Generalized oral weakness Velum: Other (comment) (Patient appears with bifurcated velum, wife reports this was due to an injury when he was a child.)   Ice Chips Ice chips: Not tested   Thin Liquid Thin Liquid: Impaired Presentation: Cup;Straw;Self Fed Pharyngeal  Phase Impairments: Cough - Immediate;Cough - Delayed  Nectar Thick Nectar Thick Liquid: Impaired Presentation: Cup;Straw Pharyngeal Phase Impairments: Cough - Delayed   Honey Thick Honey Thick Liquid: Not tested   Puree Puree: Within functional limits Presentation: Self Fed;Spoon ; Cough delayed  Solid      Solid: Within functional limits Presentation: Self Fed ; Cough delayed     Chales AbrahamsKimball, Zong Mcquarrie Ann 01/18/2023,4:43 PM   Rolena Infanteammy K, MS [facility] SLP Acute Rehab Services Office [phone]

## 2023-01-18 NOTE — Progress Notes (Addendum)
Referring Physician(s): Dr. Milford Cage  Supervising Physician: Irish Lack  Patient Status:  [facility] - In-pt  Chief Complaint: Acute calculus cholecystitis s/p  cholecystomy tube placed on 4.3.24 by Dr. DD Deanne Coffer  Subjective:  Patient eating eggs with excessive amount of coughing. Family at bedside. Bedside RN made aware.   Allergies: Lorazepam  Medications: Prior to Admission medications   Medication Sig Start Date End Date Taking? Authorizing Provider  aspirin EC 81 MG tablet Take 81 mg by mouth at bedtime.   Yes [provider]  Cholecalciferol (VITAMIN D-3 PO) Take 1,000 Units by mouth daily.   Yes [provider]  Cyanocobalamin (VITAMIN B12) 1000 MCG TBCR Take 2,500 mcg by mouth daily.   Yes [provider]  Fluticasone Propionate (FLONASE ALLERGY RELIEF NA) Place 2 sprays into both nostrils daily as needed (Rhinitis).   Yes [provider]  guaiFENesin (MUCINEX) 600 MG 12 hr tablet Take 1 tablet (600 mg total) by mouth 2 (two) times daily. 01/05/23  Yes Regalado, Belkys A, MD  hydroxychloroquine (PLAQUENIL) 200 MG tablet Take 200 mg by mouth See admin instructions. Take 200 mg (1 tablet) every other day, alternating with 400 mg (2 tablets) every other day. 06/11/19  Yes [provider]  ipratropium-albuterol (DUONEB) 0.5-2.5 (3) MG/3ML SOLN Take 3 mLs by nebulization 2 (two) times daily. 01/05/23  Yes Regalado, Belkys A, MD  Lactobacillus (PROBIOTIC ACIDOPHILUS PO) Take 1 tablet by mouth daily.   Yes [provider]  levothyroxine (SYNTHROID) 25 MCG tablet Take 25 mcg by mouth daily before breakfast. 04/29/20  Yes [provider]  memantine (NAMENDA) 10 MG tablet Take 1 tablet (10 mg total) by mouth 2 (two) times daily. 05/31/22  Yes Anson Fret, MD  metoprolol succinate (TOPROL-XL) 25 MG 24 hr tablet Take 12.5 mg by mouth daily. 08/20/16 01/16/23 Yes [provider]  modafinil (PROVIGIL) 100 MG tablet  Take 1 tablet (100 mg total) by mouth daily. 12/05/22  Yes Anson Fret, MD  Multiple Vitamin (MULTIVITAMIN) capsule Take 1 capsule by mouth daily.   Yes [provider]  pantoprazole (PROTONIX) 40 MG tablet Take 1 tablet (40 mg total) by mouth daily. 01/05/23  Yes Regalado, Belkys A, MD  QUEtiapine (SEROQUEL) 25 MG tablet Take 1 tablet (25 mg total) by mouth at bedtime. 05/31/22  Yes Anson Fret, MD     Vital Signs: BP (!) 144/70 (BP Location: Left Arm)   Pulse 78   Temp 98.5 F (36.9 C) (Oral)   Resp 20   Ht  (1.778 m)   Wt 212 lb 8.4 oz (96.4 kg)   SpO2 99%   BMI 30.49 kg/m   Physical Exam Vitals and nursing note reviewed.  Constitutional:      Appearance: He is ill-appearing.  HENT:     Head: Normocephalic.  Pulmonary:     Effort: Pulmonary effort is normal.  Abdominal:     Comments: Positive RUQ  drain to gravity bag. Site is unremarkable with no erythema, edema, tenderness, bleeding or drainage noted at exit site. Suture and stat lock in place. Dressing is clean dry and intact. 75 ml of  dark brown colored fluid noted in gravity bag. Drain is able to be flushed easily. No leakage or pain with flushing.  Insertion site clean and dry.     Musculoskeletal:        General: Normal range of motion.     Cervical back: Normal range of motion.  Skin:    General: Skin is dry.  Neurological:     Mental Status: He is alert and oriented to person, place, and time.     Imaging: IR Perc Cholecystostomy  Result Date: 01/16/2023 CLINICAL DATA:  Acute calculus cholecystitis EXAM: PERCUTANEOUS CHOLECYSTOSTOMY TUBE PLACEMENT WITH ULTRASOUND AND FLUOROSCOPIC GUIDANCE FLUOROSCOPY: Radiation Exposure Index (as provided by the fluoroscopic device): 4 mGy air Kerma TECHNIQUE: Survey ultrasound of the abdomen was performed and an appropriate skin entry site was identified. Skin site was marked, prepped with chlorhexidine, and draped in usual sterile fashion, and  infiltrated locally with 1% lidocaine. Intravenous Fentanyl and Versed 1mg  were administered as conscious sedation during continuous monitoring of the patient's level of consciousness and physiological / cardiorespiratory status by the radiology RN, with a total moderate sedation time of 10 minutes. Under real-time ultrasound guidance, gallbladder was accessed using a transhepatic approach with a 21-gauge needle. Ultrasound image documentation was saved. Bile returned through the hub. Needle was exchanged over a 018 guidewire for transitional dilator which allowed placement of 035 J wire. Over this, a 10.2 French pigtail catheter was advanced and formed centrally in the gallbladder lumen. Small contrast injection confirmed appropriate position. Catheter secured externally with 0 Prolene suture and placed external drain bag. Patient tolerated the procedure well. COMPLICATIONS: COMPLICATIONS none IMPRESSION: 1. Technically successful percutaneous cholecystostomy tube placement with ultrasound and fluoroscopic guidance. Electronically Signed   By: Corlis Leak M.D.   On: 01/16/2023 19:09   CT Abdomen Pelvis W Contrast  Result Date: 01/15/2023 CLINICAL DATA:  Abdominal pain. EXAM: CT ABDOMEN AND PELVIS WITH CONTRAST TECHNIQUE: Multidetector CT imaging of the abdomen and pelvis was performed using the standard protocol following bolus administration of intravenous contrast. RADIATION DOSE REDUCTION: This exam was performed according to the departmental dose-optimization program which includes automated exposure control, adjustment of the mA and/or kV according to patient size and/or use of iterative reconstruction technique. CONTRAST:  28mL OMNIPAQUE IOHEXOL 300 MG/ML  SOLN COMPARISON:  CT abdomen pelvis dated 01/02/2023. FINDINGS: Lower chest: There are bibasilar subpleural atelectasis. There is coronary vascular calcification. No intra-abdominal free air. Upper abdominal and perihepatic ascites. Hepatobiliary:  The liver is unremarkable. There is mild biliary dilatation versus periportal edema. The gallbladder is distended. Multiple stones noted within the gallbladder. There is a impacted appearing stone in the neck of the gallbladder. There is extensive inflammatory changes and edema surrounding the gallbladder. Findings most consistent with acute cholecystitis. Pancreas: There is mild inflammatory changes and stranding adjacent to the pancreas, likely related to inflammatory changes of the gallbladder. Acute pancreatitis is less likely. Correlation with pancreatic enzymes recommended. Spleen: Normal in size without focal abnormality. Adrenals/Urinary Tract: The adrenal glands are unremarkable. There is no hydronephrosis on either side. There is symmetric enhancement and excretion of contrast by both kidneys. The visualized ureters and urinary bladder appear unremarkable. Stomach/Bowel: There is no bowel obstruction or active inflammation. The appendix is normal. Vascular/Lymphatic: Moderate aortoiliac atherosclerotic disease. The IVC is unremarkable. No portal venous gas. There is no adenopathy. Reproductive: Prostatectomy. Other: Midline vertical anterior pelvic wall incisional scar. Musculoskeletal: Degenerative changes of the spine. No acute osseous pathology. IMPRESSION: 1. Cholelithiasis with findings of acute cholecystitis. 2. No bowel obstruction. Normal appendix. 3.  Aortic Atherosclerosis (ICD10-I70.0). Electronically Signed   By: Elgie Collard M.D.   On: 01/15/2023 22:49   DG Chest Port 1 View  Result Date: 01/15/2023 CLINICAL DATA:  Questionable sepsis-evaluate for abnormality. 4 hours of upper abdominal pain and nausea.  EXAM: PORTABLE CHEST 1 VIEW COMPARISON:  Radiographs 01/02/2023 FINDINGS: Stable cardiomediastinal silhouette given patient rotation. Aortic atherosclerotic calcification. Low lung volumes accentuate pulmonary vascularity. Bibasilar/perihilar atelectasis, decreased from 01/02/2023.  Gaseous distention of the stomach. No displaced rib fractures. IMPRESSION: Low lung volumes.  No active disease. Electronically Signed   By: Minerva Festeryler  Stutzman M.D.   On: 01/15/2023 21:55    Labs:  CBC: Recent Labs    01/15/23 2108 01/16/23 0514 01/17/23 0522 01/18/23 0455  WBC 41.0* 37.3* 30.2* 16.8*  HGB 15.0 13.8 12.6* 11.2*  HCT 46.5 43.8 39.3 35.9*  PLT 290 210 170 129*    COAGS: Recent Labs    01/02/23 0256 01/15/23 2108  INR 1.1 1.1  APTT 28 26    BMP: Recent Labs    01/15/23 2108 01/16/23 0514 01/17/23 0522 01/18/23 0455  NA 145 143 146* 144  K 4.9 4.6 4.2 3.7  CL 110 109 113* 111  CO2 22 27 25 27   GLUCOSE 248* 157* 140* 118*  BUN 40* 35* 40* 37*  CALCIUM 9.1 8.5* 8.3* 8.1*  CREATININE 1.58* 1.43* 1.62* 1.68*  GFRNONAA 41* 46* 39* 38*    LIVER FUNCTION TESTS: Recent Labs    01/15/23 2108 01/16/23 0514 01/17/23 0522 01/18/23 0455  BILITOT 2.0* 1.7* 1.2 0.9  AST 98* 68* 31 16  ALT 207* 171* 107* 78*  ALKPHOS 124 103 95 78  PROT 6.8 5.9* 5.2* 5.2*  ALBUMIN 3.5 2.9* 2.4* 2.2*    Assessment and Plan:  89 y.o. male inpatient. History of alzheimer's dementia. CAD, CKD, COPD, RA, HTN., HLD, prostate cancer. Recent admission for COVID. [facility] with abdominal pain, nausea and vomiting. Found to have acute calculus cholecystitis. IR placed a cholecystomy tube on 4.3.24.  Drain Location: RUQ Size: Fr size: 10 Fr Date of placement: 4.3.24  Currently to: Drain collection device: gravity 24 hour output:  Output by Drain (mL) 01/16/23 0701 - 01/16/23 1900 01/16/23 1901 - 01/17/23 0700 01/17/23 0701 - 01/17/23 1900 01/17/23 1901 - 01/18/23 0700 01/18/23 0701 - 01/18/23 1013  Biliary Tube Cook slip-coat 10.2 Fr. RUQ 20 30 225 160   WBC is 16.8. Cultures shows no growth X 2 days  Interval imaging/drain manipulation:  None since drain placement  Current examination: Flushes/aspirates easily.  Insertion site  unremarkable. Suture and stat lock in place. Dressed appropriately.   Plan: Continue TID flushes with 5 cc NS. Record output Q shift. Dressing changes QD or PRN if soiled.  Call IR APP or on call IR MD if difficulty flushing or sudden change in drain output.   Discharge planning: Outpatient orders in place. Saline flushes sent to patient's prescription mail order. Typically patient will follow up with IR in 6-12 weeks post d/c drain injection and exchange IR scheduler will contact patient with date/time of appointment. Patient will need to flush drain QD with 5 cc NS, record output QD, dressing changes every 2-3 days or earlier if soiled.   IR will continue to follow - please call with questions or concerns.   Electronically Signed: Alene MiresJennifer C Velinda Wrobel, NP 01/18/2023, 10:05 AM   I spent a total of 15 Minutes at the patient's bedside AND on the patient's hospital floor or unit, greater than 50% of which was counseling/coordinating care for cholecystomy tube

## 2023-01-18 NOTE — Progress Notes (Signed)
[facility] Progress Note     Subjective: CC:  Resting comfortably, more alert this morning, opens eyes to voice and is answering some yes or no questions.  Objective: Vital signs in last 24 hours: Temp:  [98.5 F (36.9 C)-98.8 F (37.1 C)] 98.5 F (36.9 C) (04/05 0421) Pulse Rate:  [78-88] 78 (04/05 0421) Resp:  [18-21] 20 (04/05 0421) BP: (120-144)/(65-98) 144/70 (04/05 0421) SpO2:  [96 %-99 %] 99 % (04/05 0421) Last BM Date :  (unable to identify due to forgetfulness)  Intake/Output from previous day: 04/04 0701 - 04/05 0700 In: 1659.8 [I.V.:1494.5; IV Piggyback:155.3] Out: 1285 [Urine:900; Drains:385] Intake/Output this shift: No intake/output data recorded.  PE: Gen:  resting, eyes closed, NAD Card:  Regular rate and rhythm 88 bpm Pulm:  Normal effort  Abd: Soft, mild upper abdominal tenderness without guarding, drain in RUQ w/ bilious efflient (50 mL) Skin: warm and dry, no rashes  Neuro: Awakens to voice, answers some yes or no questions  Lab Results:  Recent Labs    01/17/23 0522 01/18/23 0455  WBC 30.2* 16.8*  HGB 12.6* 11.2*  HCT 39.3 35.9*  PLT 170 129*    BMET Recent Labs    01/17/23 0522 01/18/23 0455  NA 146* 144  K 4.2 3.7  CL 113* 111  CO2 25 27  GLUCOSE 140* 118*  BUN 40* 37*  CREATININE 1.62* 1.68*  CALCIUM 8.3* 8.1*    PT/INR Recent Labs    01/15/23 2108  LABPROT 13.6  INR 1.1    CMP     Component Value Date/Time   NA 144 01/18/2023 0455   NA 144 07/15/2019 1407   NA 145 04/09/2014 1759   K 3.7 01/18/2023 0455   K 4.2 04/09/2014 1759   CL 111 01/18/2023 0455   CL 112 (H) 04/09/2014 1759   CO2 27 01/18/2023 0455   CO2 27 04/09/2014 1759   GLUCOSE 118 (H) 01/18/2023 0455   GLUCOSE 117 (H) 04/09/2014 1759   BUN 37 (H) 01/18/2023 0455   BUN 35 (H) 07/15/2019 1407   BUN 30 (H) 04/09/2014 1759   CREATININE 1.68 (H) 01/18/2023 0455   CREATININE 1.61 (H) 04/09/2014 1759   CALCIUM 8.1 (L) 01/18/2023 0455    CALCIUM 8.6 04/09/2014 1759   PROT 5.2 (L) 01/18/2023 0455   PROT 5.9 (L) 07/15/2019 1407   PROT 6.8 04/09/2014 1759   ALBUMIN 2.2 (L) 01/18/2023 0455   ALBUMIN 4.0 07/15/2019 1407   ALBUMIN 3.5 04/09/2014 1759   AST 16 01/18/2023 0455   AST 34 04/09/2014 1759   ALT 78 (H) 01/18/2023 0455   ALT 35 04/09/2014 1759   ALKPHOS 78 01/18/2023 0455   ALKPHOS 62 04/09/2014 1759   BILITOT 0.9 01/18/2023 0455   BILITOT 0.3 07/15/2019 1407   BILITOT 0.3 04/09/2014 1759   GFRNONAA 38 (L) 01/18/2023 0455   GFRNONAA 39 (L) 04/09/2014 1759   GFRAA 47 (L) 07/15/2019 1407   GFRAA 45 (L) 04/09/2014 1759   Lipase     Component Value Date/Time   LIPASE 24 01/15/2023 2108       Studies/Results: IR Perc Cholecystostomy  Result Date: 01/16/2023 CLINICAL DATA:  Acute calculus cholecystitis EXAM: PERCUTANEOUS CHOLECYSTOSTOMY TUBE PLACEMENT WITH ULTRASOUND AND FLUOROSCOPIC GUIDANCE FLUOROSCOPY: Radiation Exposure Index (as provided by the fluoroscopic device): 4 mGy air Kerma TECHNIQUE: Survey ultrasound of the abdomen was performed and an appropriate skin entry site was identified. Skin site was marked, prepped with chlorhexidine, and draped in usual  sterile fashion, and infiltrated locally with 1% lidocaine. Intravenous Fentanyl 50mcg and Versed 1mg  were administered as conscious sedation during continuous monitoring of the patient's level of consciousness and physiological / cardiorespiratory status by the radiology RN, with a total moderate sedation time of 10 minutes. Under real-time ultrasound guidance, gallbladder was accessed using a transhepatic approach with a 21-gauge needle. Ultrasound image documentation was saved. Bile returned through the hub. Needle was exchanged over a 018 guidewire for transitional dilator which allowed placement of 035 J wire. Over this, a 10.2 French pigtail catheter was advanced and formed centrally in the gallbladder lumen. Small contrast injection confirmed appropriate  position. Catheter secured externally with 0 Prolene suture and placed external drain bag. Patient tolerated the procedure well. COMPLICATIONS: COMPLICATIONS none IMPRESSION: 1. Technically successful percutaneous cholecystostomy tube placement with ultrasound and fluoroscopic guidance. Electronically Signed   By: Corlis Leak  Hassell M.D.   On: 01/16/2023 19:09    Anti-infectives: Anti-infectives (From admission, onward)    Start     Dose/Rate Route Frequency Ordered Stop   01/16/23 1000  ceFEPIme (MAXIPIME) 2 g in sodium chloride 0.9 % 100 mL IVPB  Status:  Discontinued        2 g 200 mL/hr over 30 Minutes Intravenous Every 12 hours 01/15/23 2343 01/16/23 0020   01/16/23 0200  piperacillin-tazobactam (ZOSYN) IVPB 3.375 g        3.375 g 12.5 mL/hr over 240 Minutes Intravenous Every 8 hours 01/16/23 0038     01/15/23 2145  ceFEPIme (MAXIPIME) 2 g in sodium chloride 0.9 % 100 mL IVPB        2 g 200 mL/hr over 30 Minutes Intravenous  Once 01/15/23 2133 01/15/23 2309   01/15/23 2145  metroNIDAZOLE (FLAGYL) IVPB 500 mg        500 mg 100 mL/hr over 60 Minutes Intravenous  Once 01/15/23 2133 01/16/23 0052          Latest Ref Rng & Units 01/18/2023    4:55 AM 01/17/2023    5:22 AM 01/16/2023    5:14 AM  Hepatic Function  Total Protein 6.5 - 8.1 g/dL 5.2  5.2  5.9   Albumin 3.5 - 5.0 g/dL 2.2  2.4  2.9   AST 15 - 41 U/L 16  31  68   ALT 0 - 44 U/L 78  107  171   Alk Phosphatase 38 - 126 U/L 78  95  103   Total Bilirubin 0.3 - 1.2 mg/dL 0.9  1.2  1.7     Assessment/Plan Cholecystitis S/p percutaneous cholecystostomy tube placement 01/16/23 Dr. Rica RecordsHassel  - afebrile, VSS, WBC downtrending now 16.8 from 30.2 yesterday, 41 on presentation - LFTs slightly improved as above - BUN/Cr typically stable this morning, AKI on CKD per primary team  - no emergent surgical needs.  He will not be a candidate for cholecystectomy in the future, and his drain will be definitive treatment for his cholecystitis.  He will  need follow-up with radiology for drain injection/cholangiogram in about 6 weeks to determine whether the tube can be removed.  If cystic duct remains obstructed he will need to keep the drain at that time.  General surgery team will be available as needed, please call if there are questions or concerns, but will sign off at this time.    FEN: CLD, ok to ADAT per SLP recs  ID: [id number] 4/2 >>, continue abx. Cx from cholecystostomy tube with no organisms.  Foley: external cath in place  VTE: SCD's, ok for chemical DVT ppx from surgery standpoint  Alzheimer's dementia HTN GERD CAD OSA CKD III  AAA RA PMH prostate CA    LOS: 3 days   I reviewed nursing notes, Consultant IR notes, hospitalist notes, last 24 h vitals and pain scores, last 48 h intake and output, last 24 h labs and trends, and last 24 h imaging results.  This care required straight-forward level of medical decision making.   Berna Bue MD [facility] Please see Amion for pager number during day hours 7:00am-4:30pm

## 2023-01-18 NOTE — Progress Notes (Signed)
PT Cancellation Note  Patient Details Name: COREE HAGENOW MRN: [id number] DOB: 1928-07-06   Cancelled Treatment:    Reason Eval/Treat Not Completed: Patient at procedure or test/unavailable, with speech. Will check back later   Blanchard Kelch PT Acute Rehabilitation Services Office [phone] Weekend pager-[phone]  Rada Hay 01/18/2023, 2:27 PM

## 2023-01-18 NOTE — Evaluation (Signed)
Physical Therapy Evaluation Patient Details Name: Devin Vance MRN: [id number] DOB: 28-Jul-1928 Today's Date: 01/18/2023  History of Present Illness  Pt is 89 yo male admitted on 01/15/23 with severe sepsis due to acute calculus cholecystitis.  Pt s/p IR guided percutaneous cholecystostomy tube on 01/16/23.  Pt with hx including recent hospitalization for COVID, Alzheimer's dementia, CAD, CKD stage IIIb, COPD, rheumatoid arthritis, HTN, HLD, prostate cancer, OSA not using CPAP   Clinical Impression  Pt admitted with above diagnosis. At baseline, pt is ambulatory with rollator and some assist. He resides at ILF with his wife and requires assist for ADLs.  They have a PCA 6 hr day for 3 days a week.  Today, pt requiring mod/max A bed mobility and was unable to stand with max x 2 from elevated bed.  Pt fatigued quickly so deferred transfer to chair.   Pt is well below his baseline. Pt currently with functional limitations due to the deficits listed below (see PT Problem List). Pt will benefit from acute skilled PT to increase their independence and safety with mobility to allow discharge.  Additionally, at d/c patient will benefit from continued inpatient follow up therapy, <3 hours/day         Recommendations for follow up therapy are one component of a multi-disciplinary discharge planning process, led by the attending physician.  Recommendations may be updated based on patient status, additional functional criteria and insurance authorization.  Follow Up Recommendations Can patient physically be transported by private vehicle: No     Assistance Recommended at Discharge Frequent or constant Supervision/Assistance  Patient can return home with the following  Two people to help with walking and/or transfers;Two people to help with bathing/dressing/bathroom    Equipment Recommendations Wheelchair (measurements PT);Wheelchair cushion (measurements PT)  Recommendations for Other Services        Functional Status Assessment Patient has had a recent decline in their functional status and demonstrates the ability to make significant improvements in function in a reasonable and predictable amount of time.     Precautions / Restrictions Precautions Precautions: Fall;Other (comment) Precaution Comments: Drain mid-high R back - watch gait belt placement      Mobility  Bed Mobility Overal bed mobility: Needs Assistance Bed Mobility: Rolling, Sidelying to Sit, Sit to Supine Rolling: Mod assist Sidelying to sit: Max assist   Sit to supine: Max assist   General bed mobility comments: Requiring multimodal cues, increased time, assist for legs and trunk    Transfers Overall transfer level: Needs assistance Equipment used: 2 person hand held assist Transfers: Sit to/from Stand Sit to Stand: From elevated surface, Max assist, +2 physical assistance           General transfer comment: Attempted x 3.  Started with assist of 1 and called for NT to assist.  With +2 max and bed elevated pt barely able to clear buttock.  Deferred transfer to chair as pt unable without lift and fatiguing quickly.  Returned to supine    Ambulation/Gait                  Stairs            Wheelchair Mobility    Modified Rankin (Stroke Patients Only)       Balance Overall balance assessment: Needs assistance Sitting-balance support: Feet supported, Bilateral upper extremity supported Sitting balance-Leahy Scale: Poor Sitting balance - Comments: Needs UE support and supervision to min A -leaning forward     Standing balance-Leahy Scale:  Zero                               Pertinent Vitals/Pain Pain Assessment Pain Assessment: Faces Pain Location: Drain site; generalized with movement Pain Descriptors / Indicators: Grimacing Pain Intervention(s): Limited activity within patient's tolerance, Monitored during session    Home Living Family/patient expects to be  discharged to:: Skilled nursing facility (Pt from [facility]) Living Arrangements: Spouse/significant other;Non-relatives/Friends;Children Available Help at Discharge: Family;Personal care attendant;Available 24 hours/day (Wife available 24 hr; Has hired aide 3 days week 6 hours) Type of Home: Independent living facility Home Access: Level entry;Elevator       Home Layout: One level Home Equipment: Rollator (4 wheels)      Prior Function Prior Level of Function : Needs assist             Mobility Comments: Pt could ambulate with rollator and supervision; Since recent hospitalization had returned to ambulation but wife reports not as far or well as baseline ADLs Comments: Assist from wife or PCA for all ADLs     Hand Dominance        Extremity/Trunk Assessment   Upper Extremity Assessment Upper Extremity Assessment: Generalized weakness;Difficult to assess due to impaired cognition (Not following MMT commands; ROM: limited shoulder elevation)    Lower Extremity Assessment Lower Extremity Assessment: LLE deficits/detail;RLE deficits/detail;Generalized weakness;Difficult to assess due to impaired cognition RLE Deficits / Details: ROM: hip and ankle WFL, knee lacking 15 degrees with tight hamstrings; MMT: <2/5 hip and knee LLE Deficits / Details: ROM: hip and ankle WFL, knee lacking 15 degrees with tight hamstrings; MMT: <2/5 hip and knee    Cervical / Trunk Assessment Cervical / Trunk Assessment: Kyphotic;Other exceptions Cervical / Trunk Exceptions: kyphotic, forward head; weak trunk  Communication   Communication: No difficulties  Cognition Arousal/Alertness: Awake/alert Behavior During Therapy: Flat affect Overall Cognitive Status: History of cognitive impairments - at baseline                                 General Comments: Pt quite and pleasant.  Responded with "Yes/No" and occasional other phrases.  Wife present and provided hx.  Pt  following basic simple commands with multimodal cues and increased time. Hx of dementia        General Comments General comments (skin integrity, edema, etc.): Pt was on 3 L with O2 sats 100%.  Tried RA for transfers but dropping to 88-91%.  Plced back on 3 L at end of session with sats 100%    Exercises     Assessment/Plan    PT Assessment Patient needs continued PT services  PT Problem List Decreased strength;Decreased mobility;Decreased safety awareness;Decreased knowledge of precautions;Decreased cognition;Decreased balance;Cardiopulmonary status limiting activity;Decreased activity tolerance;Decreased range of motion       PT Treatment Interventions DME instruction;Therapeutic activities;Cognitive remediation;Gait training;Therapeutic exercise;Patient/family education;Functional mobility training;Balance training    PT Goals (Current goals can be found in the Care Plan section)  Acute Rehab PT Goals Patient Stated Goal: wife reports wants rehab at d/c PT Goal Formulation: With patient/family Time For Goal Achievement: 02/01/23 Potential to Achieve Goals: Good    Frequency Min 2X/week     Co-evaluation               AM-PAC PT "6 Clicks" Mobility  Outcome Measure Help needed turning from your back to your side  while in a flat bed without using bedrails?: A Lot Help needed moving from lying on your back to sitting on the side of a flat bed without using bedrails?: A Lot Help needed moving to and from a bed to a chair (including a wheelchair)?: Total Help needed standing up from a chair using your arms (e.g., wheelchair or bedside chair)?: Total Help needed to walk in hospital room?: Total Help needed climbing 3-5 steps with a railing? : Total 6 Click Score: 8    End of Session Equipment Utilized During Treatment: Gait belt Activity Tolerance: Patient tolerated treatment well Patient left: in bed;with call bell/phone within reach;with SCD's reapplied;with  family/visitor present (heels floated) Nurse Communication: Mobility status PT Visit Diagnosis: Unsteadiness on feet (R26.81);Difficulty in walking, not elsewhere classified (R26.2);Muscle weakness (generalized) (M62.81)    Time: 1724-1750 PT Time Calculation (min) (ACUTE ONLY): 26 min   Charges:   PT Evaluation $PT Eval Moderate Complexity: 1 Mod PT Treatments $Therapeutic Activity: 8-22 mins        Anise Salvoacia, PT [facility] [phone]   Rayetta HumphreyDacia H Onetta Spainhower 01/18/2023, 6:06 PM

## 2023-01-19 ENCOUNTER — Inpatient Hospital Stay (HOSPITAL_COMMUNITY): Payer: Medicare Other

## 2023-01-19 ENCOUNTER — Encounter (HOSPITAL_COMMUNITY): Payer: Self-pay | Admitting: Internal Medicine

## 2023-01-19 DIAGNOSIS — K8000 Calculus of gallbladder with acute cholecystitis without obstruction: Secondary | ICD-10-CM | POA: Diagnosis not present

## 2023-01-19 DIAGNOSIS — R6520 Severe sepsis without septic shock: Secondary | ICD-10-CM | POA: Diagnosis not present

## 2023-01-19 DIAGNOSIS — G301 Alzheimer's disease with late onset: Secondary | ICD-10-CM | POA: Diagnosis not present

## 2023-01-19 DIAGNOSIS — A419 Sepsis, unspecified organism: Secondary | ICD-10-CM | POA: Diagnosis not present

## 2023-01-19 DIAGNOSIS — E872 Acidosis, unspecified: Secondary | ICD-10-CM | POA: Diagnosis not present

## 2023-01-19 LAB — GLUCOSE, CAPILLARY
Glucose-Capillary: 108 mg/dL — ABNORMAL HIGH (ref 70–99)
Glucose-Capillary: 116 mg/dL — ABNORMAL HIGH (ref 70–99)
Glucose-Capillary: 118 mg/dL — ABNORMAL HIGH (ref 70–99)
Glucose-Capillary: 118 mg/dL — ABNORMAL HIGH (ref 70–99)
Glucose-Capillary: 140 mg/dL — ABNORMAL HIGH (ref 70–99)
Glucose-Capillary: 97 mg/dL (ref 70–99)
Glucose-Capillary: 98 mg/dL (ref 70–99)

## 2023-01-19 LAB — COMPREHENSIVE METABOLIC PANEL
ALT: 55 U/L — ABNORMAL HIGH (ref 0–44)
AST: 16 U/L (ref 15–41)
Albumin: 2.1 g/dL — ABNORMAL LOW (ref 3.5–5.0)
Alkaline Phosphatase: 70 U/L (ref 38–126)
Anion gap: 5 (ref 5–15)
BUN: 32 mg/dL — ABNORMAL HIGH (ref 8–23)
CO2: 26 mmol/L (ref 22–32)
Calcium: 7.7 mg/dL — ABNORMAL LOW (ref 8.9–10.3)
Chloride: 107 mmol/L (ref 98–111)
Creatinine, Ser: 1.43 mg/dL — ABNORMAL HIGH (ref 0.61–1.24)
GFR, Estimated: 46 mL/min — ABNORMAL LOW (ref >60)
Glucose, Bld: 102 mg/dL — ABNORMAL HIGH (ref 70–99)
Potassium: 3.4 mmol/L — ABNORMAL LOW (ref 3.5–5.1)
Sodium: 138 mmol/L (ref 135–145)
Total Bilirubin: 0.7 mg/dL (ref 0.3–1.2)
Total Protein: 4.9 g/dL — ABNORMAL LOW (ref 6.5–8.1)

## 2023-01-19 LAB — CBC WITH DIFFERENTIAL/PLATELET
Abs Immature Granulocytes: 0.08 10*3/uL — ABNORMAL HIGH (ref 0.00–0.07)
Basophils Absolute: 0 10*3/uL (ref 0.0–0.1)
Basophils Relative: 0 %
Eosinophils Absolute: 0.1 10*3/uL (ref 0.0–0.5)
Eosinophils Relative: 1 %
HCT: 31 % — ABNORMAL LOW (ref 39.0–52.0)
Hemoglobin: 9.8 g/dL — ABNORMAL LOW (ref 13.0–17.0)
Immature Granulocytes: 1 %
Lymphocytes Relative: 9 %
Lymphs Abs: 1 10*3/uL (ref 0.7–4.0)
MCH: 30.4 pg (ref 26.0–34.0)
MCHC: 31.6 g/dL (ref 30.0–36.0)
MCV: 96.3 fL (ref 80.0–100.0)
Monocytes Absolute: 0.3 10*3/uL (ref 0.1–1.0)
Monocytes Relative: 3 %
Neutro Abs: 9.8 10*3/uL — ABNORMAL HIGH (ref 1.7–7.7)
Neutrophils Relative %: 86 %
Platelets: 115 10*3/uL — ABNORMAL LOW (ref 150–400)
RBC: 3.22 MIL/uL — ABNORMAL LOW (ref 4.22–5.81)
RDW: 14.1 % (ref 11.5–15.5)
WBC: 11.3 10*3/uL — ABNORMAL HIGH (ref 4.0–10.5)
nRBC: 0 % (ref 0.0–0.2)

## 2023-01-19 LAB — MAGNESIUM: Magnesium: 2.1 mg/dL (ref 1.7–2.4)

## 2023-01-19 MED ORDER — METOPROLOL SUCCINATE ER 25 MG PO TB24
12.5000 mg | ORAL_TABLET | Freq: Every day | ORAL | Status: DC
  Administered 2023-01-19 – 2023-01-29 (×11): 12.5 mg via ORAL
  Filled 2023-01-19 (×12): qty 1

## 2023-01-19 MED ORDER — ORAL CARE MOUTH RINSE
15.0000 mL | OROMUCOSAL | Status: DC
  Administered 2023-01-19 – 2023-01-29 (×37): 15 mL via OROMUCOSAL

## 2023-01-19 MED ORDER — ORAL CARE MOUTH RINSE: 15.0000 mL | OROMUCOSAL | Status: DC | PRN

## 2023-01-19 MED ORDER — MEMANTINE HCL 10 MG PO TABS
10.0000 mg | ORAL_TABLET | Freq: Two times a day (BID) | ORAL | Status: DC
  Administered 2023-01-19 – 2023-01-29 (×20): 10 mg via ORAL
  Filled 2023-01-19 (×22): qty 1

## 2023-01-19 MED ORDER — LEVOTHYROXINE SODIUM 25 MCG PO TABS
25.0000 ug | ORAL_TABLET | Freq: Every day | ORAL | Status: DC
  Administered 2023-01-20 – 2023-01-27 (×8): 25 ug via ORAL
  Filled 2023-01-19 (×8): qty 1

## 2023-01-19 NOTE — Progress Notes (Signed)
Speech Language Pathology Treatment: Dysphagia  Patient Details Name: Devin Vance MRN: [id number] DOB: 1928-10-01 Today's Date: 01/19/2023 Time: 8032-1224 SLP Time Calculation (min) (ACUTE ONLY): 15 min  Assessment / Plan / Recommendation Clinical Impression   Patient seen by SLP for skilled treatment focusing on family education following MBS this morning. Patient's spouse and daughter were both present in room. SLP informed them of patient's primary oral phase dysphagia but good pharyngeal phase and no observed penetration or aspiration. Spouse and daughter both report that patient takes a very long time when eating/being fed. (This is consistent with findings on MBS) SLP recommended they focus on liquids and to maximize nutrition (consider supplement shakes, etc). Patient is safe to have puree solids, however prolonged oral transit will lead to fatigue and reduced overall PO intake. Spouse inquired about giving patient crumbled graham crackers in pudding which they have done with him previously. SLP advised to hold off on this for now but plan to trial solids at bedside. Spouse and daughter had no further questions. SLP will continue to follow patient.     HPI HPI: 89 y.o. male with medical history significant for Alzheimer's dementia, CAD, CKD stage IIIb, COPD, rheumatoid arthritis, HTN, HLD, prostate cancer, OSA not using CPAP, recent hospitalization from 01/02/2023-12/07/2022 with COVID-19 infection treated with Paxlovid and oral steroids presented with worsening abdominal pain and nausea along with fever.  On presentation, WBC was 41, creatinine 1.58, AST 98, ALT 207, alkaline phosphatase 124, total bili of 2, lipase 24, lactic acid 3.4.  COVID-19 PCR positive.  Chest x-ray showed low lung volumes without focal consolidation, edema or effusion.  CT of abdomen/pelvis with contrast showed cholelithiasis with acute cholecystitis.  General surgery was consulted.  He was started on IV fluids and  antibiotics.  Family denies patient having diagnosis of Parkinson's but do report that he participates in class that helps him with physical normal ability and and voiced drink.  They report his ambulation has diminished and his voice has weakened some since COVID.  Daughter reports patient with occasional cough with intake before admission, but wife denies this occurring.      SLP Plan  Continue with current plan of care      Recommendations for follow up therapy are one component of a multi-disciplinary discharge planning process, led by the attending physician.  Recommendations may be updated based on patient status, additional functional criteria and insurance authorization.    Recommendations  Diet recommendations: Thin liquid Liquids provided via: Cup Medication Administration: Crushed with puree Supervision: Trained caregiver to feed patient;Full supervision/cueing for compensatory strategies Compensations: Slow rate;Small sips/bites;Minimize environmental distractions Postural Changes and/or Swallow Maneuvers: Seated upright 90 degrees                  Oral care BID   Frequent or constant Supervision/Assistance Dysphagia, oral phase (R13.11)     Continue with current plan of care     Angela Nevin, MA, CCC-SLP Speech Therapy

## 2023-01-19 NOTE — Progress Notes (Signed)
PROGRESS NOTE    Devin Vance  ZOX:[id number] DOB: Apr 15, 1928 DOA: 01/15/2023 PCP: Georgann Housekeeper, MD   Brief Narrative:  89 y.o. male with medical history significant for Alzheimer's dementia, CAD, CKD stage IIIb, COPD, rheumatoid arthritis, HTN, HLD, prostate cancer, OSA not using CPAP, recent hospitalization from 01/02/2023-12/07/2022 with COVID-19 infection treated with Paxlovid and oral steroids presented with worsening abdominal pain and nausea along with fever.  On presentation, WBC was 41, creatinine 1.58, AST 98, ALT 207, alkaline phosphatase 124, total bili of 2, lipase 24, lactic acid 3.4.  COVID-19 PCR positive.  Chest x-ray showed low lung volumes without focal consolidation, edema or effusion.  CT of abdomen/pelvis with contrast showed cholelithiasis with acute cholecystitis.  General surgery was consulted.  He was started on IV fluids and antibiotics.  He underwent percutaneous cholecystostomy tube placement by IR on 01/16/2023.  Assessment & Plan:   Severe sepsis: Present on admission Acute calculus cholecystitis Elevated LFTs Lactic acidosis -Presented with significant leukocytosis, tachycardia, tachypnea, lactic acidosis and CT findings consistent with acute calculus cholecystitis -Continue Zosyn.  General surgery signed off on 01/18/2023.  Currently on for liquid diet.  Status post IR guided percutaneous cholecystostomy tube placement on 01/16/2023.  Continue pain management and IV fluids.  Oral intake is still poor.  Cultures negative so far -Monitor LFTs: Improving  Leukocytosis -Improving, 11.3 today  CKD stage IIIb -monitor creatinine.  Creatinine close to baseline  COVID-19 positive -Initially positive 01/02/2023 and treated with Paxlovid. This is a persistent positive and clinically nonsignificant.   CAD -Stable.  Aspirin on hold  Thrombocytopenia -No signs of bleeding.  Monitor.  Hypertension -Monitor blood pressure.  Antihypertensives on hold for now.  Blood  pressures intermittently elevated.  Might have to resume antihypertensives.  Alzheimer's dementia Acute metabolic encephalopathy -Namenda, Provigil, Seroquel on hold for now -Mental status worsened because of ongoing infection.  Improving slightly.  Still extremely slow to respond.  Dysphagia -SLP following.  Currently on full liquid diet.  Possible MBS today.  Rheumatoid arthritis -Plaquenil on hold for now  Hypothyroidism--resume Synthroid when taking orals  OSA -Does not use CPAP  Goals of care -Overall prognosis is guarded to poor.  Palliative care following  DVT prophylaxis: SCDs Code Status: DNR Family Communication: Daughter and wife at bedside  Disposition Plan: Status is: Inpatient Remains inpatient appropriate because: Of severity of illness  Consultants: General surgery.  IR.  Palliative care  Procedures: As above  Antimicrobials: Zosyn from 01/15/23 onwards   Subjective: Patient seen and examined at bedside.  No agitation, seizures, fever or vomiting reported.  Oral intake is still poor. Objective: Vitals:   01/18/23 1800 01/18/23 2009 01/19/23 0416 01/19/23 0606  BP:  133/74 (!) 146/72   Pulse: 81 82 77   Resp: (!) 22 (!) 21 20   Temp:  98 F (36.7 C) 97.8 F (36.6 C)   TempSrc:  Axillary Axillary   SpO2: 98% 100% 98%   Weight:    94.6 kg  Height:        Intake/Output Summary (Last 24 hours) at 01/19/2023 0819 Last data filed at 01/19/2023 0600 Gross per 24 hour  Intake 1616.02 ml  Output 1450 ml  Net 166.02 ml    Filed Weights   01/16/23 0434 01/18/23 1215 01/19/23 0606  Weight: 96.4 kg 92 kg 94.6 kg    Examination:  General: Still on 3 L oxygen via nasal cannula intermittently.  No distress.  Looks chronically ill and deconditioned.  Elderly male  lying in bed. ENT/neck: No palpable thyromegaly or JVD elevation noted  respiratory: Decreased breath sounds at bases bilaterally with some crackles  CVS: S1 and S2 heard; rate controlled  currently  abdominal: Soft, mildly tender in the right upper quadrant; slightly distended; no organomegaly, normal bowel sounds heard.  Right upper quadrant percutaneous tube present Extremities: Trace lower extremity edema present; no cyanosis  CNS: Wakes up slightly, still slow to respond; slightly confused; poor historian.  No focal neurologic deficit.  Moving extremities Lymph: No lymphadenopathy noted  skin: No obvious lesions/ecchymosis psych: Showing no signs of agitation.  Affect is extremely flat.   Musculoskeletal: No obvious joint swelling/deformity     Data Reviewed: I have personally reviewed following labs and imaging studies  CBC: Recent Labs  Lab 01/15/23 2108 01/16/23 0514 01/17/23 0522 01/18/23 0455 01/19/23 0532  WBC 41.0* 37.3* 30.2* 16.8* 11.3*  NEUTROABS 38.8*  --  27.8* 15.0* 9.8*  HGB 15.0 13.8 12.6* 11.2* 9.8*  HCT 46.5 43.8 39.3 35.9* 31.0*  MCV 93.0 92.8 94.0 96.8 96.3  PLT 290 210 170 129* 115*    Basic Metabolic Panel: Recent Labs  Lab 01/15/23 2108 01/16/23 0514 01/17/23 0522 01/18/23 0455 01/19/23 0532  NA 145 143 146* 144 138  K 4.9 4.6 4.2 3.7 3.4*  CL 110 109 113* 111 107  CO2 GLUCOSE 248* 157* 140* 118* 102*  BUN 40* 35* 40* 37* 32*  CREATININE 1.58* 1.43* 1.62* 1.68* 1.43*  CALCIUM 9.1 8.5* 8.3* 8.1* 7.7*  MG  --   --  2.5* 2.3 2.1    GFR: Estimated Creatinine Clearance: 37.2 mL/min (A) (by C-G formula based on SCr of 1.43 mg/dL (H)). Liver Function Tests: Recent Labs  Lab 01/15/23 2108 01/16/23 0514 01/17/23 0522 01/18/23 0455 01/19/23 0532  AST 98* 68* ALT 207* 171* 107* 78* 55*  ALKPHOS 124 103 95 78 70  BILITOT 2.0* 1.7* 1.2 0.9 0.7  PROT 6.8 5.9* 5.2* 5.2* 4.9*  ALBUMIN 3.5 2.9* 2.4* 2.2* 2.1*    Recent Labs  Lab 01/15/23 2108  LIPASE 24    No results for input(s): "AMMONIA" in the last 168 hours. Coagulation Profile: Recent Labs  Lab 01/15/23 2108  INR 1.1    Cardiac  Enzymes: No results for input(s): "CKTOTAL", "CKMB", "CKMBINDEX", "TROPONINI" in the last 168 hours. BNP (last 3 results) No results for input(s): "PROBNP" in the last 8760 hours. HbA1C: No results for input(s): "HGBA1C" in the last 72 hours.  CBG: Recent Labs  Lab 01/18/23 1618 01/18/23 2005 01/19/23 0001 01/19/23 0406 01/19/23 0745  GLUCAP 151* 124* 140* 118* 98    Lipid Profile: No results for input(s): "CHOL", "HDL", "LDLCALC", "TRIG", "CHOLHDL", "LDLDIRECT" in the last 72 hours. Thyroid Function Tests: No results for input(s): "TSH", "T4TOTAL", "FREET4", "T3FREE", "THYROIDAB" in the last 72 hours. Anemia Panel: No results for input(s): "VITAMINB12", "FOLATE", "FERRITIN", "TIBC", "IRON", "RETICCTPCT" in the last 72 hours. Sepsis Labs: Recent Labs  Lab 01/15/23 2108 01/15/23 2353 01/16/23 0514  PROCALCITON  --   --  1.49  LATICACIDVEN 3.4* 4.8* 2.0*     Recent Results (from the past 240 hour(s))  Blood Culture (routine x 2)     Status: None (Preliminary result)   Collection Time: 01/15/23  9:55 PM   Specimen: BLOOD  Result Value Ref Range Status   Specimen Description   Final    BLOOD BLOOD RIGHT FOREARM Performed at [facility],  [address] [place], [place] [address]    Special Requests   Final    BOTTLES DRAWN AEROBIC AND ANAEROBIC Blood Culture adequate volume Performed at [facility], [address] [place], [place] [address]    Culture   Final    NO GROWTH 3 DAYS Performed at [facility], [address] [place], [place] [address]    Report Status PENDING  Incomplete  Blood Culture (routine x 2)     Status: None (Preliminary result)   Collection Time: 01/15/23 10:00 PM   Specimen: BLOOD  Result Value Ref Range Status   Specimen Description   Final    BLOOD RIGHT ANTECUBITAL Performed at [facility], [address] [place], [place] [address]    Special Requests   Final     BOTTLES DRAWN AEROBIC AND ANAEROBIC Blood Culture adequate volume Performed at [facility], [address] [place], [place] [address]    Culture   Final    NO GROWTH 3 DAYS Performed at [facility], [address] [place], [place] [address]    Report Status PENDING  Incomplete  Resp panel by RT-PCR (RSV, Flu A&B, Covid) Anterior Nasal Swab     Status: Abnormal   Collection Time: 01/15/23 10:17 PM   Specimen: Anterior Nasal Swab  Result Value Ref Range Status   SARS Coronavirus 2 by RT PCR POSITIVE (A) NEGATIVE Final    Comment: (NOTE) SARS-CoV-2 target nucleic acids are DETECTED.  The SARS-CoV-2 RNA is generally detectable in upper respiratory specimens during the acute phase of infection. Positive results are indicative of the presence of the identified virus, but do not rule out bacterial infection or co-infection with other pathogens not detected by the test. Clinical correlation with patient history and other diagnostic information is necessary to determine patient infection status. The expected result is Negative.  Fact Sheet for Patients: BloggerCourse.comhttps://www.fda.gov/media/152166/download  Fact Sheet for Healthcare Providers: SeriousBroker.ithttps://www.fda.gov/media/152162/download  This test is not yet approved or cleared by the [place] FDA and  has been authorized for detection and/or diagnosis of SARS-CoV-2 by FDA under an Emergency Use Authorization (EUA).  This EUA will remain in effect (meaning this test can be used) for the duration of  the COVID-19 declaration under Section 564(b)(1) of the A ct, 21 U.S.C. section 360bbb-3(b)(1), unless the authorization is terminated or revoked sooner.     Influenza A by PCR NEGATIVE NEGATIVE Final   Influenza B by PCR NEGATIVE NEGATIVE Final    Comment: (NOTE) The Xpert Xpress SARS-CoV-2/FLU/RSV plus assay is intended as an aid in the diagnosis of influenza from Nasopharyngeal swab specimens and should not be  used as a sole basis for treatment. Nasal washings and aspirates are unacceptable for Xpert Xpress SARS-CoV-2/FLU/RSV testing.  Fact Sheet for Patients: BloggerCourse.comhttps://www.fda.gov/media/152166/download  Fact Sheet for Healthcare Providers: SeriousBroker.ithttps://www.fda.gov/media/152162/download  This test is not yet approved or cleared by the [place] FDA and has been authorized for detection and/or diagnosis of SARS-CoV-2 by FDA under an Emergency Use Authorization (EUA). This EUA will remain in effect (meaning this test can be used) for the duration of the COVID-19 declaration under Section 564(b)(1) of the Act, 21 U.S.C. section 360bbb-3(b)(1), unless the authorization is terminated or revoked.     Resp Syncytial Virus by PCR NEGATIVE NEGATIVE Final    Comment: (NOTE) Fact Sheet for Patients: BloggerCourse.comhttps://www.fda.gov/media/152166/download  Fact Sheet for Healthcare Providers: SeriousBroker.ithttps://www.fda.gov/media/152162/download  This test is not yet approved or cleared  by the [place] and has been authorized for detection and/or diagnosis of SARS-CoV-2 by FDA under an Emergency Use Authorization (EUA). This EUA will remain in effect (meaning this test can be used) for the duration of the COVID-19 declaration under Section 564(b)(1) of the Act, 21 U.S.C. section 360bbb-3(b)(1), unless the authorization is terminated or revoked.  Performed at [facility], [address] [place], [place] [address]   Aerobic/Anaerobic Culture w Gram Stain (surgical/deep wound)     Status: None (Preliminary result)   Collection Time: 01/16/23  3:12 PM   Specimen: BILE  Result Value Ref Range Status   Specimen Description   Final    BILE Performed at [facility], [address] [place], [place] [address]    Special Requests   Final    NONE Performed at [facility], [address], [place], [place] [address]    Gram Stain NO WBC SEEN NO ORGANISMS  SEEN   Final   Culture   Final    NO GROWTH 2 DAYS NO ANAEROBES ISOLATED; CULTURE IN PROGRESS FOR 5 DAYS Performed at [facility], [address], [place], [place] [address]    Report Status PENDING  Incomplete         Radiology Studies: No results found.      Scheduled Meds:  insulin aspart  0-6 Units Subcutaneous Q4H   mouth rinse  15 mL Mouth Rinse 4 times per day   sodium chloride flush  3 mL Intravenous Q12H   sodium chloride flush  5 mL Intracatheter Q8H   Continuous Infusions:  dextrose 5 % and 0.45% NaCl 75 mL/hr at 01/18/23 2319   piperacillin-tazobactam (ZOSYN)  IV 3.375 g (01/19/23 0309)          Glade Lloyd, MD [facility] 01/19/2023, 8:19 AM

## 2023-01-19 NOTE — Progress Notes (Signed)
Modified Barium Swallow Study  Patient Details  Name: Devin Vance MRN: [id number] Date of Birth: 08-06-1928  Today's Date: 01/19/2023  Modified Barium Swallow completed.  Full report located under Chart Review in the Imaging Section.  History of Present Illness 89 y.o. male with medical history significant for Alzheimer's dementia, CAD, CKD stage IIIb, COPD, rheumatoid arthritis, HTN, HLD, prostate cancer, OSA not using CPAP, recent hospitalization from 01/02/2023-12/07/2022 with COVID-19 infection treated with Paxlovid and oral steroids presented with worsening abdominal pain and nausea along with fever.  On presentation, WBC was 41, creatinine 1.58, AST 98, ALT 207, alkaline phosphatase 124, total bili of 2, lipase 24, lactic acid 3.4.  COVID-19 PCR positive.  Chest x-ray showed low lung volumes without focal consolidation, edema or effusion.  CT of abdomen/pelvis with contrast showed cholelithiasis with acute cholecystitis.  General surgery was consulted.  He was started on IV fluids and antibiotics.  Family denies patient having diagnosis of Parkinson's but do report that he participates in class that helps him with physical normal ability and and voiced drink.  They report his ambulation has diminished and his voice has weakened some since COVID.  Daughter reports patient with occasional cough with intake before admission, but wife denies this occurring.   Clinical Impression Patient presents with a moderately impaired oral phase of swallow but with pharyngeal phase appearing functional for his age. Swallow was initiated at level of vallecular sinus, only trace amount of barium observed sitting above PES after majority of liquid and solid swallows. Patient exhibited good airway protection, with full epiglottic inversion, no penetration or aspiration observed with any of the tested consistencies. No significant pharyngeal retention of barium observed. During oral phase, patient exhibited significantly  delayed anterior to posterior transit, reduced bolus cohesion within oral cavity and PO residuals remaining on tongue, base of tongue and floor of mouth. He had the most difficulty with thicker consistencies such as honey thick liquids and solids. SLP recommending to continue with full liquids and will plan for advanced solids trials at bedside. Factors that may increase risk of adverse event in presence of aspiration [employer] 2021): Poor general health and/or compromised immunity;Reduced cognitive function;Limited mobility;Frail or deconditioned;Dependence for feeding and/or oral hygiene;Weak cough  Swallow Evaluation Recommendations Recommendations: PO diet PO Diet Recommendation: Full liquid diet;Thin liquids (Level 0) Liquid Administration via: Cup;Straw;Spoon Medication Administration: Crushed with puree Supervision: Full assist for feeding;Full supervision/cueing for swallowing strategies Swallowing strategies  : Slow rate;Small bites/sips;Check for pocketing or oral holding Postural changes: Position pt fully upright for meals Oral care recommendations: Oral care BID (2x/day);Oral care before PO      Angela Nevin, MA, CCC-SLP Speech Therapy

## 2023-01-19 NOTE — Progress Notes (Signed)
Daily Progress Note   Patient Name: Devin Vance       Date: 01/19/2023 DOB: 03/21/1928  Age: 89 y.o. MRN#: [id number] Attending Physician: Glade LloydAlekh, Kshitiz, MD Primary Care Physician: Georgann HousekeeperHusain, Karrar, MD Admit Date: 01/15/2023  Reason for Consultation/Follow-up: Establishing goals of care  Subjective:  Patient is awake alert, MBS study done, advanced to full liquids, RN in the room, patient is tolerating PO with assistance, he participated with PT on 01-18-23.      Length of Stay: 4  Current Medications: Scheduled Meds:   insulin aspart  0-6 Units Subcutaneous Q4H   [START ON 01/20/2023] levothyroxine  25 mcg Oral Q0600   memantine  10 mg Oral BID   metoprolol succinate  12.5 mg Oral Daily   mouth rinse  15 mL Mouth Rinse 4 times per day   sodium chloride flush  3 mL Intravenous Q12H   sodium chloride flush  5 mL Intracatheter Q8H    Continuous Infusions:  dextrose 5 % and 0.45% NaCl 50 mL/hr at 01/19/23 1109   piperacillin-tazobactam (ZOSYN)  IV 3.375 g (01/19/23 0930)    PRN Meds: acetaminophen **OR** acetaminophen, ipratropium-albuterol, morphine injection, ondansetron **OR** ondansetron (ZOFRAN) IV, mouth rinse  Physical Exam         Awake alert Attempts to verbalize Follows some commands No distress Has RUQ drain  Vital Signs: BP 137/64 (BP Location: Left Arm)   Pulse 73   Temp 97.6 F (36.4 C) (Oral)   Resp 14   Ht 5\' 10"  (1.778 m)   Wt 94.6 kg   SpO2 91%   BMI 29.92 kg/m  SpO2: SpO2: 91 % O2 Device: O2 Device: Nasal Cannula O2 Flow Rate: O2 Flow Rate (L/min): 3 L/min  Intake/output summary:  Intake/Output Summary (Last 24 hours) at 01/19/2023 1223 Last data filed at 01/19/2023 0600 Gross per 24 hour  Intake 1616.02 ml  Output 1450 ml  Net 166.02 ml    LBM:  Last BM Date :  (UTA) Baseline Weight: Weight: 90.7 kg Most recent weight: Weight: 94.6 kg       Palliative Assessment/Data:      Patient Active Problem List   Diagnosis Date Noted   Acute calculous cholecystitis 01/16/2023   Hyperglycemia 01/16/2023   Coronary artery disease 01/16/2023   Hypothyroidism 01/16/2023   Prostate cancer  01/15/2023   Polymyalgia rheumatica 01/15/2023   Severe sepsis with lactic acidosis 01/15/2023   Lab test positive for detection of COVID-19 virus 01/02/2023   AAA (abdominal aortic aneurysm) 01/02/2023   Dilation of stomach 01/02/2023   Chronic diarrhea 01/02/2023   Alzheimer's dementia with agitation 06/02/2022   AMS (altered mental status) 02/16/2021   Gait abnormality 07/15/2019   Cardiomyopathy 03/24/2019   COPD (chronic obstructive pulmonary disease) 03/24/2019   HLD (hyperlipidemia) 03/24/2019   Acute renal failure superimposed on stage 3 chronic kidney disease 10/28/2018   Chronic kidney disease, stage 3b 10/28/2018   Rheumatoid arthritis involving multiple sites 10/28/2018   Dementia with behavioral disturbance 10/27/2018   Alzheimer's dementia with behavioral disturbance 07/31/2018   Essential hypertension 08/15/2017   Anemia, mild 02/12/2017   Olecranon bursitis of right elbow 02/12/2017   Kyphosis of cervical region 10/26/2015   Intermittent alternating esotropia 05/18/2015   GERD (gastroesophageal reflux disease) 01/22/2014   Angina pectoris 01/22/2014    Palliative Care Assessment & Plan   Patient Profile:    Assessment:  Devin Vance is a 89 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 He has a history of Alzheimer's dementia for the past 5 to 6 years.  He lives at [facility] independent living facility.  He has a recent hospitalization for COVID.  He has been admitted to hospital medicine service with the surgical colleagues and vaginal radiology colleagues following  for cholecystitis for which he underwent percutaneous cholecystostomy tube placement on 01-16-2023.  Hospital course complicated by altered mental status and patient not awake alert enough to have a meaningful oral intake of clear liquids which has been prescribed. Palliative consult for ongoing goals of care discussions has been requested. Chart reviewed, patient seen and examined, discussed with family present at bedside.  Patient has past medical history of Alzheimer's dementia obstructive sleep apnea stage III chronic kidney disease hypertension GERD coronary artery disease and history of prostate cancer. He has a had the cholecystostomy tube placement, he has had gradual progressive functional and cognitive decline from a dementia standpoint even prior to this gallbladder episode and prior to recent COVID hospitalization as well.  Generally at baseline, he is wheelchair-bound and only gives one-word answers.    Recommendations/Plan:   Monitor PO intake and participation with PT OT and SLP Family considering SNF rehab with palliative, if he is deemed to be eligible.    Code Status:    Code Status Orders  (From admission, onward)           Start     Ordered   01/16/23 0019  Do not attempt resuscitation (DNR)  Continuous       Question Answer Comment  If patient has no pulse and is not breathing Do Not Attempt Resuscitation   If patient has a pulse and/or is breathing: Medical Treatment Goals LIMITED ADDITIONAL INTERVENTIONS: Use medication/IV fluids and cardiac monitoring as indicated; Do not use intubation or mechanical ventilation (DNI), also provide comfort medications.  Transfer to Progressive/Stepdown as indicated, avoid Intensive Care.   Consent: Discussion documented in EHR or advanced directives reviewed      01/16/23 0020           Code Status History     Date Active Date Inactive Code Status Order ID Comments User Context   01/02/2023 0818 01/05/2023 1848 Full Code  [id number]  Bobette Mo, MD ED       Prognosis:  Unable to determine  Discharge Planning: Skilled Nursing Facility for rehab with Palliative care service follow-up  Care plan was discussed with RN  Thank you for allowing the Palliative Medicine Team to assist in the care of this patient. Low MDM.      Greater than 50%  of this time was spent counseling and coordinating care related to the above assessment and plan.  Rosalin Hawking, MD  Please contact Palliative Medicine Team phone at [phone] for questions and concerns.

## 2023-01-19 NOTE — Progress Notes (Signed)
OT Cancellation Note  Patient Details Name: Devin Vance MRN: [id number] DOB: 29-Jul-1928   Cancelled Treatment:    Reason Eval/Treat Not Completed: Other (comment)  Pt getting ready to get a bath with nursing.  Spoke with daughter.  Pt will need increase level of care ( SNF or increased A at ALF) upon DC.  Will follow up with pt next day  Alba Cory 01/19/2023, 3:45 PM

## 2023-01-20 DIAGNOSIS — E872 Acidosis, unspecified: Secondary | ICD-10-CM | POA: Diagnosis not present

## 2023-01-20 DIAGNOSIS — A419 Sepsis, unspecified organism: Secondary | ICD-10-CM | POA: Diagnosis not present

## 2023-01-20 DIAGNOSIS — K8000 Calculus of gallbladder with acute cholecystitis without obstruction: Secondary | ICD-10-CM | POA: Diagnosis not present

## 2023-01-20 DIAGNOSIS — R6520 Severe sepsis without septic shock: Secondary | ICD-10-CM | POA: Diagnosis not present

## 2023-01-20 LAB — COMPREHENSIVE METABOLIC PANEL
ALT: 43 U/L (ref 0–44)
AST: 13 U/L — ABNORMAL LOW (ref 15–41)
Albumin: 2.2 g/dL — ABNORMAL LOW (ref 3.5–5.0)
Alkaline Phosphatase: 81 U/L (ref 38–126)
Anion gap: 4 — ABNORMAL LOW (ref 5–15)
BUN: 25 mg/dL — ABNORMAL HIGH (ref 8–23)
CO2: 25 mmol/L (ref 22–32)
Calcium: 7.8 mg/dL — ABNORMAL LOW (ref 8.9–10.3)
Chloride: 109 mmol/L (ref 98–111)
Creatinine, Ser: 1.35 mg/dL — ABNORMAL HIGH (ref 0.61–1.24)
GFR, Estimated: 49 mL/min — ABNORMAL LOW (ref >60)
Glucose, Bld: 111 mg/dL — ABNORMAL HIGH (ref 70–99)
Potassium: 3.3 mmol/L — ABNORMAL LOW (ref 3.5–5.1)
Sodium: 138 mmol/L (ref 135–145)
Total Bilirubin: 1 mg/dL (ref 0.3–1.2)
Total Protein: 5.1 g/dL — ABNORMAL LOW (ref 6.5–8.1)

## 2023-01-20 LAB — GLUCOSE, CAPILLARY
Glucose-Capillary: 116 mg/dL — ABNORMAL HIGH (ref 70–99)
Glucose-Capillary: 101 mg/dL — ABNORMAL HIGH (ref 70–99)
Glucose-Capillary: 113 mg/dL — ABNORMAL HIGH (ref 70–99)
Glucose-Capillary: 124 mg/dL — ABNORMAL HIGH (ref 70–99)
Glucose-Capillary: 109 mg/dL — ABNORMAL HIGH (ref 70–99)

## 2023-01-20 LAB — CBC WITH DIFFERENTIAL/PLATELET
Abs Immature Granulocytes: 0.03 10*3/uL (ref 0.00–0.07)
Basophils Absolute: 0 10*3/uL (ref 0.0–0.1)
Basophils Relative: 0 %
Eosinophils Absolute: 0.2 10*3/uL (ref 0.0–0.5)
Eosinophils Relative: 2 %
HCT: 31.5 % — ABNORMAL LOW (ref 39.0–52.0)
Hemoglobin: 10 g/dL — ABNORMAL LOW (ref 13.0–17.0)
Immature Granulocytes: 0 %
Lymphocytes Relative: 11 %
Lymphs Abs: 1 10*3/uL (ref 0.7–4.0)
MCH: 30 pg (ref 26.0–34.0)
MCHC: 31.7 g/dL (ref 30.0–36.0)
MCV: 94.6 fL (ref 80.0–100.0)
Monocytes Absolute: 0.3 10*3/uL (ref 0.1–1.0)
Monocytes Relative: 3 %
Neutro Abs: 7.5 10*3/uL (ref 1.7–7.7)
Neutrophils Relative %: 84 %
Platelets: 121 10*3/uL — ABNORMAL LOW (ref 150–400)
RBC: 3.33 MIL/uL — ABNORMAL LOW (ref 4.22–5.81)
RDW: 13.6 % (ref 11.5–15.5)
WBC: 9.1 10*3/uL (ref 4.0–10.5)
nRBC: 0 % (ref 0.0–0.2)

## 2023-01-20 LAB — CULTURE, BLOOD (ROUTINE X 2)
Culture: NO GROWTH
Culture: NO GROWTH
Special Requests: ADEQUATE
Special Requests: ADEQUATE

## 2023-01-20 LAB — MAGNESIUM: Magnesium: 1.9 mg/dL (ref 1.7–2.4)

## 2023-01-20 MED ORDER — POLYETHYLENE GLYCOL 3350 17 G PO PACK: 17.0000 g | PACK | Freq: Every day | ORAL | Status: DC | PRN

## 2023-01-20 MED ORDER — SENNOSIDES-DOCUSATE SODIUM 8.6-50 MG PO TABS
1.0000 | ORAL_TABLET | Freq: Two times a day (BID) | ORAL | Status: DC
  Administered 2023-01-20 – 2023-01-29 (×8): 1 via ORAL
  Filled 2023-01-20 (×10): qty 1

## 2023-01-20 NOTE — Progress Notes (Signed)
PROGRESS NOTE    Devin Vance  STM:[id number] DOB: 1927/11/09 DOA: 01/15/2023 PCP: Georgann Housekeeper, MD   Brief Narrative:  89 y.o. male with medical history significant for Alzheimer's dementia, CAD, CKD stage IIIb, COPD, rheumatoid arthritis, HTN, HLD, prostate cancer, OSA not using CPAP, recent hospitalization from 01/02/2023-12/07/2022 with COVID-19 infection treated with Paxlovid and oral steroids presented with worsening abdominal pain and nausea along with fever.  On presentation, WBC was 41, creatinine 1.58, AST 98, ALT 207, alkaline phosphatase 124, total bili of 2, lipase 24, lactic acid 3.4.  COVID-19 PCR positive.  Chest x-ray showed low lung volumes without focal consolidation, edema or effusion.  CT of abdomen/pelvis with contrast showed cholelithiasis with acute cholecystitis.  General surgery was consulted.  He was started on IV fluids and antibiotics.  He underwent percutaneous cholecystostomy tube placement by IR on 01/16/2023.  Palliative care was also consulted.  Assessment & Plan:   Severe sepsis: Present on admission Acute calculus cholecystitis Elevated LFTs Lactic acidosis -Presented with significant leukocytosis, tachycardia, tachypnea, lactic acidosis and CT findings consistent with acute calculus cholecystitis -Continue Zosyn.  General surgery signed off on 01/18/2023.  Currently on full liquid diet.  Status post IR guided percutaneous cholecystostomy tube placement on 01/16/2023.  Continue pain management and IV fluids.  Oral intake is still poor.  Cultures negative so far -Monitor LFTs: Improved  Leukocytosis -Resolved.  WBC 9.1 today  CKD stage IIIb -monitor creatinine.  Creatinine at baseline  COVID-19 positive -Initially positive 01/02/2023 and treated with Paxlovid. This is a persistent positive and clinically nonsignificant.   CAD -Stable.  Aspirin on hold  Thrombocytopenia -No signs of bleeding.  Monitor.  Hypertension -Monitor blood pressure.  Blood  pressures intermittently elevated.  Metoprolol resumed on 01/19/2023  Alzheimer's dementia Acute metabolic encephalopathy - Provigil, Seroquel on hold for now -Mental status worsened because of ongoing infection.  Improving slightly.  Still extremely slow to respond.  Namenda resumed on 01/19/2023 after discussion with family.  Dysphagia -SLP following.  Currently on full liquid diet.    Rheumatoid arthritis -Plaquenil on hold for now  Hypothyroidism--continue Synthroid, resume from today  OSA -Does not use CPAP  Goals of care -Overall prognosis is guarded to poor.  Palliative care following  DVT prophylaxis: SCDs Code Status: DNR Family Communication: Daughter at bedside  Disposition Plan: Status is: Inpatient Remains inpatient appropriate because: Of severity of illness  Consultants: General surgery.  IR.  Palliative care  Procedures: As above  Antimicrobials: Zosyn from 01/15/23 onwards   Subjective: Patient seen and examined at bedside.  No fever, seizures, agitation or vomiting reported.  Oral intake remains poor.   Objective: Vitals:   01/19/23 1550 01/19/23 1933 01/20/23 0357 01/20/23 0500  BP: (!) 141/71 (!) 146/78 (!) 160/82   Pulse: 72 77 70   Resp: Temp: 97.6 F (36.4 C) 99 F (37.2 C) 98.6 F (37 C)   TempSrc: Oral Oral Oral   SpO2: 94% 97% 93%   Weight:    90.6 kg  Height:        Intake/Output Summary (Last 24 hours) at 01/20/2023 0813 Last data filed at 01/20/2023 0622 Gross per 24 hour  Intake 743 ml  Output 1900 ml  Net -1157 ml    Filed Weights   01/18/23 1215 01/19/23 0606 01/20/23 0500  Weight: 92 kg 94.6 kg 90.6 kg    Examination:  General: No acute distress.  On room air.  Looks chronically ill and  deconditioned.  Elderly male lying in bed. ENT/neck: No obvious JVD elevation or palpable neck masses noted  respiratory: Bilateral decreased breath sounds at bases with scattered crackles  CVS: Rate mostly controlled; S1 and S2  are heard  abdominal: Soft, mild tender in the upper quadrant; distended mildly; no organomegaly, bowel sounds are heard.  Right upper quadrant percutaneous tube present Extremities: No clubbing; mild bilateral lower extremity edema present  CNS: Still extremely slow to respond; slightly confused.  Poor historian.  No focal neurologic deficit.  Able to move extremities Lymph: No cervical lymphadenopathy noted  skin: No obvious rashes/petechiae  psych: Mostly flat affect; currently not agitated  musculoskeletal: No obvious joint swelling/deformity     Data Reviewed: I have personally reviewed following labs and imaging studies  CBC: Recent Labs  Lab 01/15/23 2108 01/16/23 0514 01/17/23 0522 01/18/23 0455 01/19/23 0532 01/20/23 0458  WBC 41.0* 37.3* 30.2* 16.8* 11.3* 9.1  NEUTROABS 38.8*  --  27.8* 15.0* 9.8* 7.5  HGB 15.0 13.8 12.6* 11.2* 9.8* 10.0*  HCT 46.5 43.8 39.3 35.9* 31.0* 31.5*  MCV 93.0 92.8 94.0 96.8 96.3 94.6  PLT 290 210 170 129* 115* 121*    Basic Metabolic Panel: Recent Labs  Lab 01/16/23 0514 01/17/23 0522 01/18/23 0455 01/19/23 0532 01/20/23 0458  NA 143 146* 144 138 138  K 4.6 4.2 3.7 3.4* 3.3*  CL 109 113* 111 107 109  CO2 27 25 27 26 25   GLUCOSE 157* 140* 118* 102* 111*  BUN 35* 40* 37* 32* 25*  CREATININE 1.43* 1.62* 1.68* 1.43* 1.35*  CALCIUM 8.5* 8.3* 8.1* 7.7* 7.8*  MG  --  2.5* 2.3 2.1 1.9    GFR: Estimated Creatinine Clearance: 38.7 mL/min (A) (by C-G formula based on SCr of 1.35 mg/dL (H)). Liver Function Tests: Recent Labs  Lab 01/16/23 0514 01/17/23 0522 01/18/23 0455 01/19/23 0532 01/20/23 0458  AST 68* 31 16 16  13*  ALT 171* 107* 78* 55* 43  ALKPHOS 103 95 78 70 81  BILITOT 1.7* 1.2 0.9 0.7 1.0  PROT 5.9* 5.2* 5.2* 4.9* 5.1*  ALBUMIN 2.9* 2.4* 2.2* 2.1* 2.2*    Recent Labs  Lab 01/15/23 2108  LIPASE 24    No results for input(s): "AMMONIA" in the last 168 hours. Coagulation Profile: Recent Labs  Lab  01/15/23 2108  INR 1.1    Cardiac Enzymes: No results for input(s): "CKTOTAL", "CKMB", "CKMBINDEX", "TROPONINI" in the last 168 hours. BNP (last 3 results) No results for input(s): "PROBNP" in the last 8760 hours. HbA1C: No results for input(s): "HGBA1C" in the last 72 hours.  CBG: Recent Labs  Lab 01/19/23 1546 01/19/23 1933 01/19/23 2353 01/20/23 0402 01/20/23 0747  GLUCAP 118* 97 116* 116* 101*    Lipid Profile: No results for input(s): "CHOL", "HDL", "LDLCALC", "TRIG", "CHOLHDL", "LDLDIRECT" in the last 72 hours. Thyroid Function Tests: No results for input(s): "TSH", "T4TOTAL", "FREET4", "T3FREE", "THYROIDAB" in the last 72 hours. Anemia Panel: No results for input(s): "VITAMINB12", "FOLATE", "FERRITIN", "TIBC", "IRON", "RETICCTPCT" in the last 72 hours. Sepsis Labs: Recent Labs  Lab 01/15/23 2108 01/15/23 2353 01/16/23 0514  PROCALCITON  --   --  1.49  LATICACIDVEN 3.4* 4.8* 2.0*     Recent Results (from the past 240 hour(s))  Blood Culture (routine x 2)     Status: None (Preliminary result)   Collection Time: 01/15/23  9:55 PM   Specimen: BLOOD  Result Value Ref Range Status   Specimen Description   Final  BLOOD BLOOD RIGHT FOREARM Performed at [facility], [address] [place], [place] [address]    Special Requests   Final    BOTTLES DRAWN AEROBIC AND ANAEROBIC Blood Culture adequate volume Performed at [facility], [address], [place], [place] [address]    Culture   Final    NO GROWTH 4 DAYS Performed at [facility], [address], [place], [place] [address]    Report Status PENDING  Incomplete  Blood Culture (routine x 2)     Status: None (Preliminary result)   Collection Time: 01/15/23 10:00 PM   Specimen: BLOOD  Result Value Ref Range Status   Specimen Description   Final    BLOOD RIGHT ANTECUBITAL Performed at [facility], [address] [place], [place]  [address]    Special Requests   Final    BOTTLES DRAWN AEROBIC AND ANAEROBIC Blood Culture adequate volume Performed at [facility], [address] [place], [place] [address]    Culture   Final    NO GROWTH 4 DAYS Performed at [facility], [address] [place], [place] [address]    Report Status PENDING  Incomplete  Resp panel by RT-PCR (RSV, Flu A&B, Covid) Anterior Nasal Swab     Status: Abnormal   Collection Time: 01/15/23 10:17 PM   Specimen: Anterior Nasal Swab  Result Value Ref Range Status   SARS Coronavirus 2 by RT PCR POSITIVE (A) NEGATIVE Final    Comment: (NOTE) SARS-CoV-2 target nucleic acids are DETECTED.  The SARS-CoV-2 RNA is generally detectable in upper respiratory specimens during the acute phase of infection. Positive results are indicative of the presence of the identified virus, but do not rule out bacterial infection or co-infection with other pathogens not detected by the test. Clinical correlation with patient history and other diagnostic information is necessary to determine patient infection status. The expected result is Negative.  Fact Sheet for Patients: BloggerCourse.com  Fact Sheet for Healthcare Providers: SeriousBroker.it  This test is not yet approved or cleared by the [place] FDA and  has been authorized for detection and/or diagnosis of SARS-CoV-2 by FDA under an Emergency Use Authorization (EUA).  This EUA will remain in effect (meaning this test can be used) for the duration of  the COVID-19 declaration under Section 564(b)(1) of the A ct, 21 U.S.C. section 360bbb-3(b)(1), unless the authorization is terminated or revoked sooner.     Influenza A by PCR NEGATIVE NEGATIVE Final   Influenza B by PCR NEGATIVE NEGATIVE Final    Comment: (NOTE) The Xpert Xpress SARS-CoV-2/FLU/RSV plus assay is intended as an aid in the diagnosis of influenza from  Nasopharyngeal swab specimens and should not be used as a sole basis for treatment. Nasal washings and aspirates are unacceptable for Xpert Xpress SARS-CoV-2/FLU/RSV testing.  Fact Sheet for Patients: BloggerCourse.com  Fact Sheet for Healthcare Providers: SeriousBroker.it  This test is not yet approved or cleared by the [place] FDA and has been authorized for detection and/or diagnosis of SARS-CoV-2 by FDA under an Emergency Use Authorization (EUA). This EUA will remain in effect (meaning this test can be used) for the duration of the COVID-19 declaration under Section 564(b)(1) of the Act, 21 U.S.C. section 360bbb-3(b)(1), unless the authorization is terminated or revoked.     Resp Syncytial Virus by PCR NEGATIVE NEGATIVE Final    Comment: (NOTE) Fact Sheet for Patients: BloggerCourse.com  Fact Sheet for Healthcare Providers:  SeriousBroker.it  This test is not yet approved or cleared by the [place] and has been authorized for detection and/or diagnosis of SARS-CoV-2 by FDA under an Emergency Use Authorization (EUA). This EUA will remain in effect (meaning this test can be used) for the duration of the COVID-19 declaration under Section 564(b)(1) of the Act, 21 U.S.C. section 360bbb-3(b)(1), unless the authorization is terminated or revoked.  Performed at [facility], [address] [place], [place] [address]   Aerobic/Anaerobic Culture w Gram Stain (surgical/deep wound)     Status: None (Preliminary result)   Collection Time: 01/16/23  3:12 PM   Specimen: BILE  Result Value Ref Range Status   Specimen Description   Final    BILE Performed at [facility], [address], [place], [place] [address]    Special Requests   Final    NONE Performed at [facility], [address], [place], [place]  [address]    Gram Stain NO WBC SEEN NO ORGANISMS SEEN   Final   Culture   Final    NO GROWTH 3 DAYS NO ANAEROBES ISOLATED; CULTURE IN PROGRESS FOR 5 DAYS Performed at [facility], [address], [place], [place] [address]    Report Status PENDING  Incomplete         Radiology Studies: DG Swallowing Func-Speech Pathology  Result Date: 01/19/2023 Table formatting from the original result was not included. Modified Barium Swallow Study Patient Details Name: LARIN BRALLIER MRN: [id number] Date of Birth: 1928/02/12 Today's Date: 01/19/2023 HPI/PMH: HPI: 89 y.o. male with medical history significant for Alzheimer's dementia, CAD, CKD stage IIIb, COPD, rheumatoid arthritis, HTN, HLD, prostate cancer, OSA not using CPAP, recent hospitalization from 01/02/2023-12/07/2022 with COVID-19 infection treated with Paxlovid and oral steroids presented with worsening abdominal pain and nausea along with fever.  On presentation, WBC was 41, creatinine 1.58, AST 98, ALT 207, alkaline phosphatase 124, total bili of 2, lipase 24, lactic acid 3.4.  COVID-19 PCR positive.  Chest x-ray showed low lung volumes without focal consolidation, edema or effusion.  CT of abdomen/pelvis with contrast showed cholelithiasis with acute cholecystitis.  General surgery was consulted.  He was started on IV fluids and antibiotics.  Family denies patient having diagnosis of Parkinson's but do report that he participates in class that helps him with physical normal ability and and voiced drink.  They report his ambulation has diminished and his voice has weakened some since COVID.  Daughter reports patient with occasional cough with intake before admission, but wife denies this occurring. Clinical Impression: Clinical Impression: Patient presents with a moderately impaired oral phase of swallow but with pharyngeal phase appearing functional for his age. Swallow was initiated at level of vallecular sinus, only trace amount of barium observed  sitting above PES after majority of liquid and solid swallows. Patient exhibited good airway protection, with full epiglottic inversion, no penetration or aspiration observed with any of the tested consistencies. No significant pharyngeal retention of barium observed. During oral phase, patient exhibited significantly delayed anterior to posterior transit, reduced bolus cohesion within oral cavity and PO residuals remaining on tongue, base of tongue and floor of mouth. He had the most difficulty with thicker consistencies such as honey thick liquids and solids. SLP recommending to continue with full liquids and will plan for advanced solids trials at bedside. Factors that may increase risk of adverse event in presence of aspiration Rubye Oaks & Clearance Coots 2021): Factors that may  increase risk of adverse event in presence of aspiration Rubye Oaks([employer] Clearance Coots[employer]illa 2021): Poor general health and/or compromised immunity; Reduced cognitive function; Limited mobility; Frail or deconditioned; Dependence for feeding and/or oral hygiene; Weak cough Recommendations/Plan: Swallowing Evaluation Recommendations Swallowing Evaluation Recommendations Recommendations: PO diet PO Diet Recommendation: Full liquid diet; Thin liquids (Level 0) Liquid Administration via: Cup; Straw; Spoon Medication Administration: Crushed with puree Supervision: Full assist for feeding; Full supervision/cueing for swallowing strategies Swallowing strategies  : Slow rate; Small bites/sips; Check for pocketing or oral holding Postural changes: Position pt fully upright for meals Oral care recommendations: Oral care BID (2x/day); Oral care before PO Treatment Plan Treatment Plan Treatment recommendations: Therapy as outlined in treatment plan below Follow-up recommendations: Skilled nursing-short term rehab (<3 hours/day) Functional status assessment: Patient has had a recent decline in their functional status and demonstrates the ability to make significant improvements  in function in a reasonable and predictable amount of time. Treatment frequency: Min 2x/week Treatment duration: 1 week Interventions: Aspiration precaution training; Compensatory techniques; Patient/family education; Trials of upgraded texture/liquids; Diet toleration management by SLP Recommendations Recommendations for follow up therapy are one component of a multi-disciplinary discharge planning process, led by the attending physician.  Recommendations may be updated based on patient status, additional functional criteria and insurance authorization. Assessment: Orofacial Exam: Orofacial Exam Oral Cavity: Oral Hygiene: WFL Oral Cavity - Dentition: Dentures, top; Dentures, bottom Orofacial Anatomy: WFL Oral Motor/Sensory Function: Unable to test Anatomy: Anatomy: Suspected cervical osteophytes Boluses Administered: Boluses Administered Boluses Administered: Thin liquids (Level 0); Mildly thick liquids (Level 2, nectar thick); Moderately thick liquids (Level 3, honey thick); Puree; Solid  Oral Impairment Domain: Oral Impairment Domain Lip Closure: No labial escape Tongue control during bolus hold: Not tested Bolus preparation/mastication: Slow prolonged chewing/mashing with complete recollection Bolus transport/lingual motion: Slow tongue motion Oral residue: Residue collection on oral structures Location of oral residue : Floor of mouth; Tongue Initiation of pharyngeal swallow : Valleculae  Pharyngeal Impairment Domain: Pharyngeal Impairment Domain Soft palate elevation: No bolus between soft palate (SP)/pharyngeal wall (PW) Laryngeal elevation: Complete superior movement of thyroid cartilage with complete approximation of arytenoids to epiglottic petiole Anterior hyoid excursion: Complete anterior movement Epiglottic movement: Complete inversion Laryngeal vestibule closure: Complete, no air/contrast in laryngeal vestibule Pharyngeal stripping wave : Present - diminished Pharyngeal contraction (A/P view only):  N/A Pharyngoesophageal segment opening: Complete distension and complete duration, no obstruction of flow Tongue base retraction: Trace column of contrast or air between tongue base and PPW Pharyngeal residue: Trace residue within or on pharyngeal structures Location of pharyngeal residue: Pyriform sinuses  Esophageal Impairment Domain: Esophageal Impairment Domain Esophageal clearance upright position: Complete clearance, esophageal coating Pill: Esophageal Impairment Domain Esophageal clearance upright position: Complete clearance, esophageal coating Penetration/Aspiration Scale Score: Penetration/Aspiration Scale Score 1.  Material does not enter airway: Thin liquids (Level 0); Mildly thick liquids (Level 2, nectar thick); Moderately thick liquids (Level 3, honey thick); Puree Compensatory Strategies: Compensatory Strategies Compensatory strategies: No   General Information: Caregiver present: No  Diet Prior to this Study: Full liquid diet   Temperature : Normal   Respiratory Status: WFL   Supplemental O2: None (Room air)   History of Recent Intubation: No  Behavior/Cognition: Alert; Cooperative; Requires cueing Self-Feeding Abilities: Dependent for feeding Baseline vocal quality/speech: Hypophonia/low volume Volitional Cough: Unable to elicit Volitional Swallow: Unable to elicit Exam Limitations: No limitations Goal Planning: Prognosis for improved oropharyngeal function: Fair Barriers to Reach Goals: Severity of deficits; Behavior; Cognitive deficits No data recorded  Patient/Family Stated Goal: for pt to get better Consulted and agree with results and recommendations: Pt unable/family or caregiver not available Pain: Pain Assessment Pain Assessment: Faces Breathing: 0 Negative Vocalization: 0 Facial Expression: 0 Body Language: 0 Consolability: 0 PAINAD Score: 0 Pain Location: Drain site; generalized with movement Pain Descriptors / Indicators: Grimacing Pain Intervention(s): Limited activity within patient's  tolerance; Monitored during session End of Session: Start Time:SLP Start Time (ACUTE ONLY): 1308 Stop Time: SLP Stop Time (ACUTE ONLY): 0855 Time Calculation:SLP Time Calculation (min) (ACUTE ONLY): 20 min Charges: SLP Evaluations $ SLP Speech Visit: 1 Visit SLP Evaluations $BSS Swallow: 1 Procedure $MBS Swallow: 1 Procedure $Swallowing Treatment: 1 Procedure SLP visit diagnosis: SLP Visit Diagnosis: Dysphagia, oral phase (R13.11) Past Medical History: Past Medical History: Diagnosis Date  Alzheimer disease 07/31/2018  Angina pectoris   Arthritis   Cardiomyopathy   COPD (chronic obstructive pulmonary disease)   Gastroesophageal reflux disease   History of colon polyps   Hyperlipidemia   Hypertension   Obesity   Osteoporosis   Polymyalgia rheumatica   Prostate cancer   Rheumatoid arthritis   Secondary Parkinson disease 08/13/2019  Sleep apnea  Past Surgical History: Past Surgical History: Procedure Laterality Date  CATARACT EXTRACTION    CORONARY ANGIOPLASTY    HEMORRHOIDECTOMY WITH HEMORRHOID BANDING    IR PERC CHOLECYSTOSTOMY  01/16/2023  PROSTATECTOMY    STRABISMUS SURGERY    TONSILLECTOMY   Angela Nevin, MA, CCC-SLP Speech Therapy        Scheduled Meds:  insulin aspart  0-6 Units Subcutaneous Q4H   levothyroxine  25 mcg Oral Q0600   memantine  10 mg Oral BID   metoprolol succinate  12.5 mg Oral Daily   mouth rinse  15 mL Mouth Rinse 4 times per day   sodium chloride flush  3 mL Intravenous Q12H   sodium chloride flush  5 mL Intracatheter Q8H   Continuous Infusions:  dextrose 5 % and 0.45% NaCl 50 mL/hr at 01/20/23 0416   piperacillin-tazobactam (ZOSYN)  IV 3.375 g (01/20/23 0354)          Glade Lloyd, MD [facility] 01/20/2023, 8:13 AM

## 2023-01-20 NOTE — Progress Notes (Signed)
Daughter, Darl Pikes, called and given an update on patient's BP and patient had a BM.

## 2023-01-20 NOTE — Progress Notes (Signed)
Daily Progress Note   Patient Name: Devin Vance       Date: 01/20/2023 DOB: 30-Mar-1928  Age: 89 y.o. MRN#: [id number] Attending Physician: Glade Lloyd, MD Primary Care Physician: Georgann Housekeeper, MD Admit Date: 01/15/2023  Reason for Consultation/Follow-up: Establishing goals of care  Subjective:  Patient was awake most of the night as per family member at bedside, appears somnolent today, minimal to nil PO intake thus far.        Length of Stay: 5  Current Medications: Scheduled Meds:   insulin aspart  0-6 Units Subcutaneous Q4H   levothyroxine  25 mcg Oral Q0600   memantine  10 mg Oral BID   metoprolol succinate  12.5 mg Oral Daily   mouth rinse  15 mL Mouth Rinse 4 times per day   sodium chloride flush  3 mL Intravenous Q12H   sodium chloride flush  5 mL Intracatheter Q8H    Continuous Infusions:  dextrose 5 % and 0.45% NaCl 50 mL/hr at 01/20/23 0416   piperacillin-tazobactam (ZOSYN)  IV 3.375 g (01/20/23 0956)    PRN Meds: acetaminophen **OR** acetaminophen, ipratropium-albuterol, morphine injection, ondansetron **OR** ondansetron (ZOFRAN) IV, mouth rinse  Physical Exam         Not awake not alert today Regular work of breathing No distress  Has RUQ drain  Vital Signs: BP (!) 160/82   Pulse 69   Temp 98.1 F (36.7 C)   Resp 20   Ht  (1.778 m)   Wt 90.6 kg Comment: SCD pump off bed  SpO2 93%   BMI 28.66 kg/m  SpO2: SpO2: 93 % O2 Device: O2 Device: Room Air O2 Flow Rate: O2 Flow Rate (L/min): 3 L/min  Intake/output summary:  Intake/Output Summary (Last 24 hours) at 01/20/2023 1229 Last data filed at 01/20/2023 1151 Gross per 24 hour  Intake 878 ml  Output 1990 ml  Net -1112 ml    LBM: Last BM Date :  (UTA) Baseline Weight: Weight: 90.7 kg Most  recent weight: Weight: 90.6 kg (SCD pump off bed)       Palliative Assessment/Data:      Patient Active Problem List   Diagnosis Date Noted   Acute calculous cholecystitis 01/16/2023   Hyperglycemia 01/16/2023   Coronary artery disease 01/16/2023   Hypothyroidism 01/16/2023   Prostate  cancer 01/15/2023   Polymyalgia rheumatica 01/15/2023   Severe sepsis with lactic acidosis 01/15/2023   Lab test positive for detection of COVID-19 virus 01/02/2023   AAA (abdominal aortic aneurysm) 01/02/2023   Dilation of stomach 01/02/2023   Chronic diarrhea 01/02/2023   Alzheimer's dementia with agitation 06/02/2022   AMS (altered mental status) 02/16/2021   Gait abnormality 07/15/2019   Cardiomyopathy 03/24/2019   COPD (chronic obstructive pulmonary disease) 03/24/2019   HLD (hyperlipidemia) 03/24/2019   Acute renal failure superimposed on stage 3 chronic kidney disease 10/28/2018   Chronic kidney disease, stage 3b 10/28/2018   Rheumatoid arthritis involving multiple sites 10/28/2018   Dementia with behavioral disturbance 10/27/2018   Alzheimer's dementia with behavioral disturbance 07/31/2018   Essential hypertension 08/15/2017   Anemia, mild 02/12/2017   Olecranon bursitis of right elbow 02/12/2017   Kyphosis of cervical region 10/26/2015   Intermittent alternating esotropia 05/18/2015   GERD (gastroesophageal reflux disease) 01/22/2014   Angina pectoris 01/22/2014    Palliative Care Assessment & Plan   Patient Profile:    Assessment:  Devin Vance is a 89 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 He has a history of Alzheimer's dementia for the past 5 to 6 years.  He lives at [facility] independent living facility.  He has a recent hospitalization for COVID.  He has been admitted to hospital medicine service with the surgical colleagues and vaginal radiology colleagues following for cholecystitis for which he underwent  percutaneous cholecystostomy tube placement on 01-16-2023.  Hospital course complicated by altered mental status and patient not awake alert enough to have a meaningful oral intake of clear liquids which has been prescribed. Palliative consult for ongoing goals of care discussions has been requested. Chart reviewed, patient seen and examined, discussed with family present at bedside.  Patient has past medical history of Alzheimer's dementia obstructive sleep apnea stage III chronic kidney disease hypertension GERD coronary artery disease and history of prostate cancer. He has a had the cholecystostomy tube placement, he has had gradual progressive functional and cognitive decline from a dementia standpoint even prior to this gallbladder episode and prior to recent COVID hospitalization as well.  Generally at baseline, he is wheelchair-bound and only gives one-word answers.    Recommendations/Plan:   Monitor PO intake and participation with PT OT and SLP Family considering SNF rehab with palliative, if he is deemed to be eligible. However, hospital course complicated by waxing and waning mental status and minimal to nil PO intake. Discussed with family at bedside about the alternative option of comfort care/hospice at his facility if no meaningful recovery seen in the hospital.    Code Status:    Code Status Orders  (From admission, onward)           Start     Ordered   01/16/23 0019  Do not attempt resuscitation (DNR)  Continuous       Question Answer Comment  If patient has no pulse and is not breathing Do Not Attempt Resuscitation   If patient has a pulse and/or is breathing: Medical Treatment Goals LIMITED ADDITIONAL INTERVENTIONS: Use medication/IV fluids and cardiac monitoring as indicated; Do not use intubation or mechanical ventilation (DNI), also provide comfort medications.  Transfer to Progressive/Stepdown as indicated, avoid Intensive Care.   Consent: Discussion documented in EHR  or advanced directives reviewed      01/16/23 0020  Code Status History     Date Active Date Inactive Code Status Order ID Comments User Context   01/02/2023 0818 01/05/2023 1848 Full Code [id number]  Bobette Mortiz, David Manuel, MD ED       Prognosis:  Unable to determine  Discharge Planning: Skilled Nursing Facility for rehab with Palliative care service follow-up Versus back to [facility] with Hospice services.   Care plan was discussed with  family.   Thank you for allowing the Palliative Medicine Team to assist in the care of this patient. mod MDM.      Greater than 50%  of this time was spent counseling and coordinating care related to the above assessment and plan.  Rosalin HawkingZeba Deavion Dobbs, MD  Please contact Palliative Medicine Team phone at [phone] for questions and concerns.

## 2023-01-21 DIAGNOSIS — A419 Sepsis, unspecified organism: Secondary | ICD-10-CM | POA: Diagnosis not present

## 2023-01-21 DIAGNOSIS — R6520 Severe sepsis without septic shock: Secondary | ICD-10-CM | POA: Diagnosis not present

## 2023-01-21 DIAGNOSIS — K8000 Calculus of gallbladder with acute cholecystitis without obstruction: Secondary | ICD-10-CM | POA: Diagnosis not present

## 2023-01-21 DIAGNOSIS — E872 Acidosis, unspecified: Secondary | ICD-10-CM | POA: Diagnosis not present

## 2023-01-21 LAB — CBC WITH DIFFERENTIAL/PLATELET
Abs Immature Granulocytes: 0.03 10*3/uL (ref 0.00–0.07)
Basophils Absolute: 0 10*3/uL (ref 0.0–0.1)
Basophils Relative: 0 %
Eosinophils Absolute: 0.3 10*3/uL (ref 0.0–0.5)
Eosinophils Relative: 4 %
HCT: 33 % — ABNORMAL LOW (ref 39.0–52.0)
Hemoglobin: 10.4 g/dL — ABNORMAL LOW (ref 13.0–17.0)
Immature Granulocytes: 0 %
Lymphocytes Relative: 11 %
Lymphs Abs: 0.9 10*3/uL (ref 0.7–4.0)
MCH: 29.6 pg (ref 26.0–34.0)
MCHC: 31.5 g/dL (ref 30.0–36.0)
MCV: 94 fL (ref 80.0–100.0)
Monocytes Absolute: 0.2 10*3/uL (ref 0.1–1.0)
Monocytes Relative: 3 %
Neutro Abs: 6.1 10*3/uL (ref 1.7–7.7)
Neutrophils Relative %: 82 %
Platelets: 131 10*3/uL — ABNORMAL LOW (ref 150–400)
RBC: 3.51 MIL/uL — ABNORMAL LOW (ref 4.22–5.81)
RDW: 13.3 % (ref 11.5–15.5)
WBC: 7.5 10*3/uL (ref 4.0–10.5)
nRBC: 0 % (ref 0.0–0.2)

## 2023-01-21 LAB — COMPREHENSIVE METABOLIC PANEL
ALT: 50 U/L — ABNORMAL HIGH (ref 0–44)
AST: 29 U/L (ref 15–41)
Albumin: 2.2 g/dL — ABNORMAL LOW (ref 3.5–5.0)
Alkaline Phosphatase: 94 U/L (ref 38–126)
Anion gap: 6 (ref 5–15)
BUN: 20 mg/dL (ref 8–23)
CO2: 27 mmol/L (ref 22–32)
Calcium: 7.9 mg/dL — ABNORMAL LOW (ref 8.9–10.3)
Chloride: 106 mmol/L (ref 98–111)
Creatinine, Ser: 1.39 mg/dL — ABNORMAL HIGH (ref 0.61–1.24)
GFR, Estimated: 47 mL/min — ABNORMAL LOW (ref >60)
Glucose, Bld: 112 mg/dL — ABNORMAL HIGH (ref 70–99)
Potassium: 3.2 mmol/L — ABNORMAL LOW (ref 3.5–5.1)
Sodium: 139 mmol/L (ref 135–145)
Total Bilirubin: 1.6 mg/dL — ABNORMAL HIGH (ref 0.3–1.2)
Total Protein: 5.1 g/dL — ABNORMAL LOW (ref 6.5–8.1)

## 2023-01-21 LAB — GLUCOSE, CAPILLARY
Glucose-Capillary: 100 mg/dL — ABNORMAL HIGH (ref 70–99)
Glucose-Capillary: 109 mg/dL — ABNORMAL HIGH (ref 70–99)
Glucose-Capillary: 110 mg/dL — ABNORMAL HIGH (ref 70–99)
Glucose-Capillary: 122 mg/dL — ABNORMAL HIGH (ref 70–99)
Glucose-Capillary: 123 mg/dL — ABNORMAL HIGH (ref 70–99)
Glucose-Capillary: 137 mg/dL — ABNORMAL HIGH (ref 70–99)
Glucose-Capillary: 99 mg/dL (ref 70–99)

## 2023-01-21 LAB — AEROBIC/ANAEROBIC CULTURE W GRAM STAIN (SURGICAL/DEEP WOUND)
Culture: NO GROWTH
Gram Stain: NONE SEEN

## 2023-01-21 LAB — MAGNESIUM: Magnesium: 2 mg/dL (ref 1.7–2.4)

## 2023-01-21 MED ORDER — ORAL CARE MOUTH RINSE: 15.0000 mL | OROMUCOSAL | Status: DC | PRN

## 2023-01-21 NOTE — Progress Notes (Signed)
Daily Progress Note   Patient Name: Devin Vance       Date: 01/21/2023 DOB: Jul 16, 1928  Age: 89 y.o. MRN#: [id number] Attending Physician: Glade Lloyd, MD Primary Care Physician: Georgann Housekeeper, MD Admit Date: 01/15/2023  Reason for Consultation/Follow-up: Establishing goals of care  Subjective:  Patient is resting in bed, he attempted to participate some with OT eaerli today, PO intake is still very minimal, discussed with wife and daughter at bedside, see below.      Length of Stay: 6  Current Medications: Scheduled Meds:   insulin aspart  0-6 Units Subcutaneous Q4H   levothyroxine  25 mcg Oral Q0600   memantine  10 mg Oral BID   metoprolol succinate  12.5 mg Oral Daily   mouth rinse  15 mL Mouth Rinse 4 times per day   senna-docusate  1 tablet Oral BID   sodium chloride flush  3 mL Intravenous Q12H   sodium chloride flush  5 mL Intracatheter Q8H    Continuous Infusions:  dextrose 5 % and 0.45% NaCl 50 mL/hr at 01/20/23 2233   piperacillin-tazobactam (ZOSYN)  IV 3.375 g (01/21/23 0135)    PRN Meds: acetaminophen **OR** acetaminophen, ipratropium-albuterol, morphine injection, ondansetron **OR** ondansetron (ZOFRAN) IV, mouth rinse, polyethylene glycol  Physical Exam         Not awake not alert today Regular work of breathing No distress  Has RUQ drain  Vital Signs: BP (!) 144/74 (BP Location: Right Arm)   Pulse 62   Temp 98.4 F (36.9 C) (Oral)   Resp 20   Ht 5\' 10"  (1.778 m)   Wt 91.5 kg   SpO2 95%   BMI 28.94 kg/m  SpO2: SpO2: 95 % O2 Device: O2 Device: Room Air O2 Flow Rate: O2 Flow Rate (L/min): 3 L/min  Intake/output summary:  Intake/Output Summary (Last 24 hours) at 01/21/2023 1117 Last data filed at 01/21/2023 0550 Gross per 24 hour  Intake 295.43 ml   Output 1830 ml  Net -1534.57 ml    LBM: Last BM Date : 01/20/23 Baseline Weight: Weight: 90.7 kg Most recent weight: Weight: 91.5 kg       Palliative Assessment/Data:      Patient Active Problem List   Diagnosis Date Noted   Acute calculous cholecystitis 01/16/2023   Hyperglycemia 01/16/2023   Coronary  artery disease 01/16/2023   Hypothyroidism 01/16/2023   Prostate cancer 01/15/2023   Polymyalgia rheumatica 01/15/2023   Severe sepsis with lactic acidosis 01/15/2023   Lab test positive for detection of COVID-19 virus 01/02/2023   AAA (abdominal aortic aneurysm) 01/02/2023   Dilation of stomach 01/02/2023   Chronic diarrhea 01/02/2023   Alzheimer's dementia with agitation 06/02/2022   AMS (altered mental status) 02/16/2021   Gait abnormality 07/15/2019   Cardiomyopathy 03/24/2019   COPD (chronic obstructive pulmonary disease) 03/24/2019   HLD (hyperlipidemia) 03/24/2019   Acute renal failure superimposed on stage 3 chronic kidney disease 10/28/2018   Chronic kidney disease, stage 3b 10/28/2018   Rheumatoid arthritis involving multiple sites 10/28/2018   Dementia with behavioral disturbance 10/27/2018   Alzheimer's dementia with behavioral disturbance 07/31/2018   Essential hypertension 08/15/2017   Anemia, mild 02/12/2017   Olecranon bursitis of right elbow 02/12/2017   Kyphosis of cervical region 10/26/2015   Intermittent alternating esotropia 05/18/2015   GERD (gastroesophageal reflux disease) 01/22/2014   Angina pectoris 01/22/2014    Palliative Care Assessment & Plan   Patient Profile:    Assessment:  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 He has a history of Alzheimer's dementia for the past 5 to 6 years.  He lives at [facility] independent living facility.  He has a recent hospitalization for COVID.  He has been admitted to hospital medicine service with the surgical  colleagues and vaginal radiology colleagues following for cholecystitis for which he underwent percutaneous cholecystostomy tube placement on 01-16-2023.  Hospital course complicated by altered mental status and patient not awake alert enough to have a meaningful oral intake of clear liquids which has been prescribed. Palliative consult for ongoing goals of care discussions has been requested. Chart reviewed, patient seen and examined, discussed with family present at bedside.  Patient has past medical history of Alzheimer's dementia obstructive sleep apnea stage III chronic kidney disease hypertension GERD coronary artery disease and history of prostate cancer. He has a had the cholecystostomy tube placement, he has had gradual progressive functional and cognitive decline from a dementia standpoint even prior to this gallbladder episode and prior to recent COVID hospitalization as well.  Generally at baseline, he is wheelchair-bound and only gives one-word answers.    Recommendations/Plan:   Monitor PO intake and participation with PT OT and SLP Discussed with family at bedside regarding the patient likely not benefiting much from SNF rehab with palliative at this point. Alternatively, discussed with them about having PT and palliative care at his facility [facility] and monitoring his trajectory overall, if he has ongoing decline, then would recommend hospice services after PT and palliative attempt.    Code Status:    Code Status Orders  (From admission, onward)           Start     Ordered   01/16/23 0019  Do not attempt resuscitation (DNR)  Continuous       Question Answer Comment  If patient has no pulse and is not breathing Do Not Attempt Resuscitation   If patient has a pulse and/or is breathing: Medical Treatment Goals LIMITED ADDITIONAL INTERVENTIONS: Use medication/IV fluids and cardiac monitoring as indicated; Do not use intubation or mechanical ventilation (DNI), also provide  comfort medications.  Transfer to Progressive/Stepdown as indicated, avoid Intensive Care.   Consent: Discussion documented in EHR or advanced directives reviewed  01/16/23 0020           Code Status History     Date Active Date Inactive Code Status Order ID Comments User Context   01/02/2023 0818 01/05/2023 1848 Full Code [id number]  Bobette Mortiz, David Manuel, MD ED       Prognosis:  Guarded   Discharge Planning:  back to [facility] with PT and palliative services is currently being considered.   Care plan was discussed with  family.   Thank you for allowing the Palliative Medicine Team to assist in the care of this patient. mod MDM.      Greater than 50%  of this time was spent counseling and coordinating care related to the above assessment and plan.  Rosalin HawkingZeba Anjel Perfetti, MD  Please contact Palliative Medicine Team phone at [phone] for questions and concerns.

## 2023-01-21 NOTE — Progress Notes (Signed)
PROGRESS NOTE    Devin Vance  ZOX:[id number] DOB: 07/05/1928 DOA: 01/15/2023 PCP: Georgann HousekeeperHusain, Karrar, MD   Brief Narrative:  89 y.o. male with medical history significant for Alzheimer's dementia, CAD, CKD stage IIIb, COPD, rheumatoid arthritis, HTN, HLD, prostate cancer, OSA not using CPAP, recent hospitalization from 01/02/2023-12/07/2022 with COVID-19 infection treated with Paxlovid and oral steroids presented with worsening abdominal pain and nausea along with fever.  On presentation, WBC was 41, creatinine 1.58, AST 98, ALT 207, alkaline phosphatase 124, total bili of 2, lipase 24, lactic acid 3.4.  COVID-19 PCR positive.  Chest x-ray showed low lung volumes without focal consolidation, edema or effusion.  CT of abdomen/pelvis with contrast showed cholelithiasis with acute cholecystitis.  General surgery was consulted.  He was started on IV fluids and antibiotics.  He underwent percutaneous cholecystostomy tube placement by IR on 01/16/2023.  Palliative care was also consulted.  Assessment & Plan:   Severe sepsis: Present on admission Acute calculus cholecystitis Elevated LFTs Lactic acidosis -Presented with significant leukocytosis, tachycardia, tachypnea, lactic acidosis and CT findings consistent with acute calculus cholecystitis -Continue Zosyn.  General surgery signed off on 01/18/2023.  Currently on full liquid diet.  Status post IR guided percutaneous cholecystostomy tube placement on 01/16/2023.  Continue pain management and IV fluids.  Oral intake remains poor.  Cultures negative so far -Monitor LFTs  Leukocytosis -Resolved.   CKD stage IIIb -monitor creatinine.  Creatinine at baseline  Hypokalemia -Replace.  Repeat a.m. labs  COVID-19 positive -Initially positive 01/02/2023 and treated with Paxlovid. This is a persistent positive and clinically nonsignificant.   CAD -Stable.  Aspirin on hold  Thrombocytopenia -No signs of bleeding.  Monitor.  Hypertension -Monitor blood  pressure.  Blood pressures intermittently elevated.  Metoprolol resumed on 01/19/2023  Alzheimer's dementia Acute metabolic encephalopathy - Provigil, Seroquel on hold for now -Mental status worsened because of ongoing infection.  Improving slightly.  Still extremely slow to respond.  Namenda resumed on 01/19/2023 after discussion with family.  Dysphagia -SLP following.  Currently on full liquid diet.    Rheumatoid arthritis -Plaquenil on hold for now  Hypothyroidism--continue Synthroid, resume from today  OSA -Does not use CPAP  Goals of care -Overall prognosis is guarded to poor.  Palliative care following  Physical deconditioning -PT recommends SNF placement.  TOC following.  DVT prophylaxis: SCDs Code Status: DNR Family Communication: Daughter and wife at bedside  Disposition Plan: Status is: Inpatient Remains inpatient appropriate because: Of severity of illness  Consultants: General surgery.  IR.  Palliative care  Procedures: As above  Antimicrobials: Zosyn from 01/15/23 onwards   Subjective: Patient seen and examined at bedside.  No seizures, agitation, vomiting or fever reported.  Oral intake remains extremely poor.   Objective: Vitals:   01/20/23 2102 01/20/23 2247 01/21/23 0500 01/21/23 0556  BP: (!) 161/78 (!) 112/96  (!) 144/74  Pulse:    62  Resp:    20  Temp:    98.4 F (36.9 C)  TempSrc:    Oral  SpO2:    95%  Weight:   91.5 kg   Height:        Intake/Output Summary (Last 24 hours) at 01/21/2023 0800 Last data filed at 01/21/2023 0550 Gross per 24 hour  Intake 500.43 ml  Output 1930 ml  Net -1429.57 ml    Filed Weights   01/19/23 0606 01/20/23 0500 01/21/23 0500  Weight: 94.6 kg 90.6 kg 91.5 kg    Examination:  General: Still on room  air.  No acute distress currently.  Looks chronically ill and deconditioned.  Elderly male lying in bed. ENT/neck: No obvious neck masses or JVD elevation noted  respiratory: Decreased breath sounds at bases  bilaterally with some crackles CVS: S1-S2 are heard; rate controlled currently  abdominal: Soft, tender in the upper quadrant; still has some distention; no organomegaly, normal bowel sounds heard.  Right upper quadrant percutaneous tube present Extremities: Mild lower extremity edema present; no cyanosis  CNS: Awake, very slow to respond; confused.  Poor historian.  No focal neurologic deficit.  Moving extremities Lymph: No obvious lymphadenopathy palpable  skin: No obvious ecchymosis/lesions psych: Showing no signs of agitation.  Affect is flat.   Musculoskeletal: No obvious joint swelling/deformity     Data Reviewed: I have personally reviewed following labs and imaging studies  CBC: Recent Labs  Lab 01/17/23 0522 01/18/23 0455 01/19/23 0532 01/20/23 0458 01/21/23 0425  WBC 30.2* 16.8* 11.3* 9.1 7.5  NEUTROABS 27.8* 15.0* 9.8* 7.5 6.1  HGB 12.6* 11.2* 9.8* 10.0* 10.4*  HCT 39.3 35.9* 31.0* 31.5* 33.0*  MCV 94.0 96.8 96.3 94.6 94.0  PLT 170 129* 115* 121* 131*    Basic Metabolic Panel: Recent Labs  Lab 01/17/23 0522 01/18/23 0455 01/19/23 0532 01/20/23 0458 01/21/23 0425  NA 146* 144 138 138 139  K 4.2 3.7 3.4* 3.3* 3.2*  CL 113* 111 107 109 106  CO2 GLUCOSE 140* 118* 102* 111* 112*  BUN 40* 37* 32* 25* 20  CREATININE 1.62* 1.68* 1.43* 1.35* 1.39*  CALCIUM 8.3* 8.1* 7.7* 7.8* 7.9*  MG 2.5* 2.3 2.1 1.9 2.0    GFR: Estimated Creatinine Clearance: 37.8 mL/min (A) (by C-G formula based on SCr of 1.39 mg/dL (H)). Liver Function Tests: Recent Labs  Lab 01/17/23 0522 01/18/23 0455 01/19/23 0532 01/20/23 0458 01/21/23 0425  AST 13* 29  ALT 107* 78* 55* 43 50*  ALKPHOS 95 78 70 81 94  BILITOT 1.2 0.9 0.7 1.0 1.6*  PROT 5.2* 5.2* 4.9* 5.1* 5.1*  ALBUMIN 2.4* 2.2* 2.1* 2.2* 2.2*    Recent Labs  Lab 01/15/23 2108  LIPASE 24    No results for input(s): "AMMONIA" in the last 168 hours. Coagulation Profile: Recent Labs  Lab  01/15/23 2108  INR 1.1    Cardiac Enzymes: No results for input(s): "CKTOTAL", "CKMB", "CKMBINDEX", "TROPONINI" in the last 168 hours. BNP (last 3 results) No results for input(s): "PROBNP" in the last 8760 hours. HbA1C: No results for input(s): "HGBA1C" in the last 72 hours.  CBG: Recent Labs  Lab 01/20/23 1737 01/20/23 2048 01/21/23 0041 01/21/23 0436 01/21/23 0737  GLUCAP 124* 109* 110* 99 100*    Lipid Profile: No results for input(s): "CHOL", "HDL", "LDLCALC", "TRIG", "CHOLHDL", "LDLDIRECT" in the last 72 hours. Thyroid Function Tests: No results for input(s): "TSH", "T4TOTAL", "FREET4", "T3FREE", "THYROIDAB" in the last 72 hours. Anemia Panel: No results for input(s): "VITAMINB12", "FOLATE", "FERRITIN", "TIBC", "IRON", "RETICCTPCT" in the last 72 hours. Sepsis Labs: Recent Labs  Lab 01/15/23 2108 01/15/23 2353 01/16/23 0514  PROCALCITON  --   --  1.49  LATICACIDVEN 3.4* 4.8* 2.0*     Recent Results (from the past 240 hour(s))  Blood Culture (routine x 2)     Status: None   Collection Time: 01/15/23  9:55 PM   Specimen: BLOOD  Result Value Ref Range Status   Specimen Description   Final    BLOOD BLOOD RIGHT FOREARM Performed at  [facility], [address] [place], [place] [address]    Special Requests   Final    BOTTLES DRAWN AEROBIC AND ANAEROBIC Blood Culture adequate volume Performed at [facility], [address] [place], [place] [address]    Culture   Final    NO GROWTH 5 DAYS Performed at [facility], [address] [place], [place] [address]    Report Status 01/20/2023 FINAL  Final  Blood Culture (routine x 2)     Status: None   Collection Time: 01/15/23 10:00 PM   Specimen: BLOOD  Result Value Ref Range Status   Specimen Description   Final    BLOOD RIGHT ANTECUBITAL Performed at [facility], [address], [place], [place] [address]    Special Requests   Final     BOTTLES DRAWN AEROBIC AND ANAEROBIC Blood Culture adequate volume Performed at [facility], [address], [place], [place] [address]    Culture   Final    NO GROWTH 5 DAYS Performed at [facility], [address], [place], [place] [address]    Report Status 01/20/2023 FINAL  Final  Resp panel by RT-PCR (RSV, Flu A&B, Covid) Anterior Nasal Swab     Status: Abnormal   Collection Time: 01/15/23 10:17 PM   Specimen: Anterior Nasal Swab  Result Value Ref Range Status   SARS Coronavirus 2 by RT PCR POSITIVE (A) NEGATIVE Final    Comment: (NOTE) SARS-CoV-2 target nucleic acids are DETECTED.  The SARS-CoV-2 RNA is generally detectable in upper respiratory specimens during the acute phase of infection. Positive results are indicative of the presence of the identified virus, but do not rule out bacterial infection or co-infection with other pathogens not detected by the test. Clinical correlation with patient history and other diagnostic information is necessary to determine patient infection status. The expected result is Negative.  Fact Sheet for Patients: BloggerCourse.com  Fact Sheet for Healthcare Providers: SeriousBroker.it  This test is not yet approved or cleared by the [place] FDA and  has been authorized for detection and/or diagnosis of SARS-CoV-2 by FDA under an Emergency Use Authorization (EUA).  This EUA will remain in effect (meaning this test can be used) for the duration of  the COVID-19 declaration under Section 564(b)(1) of the A ct, 21 U.S.C. section 360bbb-3(b)(1), unless the authorization is terminated or revoked sooner.     Influenza A by PCR NEGATIVE NEGATIVE Final   Influenza B by PCR NEGATIVE NEGATIVE Final    Comment: (NOTE) The Xpert Xpress SARS-CoV-2/FLU/RSV plus assay is intended as an aid in the diagnosis of influenza from Nasopharyngeal swab specimens and should not be  used as a sole basis for treatment. Nasal washings and aspirates are unacceptable for Xpert Xpress SARS-CoV-2/FLU/RSV testing.  Fact Sheet for Patients: BloggerCourse.com  Fact Sheet for Healthcare Providers: SeriousBroker.it  This test is not yet approved or cleared by the [place] FDA and has been authorized for detection and/or diagnosis of SARS-CoV-2 by FDA under an Emergency Use Authorization (EUA). This EUA will remain in effect (meaning this test can be used) for the duration of the COVID-19 declaration under Section 564(b)(1) of the Act, 21 U.S.C. section 360bbb-3(b)(1), unless the authorization is terminated or revoked.     Resp Syncytial Virus by PCR NEGATIVE NEGATIVE Final    Comment: (NOTE) Fact Sheet for Patients: BloggerCourse.com  Fact Sheet for Healthcare Providers: SeriousBroker.it  This test is not  yet approved or cleared by the [place] and has been authorized for detection and/or diagnosis of SARS-CoV-2 by FDA under an Emergency Use Authorization (EUA). This EUA will remain in effect (meaning this test can be used) for the duration of the COVID-19 declaration under Section 564(b)(1) of the Act, 21 U.S.C. section 360bbb-3(b)(1), unless the authorization is terminated or revoked.  Performed at [facility], [address] [place], [place] [address]   Aerobic/Anaerobic Culture w Gram Stain (surgical/deep wound)     Status: None (Preliminary result)   Collection Time: 01/16/23  3:12 PM   Specimen: BILE  Result Value Ref Range Status   Specimen Description   Final    BILE Performed at [facility], [address], [place], [place] [address]    Special Requests   Final    NONE Performed at [facility], [address], [place], [place] [address]    Gram Stain NO WBC SEEN NO ORGANISMS  SEEN   Final   Culture   Final    NO GROWTH 4 DAYS NO ANAEROBES ISOLATED; CULTURE IN PROGRESS FOR 5 DAYS Performed at [facility], [address] [place], [place] [address]    Report Status PENDING  Incomplete         Radiology Studies: DG Swallowing Func-Speech Pathology  Result Date: 01/19/2023 Table formatting from the original result was not included. Modified Barium Swallow Study Patient Details Name: CASSEY BATESON MRN: [id number] Date of Birth: 04-17-1928 Today's Date: 01/19/2023 HPI/PMH: HPI: 89 y.o. male with medical history significant for Alzheimer's dementia, CAD, CKD stage IIIb, COPD, rheumatoid arthritis, HTN, HLD, prostate cancer, OSA not using CPAP, recent hospitalization from 01/02/2023-12/07/2022 with COVID-19 infection treated with Paxlovid and oral steroids presented with worsening abdominal pain and nausea along with fever.  On presentation, WBC was 41, creatinine 1.58, AST 98, ALT 207, alkaline phosphatase 124, total bili of 2, lipase 24, lactic acid 3.4.  COVID-19 PCR positive.  Chest x-ray showed low lung volumes without focal consolidation, edema or effusion.  CT of abdomen/pelvis with contrast showed cholelithiasis with acute cholecystitis.  General surgery was consulted.  He was started on IV fluids and antibiotics.  Family denies patient having diagnosis of Parkinson's but do report that he participates in class that helps him with physical normal ability and and voiced drink.  They report his ambulation has diminished and his voice has weakened some since COVID.  Daughter reports patient with occasional cough with intake before admission, but wife denies this occurring. Clinical Impression: Clinical Impression: Patient presents with a moderately impaired oral phase of swallow but with pharyngeal phase appearing functional for his age. Swallow was initiated at level of vallecular sinus, only trace amount of barium observed sitting above PES after majority of liquid and  solid swallows. Patient exhibited good airway protection, with full epiglottic inversion, no penetration or aspiration observed with any of the tested consistencies. No significant pharyngeal retention of barium observed. During oral phase, patient exhibited significantly delayed anterior to posterior transit, reduced bolus cohesion within oral cavity and PO residuals remaining on tongue, base of tongue and floor of mouth. He had the most difficulty with thicker consistencies such as honey thick liquids and solids. SLP recommending to continue with full liquids and will plan for advanced solids trials at bedside. Factors that may increase risk of adverse event in presence of aspiration Rubye Oaks & Clearance Coots 2021): Factors that may increase risk of adverse event in  presence of aspiration Rubye Oaks & Clearance Coots 2021): Poor general health and/or compromised immunity; Reduced cognitive function; Limited mobility; Frail or deconditioned; Dependence for feeding and/or oral hygiene; Weak cough Recommendations/Plan: Swallowing Evaluation Recommendations Swallowing Evaluation Recommendations Recommendations: PO diet PO Diet Recommendation: Full liquid diet; Thin liquids (Level 0) Liquid Administration via: Cup; Straw; Spoon Medication Administration: Crushed with puree Supervision: Full assist for feeding; Full supervision/cueing for swallowing strategies Swallowing strategies  : Slow rate; Small bites/sips; Check for pocketing or oral holding Postural changes: Position pt fully upright for meals Oral care recommendations: Oral care BID (2x/day); Oral care before PO Treatment Plan Treatment Plan Treatment recommendations: Therapy as outlined in treatment plan below Follow-up recommendations: Skilled nursing-short term rehab (<3 hours/day) Functional status assessment: Patient has had a recent decline in their functional status and demonstrates the ability to make significant improvements in function in a reasonable and predictable  amount of time. Treatment frequency: Min 2x/week Treatment duration: 1 week Interventions: Aspiration precaution training; Compensatory techniques; Patient/family education; Trials of upgraded texture/liquids; Diet toleration management by SLP Recommendations Recommendations for follow up therapy are one component of a multi-disciplinary discharge planning process, led by the attending physician.  Recommendations may be updated based on patient status, additional functional criteria and insurance authorization. Assessment: Orofacial Exam: Orofacial Exam Oral Cavity: Oral Hygiene: WFL Oral Cavity - Dentition: Dentures, top; Dentures, bottom Orofacial Anatomy: WFL Oral Motor/Sensory Function: Unable to test Anatomy: Anatomy: Suspected cervical osteophytes Boluses Administered: Boluses Administered Boluses Administered: Thin liquids (Level 0); Mildly thick liquids (Level 2, nectar thick); Moderately thick liquids (Level 3, honey thick); Puree; Solid  Oral Impairment Domain: Oral Impairment Domain Lip Closure: No labial escape Tongue control during bolus hold: Not tested Bolus preparation/mastication: Slow prolonged chewing/mashing with complete recollection Bolus transport/lingual motion: Slow tongue motion Oral residue: Residue collection on oral structures Location of oral residue : Floor of mouth; Tongue Initiation of pharyngeal swallow : Valleculae  Pharyngeal Impairment Domain: Pharyngeal Impairment Domain Soft palate elevation: No bolus between soft palate (SP)/pharyngeal wall (PW) Laryngeal elevation: Complete superior movement of thyroid cartilage with complete approximation of arytenoids to epiglottic petiole Anterior hyoid excursion: Complete anterior movement Epiglottic movement: Complete inversion Laryngeal vestibule closure: Complete, no air/contrast in laryngeal vestibule Pharyngeal stripping wave : Present - diminished Pharyngeal contraction (A/P view only): N/A Pharyngoesophageal segment opening:  Complete distension and complete duration, no obstruction of flow Tongue base retraction: Trace column of contrast or air between tongue base and PPW Pharyngeal residue: Trace residue within or on pharyngeal structures Location of pharyngeal residue: Pyriform sinuses  Esophageal Impairment Domain: Esophageal Impairment Domain Esophageal clearance upright position: Complete clearance, esophageal coating Pill: Esophageal Impairment Domain Esophageal clearance upright position: Complete clearance, esophageal coating Penetration/Aspiration Scale Score: Penetration/Aspiration Scale Score 1.  Material does not enter airway: Thin liquids (Level 0); Mildly thick liquids (Level 2, nectar thick); Moderately thick liquids (Level 3, honey thick); Puree Compensatory Strategies: Compensatory Strategies Compensatory strategies: No   General Information: Caregiver present: No  Diet Prior to this Study: Full liquid diet   Temperature : Normal   Respiratory Status: WFL   Supplemental O2: None (Room air)   History of Recent Intubation: No  Behavior/Cognition: Alert; Cooperative; Requires cueing Self-Feeding Abilities: Dependent for feeding Baseline vocal quality/speech: Hypophonia/low volume Volitional Cough: Unable to elicit Volitional Swallow: Unable to elicit Exam Limitations: No limitations Goal Planning: Prognosis for improved oropharyngeal function: Fair Barriers to Reach Goals: Severity of deficits; Behavior; Cognitive deficits No data recorded Patient/Family Stated Goal: for pt to  get better Consulted and agree with results and recommendations: Pt unable/family or caregiver not available Pain: Pain Assessment Pain Assessment: Faces Breathing: 0 Negative Vocalization: 0 Facial Expression: 0 Body Language: 0 Consolability: 0 PAINAD Score: 0 Pain Location: Drain site; generalized with movement Pain Descriptors / Indicators: Grimacing Pain Intervention(s): Limited activity within patient's tolerance; Monitored during session End  of Session: Start Time:SLP Start Time (ACUTE ONLY): 1610 Stop Time: SLP Stop Time (ACUTE ONLY): 0855 Time Calculation:SLP Time Calculation (min) (ACUTE ONLY): 20 min Charges: SLP Evaluations $ SLP Speech Visit: 1 Visit SLP Evaluations $BSS Swallow: 1 Procedure $MBS Swallow: 1 Procedure $Swallowing Treatment: 1 Procedure SLP visit diagnosis: SLP Visit Diagnosis: Dysphagia, oral phase (R13.11) Past Medical History: Past Medical History: Diagnosis Date  Alzheimer disease 07/31/2018  Angina pectoris   Arthritis   Cardiomyopathy   COPD (chronic obstructive pulmonary disease)   Gastroesophageal reflux disease   History of colon polyps   Hyperlipidemia   Hypertension   Obesity   Osteoporosis   Polymyalgia rheumatica   Prostate cancer   Rheumatoid arthritis   Secondary Parkinson disease 08/13/2019  Sleep apnea  Past Surgical History: Past Surgical History: Procedure Laterality Date  CATARACT EXTRACTION    CORONARY ANGIOPLASTY    HEMORRHOIDECTOMY WITH HEMORRHOID BANDING    IR PERC CHOLECYSTOSTOMY  01/16/2023  PROSTATECTOMY    STRABISMUS SURGERY    TONSILLECTOMY   Angela Nevin, MA, CCC-SLP Speech Therapy        Scheduled Meds:  insulin aspart  0-6 Units Subcutaneous Q4H   levothyroxine  25 mcg Oral Q0600   memantine  10 mg Oral BID   metoprolol succinate  12.5 mg Oral Daily   mouth rinse  15 mL Mouth Rinse 4 times per day   senna-docusate  1 tablet Oral BID   sodium chloride flush  3 mL Intravenous Q12H   sodium chloride flush  5 mL Intracatheter Q8H   Continuous Infusions:  dextrose 5 % and 0.45% NaCl 50 mL/hr at 01/20/23 2233   piperacillin-tazobactam (ZOSYN)  IV 3.375 g (01/21/23 0135)          Glade Lloyd, MD [facility] 01/21/2023, 8:00 AM

## 2023-01-21 NOTE — Plan of Care (Signed)
  Problem: Skin Integrity: Goal: Risk for impaired skin integrity will decrease Outcome: Progressing   Problem: Safety: Goal: Ability to remain free from injury will improve Outcome: Progressing   Problem: Pain Managment: Goal: General experience of comfort will improve Outcome: Progressing   Problem: Coping: Goal: Level of anxiety will decrease Outcome: Progressing   Problem: Nutritional: Goal: Maintenance of adequate nutrition will improve Outcome: Progressing

## 2023-01-21 NOTE — Evaluation (Signed)
Occupational Therapy Evaluation Patient Details Name: Devin Vance MRN: [id number] DOB: 02/15/1928 Today's Date: 01/21/2023   History of Present Illness Pt is 89 yo male admitted on 01/15/23 with severe sepsis due to acute calculus cholecystitis.  Pt s/p IR guided percutaneous cholecystostomy tube on 01/16/23.  Pt with hx including recent hospitalization for COVID, Alzheimer's dementia, CAD, CKD stage IIIb, COPD, rheumatoid arthritis, HTN, HLD, prostate cancer, OSA not using CPAP   Clinical Impression   Patient is currently requiring assistance with ADLs including Total assist with Lower body ADLs, up to maximum to total assist with Upper body ADLs including feeding,  as well as  Max assist with bed mobility and inability to stand or perform transfers to toilet.  Current level of function and cognition is below patient's typical baseline per family.    During this evaluation, patient was limited by generalized weakness, impaired activity tolerance, RT hand pain, lethargy and baseline advanced dementia, all of which has the potential to impact patient's safety and independence during functional mobility, as well as performance for ADLs.  [facility], with his wife who is able to provide 24/7 supervision but light assistance only and has a PCA who comes 3x/week.   Patient demonstrates fair to good rehab potential, and should benefit from continued skilled occupational therapy services while in acute care to maximize safety, independence and quality of life at home.  Continued occupational therapy services are recommended.  ?      Recommendations for follow up therapy are one component of a multi-disciplinary discharge planning process, led by the attending physician.  Recommendations may be updated based on patient status, additional functional criteria and insurance authorization.   Assistance Recommended at Discharge Frequent or constant Supervision/Assistance  Patient can  return home with the following Assistance with feeding;Help with stairs or ramp for entrance;Assist for transportation;Assistance with cooking/housework;Two people to help with walking and/or transfers;A lot of help with walking and/or transfers;Two people to help with bathing/dressing/bathroom;Direct supervision/assist for financial management;Direct supervision/assist for medications management;A lot of help with bathing/dressing/bathroom    Functional Status Assessment  Patient has had a recent decline in their functional status and demonstrates the ability to make significant improvements in function in a reasonable and predictable amount of time.  Equipment Recommendations  Other (comment) (TBD)    Recommendations for Other Services PT consult;Speech consult     Precautions / Restrictions Precautions Precautions: Fall;Other (comment) Precaution Comments: Drain mid-high R back - watch gait belt placement Restrictions Weight Bearing Restrictions: No Other Position/Activity Restrictions: RT hand painful with tough from RA      Mobility Bed Mobility Overal bed mobility: Needs Assistance Bed Mobility: Rolling, Sidelying to Sit, Sit to Supine Rolling: Mod assist Sidelying to sit: Max assist   Sit to supine: Max assist, +2 for physical assistance   General bed mobility comments: Requiring multimodal cues, increased time, assist for legs and trunk    Transfers                          Balance Overall balance assessment: Needs assistance Sitting-balance support: Feet supported, Bilateral upper extremity supported Sitting balance-Leahy Scale: Poor Sitting balance - Comments: Needs UE support and supervision to min A -leaning forward Postural control: Posterior lean, Left lateral lean  ADL either performed or assessed with clinical judgement   ADL Overall ADL's : Needs assistance/impaired Eating/Feeding: Total  assistance;Maximal assistance;Sitting Eating/Feeding Details (indicate cue type and reason): Time spent with pt sitting EOB. Ensure placed in pt's hand. Pt assisted to bring ensure straw to mouth. Pt successfully took 2 long sips with max encouragement. Grooming: Wash/dry hands;Sitting;Moderate assistance Grooming Details (indicate cue type and reason): Pt refused to wash face. Washed hands with Mod As being careful with RT hand. Upper Body Bathing: Maximal assistance;Total assistance;Sitting Upper Body Bathing Details (indicate cue type and reason): Spilled Ensure on pt's arm. Pt able to use RT hand with towel to wipe LT arm with max encouragement. Lower Body Bathing: Bed level;+2 for physical assistance;Total assistance   Upper Body Dressing : Maximal assistance;Bed level   Lower Body Dressing: Total assistance;Bed level     Toilet Transfer Details (indicate cue type and reason): Unable to today. Pt too lethargic and would require 2nd person for safety. Toileting- Clothing Manipulation and Hygiene: Total assistance;Bed level Toileting - Clothing Manipulation Details (indicate cue type and reason): On external catheter     Functional mobility during ADLs: Maximal assistance;+2 for physical assistance       Vision   Additional Comments: Unable to assess     Perception     Praxis      Pertinent Vitals/Pain Pain Assessment Pain Assessment: Faces Faces Pain Scale: Hurts even more Pain Location: Touch to RT hand.  Per family, pt with RA to RT hand. Avoided touching hand. Pain Descriptors / Indicators: Grimacing, Moaning Pain Intervention(s): Limited activity within patient's tolerance, Monitored during session, Repositioned     Hand Dominance     Extremity/Trunk Assessment Upper Extremity Assessment Upper Extremity Assessment: RUE deficits/detail;Generalized weakness (Not following MMT commands; ROM: limited shoulder elevation) RUE Deficits / Details: Hand pain from RA RUE:  Unable to fully assess due to pain RUE Coordination: decreased fine motor   Lower Extremity Assessment Lower Extremity Assessment: Defer to PT evaluation RLE Deficits / Details: ROM: hip and ankle WFL, knee lacking 15 degrees with tight hamstrings; MMT: <2/5 hip and knee LLE Deficits / Details: ROM: hip and ankle WFL, knee lacking 15 degrees with tight hamstrings; MMT: <2/5 hip and knee       Communication Communication Communication: No difficulties   Cognition Arousal/Alertness: Lethargic Behavior During Therapy: Flat affect Overall Cognitive Status: History of cognitive impairments - at baseline Area of Impairment: Orientation, Following commands                 Orientation Level: Disoriented to, Place, Time, Situation (Answering "Yeah" for several questions like, "Where were you born?")     Following Commands: Follows one step commands inconsistently       General Comments: Lethargic with eyes closed to start. Family with straw in pt's mouth trying to make him drink.  Eyes open and more alert once EOB. Still limiting language but able to state "no" and  "I don't know" to some questions.     General Comments       Exercises     Shoulder Instructions      Home Living Family/patient expects to be discharged to:: Skilled nursing facility Living Arrangements: Spouse/significant other Available Help at Discharge: Family;Personal care attendant;Available 24 hours/day (Wife available 24 hr; Has hired aide 3 days week 6 hours)   Home Access: Level entry;Elevator     Home Layout: One level     Bathroom Shower/Tub: Psychologist, counselling  Home Equipment: Rollator (4 wheels)          Prior Functioning/Environment Prior Level of Function : Needs assist  Cognitive Assist : Mobility (cognitive);ADLs (cognitive) Mobility (Cognitive): Step by step cues ADLs (Cognitive): Step by step cues Physical Assist : Mobility (physical);ADLs (physical) Mobility (physical):  Bed mobility;Transfers;Gait ADLs (physical): IADLs;Toileting;Dressing;Bathing Mobility Comments: Pt could ambulate with rollator and supervision; Since recent hospitalization had returned to ambulation but wife reports not as far or well as baseline ADLs Comments: Assist from wife or PCA for all ADLs        OT Problem List:        OT Treatment/Interventions: Self-care/ADL training;Therapeutic activities;Therapeutic exercise;Cognitive remediation/compensation;Neuromuscular education;Patient/family education;DME and/or AE instruction;Balance training    OT Goals(Current goals can be found in the care plan section) Acute Rehab OT Goals Patient Stated Goal: Family: For pt to improve and go to rehab with goal of taking pt home. OT Goal Formulation: With family Time For Goal Achievement: 02/04/23 Potential to Achieve Goals: Fair ADL Goals Pt Will Perform Eating: with min assist;sitting Pt Will Perform Grooming: with min assist;sitting Pt Will Transfer to Toilet: with min assist;stand pivot transfer Pt/caregiver will Perform Home Exercise Program: Increased ROM;Increased strength;Both right and left upper extremity;With minimal assist Additional ADL Goal #1: Pt will engage in at least 15 min functional seated at EOB activities without loss of sitting balance, in order to demonstrate improved activity tolerance and balance needed to perform ADLs safely at home.  OT Frequency: Min 2X/week    Co-evaluation              AM-PAC OT "6 Clicks" Daily Activity     Outcome Measure Help from another person eating meals?: A Lot Help from another person taking care of personal grooming?: A Lot Help from another person toileting, which includes using toliet, bedpan, or urinal?: Total Help from another person bathing (including washing, rinsing, drying)?: Total Help from another person to put on and taking off regular upper body clothing?: A Lot Help from another person to put on and taking off  regular lower body clothing?: Total 6 Click Score: 9   End of Session Nurse Communication: Other (comment) (Family with questions re: bringning pt's PCA to hospital.)  Activity Tolerance: Patient limited by lethargy Patient left: in bed;with call bell/phone within reach;with bed alarm set;with family/visitor present  OT Visit Diagnosis: Muscle weakness (generalized) (M62.81);Pain;Cognitive communication deficit (R41.841);Other symptoms and signs involving cognitive function;Other abnormalities of gait and mobility (R26.89) Pain - Right/Left: Right Pain - part of body: Hand                Time: 2563-8937 OT Time Calculation (min): 38 min Charges:  OT General Charges $OT Visit: 1 Visit OT Evaluation $OT Eval Low Complexity: 1 Low OT Treatments $Self Care/Home Management : 8-22 mins $Therapeutic Activity: 8-22 mins  Victorino Dike, OT Acute Rehab Services Office: [phone] 01/21/2023  Theodoro Clock 01/21/2023, 10:31 AM

## 2023-01-22 DIAGNOSIS — K8000 Calculus of gallbladder with acute cholecystitis without obstruction: Secondary | ICD-10-CM | POA: Diagnosis not present

## 2023-01-22 DIAGNOSIS — A419 Sepsis, unspecified organism: Secondary | ICD-10-CM | POA: Diagnosis not present

## 2023-01-22 DIAGNOSIS — U071 COVID-19: Secondary | ICD-10-CM | POA: Diagnosis not present

## 2023-01-22 DIAGNOSIS — G301 Alzheimer's disease with late onset: Secondary | ICD-10-CM | POA: Diagnosis not present

## 2023-01-22 LAB — CBC WITH DIFFERENTIAL/PLATELET
Abs Immature Granulocytes: 0.04 10*3/uL (ref 0.00–0.07)
Basophils Absolute: 0 10*3/uL (ref 0.0–0.1)
Basophils Relative: 0 %
Eosinophils Absolute: 0.4 10*3/uL (ref 0.0–0.5)
Eosinophils Relative: 4 %
HCT: 35.5 % — ABNORMAL LOW (ref 39.0–52.0)
Hemoglobin: 11.4 g/dL — ABNORMAL LOW (ref 13.0–17.0)
Immature Granulocytes: 1 %
Lymphocytes Relative: 11 %
Lymphs Abs: 0.9 10*3/uL (ref 0.7–4.0)
MCH: 29.7 pg (ref 26.0–34.0)
MCHC: 32.1 g/dL (ref 30.0–36.0)
MCV: 92.4 fL (ref 80.0–100.0)
Monocytes Absolute: 0.3 10*3/uL (ref 0.1–1.0)
Monocytes Relative: 3 %
Neutro Abs: 7.2 10*3/uL (ref 1.7–7.7)
Neutrophils Relative %: 81 %
Platelets: 147 10*3/uL — ABNORMAL LOW (ref 150–400)
RBC: 3.84 MIL/uL — ABNORMAL LOW (ref 4.22–5.81)
RDW: 13.2 % (ref 11.5–15.5)
WBC: 8.8 10*3/uL (ref 4.0–10.5)
nRBC: 0 % (ref 0.0–0.2)

## 2023-01-22 LAB — GLUCOSE, CAPILLARY
Glucose-Capillary: 102 mg/dL — ABNORMAL HIGH (ref 70–99)
Glucose-Capillary: 107 mg/dL — ABNORMAL HIGH (ref 70–99)
Glucose-Capillary: 141 mg/dL — ABNORMAL HIGH (ref 70–99)
Glucose-Capillary: 134 mg/dL — ABNORMAL HIGH (ref 70–99)
Glucose-Capillary: 141 mg/dL — ABNORMAL HIGH (ref 70–99)

## 2023-01-22 LAB — COMPREHENSIVE METABOLIC PANEL
ALT: 97 U/L — ABNORMAL HIGH (ref 0–44)
AST: 61 U/L — ABNORMAL HIGH (ref 15–41)
Albumin: 2.3 g/dL — ABNORMAL LOW (ref 3.5–5.0)
Alkaline Phosphatase: 99 U/L (ref 38–126)
Anion gap: 6 (ref 5–15)
BUN: 17 mg/dL (ref 8–23)
CO2: 27 mmol/L (ref 22–32)
Calcium: 8 mg/dL — ABNORMAL LOW (ref 8.9–10.3)
Chloride: 106 mmol/L (ref 98–111)
Creatinine, Ser: 1.32 mg/dL — ABNORMAL HIGH (ref 0.61–1.24)
GFR, Estimated: 50 mL/min — ABNORMAL LOW (ref >60)
Glucose, Bld: 112 mg/dL — ABNORMAL HIGH (ref 70–99)
Potassium: 3 mmol/L — ABNORMAL LOW (ref 3.5–5.1)
Sodium: 139 mmol/L (ref 135–145)
Total Bilirubin: 1.2 mg/dL (ref 0.3–1.2)
Total Protein: 5.4 g/dL — ABNORMAL LOW (ref 6.5–8.1)

## 2023-01-22 LAB — MAGNESIUM: Magnesium: 2 mg/dL (ref 1.7–2.4)

## 2023-01-22 MED ORDER — POTASSIUM CHLORIDE 10 MEQ/100ML IV SOLN
10.0000 meq | INTRAVENOUS | Status: AC
  Administered 2023-01-22 (×3): 10 meq via INTRAVENOUS
  Filled 2023-01-22 (×3): qty 100

## 2023-01-22 MED ORDER — KCL IN DEXTROSE-NACL 40-5-0.45 MEQ/L-%-% IV SOLN
INTRAVENOUS | Status: DC
  Filled 2023-01-22 (×2): qty 1000

## 2023-01-22 NOTE — Progress Notes (Signed)
Speech Language Pathology Treatment: Dysphagia  Patient Details Name: Devin Vance MRN: [id number] DOB: 12/23/1927 Today's Date: 01/22/2023 Time: 1740-1801 SLP Time Calculation (min) (ACUTE ONLY): 21 min  Assessment / Plan / Recommendation Clinical Impression  SLP was approached by daughter Darl PikesSusan earlier in the day requesting SLP follow-up today. She hopes for dietary advancement. Upon arrival patient sleeping with hat pulled across his eyes. He benefited from moderate verbal and tactile stimulation to awaken to accept minimal intake he would consume. Patient verbalized during the session saying yes and I do not know with clear voice noted. SLP engaged patient with hand overhand to improve proprioception thus airway protection. During session patient consumed 3 boluses of water and sick boluses pudding thick Ensure. No indication of aspiration but he does conduct multiple swallows most notably with thin liquids due to piecemealing. He will require extra time to conduct multiple swallows for adequate oral clearance. Did not attempt solids that require mastication due to patient's lethargy. Advance diet to dysphagia 1 and thin and reviewed indication of strict aspiration precautions with patient family including most importantly assuring patient is fully alert and dissipating before or giving him any p.o. intake. Discussed risk of aspiration if patient lethargic and being fed. Concern continues for patient's adequacy of p.o. intake with appears more related to his dementia and ongoing lethargy. Again advised to continue maximizing liquid nutrition given level of patient's lethargy and work of intake. Note palliative continuing to follow patient. Daughter appreciative of all information provided and reports understanding to aspiration risk for feeding patient when he is sleepy.     HPI HPI: 89 y.o. male with medical history significant for Alzheimer's dementia, CAD, CKD stage IIIb, COPD, rheumatoid arthritis,  HTN, HLD, prostate cancer, OSA not using CPAP, recent hospitalization from 01/02/2023-12/07/2022 with COVID-19 infection treated with Paxlovid and oral steroids presented with worsening abdominal pain and nausea along with fever.  On presentation, WBC was 41, creatinine 1.58, AST 98, ALT 207, alkaline phosphatase 124, total bili of 2, lipase 24, lactic acid 3.4.  COVID-19 PCR positive.  Chest x-ray showed low lung volumes without focal consolidation, edema or effusion.  CT of abdomen/pelvis with contrast showed cholelithiasis with acute cholecystitis.  General surgery was consulted.  He was started on IV fluids and antibiotics.  Family denies patient having diagnosis of Parkinson's but do report that he participates in class that helps him with physical normal ability and and voiced drink.  They report his ambulation has diminished and his voice has weakened some since COVID.  Daughter reports patient with occasional cough with intake before admission, but wife denies this occurring.      SLP Plan  Continue with current plan of care      Recommendations for follow up therapy are one component of a multi-disciplinary discharge planning process, led by the attending physician.  Recommendations may be updated based on patient status, additional functional criteria and insurance authorization.    Recommendations  Diet recommendations: Dysphagia 1 (puree);Thin liquid;Other(comment) (Extra gravy and sauce) Liquids provided via: Cup;Straw Medication Administration: Crushed with puree Supervision: Trained caregiver to feed patient;Full supervision/cueing for compensatory strategies Compensations: Slow rate;Small sips/bites;Minimize environmental distractions Postural Changes and/or Swallow Maneuvers: Seated upright 90 degrees                  Oral care BID   Frequent or constant Supervision/Assistance Dysphagia, oral phase (R13.11)     Continue with current plan of care    Tammy K, MS [facility]  SLP Acute Rehab Services Office [phone]  Chales Abrahams  01/22/2023, 6:16 PM

## 2023-01-22 NOTE — TOC Initial Note (Addendum)
Transition of Care [facility]) - Initial/Assessment Note    Patient Details  Name: Devin Vance MRN: [id number] Date of Birth: 04/13/1928  [facility]) CM/SW Contact:    Howell Rucks, RN Phone Number: 01/22/2023, 11:30 AM  Clinical Narrative:  Met with patient in room, home private duty caregiver in room, called and  spoke with daughter Darl Pikes) on speaker phone, requesting options for home Palliative Care. Informed MD recommending short term SNF with Palliative Care.  Dtr concerned about SNF recommendation. Will await PT current assessment and recommendation. Will provide recommendation to dtr to follow up on options and dc plans. Spoke to [facility], rep [employer], confirmed levels of care-ILF, ALF, no short term SNF. Confirmed contract with [facility] for Palliative car services.  Continue to follow.  - awaiting PASSR and bed offers.                  Expected Discharge Plan:  (TBD) Barriers to Discharge: Continued Medical Work up   Patient Goals and CMS Choice   CMS Medicare.gov Compare Post Acute Care list provided to:: Patient Represenative (must comment) Darl Pikes (dtr)) Choice offered to / list presented to : Adult Children Darl Pikes (dtr))      Expected Discharge Plan and Services   Discharge Planning Services: CM Consult   Living arrangements for the past 2 months: Independent Living Facility [facility])                                      Prior Living Arrangements/Services Living arrangements for the past 2 months: Independent Living Facility ([facility]) Lives with:: Self Patient language and need for interpreter reviewed:: Yes Do you feel safe going back to the place where you live?: Yes      Need for Family Participation in Patient Care: Yes (Comment) Care giver support system in place?: Yes (comment) Current home services: DME, Other (comment) (RW, pvt duty care giver services/ 3 days/wk) Criminal Activity/Legal Involvement  Pertinent to Current Situation/Hospitalization: No - Comment as needed  Activities of Daily Living Home Assistive Devices/Equipment: Cane (specify quad or straight) ADL Screening (condition at time of admission) Patient's cognitive ability adequate to safely complete daily activities?: No Is the patient deaf or have difficulty hearing?: No Does the patient have difficulty seeing, even when wearing glasses/contacts?: No Does the patient have difficulty concentrating, remembering, or making decisions?: Yes Patient able to express need for assistance with ADLs?: Yes Does the patient have difficulty dressing or bathing?: Yes Independently performs ADLs?: No Communication: Independent Is this a change from baseline?: Pre-admission baseline Dressing (OT): Needs assistance Is this a change from baseline?: Pre-admission baseline Grooming: Needs assistance Is this a change from baseline?: Pre-admission baseline Feeding: Needs assistance Is this a change from baseline?: Pre-admission baseline Bathing: Needs assistance Is this a change from baseline?: Pre-admission baseline Toileting: Needs assistance Is this a change from baseline?: Pre-admission baseline In/Out Bed: Needs assistance Is this a change from baseline?: Pre-admission baseline Walks in Home: Needs assistance Is this a change from baseline?: Pre-admission baseline Does the patient have difficulty walking or climbing stairs?: Yes Weakness of Legs: Both Weakness of Arms/Hands: Both  Permission Sought/Granted Permission sought to share information with : Case Manager Permission granted to share information with : Yes, Verbal Permission Granted  Share Information with NAME: Fannie Knee, RN CM  Emotional Assessment Appearance:: Appears stated age Attitude/Demeanor/Rapport: Gracious Affect (typically observed): Accepting Orientation: : Oriented to Self Alcohol / Substance Use: Not Applicable Psych Involvement: No  (comment)  Admission diagnosis:  Acute calculous cholecystitis [K80.00] Sepsis, due to unspecified organism, unspecified whether acute organ dysfunction present [A41.9] Severe sepsis with lactic acidosis [A41.9, R65.20, E87.20] Patient Active Problem List   Diagnosis Date Noted   Acute calculous cholecystitis 01/16/2023   Hyperglycemia 01/16/2023   Coronary artery disease 01/16/2023   Hypothyroidism 01/16/2023   Prostate cancer 01/15/2023   Polymyalgia rheumatica 01/15/2023   Severe sepsis with lactic acidosis 01/15/2023   Lab test positive for detection of COVID-19 virus 01/02/2023   AAA (abdominal aortic aneurysm) 01/02/2023   Dilation of stomach 01/02/2023   Chronic diarrhea 01/02/2023   Alzheimer's dementia with agitation 06/02/2022   AMS (altered mental status) 02/16/2021   Gait abnormality 07/15/2019   Cardiomyopathy 03/24/2019   COPD (chronic obstructive pulmonary disease) 03/24/2019   HLD (hyperlipidemia) 03/24/2019   Acute renal failure superimposed on stage 3 chronic kidney disease 10/28/2018   Chronic kidney disease, stage 3b 10/28/2018   Rheumatoid arthritis involving multiple sites 10/28/2018   Dementia with behavioral disturbance 10/27/2018   Alzheimer's dementia with behavioral disturbance 07/31/2018   Essential hypertension 08/15/2017   Anemia, mild 02/12/2017   Olecranon bursitis of right elbow 02/12/2017   Kyphosis of cervical region 10/26/2015   Intermittent alternating esotropia 05/18/2015   GERD (gastroesophageal reflux disease) 01/22/2014   Angina pectoris 01/22/2014   PCP:  Georgann Housekeeper, MD Pharmacy:   [facility] [phone] Ginette Otto, [place] - [address] AT [facility] [address] [place] [address] Phone: [phone] Fax: [phone]  [place] [facility] [address] [place] [address] Phone: [phone] Fax: [phone]     Social Determinants of Health  (SDOH) Social History: SDOH Screenings   Food Insecurity: No Food Insecurity (01/16/2023)  Housing: Low Risk  (01/19/2023)  Transportation Needs: No Transportation Needs (01/19/2023)  Utilities: Not At Risk (01/19/2023)  Tobacco Use: Low Risk  (01/19/2023)   SDOH Interventions:     Readmission Risk Interventions    01/22/2023   11:25 AM 01/16/2023    2:27 PM  Readmission Risk Prevention Plan  Transportation Screening Complete Complete  PCP or Specialist Appt within 3-5 Days Complete Complete  HRI or Home Care Consult Complete Complete  Social Work Consult for Recovery Care Planning/Counseling Complete Complete  Palliative Care Screening Complete Complete  Medication Review Oceanographer) Complete Complete

## 2023-01-22 NOTE — Care Management Important Message (Signed)
Important Message  Patient Details IM Letter placed in Patients room. Name: Devin Vance MRN: [id number] Date of Birth: 30-Aug-1928   Medicare Important Message Given:  Yes     Caren Macadam 01/22/2023, 10:44 AM

## 2023-01-22 NOTE — TOC PASRR Note (Cosign Needed Addendum)
30 Day PASRR Note   Patient Details  Name: Devin Vance Date of Birth: [birth year]   Transition of Care [facility]) CM/SW Contact:    Lanier Clam, RN Phone Number: 01/22/2023, 4:15 PM  To Whom It May Concern:  Please be advised that this patient will require a short-term nursing home stay - anticipated 30 days or less for rehabilitation and strengthening.   The plan is for return home.

## 2023-01-22 NOTE — Progress Notes (Addendum)
Physical Therapy Treatment Patient Details Name: Devin Vance MRN: [id number] DOB: [birth year] Today's Date: 01/22/2023   History of Present Illness Pt is 89 yo male admitted on 01/15/23 with severe sepsis due to acute calculus cholecystitis.  Pt s/p IR guided percutaneous cholecystostomy tube on 01/16/23.  Pt with hx including recent hospitalization for COVID, Alzheimer's dementia, CAD, CKD stage IIIb, COPD, rheumatoid arthritis, HTN, HLD, prostate cancer, OSA not using CPAP    PT Comments    Caregiver present-reports pt was ambulatory with his rollator at home. During session today, pt was resistant to any and all movement. Unable to safely get him sitting EOB. He did not follow commands or initiate any movement. Repositioned pt in bed. Pt will require post acute rehab after this hospital stay-setting will depend on family input as well. If pt returns home, he will require 24/7 supervision/increased assistance.     Recommendations for follow up therapy are one component of a multi-disciplinary discharge planning process, led by the attending physician.  Recommendations may be updated based on patient status, additional functional criteria and insurance authorization.  Follow Up Recommendations  Can patient physically be transported by private vehicle: No    Assistance Recommended at Discharge Frequent or constant Supervision/Assistance  Patient can return home with the following Two people to help with walking and/or transfers;Two people to help with bathing/dressing/bathroom;Assist for transportation;Assistance with cooking/housework;Direct supervision/assist for medications management;Direct supervision/assist for financial management;Assistance with feeding   Equipment Recommendations  Wheelchair (measurements PT);Wheelchair cushion (measurements PT)    Recommendations for Other Services       Precautions / Restrictions Precautions Precautions: Fall Precaution Comments: Drain mid-high R  back Restrictions Weight Bearing Restrictions: No     Mobility  Bed Mobility Overal bed mobility: Needs Assistance Bed Mobility: Supine to Sit     Supine to sit: Total assist, +2 for physical assistance, +2 for safety/equipment     General bed mobility comments: Attempted supine to sit-pt resisting against therapit and tech. He did not follow commands or initiate any movement.    Transfers                        Ambulation/Gait                   Stairs             Wheelchair Mobility    Modified Rankin (Stroke Patients Only)       Balance Overall balance assessment: Needs assistance                                          Cognition Arousal/Alertness: Lethargic Behavior During Therapy: Flat affect Overall Cognitive Status: History of cognitive impairments - at baseline Area of Impairment: Orientation, Following commands                 Orientation Level: Disoriented to, Place, Time, Situation             General Comments: lethargic; eyes closed; did not follow any commands today        Exercises General Exercises - Upper Extremity-attempted to range UEs-pt resisting)  General Exercises - Lower Extremity-attempted to range LEs-pt resisting)       General Comments        Pertinent Vitals/Pain Pain Assessment Pain Assessment: Faces Faces Pain Scale: Hurts even more Pain Location: Pt  did not state. All movement appeared painful Pain Descriptors / Indicators: Grimacing, Moaning Pain Intervention(s): Limited activity within patient's tolerance, Monitored during session, Repositioned    Home Living                          Prior Function            PT Goals (current goals can now be found in the care plan section) Progress towards PT goals: Not progressing toward goals - comment    Frequency    Min 2X/week      PT Plan Current plan remains appropriate    Co-evaluation               AM-PAC PT "6 Clicks" Mobility   Outcome Measure  Help needed turning from your back to your side while in a flat bed without using bedrails?: Total Help needed moving from lying on your back to sitting on the side of a flat bed without using bedrails?: Total Help needed moving to and from a bed to a chair (including a wheelchair)?: Total Help needed standing up from a chair using your arms (e.g., wheelchair or bedside chair)?: Total Help needed to walk in hospital room?: Total Help needed climbing 3-5 steps with a railing? : Total 6 Click Score: 6    End of Session   Activity Tolerance:  (limited by cognition) Patient left: in bed;with call bell/phone within reach;with bed alarm set;with family/visitor present   PT Visit Diagnosis: Unsteadiness on feet (R26.81);Difficulty in walking, not elsewhere classified (R26.2);Muscle weakness (generalized) (M62.81)     Time: 1120-1140 PT Time Calculation (min) (ACUTE ONLY): 20 min  Charges:  $Therapeutic Activity: 8-22 mins                        Faye Ramsay, PT Acute Rehabilitation  Office: [phone]

## 2023-01-22 NOTE — Progress Notes (Signed)
PROGRESS NOTE    Devin Vance  JXB:[id number] DOB: 08/25/1928 DOA: 01/15/2023 PCP: Devin Vance   Brief Narrative:  89 y.o. male with medical history significant for Alzheimer's dementia, CAD, CKD stage IIIb, COPD, rheumatoid arthritis, HTN, HLD, prostate cancer, OSA not using CPAP, recent hospitalization from 01/02/2023-12/07/2022 with COVID-19 infection treated with Paxlovid and oral steroids presented with worsening abdominal pain and nausea along with fever.  On presentation, WBC was 41, creatinine 1.58, AST 98, ALT 207, alkaline phosphatase 124, total bili of 2, lipase 24, lactic acid 3.4.  COVID-19 PCR positive.  Chest x-ray showed low lung volumes without focal consolidation, edema or effusion.  CT of abdomen/pelvis with contrast showed cholelithiasis with acute cholecystitis.  General surgery was consulted.  He was started on IV fluids and antibiotics.  He underwent percutaneous cholecystostomy tube placement by IR on 01/16/2023.  Palliative care was also consulted.  Assessment & Plan:   Severe sepsis: Present on admission Acute calculus cholecystitis Elevated LFTs Lactic acidosis -Presented with significant leukocytosis, tachycardia, tachypnea, lactic acidosis and CT findings consistent with acute calculus cholecystitis -Continue Zosyn.  General surgery signed off on 01/18/2023.  Currently on full liquid diet.  Status post IR guided percutaneous cholecystostomy tube placement on 01/16/2023.  Continue pain management and IV fluids.  Oral intake remains poor.  Cultures negative so far -Monitor LFTs: Slightly trending upwards today.  Leukocytosis -Resolved.   CKD stage IIIb -monitor creatinine.  Creatinine at baseline  Hypokalemia -Replace.  Repeat a.m. labs  COVID-19 positive -Initially positive 01/02/2023 and treated with Paxlovid. This is a persistent positive and clinically nonsignificant.   CAD -Stable.  Aspirin on hold  Thrombocytopenia -No signs of bleeding.   Monitor.  Hypertension -Monitor blood pressure.  Blood pressures intermittently elevated.  Metoprolol resumed on 01/19/2023  Alzheimer's dementia Acute metabolic encephalopathy - Provigil, Seroquel on hold for now -Mental status worsened because of ongoing infection.  Improving slightly.  Still extremely slow to respond.  Namenda resumed on 01/19/2023 after discussion with family.  Dysphagia -SLP following.  Currently on full liquid diet.    Rheumatoid arthritis -Plaquenil on hold for now  Hypothyroidism--continue Synthroid  OSA -Does not use CPAP  Goals of care Poor oral intake -Oral intake remains poor.  If condition does not improve, patient might be a candidate for comfort measures/hospice.  Overall prognosis is guarded to poor.  Palliative care following  Physical deconditioning -PT recommends SNF placement.  TOC following.  DVT prophylaxis: SCDs Code Status: DNR Family Communication: Daughter and wife at bedside  Disposition Plan: Status is: Inpatient Remains inpatient appropriate because: Of severity of illness  Consultants: General surgery.  IR.  Palliative care  Procedures: As above  Antimicrobials: Zosyn from 01/15/23 onwards   Subjective: Patient seen and examined at bedside.  No fever, agitation, vomiting reported.  Oral intake still remains poor.   Objective: Vitals:   01/21/23 1420 01/21/23 2008 01/22/23 0420 01/22/23 0558  BP: (!) 155/87 (!) 153/74 (!) 169/71   Pulse: 61 65 67   Resp: 16 18 16    Temp: 98.1 F (36.7 C) 98.5 F (36.9 C) 98.2 F (36.8 C)   TempSrc: Oral Oral Oral   SpO2: 95% 96% 94%   Weight:    88.6 kg  Height:        Intake/Output Summary (Last 24 hours) at 01/22/2023 0746 Last data filed at 01/22/2023 0600 Gross per 24 hour  Intake 1774.16 ml  Output 1630 ml  Net 144.16 ml  Filed Weights   01/20/23 0500 01/21/23 0500 01/22/23 0558  Weight: 90.6 kg 91.5 kg 88.6 kg    Examination:  General: No distress.  On room air.   Looks chronically ill and deconditioned.  Elderly male lying in bed. ENT/neck: No obvious JVD elevation or palpable thyromegaly noted  respiratory: Bilateral decreased breath sounds at bases with scattered crackles CVS: Rate mostly controlled; S1 and S2 heard  abdominal: Soft, upper quadrant tenderness present; distended; no organomegaly, bowel sounds normally heard.  Right upper quadrant percutaneous tube present Extremities: No clubbing; trace lower extremity edema present.   CNS: Awake, still extremely slow to respond; confused.  Poor historian.  No focal neurologic deficit.  Able to move extremities  lymph: No palpable cervical lymphadenopathy noted skin: No obvious petechia/rashes psych: Flat affect.  Shows no signs of agitation Musculoskeletal: No obvious joint swelling/deformity     Data Reviewed: I have personally reviewed following labs and imaging studies  CBC: Recent Labs  Lab 01/18/23 0455 01/19/23 0532 01/20/23 0458 01/21/23 0425 01/22/23 0522  WBC 16.8* 11.3* 9.1 7.5 8.8  NEUTROABS 15.0* 9.8* 7.5 6.1 7.2  HGB 11.2* 9.8* 10.0* 10.4* 11.4*  HCT 35.9* 31.0* 31.5* 33.0* 35.5*  MCV 96.8 96.3 94.6 94.0 92.4  PLT 129* 115* 121* 131* 147*    Basic Metabolic Panel: Recent Labs  Lab 01/18/23 0455 01/19/23 0532 01/20/23 0458 01/21/23 0425 01/22/23 0522  NA 144 138 138 139 139  K 3.7 3.4* 3.3* 3.2* 3.0*  CL 111 107 109 106 106  CO2 27 26 25 27 27   GLUCOSE 118* 102* 111* 112* 112*  BUN 37* 32* 25* 20 17  CREATININE 1.68* 1.43* 1.35* 1.39* 1.32*  CALCIUM 8.1* 7.7* 7.8* 7.9* 8.0*  MG 2.3 2.1 1.9 2.0 2.0    GFR: Estimated Creatinine Clearance: 39.2 mL/min (A) (by C-G formula based on SCr of 1.32 mg/dL (H)). Liver Function Tests: Recent Labs  Lab 01/18/23 0455 01/19/23 0532 01/20/23 0458 01/21/23 0425 01/22/23 0522  AST 16 16 13* 29 61*  ALT 78* 55* 43 50* 97*  ALKPHOS 78 70 81 94 99  BILITOT 0.9 0.7 1.0 1.6* 1.2  PROT 5.2* 4.9* 5.1* 5.1* 5.4*  ALBUMIN  2.2* 2.1* 2.2* 2.2* 2.3*    Recent Labs  Lab 01/15/23 2108  LIPASE 24    No results for input(s): "AMMONIA" in the last 168 hours. Coagulation Profile: Recent Labs  Lab 01/15/23 2108  INR 1.1    Cardiac Enzymes: No results for input(s): "CKTOTAL", "CKMB", "CKMBINDEX", "TROPONINI" in the last 168 hours. BNP (last 3 results) No results for input(s): "PROBNP" in the last 8760 hours. HbA1C: No results for input(s): "HGBA1C" in the last 72 hours.  CBG: Recent Labs  Lab 01/21/23 1144 01/21/23 1612 01/21/23 2003 01/21/23 2357 01/22/23 0420  GLUCAP 109* 123* 122* 137* 102*    Lipid Profile: No results for input(s): "CHOL", "HDL", "LDLCALC", "TRIG", "CHOLHDL", "LDLDIRECT" in the last 72 hours. Thyroid Function Tests: No results for input(s): "TSH", "T4TOTAL", "FREET4", "T3FREE", "THYROIDAB" in the last 72 hours. Anemia Panel: No results for input(s): "VITAMINB12", "FOLATE", "FERRITIN", "TIBC", "IRON", "RETICCTPCT" in the last 72 hours. Sepsis Labs: Recent Labs  Lab 01/15/23 2108 01/15/23 2353 01/16/23 0514  PROCALCITON  --   --  1.49  LATICACIDVEN 3.4* 4.8* 2.0*     Recent Results (from the past 240 hour(s))  Blood Culture (routine x 2)     Status: None   Collection Time: 01/15/23  9:55 PM   Specimen:  BLOOD  Result Value Ref Range Status   Specimen Description   Final    BLOOD BLOOD RIGHT FOREARM Performed at [facility], [address], [place], [place] [address]    Special Requests   Final    BOTTLES DRAWN AEROBIC AND ANAEROBIC Blood Culture adequate volume Performed at [facility], [address], [place], [place] [address]    Culture   Final    NO GROWTH 5 DAYS Performed at [facility], [address], [place], [place] [address]    Report Status 01/20/2023 FINAL  Final  Blood Culture (routine x 2)     Status: None   Collection Time: 01/15/23 10:00 PM   Specimen: BLOOD  Result Value Ref Range Status    Specimen Description   Final    BLOOD RIGHT ANTECUBITAL Performed at [facility], [address] [place], [place] [address]    Special Requests   Final    BOTTLES DRAWN AEROBIC AND ANAEROBIC Blood Culture adequate volume Performed at [facility], [address], [place], [place] [address]    Culture   Final    NO GROWTH 5 DAYS Performed at [facility], [address] [place], [place] [address]    Report Status 01/20/2023 FINAL  Final  Resp panel by RT-PCR (RSV, Flu A&B, Covid) Anterior Nasal Swab     Status: Abnormal   Collection Time: 01/15/23 10:17 PM   Specimen: Anterior Nasal Swab  Result Value Ref Range Status   SARS Coronavirus 2 by RT PCR POSITIVE (A) NEGATIVE Final    Comment: (NOTE) SARS-CoV-2 target nucleic acids are DETECTED.  The SARS-CoV-2 RNA is generally detectable in upper respiratory specimens during the acute phase of infection. Positive results are indicative of the presence of the identified virus, but do not rule out bacterial infection or co-infection with other pathogens not detected by the test. Clinical correlation with patient history and other diagnostic information is necessary to determine patient infection status. The expected result is Negative.  Fact Sheet for Patients: BloggerCourse.com  Fact Sheet for Healthcare Providers: SeriousBroker.it  This test is not yet approved or cleared by the [place] FDA and  has been authorized for detection and/or diagnosis of SARS-CoV-2 by FDA under an Emergency Use Authorization (EUA).  This EUA will remain in effect (meaning this test can be used) for the duration of  the COVID-19 declaration under Section 564(b)(1) of the A ct, 21 U.S.C. section 360bbb-3(b)(1), unless the authorization is terminated or revoked sooner.     Influenza A by PCR NEGATIVE NEGATIVE Final   Influenza B by PCR NEGATIVE NEGATIVE  Final    Comment: (NOTE) The Xpert Xpress SARS-CoV-2/FLU/RSV plus assay is intended as an aid in the diagnosis of influenza from Nasopharyngeal swab specimens and should not be used as a sole basis for treatment. Nasal washings and aspirates are unacceptable for Xpert Xpress SARS-CoV-2/FLU/RSV testing.  Fact Sheet for Patients: BloggerCourse.com  Fact Sheet for Healthcare Providers: SeriousBroker.it  This test is not yet approved or cleared by the [place] FDA and has been authorized for detection and/or diagnosis of SARS-CoV-2 by FDA under an Emergency Use Authorization (EUA). This EUA will remain in effect (meaning this test can be used) for the duration of the COVID-19 declaration under Section 564(b)(1) of the Act, 21 U.S.C. section 360bbb-3(b)(1), unless the authorization is terminated or revoked.     Resp Syncytial Virus by PCR NEGATIVE NEGATIVE  Final    Comment: (NOTE) Fact Sheet for Patients: BloggerCourse.com  Fact Sheet for Healthcare Providers: SeriousBroker.it  This test is not yet approved or cleared by the [place] FDA and has been authorized for detection and/or diagnosis of SARS-CoV-2 by FDA under an Emergency Use Authorization (EUA). This EUA will remain in effect (meaning this test can be used) for the duration of the COVID-19 declaration under Section 564(b)(1) of the Act, 21 U.S.C. section 360bbb-3(b)(1), unless the authorization is terminated or revoked.  Performed at [facility], [address] [place], [place] [address]   Aerobic/Anaerobic Culture w Gram Stain (surgical/deep wound)     Status: None   Collection Time: 01/16/23  3:12 PM   Specimen: BILE  Result Value Ref Range Status   Specimen Description   Final    BILE Performed at [facility], [address] [place], [place] [address]    Special  Requests   Final    NONE Performed at [facility], [address] [place], [place] [address]    Gram Stain NO WBC SEEN NO ORGANISMS SEEN   Final   Culture   Final    No growth aerobically or anaerobically. Performed at [facility], [address] [place], [place] [address]    Report Status 01/21/2023 FINAL  Final         Radiology Studies: No results found.      Scheduled Meds:  insulin aspart  0-6 Units Subcutaneous Q4H   levothyroxine  25 mcg Oral Q0600   memantine  10 mg Oral BID   metoprolol succinate  12.5 mg Oral Daily   mouth rinse  15 mL Mouth Rinse 4 times per day   senna-docusate  1 tablet Oral BID   sodium chloride flush  3 mL Intravenous Q12H   sodium chloride flush  5 mL Intracatheter Q8H   Continuous Infusions:  dextrose 5 % and 0.45% NaCl 50 mL/hr at 01/21/23 1834   piperacillin-tazobactam (ZOSYN)  IV 3.375 g (01/22/23 0227)          Glade Lloyd, Vance [facility] 01/22/2023, 7:46 AM

## 2023-01-22 NOTE — NC FL2 (Signed)
[place] MEDICAID FL2 LEVEL OF CARE FORM     IDENTIFICATION  Patient Name: Devin Vance Birthdate: 1928-03-03 Sex: male Admission Date (Current Location): 01/15/2023  [facility] and [place] Number:  Producer, television/film/video and Address:  [facility],  [address] [place], [place] [address]      Provider Number: [id number]  Attending Physician Name and Address:  Glade Lloyd, MD  Relative Name and Phone Number:  Darl Pikes (dtr)    Current Level of Care: Hospital Recommended Level of Care: Skilled Nursing Facility Prior Approval Number:    Date Approved/Denied:   PASRR Number:  [id number]  Discharge Plan: SNF    Current Diagnoses: Patient Active Problem List   Diagnosis Date Noted   Acute calculous cholecystitis 01/16/2023   Hyperglycemia 01/16/2023   Coronary artery disease 01/16/2023   Hypothyroidism 01/16/2023   Prostate cancer 01/15/2023   Polymyalgia rheumatica 01/15/2023   Severe sepsis with lactic acidosis 01/15/2023   Lab test positive for detection of COVID-19 virus 01/02/2023   AAA (abdominal aortic aneurysm) 01/02/2023   Dilation of stomach 01/02/2023   Chronic diarrhea 01/02/2023   Alzheimer's dementia with agitation 06/02/2022   AMS (altered mental status) 02/16/2021   Gait abnormality 07/15/2019   Cardiomyopathy 03/24/2019   COPD (chronic obstructive pulmonary disease) 03/24/2019   HLD (hyperlipidemia) 03/24/2019   Acute renal failure superimposed on stage 3 chronic kidney disease 10/28/2018   Chronic kidney disease, stage 3b 10/28/2018   Rheumatoid arthritis involving multiple sites 10/28/2018   Dementia with behavioral disturbance 10/27/2018   Alzheimer's dementia with behavioral disturbance 07/31/2018   Essential hypertension 08/15/2017   Anemia, mild 02/12/2017   Olecranon bursitis of right elbow 02/12/2017   Kyphosis of cervical region 10/26/2015   Intermittent alternating esotropia 05/18/2015   GERD (gastroesophageal reflux disease)  01/22/2014   Angina pectoris 01/22/2014    Orientation RESPIRATION BLADDER Height & Weight     Self  Normal Incontinent Weight: 88.6 kg Height:  5\' 10"  (177.8 cm)  BEHAVIORAL SYMPTOMS/MOOD NEUROLOGICAL BOWEL NUTRITION STATUS      Incontinent Diet (full liquids, Dysphagia)  AMBULATORY STATUS COMMUNICATION OF NEEDS Skin   Extensive Assist Verbally Other (Comment) (right side blisters, covered in gauze and tape)                       Personal Care Assistance Level of Assistance  Bathing, Feeding, Dressing Bathing Assistance: Maximum assistance Feeding assistance: Maximum assistance Dressing Assistance: Maximum assistance     Functional Limitations Info  Sight, Hearing, Speech Sight Info: Adequate Hearing Info: Adequate Speech Info: Adequate    SPECIAL CARE FACTORS FREQUENCY  PT (By licensed PT), OT (By licensed OT)     PT Frequency: 5x/wk OT Frequency: 5x/wk            Contractures Contractures Info: Not present    Additional Factors Info  Code Status Code Status Info: DNR Allergies Info: Lorazepam Psychotropic Info: Namenda         Current Medications (01/22/2023):  This is the current hospital active medication list Current Facility-Administered Medications  Medication Dose Route Frequency Provider Last Rate Last Admin   acetaminophen (TYLENOL) tablet 650 mg  650 mg Oral Q6H PRN Charlsie Quest, MD       Or   acetaminophen (TYLENOL) suppository 650 mg  650 mg Rectal Q6H PRN Darreld Mclean R, MD       dextrose 5 % and 0.45 % NaCl with KCl 40 mEq/L infusion  Intravenous Continuous Glade Lloyd, MD 50 mL/hr at 01/22/23 0916 New Bag at 01/22/23 0916   insulin aspart (novoLOG) injection 0-6 Units  0-6 Units Subcutaneous Q4H Darreld Mclean R, MD   1 Units at 01/18/23 1630   ipratropium-albuterol (DUONEB) 0.5-2.5 (3) MG/3ML nebulizer solution 3 mL  3 mL Nebulization Q6H PRN Glade Lloyd, MD       levothyroxine (SYNTHROID) tablet 25 mcg  25 mcg Oral Q0600  Glade Lloyd, MD   25 mcg at 01/22/23 0559   memantine (NAMENDA) tablet 10 mg  10 mg Oral BID Glade Lloyd, MD   10 mg at 01/22/23 0914   metoprolol succinate (TOPROL-XL) 24 hr tablet 12.5 mg  12.5 mg Oral Daily Alekh, Kshitiz, MD   12.5 mg at 01/22/23 0914   morphine (PF) 2 MG/ML injection 1 mg  1 mg Intravenous Q2H PRN Glade Lloyd, MD   1 mg at 01/20/23 2045   ondansetron (ZOFRAN) tablet 4 mg  4 mg Oral Q6H PRN Charlsie Quest, MD       Or   ondansetron (ZOFRAN) injection 4 mg  4 mg Intravenous Q6H PRN Charlsie Quest, MD       Oral care mouth rinse  15 mL Mouth Rinse 4 times per day Glade Lloyd, MD   15 mL at 01/22/23 1150   Oral care mouth rinse  15 mL Mouth Rinse PRN Glade Lloyd, MD       Oral care mouth rinse  15 mL Mouth Rinse PRN Hanley Ben, Kshitiz, MD       piperacillin-tazobactam (ZOSYN) IVPB 3.375 g  3.375 g Intravenous Q8H Patel, Vishal R, MD 12.5 mL/hr at 01/22/23 0923 3.375 g at 01/22/23 0923   polyethylene glycol (MIRALAX / GLYCOLAX) packet 17 g  17 g Oral Daily PRN Glade Lloyd, MD       senna-docusate (Senokot-S) tablet 1 tablet  1 tablet Oral BID Glade Lloyd, MD   1 tablet at 01/22/23 0914   sodium chloride flush (NS) 0.9 % injection 3 mL  3 mL Intravenous Q12H Darreld Mclean R, MD   3 mL at 01/21/23 2205   sodium chloride flush (NS) 0.9 % injection 5 mL  5 mL Intracatheter Q8H Oley Balm, MD   5 mL at 01/22/23 1151     Discharge Medications: Please see discharge summary for a list of discharge medications.  Relevant Imaging Results:  Relevant Lab Results:   Additional Information SSN# [id number]  Lanier Clam, RN

## 2023-01-23 DIAGNOSIS — R6520 Severe sepsis without septic shock: Secondary | ICD-10-CM | POA: Diagnosis not present

## 2023-01-23 DIAGNOSIS — A419 Sepsis, unspecified organism: Secondary | ICD-10-CM | POA: Diagnosis not present

## 2023-01-23 DIAGNOSIS — E872 Acidosis, unspecified: Secondary | ICD-10-CM | POA: Diagnosis not present

## 2023-01-23 LAB — CBC WITH DIFFERENTIAL/PLATELET
Abs Immature Granulocytes: 0.05 10*3/uL (ref 0.00–0.07)
Basophils Absolute: 0 10*3/uL (ref 0.0–0.1)
Basophils Relative: 0 %
Eosinophils Absolute: 0.4 10*3/uL (ref 0.0–0.5)
Eosinophils Relative: 5 %
HCT: 34.3 % — ABNORMAL LOW (ref 39.0–52.0)
Hemoglobin: 10.9 g/dL — ABNORMAL LOW (ref 13.0–17.0)
Immature Granulocytes: 1 %
Lymphocytes Relative: 12 %
Lymphs Abs: 1 10*3/uL (ref 0.7–4.0)
MCH: 29.8 pg (ref 26.0–34.0)
MCHC: 31.8 g/dL (ref 30.0–36.0)
MCV: 93.7 fL (ref 80.0–100.0)
Monocytes Absolute: 0.3 10*3/uL (ref 0.1–1.0)
Monocytes Relative: 4 %
Neutro Abs: 6.5 10*3/uL (ref 1.7–7.7)
Neutrophils Relative %: 78 %
Platelets: 171 10*3/uL (ref 150–400)
RBC: 3.66 MIL/uL — ABNORMAL LOW (ref 4.22–5.81)
RDW: 13.4 % (ref 11.5–15.5)
WBC: 8.3 10*3/uL (ref 4.0–10.5)
nRBC: 0 % (ref 0.0–0.2)

## 2023-01-23 LAB — GLUCOSE, CAPILLARY
Glucose-Capillary: 106 mg/dL — ABNORMAL HIGH (ref 70–99)
Glucose-Capillary: 109 mg/dL — ABNORMAL HIGH (ref 70–99)
Glucose-Capillary: 112 mg/dL — ABNORMAL HIGH (ref 70–99)
Glucose-Capillary: 119 mg/dL — ABNORMAL HIGH (ref 70–99)
Glucose-Capillary: 127 mg/dL — ABNORMAL HIGH (ref 70–99)
Glucose-Capillary: 150 mg/dL — ABNORMAL HIGH (ref 70–99)

## 2023-01-23 LAB — COMPREHENSIVE METABOLIC PANEL
ALT: 95 U/L — ABNORMAL HIGH (ref 0–44)
AST: 47 U/L — ABNORMAL HIGH (ref 15–41)
Albumin: 2.3 g/dL — ABNORMAL LOW (ref 3.5–5.0)
Alkaline Phosphatase: 84 U/L (ref 38–126)
Anion gap: 6 (ref 5–15)
BUN: 19 mg/dL (ref 8–23)
CO2: 26 mmol/L (ref 22–32)
Calcium: 7.8 mg/dL — ABNORMAL LOW (ref 8.9–10.3)
Chloride: 105 mmol/L (ref 98–111)
Creatinine, Ser: 1.47 mg/dL — ABNORMAL HIGH (ref 0.61–1.24)
GFR, Estimated: 44 mL/min — ABNORMAL LOW (ref >60)
Glucose, Bld: 109 mg/dL — ABNORMAL HIGH (ref 70–99)
Potassium: 3.4 mmol/L — ABNORMAL LOW (ref 3.5–5.1)
Sodium: 137 mmol/L (ref 135–145)
Total Bilirubin: 0.8 mg/dL (ref 0.3–1.2)
Total Protein: 5.2 g/dL — ABNORMAL LOW (ref 6.5–8.1)

## 2023-01-23 LAB — MAGNESIUM: Magnesium: 2 mg/dL (ref 1.7–2.4)

## 2023-01-23 MED ORDER — ACETAMINOPHEN 650 MG RE SUPP: 650.0000 mg | Freq: Three times a day (TID) | RECTAL | Status: DC | PRN

## 2023-01-23 MED ORDER — ACETAMINOPHEN 500 MG PO TABS
1000.0000 mg | ORAL_TABLET | Freq: Three times a day (TID) | ORAL | Status: DC | PRN
  Administered 2023-01-24: 1000 mg via ORAL
  Filled 2023-01-23: qty 2

## 2023-01-23 MED ORDER — ACETAMINOPHEN 650 MG RE SUPP: 650.0000 mg | Freq: Four times a day (QID) | RECTAL | Status: DC | PRN

## 2023-01-23 MED ORDER — HYDROCODONE-ACETAMINOPHEN 5-325 MG PO TABS: 1.0000 | ORAL_TABLET | Freq: Four times a day (QID) | ORAL | Status: DC | PRN

## 2023-01-23 MED ORDER — ACETAMINOPHEN 500 MG PO TABS: 1000.0000 mg | ORAL_TABLET | Freq: Four times a day (QID) | ORAL | Status: DC | PRN

## 2023-01-23 MED ORDER — POTASSIUM CHLORIDE 20 MEQ PO PACK
40.0000 meq | PACK | Freq: Once | ORAL | Status: AC
  Administered 2023-01-23: 40 meq via ORAL
  Filled 2023-01-23: qty 2

## 2023-01-23 MED ORDER — HYDROCODONE-ACETAMINOPHEN 5-325 MG PO TABS: 1.0000 | ORAL_TABLET | Freq: Two times a day (BID) | ORAL | Status: DC | PRN

## 2023-01-23 NOTE — Progress Notes (Addendum)
Occupational Therapy Treatment Patient Details Name: Devin Vance MRN: [id number] DOB: 1928/03/15 Today's Date: 01/23/2023   History of present illness Pt is 89 yo male admitted on 01/15/23 with severe sepsis due to acute calculus cholecystitis.  Pt s/p IR guided percutaneous cholecystostomy tube on 01/16/23.  Pt with hx including recent hospitalization for COVID, Alzheimer's dementia, CAD, CKD stage IIIb, COPD, rheumatoid arthritis, HTN, HLD, prostate cancer, OSA not using CPAP   OT comments  This 89 yo male admitted with above seen today to work on bed mobility, sitting balance, standing, and following commands. Compared to last time seen by PT he did not resist movement to come up to sit, but did go against himself with sit>stand.His balance progressed from poor to fair while seated EOB and working on balance. He had trouble initiating and following some commands. He will continue to benefit from acute OT with continued inpatient follow up therapy, <3 hours/day.    Recommendations for follow up therapy are one component of a multi-disciplinary discharge planning process, led by the attending physician.  Recommendations may be updated based on patient status, additional functional criteria and insurance authorization.    Assistance Recommended at Discharge Frequent or constant Supervision/Assistance  Patient can return home with the following  Two people to help with walking and/or transfers;Two people to help with bathing/dressing/bathroom;Assistance with cooking/housework;Assistance with feeding;Help with stairs or ramp for entrance;Assist for transportation;Direct supervision/assist for financial management;Direct supervision/assist for medications management   Equipment Recommendations  Other (comment) (TBD next venue)       Precautions / Restrictions Precautions Precautions: Fall Precaution Comments: Drain mid-high R back Restrictions Weight Bearing Restrictions: No Other  Position/Activity Restrictions: RA in middle finger right hand       Mobility Bed Mobility Overal bed mobility: Needs Assistance Bed Mobility: Supine to Sit, Sit to Supine     Supine to sit: Total assist, +2 for physical assistance Sit to supine: Total assist, +2 for physical assistance   General bed mobility comments: Pt did not resist movements today and he had difficulty initiating movements    Transfers Overall transfer level: Needs assistance Equipment used: Rollator (4 wheels) Transfers: Sit to/from Stand Sit to Stand: Total assist, +2 physical assistance, From elevated surface           General transfer comment: Attempted to stand x 3 with +2 A pt pushing on rollator and leaning posteriorly. Even from raised bed and use of chuck pad paired with gait belt he was not able to stand or clear buttocks from bed     Balance Overall balance assessment: Needs assistance Sitting-balance support: Bilateral upper extremity supported, Feet supported   Sitting balance - Comments: started at poor and progressed to fair                                   ADL either performed or assessed with clinical judgement   ADL Overall ADL's : Needs assistance/impaired                                       General ADL Comments: remains Max-total A for all basic ADLs at sitting EOB or supine in bed level    Extremity/Trunk Assessment Upper Extremity Assessment Upper Extremity Assessment: Generalized weakness RUE Deficits / Details: right middle finger pain from RA  Vision Patient Visual Report: No change from baseline            Cognition Arousal/Alertness: Lethargic Behavior During Therapy: Flat affect Overall Cognitive Status: History of cognitive impairments - at baseline Area of Impairment: Following commands, Safety/judgement, Problem solving                       Following Commands: Follows one step commands  inconsistently, Follows one step commands with increased time Safety/Judgement: Decreased awareness of safety, Decreased awareness of deficits   Problem Solving: Slow processing, Decreased initiation, Difficulty sequencing, Requires verbal cues, Requires tactile cues                     Pertinent Vitals/ Pain       Pain Assessment Pain Assessment: PAINAD Breathing: normal Negative Vocalization: occasional moan/groan, low speech, negative/disapproving quality Facial Expression: sad, frightened, frown Body Language: tense, distressed pacing, fidgeting Consolability: distracted or reassured by voice/touch PAINAD Score: 4 Pain Location: did not state where, but alot of moaning and groaning with getting to EOB and attempting sit<>stand Pain Descriptors / Indicators: Grimacing, Moaning Pain Intervention(s): Limited activity within patient's tolerance, Monitored during session, Repositioned         Frequency  Min 2X/week        Progress Toward Goals  OT Goals(current goals can now be found in the care plan section)  Progress towards OT goals: Progressing toward goals (slowly)  Acute Rehab OT Goals Patient Stated Goal: family would really like him to improve enough to take him home OT Goal Formulation: With family Time For Goal Achievement: 02/04/23 Potential to Achieve Goals: Fair  Plan Discharge plan remains appropriate       AM-PAC OT "6 Clicks" Daily Activity     Outcome Measure   Help from another person eating meals?: Total Help from another person taking care of personal grooming?: Total Help from another person toileting, which includes using toliet, bedpan, or urinal?: Total Help from another person bathing (including washing, rinsing, drying)?: Total Help from another person to put on and taking off regular upper body clothing?: Total Help from another person to put on and taking off regular lower body clothing?: Total 6 Click Score: 6    End of Session  Equipment Utilized During Treatment: Gait belt;Rollator (4 wheels)  OT Visit Diagnosis: Muscle weakness (generalized) (M62.81);Pain;Other symptoms and signs involving cognitive function;Other abnormalities of gait and mobility (R26.89) Pain - Right/Left: Right Pain - part of body: Hand   Activity Tolerance Patient limited by lethargy;Patient limited by fatigue   Patient Left in bed (with NT and rehab tech getting pt cleaned up from bowel movement at bed levle)           Time: 7628-3151 OT Time Calculation (min): 33 min  Charges: OT General Charges $OT Visit: 1 Visit OT Treatments $Therapeutic Activity: 23-37 mins  Lindon Romp OT Acute Rehabilitation Services Office [phone]    Evette Georges 01/23/2023, 2:42 PM

## 2023-01-23 NOTE — Plan of Care (Signed)
  Problem: Education: Goal: Knowledge of risk factors and measures for prevention of condition will improve Outcome: Progressing   Problem: Coping: Goal: Psychosocial and spiritual needs will be supported Outcome: Progressing   Problem: Respiratory: Goal: Will maintain a patent airway Outcome: Progressing   Problem: Clinical Measurements: Goal: Diagnostic test results will improve Outcome: Progressing Goal: Signs and symptoms of infection will decrease Outcome: Progressing   Problem: Fluid Volume: Goal: Ability to maintain a balanced intake and output will improve Outcome: Progressing   Problem: Health Behavior/Discharge Planning: Goal: Ability to manage health-related needs will improve Outcome: Progressing

## 2023-01-23 NOTE — Progress Notes (Signed)
PROGRESS NOTE    Devin Vance  OEH:[id number] DOB: [birth year] DOA: 01/15/2023 PCP: Georgann Housekeeper, MD   Brief Narrative: Devin Vance is a 89 y.o. male with a history of Alzheimer's dementia, CAD, CKD stage IIIb, COPD, rheumatoid arthritis, hypertension, hyperlipidemia, prostate cancer, OSA not on CPAP.  Patient presented secondary to abdominal pain and nausea and was found to have evidence of acute cholecystitis with associated severe sepsis.  Patient was started on empiric antibiotics.  General surgery consulted and recommended no surgical management secondary to comorbidities.  Interventional radiology consulted and performed percutaneous cholecystomy tube placement with improvement in symptoms.  Culture data without identification of an organism.  Hospitalization complicated by decreased oral intake which complicated by underlying dementia.   Assessment and Plan:  Severe sepsis Present on admission and secondary to acute cholecystitis. Empiric Cefepime and Flagyl started with transition to Zosyn IV. Blood cultures obtained and are no growth. Sepsis physiology improved with treatment of acute cholecystitis.  Acute calculus cholecystitis Present on admission with associated severe sepsis. Empiric Zosyn started. General surgery consulted. Per general surgery patient is not a surgical candidate. IR consulted and placed a percutaneous cholecystostomy tube on 4/3. Drain culture without an identified organism. -Continue Zosyn IV -Will discuss with ID regarding recommendations for outpatient antibiotic regimen and duration; anticipate he will need antibiotics for as long as his drain remains  Leukocytosis WBC of 41,000 on admission secondary to severe sepsis/acute cholecystitis. Resolved with treatment of infection.  CKD stage IIIb Stable.  Hypokalemia Recurrent. Potassium of 3.4 this morning on BMP -Potassium supplementation -Recheck potassium in AM  COVID-19 positive test Patient  initially tested positive on 3/20 and remained positive on test two weeks after initial positive test. Patient not treated for COVID-19 infection.  CAD No chest pain.  Thrombocytopenia Secondary to acute illness. Resolved.  Primary hypertension -Continue metoprolol succinate  Alzheimer's dementia -Continue Namenda  Dysphagia Patient evaluated by speech therapy. -Speech therapy recommendations (4/9): Diet recommendations: Dysphagia 1 (puree);Thin liquid;Other(comment) (Extra gravy and sauce) Liquids provided via: Cup;Straw Medication Administration: Crushed with puree Supervision: Trained caregiver to feed patient;Full supervision/cueing for compensatory strategies Compensations: Slow rate;Small sips/bites;Minimize environmental distractions Postural Changes and/or Swallow Maneuvers: Seated upright 90 degrees  Rheumatoid arthritis Patient is on Plaquenil as an outpatient which is on hold secondary to acute infection.  Hypothyroidism -Continue Synthroid 25 mcg daily  OSA Not using CPAP   DVT prophylaxis: SCDs Code Status:   Code Status: DNR Family Communication: Wife, daughter and caregiver at bedside Disposition Plan: Discharge to SNF when bed is available. Medically stable for discharge likely in 24 hours once transitioned to outpatient antibiotic regimen   Consultants:  General surgery Interventional radiology Palliative care medicine  Procedures:  4/3: [place] and fluoro guided perc cholecystostomy catheter placement  Antimicrobials: Cefepime Flagyl Zosyn IV    Subjective: Patient not answering questions. Per wife and daughter, patient has been mostly improving except for interaction. He is eating a bit better. They are hopeful he will improve with physical therapy.  Objective: BP 128/71   Pulse 67   Temp (!) 97.5 F (36.4 C) (Oral)   Resp 20   Ht 5\' 10"  (1.778 m)   Wt 90.9 kg   SpO2 95%   BMI 28.75 kg/m   Examination:  General exam: Appears calm  and comfortable Respiratory system: Clear to auscultation. Respiratory effort normal. Cardiovascular system: S1 & S2 heard, RRR. No murmurs. Gastrointestinal system: Abdomen is nondistended, soft and possibly mildly tender. No organomegaly  or masses felt. Normal bowel sounds heard. Central nervous system: Does not open his eyes but appears to be aware of his surroundings Musculoskeletal: No edema. No calf tenderness Skin: No cyanosis. No rashes    Data Reviewed: I have personally reviewed following labs and imaging studies  CBC Lab Results  Component Value Date   WBC 8.3 01/23/2023   RBC 3.66 (L) 01/23/2023   HGB 10.9 (L) 01/23/2023   HCT 34.3 (L) 01/23/2023   MCV 93.7 01/23/2023   MCH 29.8 01/23/2023   PLT 171 01/23/2023   MCHC 31.8 01/23/2023   RDW 13.4 01/23/2023   LYMPHSABS 1.0 01/23/2023   MONOABS 0.3 01/23/2023   EOSABS 0.4 01/23/2023   BASOSABS 0.0 01/23/2023     Last metabolic panel Lab Results  Component Value Date   NA 137 01/23/2023   K 3.4 (L) 01/23/2023   CL 105 01/23/2023   CO2 26 01/23/2023   BUN 19 01/23/2023   CREATININE 1.47 (H) 01/23/2023   GLUCOSE 109 (H) 01/23/2023   GFRNONAA 44 (L) 01/23/2023   GFRAA 47 (L) 07/15/2019   CALCIUM 7.8 (L) 01/23/2023   PROT 5.2 (L) 01/23/2023   ALBUMIN 2.3 (L) 01/23/2023   LABGLOB 1.9 07/15/2019   AGRATIO 2.1 07/15/2019   BILITOT 0.8 01/23/2023   ALKPHOS 84 01/23/2023   AST 47 (H) 01/23/2023   ALT 95 (H) 01/23/2023   ANIONGAP 6 01/23/2023    GFR: Estimated Creatinine Clearance: 35.6 mL/min (A) (by C-G formula based on SCr of 1.47 mg/dL (H)).  Recent Results (from the past 240 hour(s))  Blood Culture (routine x 2)     Status: None   Collection Time: 01/15/23  9:55 PM   Specimen: BLOOD  Result Value Ref Range Status   Specimen Description   Final    BLOOD BLOOD RIGHT FOREARM Performed at [facility], [address] [place], [place] [address]    Special Requests   Final    BOTTLES  DRAWN AEROBIC AND ANAEROBIC Blood Culture adequate volume Performed at [facility], [address] [place], [place] [address]    Culture   Final    NO GROWTH 5 DAYS Performed at [facility], [address] [place], [place] [address]    Report Status 01/20/2023 FINAL  Final  Blood Culture (routine x 2)     Status: None   Collection Time: 01/15/23 10:00 PM   Specimen: BLOOD  Result Value Ref Range Status   Specimen Description   Final    BLOOD RIGHT ANTECUBITAL Performed at [facility], [address] [place], [place] [address]    Special Requests   Final    BOTTLES DRAWN AEROBIC AND ANAEROBIC Blood Culture adequate volume Performed at [facility], [address] [place], [place] [address]    Culture   Final    NO GROWTH 5 DAYS Performed at [facility], [address] [place], [place] [address]    Report Status 01/20/2023 FINAL  Final  Resp panel by RT-PCR (RSV, Flu A&B, Covid) Anterior Nasal Swab     Status: Abnormal   Collection Time: 01/15/23 10:17 PM   Specimen: Anterior Nasal Swab  Result Value Ref Range Status   SARS Coronavirus 2 by RT PCR POSITIVE (A) NEGATIVE Final    Comment: (NOTE) SARS-CoV-2 target nucleic acids are DETECTED.  The SARS-CoV-2 RNA is generally detectable in upper respiratory specimens during the acute phase of infection. Positive results  are indicative of the presence of the identified virus, but do not rule out bacterial infection or co-infection with other pathogens not detected by the test. Clinical correlation with patient history and other diagnostic information is necessary to determine patient infection status. The expected result is Negative.  Fact Sheet for Patients: BloggerCourse.com  Fact Sheet for Healthcare Providers: SeriousBroker.it  This test is not yet approved or cleared by the [place] FDA and   has been authorized for detection and/or diagnosis of SARS-CoV-2 by FDA under an Emergency Use Authorization (EUA).  This EUA will remain in effect (meaning this test can be used) for the duration of  the COVID-19 declaration under Section 564(b)(1) of the A ct, 21 U.S.C. section 360bbb-3(b)(1), unless the authorization is terminated or revoked sooner.     Influenza A by PCR NEGATIVE NEGATIVE Final   Influenza B by PCR NEGATIVE NEGATIVE Final    Comment: (NOTE) The Xpert Xpress SARS-CoV-2/FLU/RSV plus assay is intended as an aid in the diagnosis of influenza from Nasopharyngeal swab specimens and should not be used as a sole basis for treatment. Nasal washings and aspirates are unacceptable for Xpert Xpress SARS-CoV-2/FLU/RSV testing.  Fact Sheet for Patients: BloggerCourse.com  Fact Sheet for Healthcare Providers: SeriousBroker.it  This test is not yet approved or cleared by the [place] FDA and has been authorized for detection and/or diagnosis of SARS-CoV-2 by FDA under an Emergency Use Authorization (EUA). This EUA will remain in effect (meaning this test can be used) for the duration of the COVID-19 declaration under Section 564(b)(1) of the Act, 21 U.S.C. section 360bbb-3(b)(1), unless the authorization is terminated or revoked.     Resp Syncytial Virus by PCR NEGATIVE NEGATIVE Final    Comment: (NOTE) Fact Sheet for Patients: BloggerCourse.com  Fact Sheet for Healthcare Providers: SeriousBroker.it  This test is not yet approved or cleared by the [place] FDA and has been authorized for detection and/or diagnosis of SARS-CoV-2 by FDA under an Emergency Use Authorization (EUA). This EUA will remain in effect (meaning this test can be used) for the duration of the COVID-19 declaration under Section 564(b)(1) of the Act, 21 U.S.C. section 360bbb-3(b)(1), unless  the authorization is terminated or revoked.  Performed at [facility], [address], [place], [place] [address]   Aerobic/Anaerobic Culture w Gram Stain (surgical/deep wound)     Status: None   Collection Time: 01/16/23  3:12 PM   Specimen: BILE  Result Value Ref Range Status   Specimen Description   Final    BILE Performed at [facility], [address], [place], [place] [address]    Special Requests   Final    NONE Performed at [facility], [address] [place], [place] [address]    Gram Stain NO WBC SEEN NO ORGANISMS SEEN   Final   Culture   Final    No growth aerobically or anaerobically. Performed at [facility], [address] [place], [place] [address]    Report Status 01/21/2023 FINAL  Final      Radiology Studies: No results found.    LOS: 8 days    Jacquelin Hawking, MD [facility] 01/23/2023, 12:37 PM   If 7PM-7AM, please contact night-coverage [email or web address]

## 2023-01-23 NOTE — TOC Progression Note (Signed)
Transition of Care [facility]) - Progression Note    Patient Details  Name: Devin Vance MRN: [id number] Date of Birth: 12-18-1927  Transition of [facility]) CM/SW Contact  Labresha Mellor, Olegario Messier, RN Phone Number: 01/23/2023, 4:04 PM  Clinical Narrative: Awaiting pasrr, & bed offers.      Expected Discharge Plan:  (TBD) Barriers to Discharge: Continued Medical Work up  Expected Discharge Plan and Services   Discharge Planning Services: CM Consult   Living arrangements for the past 2 months: Independent Living Facility ([facility])                                       Social Determinants of Health (SDOH) Interventions SDOH Screenings   Food Insecurity: No Food Insecurity (01/16/2023)  Housing: Low Risk  (01/19/2023)  Transportation Needs: No Transportation Needs (01/19/2023)  Utilities: Not At Risk (01/19/2023)  Tobacco Use: Low Risk  (01/19/2023)    Readmission Risk Interventions    01/22/2023   11:25 AM 01/16/2023    2:27 PM  Readmission Risk Prevention Plan  Transportation Screening Complete Complete  PCP or Specialist Appt within 3-5 Days Complete Complete  HRI or Home Care Consult Complete Complete  Social Work Consult for Recovery Care Planning/Counseling Complete Complete  Palliative Care Screening Complete Complete  Medication Review Oceanographer) Complete Complete

## 2023-01-24 DIAGNOSIS — A419 Sepsis, unspecified organism: Secondary | ICD-10-CM | POA: Diagnosis not present

## 2023-01-24 DIAGNOSIS — R6520 Severe sepsis without septic shock: Secondary | ICD-10-CM | POA: Diagnosis not present

## 2023-01-24 DIAGNOSIS — E872 Acidosis, unspecified: Secondary | ICD-10-CM | POA: Diagnosis not present

## 2023-01-24 DIAGNOSIS — N1832 Chronic kidney disease, stage 3b: Secondary | ICD-10-CM | POA: Diagnosis not present

## 2023-01-24 DIAGNOSIS — K8000 Calculus of gallbladder with acute cholecystitis without obstruction: Secondary | ICD-10-CM | POA: Diagnosis not present

## 2023-01-24 LAB — BASIC METABOLIC PANEL
Anion gap: 5 (ref 5–15)
BUN: 18 mg/dL (ref 8–23)
CO2: 27 mmol/L (ref 22–32)
Calcium: 8.2 mg/dL — ABNORMAL LOW (ref 8.9–10.3)
Chloride: 109 mmol/L (ref 98–111)
Creatinine, Ser: 1.4 mg/dL — ABNORMAL HIGH (ref 0.61–1.24)
GFR, Estimated: 47 mL/min — ABNORMAL LOW (ref >60)
Glucose, Bld: 92 mg/dL (ref 70–99)
Potassium: 3.7 mmol/L (ref 3.5–5.1)
Sodium: 141 mmol/L (ref 135–145)

## 2023-01-24 LAB — GLUCOSE, CAPILLARY
Glucose-Capillary: 108 mg/dL — ABNORMAL HIGH (ref 70–99)
Glucose-Capillary: 116 mg/dL — ABNORMAL HIGH (ref 70–99)
Glucose-Capillary: 125 mg/dL — ABNORMAL HIGH (ref 70–99)
Glucose-Capillary: 163 mg/dL — ABNORMAL HIGH (ref 70–99)
Glucose-Capillary: 90 mg/dL (ref 70–99)
Glucose-Capillary: 90 mg/dL (ref 70–99)
Glucose-Capillary: 92 mg/dL (ref 70–99)

## 2023-01-24 NOTE — Consult Note (Signed)
[facility]       Reason for Consult:abdominal infection    Referring Physician: Dr. Caleb PoppNettey  Principal Problem:   Severe sepsis with lactic acidosis Active Problems:   Essential hypertension   Chronic kidney disease, stage 3b   Rheumatoid arthritis involving multiple sites   Alzheimer's dementia with agitation   Lab test positive for detection of COVID-19 virus   Acute calculous cholecystitis   Hyperglycemia   Coronary artery disease   Hypothyroidism    insulin aspart  0-6 Units Subcutaneous Q4H   levothyroxine  25 mcg Oral Q0600   memantine  10 mg Oral BID   metoprolol succinate  12.5 mg Oral Daily   mouth rinse  15 mL Mouth Rinse 4 times per day   senna-docusate  1 tablet Oral BID   sodium chloride flush  3 mL Intravenous Q12H   sodium chloride flush  5 mL Intracatheter Q8H    Recommendations: Augmentin x 2 weeks from discharge  Assessment: Cholecystitis with tube placement.  Drain culture with no growth.  I would treat broadly for another 2 weeks with Augmentin.  Can stop after that, even if drain remains in place.   HPI: Karie KirksRalph S Nicodemus is a 89 y.o. male with a history of dementia, chronic kidney disease, COPD came in due to abdominal pain and found to have acute cholecystitis.  No surgery performed due to coomorbidities.  IR placed a percutaneous tube in on 4/3 and he continued on piperacillin/tazobactam for 10 days.  Leukocytosis resolved.  No fever.  Care provider at bedside.   Review of Systems:  Constitutional: negative for fevers and chills All other systems reviewed and are negative    Past Medical History:  Diagnosis Date   Alzheimer disease 07/31/2018   Angina pectoris    Arthritis    Cardiomyopathy    COPD (chronic obstructive pulmonary disease)    Gastroesophageal reflux disease    History of colon polyps    Hyperlipidemia    Hypertension    Obesity    Osteoporosis    Polymyalgia rheumatica    Prostate cancer     Rheumatoid arthritis    Secondary Parkinson disease 08/13/2019   Sleep apnea     Social History   Tobacco Use   Smoking status: Never   Smokeless tobacco: Never  Substance Use Topics   Alcohol use: Not Currently   Drug use: Not Currently    Family History  Problem Relation Age of Onset   Stroke Mother    Dementia Mother    Cancer Father    Dementia Sister    Dementia Maternal Aunt     Allergies  Allergen Reactions   Lorazepam Other (See Comments)    Generalized weakness    Physical Exam: Constitutional: in no apparent distress  Vitals:   01/24/23 0532 01/24/23 1333  BP: 131/71 (!) 119/59  Pulse: 71 95  Resp: 20 20  Temp: 98 F (36.7 C) 98.1 F (36.7 C)  SpO2: 95% 96%   EYES: anicteric Respiratory: normal respiratory effort  Lab Results  Component Value Date   WBC 8.3 01/23/2023   HGB 10.9 (L) 01/23/2023   HCT 34.3 (L) 01/23/2023   MCV 93.7 01/23/2023   PLT 171 01/23/2023    Lab Results  Component Value Date   CREATININE 1.40 (H) 01/24/2023   BUN 18 01/24/2023   NA 141 01/24/2023   K 3.7 01/24/2023   CL 109 01/24/2023   CO2 27 01/24/2023  Lab Results  Component Value Date   ALT 95 (H) 01/23/2023   AST 47 (H) 01/23/2023   ALKPHOS 84 01/23/2023     Microbiology: Recent Results (from the past 240 hour(s))  Blood Culture (routine x 2)     Status: None   Collection Time: 01/15/23  9:55 PM   Specimen: BLOOD  Result Value Ref Range Status   Specimen Description   Final    BLOOD BLOOD RIGHT FOREARM Performed at [facility], [address], [place], [place] [address]    Special Requests   Final    BOTTLES DRAWN AEROBIC AND ANAEROBIC Blood Culture adequate volume Performed at [facility], [address], [place], [place] [address]    Culture   Final    NO GROWTH 5 DAYS Performed at [facility], [address] [place], [place] [address]    Report Status 01/20/2023 FINAL  Final  Blood Culture  (routine x 2)     Status: None   Collection Time: 01/15/23 10:00 PM   Specimen: BLOOD  Result Value Ref Range Status   Specimen Description   Final    BLOOD RIGHT ANTECUBITAL Performed at [facility], [address], [place], [place] [address]    Special Requests   Final    BOTTLES DRAWN AEROBIC AND ANAEROBIC Blood Culture adequate volume Performed at [facility], [address] [place], [place] [address]    Culture   Final    NO GROWTH 5 DAYS Performed at [facility], [address] [place], [place] [address]    Report Status 01/20/2023 FINAL  Final  Resp panel by RT-PCR (RSV, Flu A&B, Covid) Anterior Nasal Swab     Status: Abnormal   Collection Time: 01/15/23 10:17 PM   Specimen: Anterior Nasal Swab  Result Value Ref Range Status   SARS Coronavirus 2 by RT PCR POSITIVE (A) NEGATIVE Final    Comment: (NOTE) SARS-CoV-2 target nucleic acids are DETECTED.  The SARS-CoV-2 RNA is generally detectable in upper respiratory specimens during the acute phase of infection. Positive results are indicative of the presence of the identified virus, but do not rule out bacterial infection or co-infection with other pathogens not detected by the test. Clinical correlation with patient history and other diagnostic information is necessary to determine patient infection status. The expected result is Negative.  Fact Sheet for Patients: BloggerCourse.com  Fact Sheet for Healthcare Providers: SeriousBroker.it  This test is not yet approved or cleared by the [place] FDA and  has been authorized for detection and/or diagnosis of SARS-CoV-2 by FDA under an Emergency Use Authorization (EUA).  This EUA will remain in effect (meaning this test can be used) for the duration of  the COVID-19 declaration under Section 564(b)(1) of the A ct, 21 U.S.C. section 360bbb-3(b)(1), unless the authorization  is terminated or revoked sooner.     Influenza A by PCR NEGATIVE NEGATIVE Final   Influenza B by PCR NEGATIVE NEGATIVE Final    Comment: (NOTE) The Xpert Xpress SARS-CoV-2/FLU/RSV plus assay is intended as an aid in the diagnosis of influenza from Nasopharyngeal swab specimens and should not be used as a sole basis for treatment. Nasal washings and aspirates are unacceptable for Xpert Xpress SARS-CoV-2/FLU/RSV testing.  Fact Sheet for Patients: BloggerCourse.com  Fact Sheet for Healthcare Providers: SeriousBroker.it  This test is not yet approved or cleared by the [place] FDA and has been authorized for detection and/or  diagnosis of SARS-CoV-2 by FDA under an Emergency Use Authorization (EUA). This EUA will remain in effect (meaning this test can be used) for the duration of the COVID-19 declaration under Section 564(b)(1) of the Act, 21 U.S.C. section 360bbb-3(b)(1), unless the authorization is terminated or revoked.     Resp Syncytial Virus by PCR NEGATIVE NEGATIVE Final    Comment: (NOTE) Fact Sheet for Patients: BloggerCourse.com  Fact Sheet for Healthcare Providers: SeriousBroker.it  This test is not yet approved or cleared by the [place] FDA and has been authorized for detection and/or diagnosis of SARS-CoV-2 by FDA under an Emergency Use Authorization (EUA). This EUA will remain in effect (meaning this test can be used) for the duration of the COVID-19 declaration under Section 564(b)(1) of the Act, 21 U.S.C. section 360bbb-3(b)(1), unless the authorization is terminated or revoked.  Performed at [facility], [address] [place], [place] [address]   Aerobic/Anaerobic Culture w Gram Stain (surgical/deep wound)     Status: None   Collection Time: 01/16/23  3:12 PM   Specimen: BILE  Result Value Ref Range Status   Specimen  Description   Final    BILE Performed at [facility], [address], [place], [place] [address]    Special Requests   Final    NONE Performed at [facility], [address], [place], [place] [address]    Gram Stain NO WBC SEEN NO ORGANISMS SEEN   Final   Culture   Final    No growth aerobically or anaerobically. Performed at [facility], [address] [place], [place] [address]    Report Status 01/21/2023 FINAL  Final    Gardiner Barefoot, MD [facility] [email or web address] 01/24/2023, 3:54 PM

## 2023-01-24 NOTE — Plan of Care (Signed)
  Problem: Respiratory: Goal: Will maintain a patent airway Outcome: Progressing   Problem: Clinical Measurements: Goal: Diagnostic test results will improve Outcome: Progressing   Problem: Skin Integrity: Goal: Risk for impaired skin integrity will decrease Outcome: Progressing   Problem: Education: Goal: Knowledge of risk factors and measures for prevention of condition will improve Outcome: Not Progressing   Problem: Health Behavior/Discharge Planning: Goal: Ability to identify and utilize available resources and services will improve Outcome: Not Progressing   Problem: Nutritional: Goal: Maintenance of adequate nutrition will improve Outcome: Not Progressing

## 2023-01-24 NOTE — TOC Progression Note (Addendum)
Transition of Care [facility]) - Progression Note    Patient Details  Name: Devin Vance MRN: [id number] Date of Birth: 1928-01-03  [facility] of [facility] CM/SW Contact  Goran Olden, Olegario Messier, RN Phone Number: 01/24/2023, 12:12 PM  Clinical Narrative:  Bed offers given to Susan(dtr) await choice, & pasrr.  -2:57p Susan(dtr) preferred to contact facilities not accepting since willing to self pay-TOC called-[facility]-left vm;[facility]-Full;[facility]-not accepting outside patients.Pasrr added to fl2.    Expected Discharge Plan: Skilled Nursing Facility Barriers to Discharge: Continued Medical Work up  Expected Discharge Plan and Services   Discharge Planning Services: CM Consult   Living arrangements for the past 2 months: Independent Living Facility ([facility])                                       Social Determinants of Health (SDOH) Interventions SDOH Screenings   Food Insecurity: No Food Insecurity (01/16/2023)  Housing: Low Risk  (01/19/2023)  Transportation Needs: No Transportation Needs (01/19/2023)  Utilities: Not At Risk (01/19/2023)  Tobacco Use: Low Risk  (01/19/2023)    Readmission Risk Interventions    01/22/2023   11:25 AM 01/16/2023    2:27 PM  Readmission Risk Prevention Plan  Transportation Screening Complete Complete  PCP or Specialist Appt within 3-5 Days Complete Complete  HRI or Home Care Consult Complete Complete  Social Work Consult for Recovery Care Planning/Counseling Complete Complete  Palliative Care Screening Complete Complete  Medication Review Oceanographer) Complete Complete

## 2023-01-24 NOTE — Progress Notes (Signed)
Physical Therapy Treatment Patient Details Name: Devin Vance MRN: [id number] DOB: 1927-10-30 Today's Date: 01/24/2023   History of Present Illness Pt is 89 yo male admitted on 01/15/23 with severe sepsis due to acute calculus cholecystitis.  Pt s/p IR guided percutaneous cholecystostomy tube on 01/16/23.  Pt with hx including recent hospitalization for COVID, Alzheimer's dementia, CAD, CKD stage IIIb, COPD, rheumatoid arthritis, HTN, HLD, prostate cancer, OSA not using CPAP    PT Comments    Pt assisted with sitting EOB and working on posture and sitting balance today.  Pt continues to require significant assist for bed mobility and external support for trunk control most of the time.  Pt's caregiver present and assisted with cues and encouragement.    Recommendations for follow up therapy are one component of a multi-disciplinary discharge planning process, led by the attending physician.  Recommendations may be updated based on patient status, additional functional criteria and insurance authorization.  Follow Up Recommendations  Can patient physically be transported by private vehicle: No    Assistance Recommended at Discharge Frequent or constant Supervision/Assistance  Patient can return home with the following Two people to help with walking and/or transfers;Two people to help with bathing/dressing/bathroom;Assist for transportation;Assistance with cooking/housework;Direct supervision/assist for medications management;Direct supervision/assist for financial management;Assistance with feeding   Equipment Recommendations  Wheelchair (measurements PT);Wheelchair cushion (measurements PT)    Recommendations for Other Services       Precautions / Restrictions       Mobility  Bed Mobility Overal bed mobility: Needs Assistance Bed Mobility: Supine to Sit, Sit to Supine     Supine to sit: Total assist, +2 for physical assistance Sit to supine: Total assist, +2 for physical  assistance   General bed mobility comments: not resistive to movement, attempted to assist with moving LEs over EOB however mostly total assist    Transfers                        Ambulation/Gait                   Stairs             Wheelchair Mobility    Modified Rankin (Stroke Patients Only)       Balance Overall balance assessment: Needs assistance Sitting-balance support: Bilateral upper extremity supported, Feet supported Sitting balance-Leahy Scale: Poor Sitting balance - Comments: requiring external assist, briefly able to hold for approx 10-15 seconds with bil UE support, attempted to engage pt with sitting upright posture and hold, pt fatigues quickly Postural control: Posterior lean                                  Cognition Arousal/Alertness: Awake/alert Behavior During Therapy: Flat affect Overall Cognitive Status: History of cognitive impairments - at baseline Area of Impairment: Following commands, Safety/judgement, Problem solving                       Following Commands: Follows one step commands inconsistently, Follows one step commands with increased time Safety/Judgement: Decreased awareness of safety, Decreased awareness of deficits   Problem Solving: Slow processing, Decreased initiation, Difficulty sequencing, Requires verbal cues, Requires tactile cues          Exercises      General Comments        Pertinent Vitals/Pain Pain Assessment Pain Assessment: PAINAD Faces Pain Scale:  Hurts even more Pain Location: says yes to right hand, caregiver reports tenderness due to RA Pain Intervention(s): Repositioned, Monitored during session    Home Living                          Prior Function            PT Goals (current goals can now be found in the care plan section) Progress towards PT goals: Progressing toward goals    Frequency    Min 2X/week      PT Plan Current  plan remains appropriate    Co-evaluation              AM-PAC PT "6 Clicks" Mobility   Outcome Measure  Help needed turning from your back to your side while in a flat bed without using bedrails?: Total Help needed moving from lying on your back to sitting on the side of a flat bed without using bedrails?: Total Help needed moving to and from a bed to a chair (including a wheelchair)?: Total Help needed standing up from a chair using your arms (e.g., wheelchair or bedside chair)?: Total Help needed to walk in hospital room?: Total Help needed climbing 3-5 steps with a railing? : Total 6 Click Score: 6    End of Session   Activity Tolerance: Patient limited by fatigue Patient left: in bed;with call bell/phone within reach;with bed alarm set;with family/visitor present Nurse Communication: Mobility status PT Visit Diagnosis: Muscle weakness (generalized) (M62.81)     Time: 3300-7622 PT Time Calculation (min) (ACUTE ONLY): 17 min  Charges:  $Therapeutic Activity: 8-22 mins                     Paulino Door, DPT Physical Therapist Acute Rehabilitation Services Office: [phone]   Janan Halter [place] 01/24/2023, 1:35 PM

## 2023-01-24 NOTE — Progress Notes (Signed)
SLP Cancellation Note  Patient Details Name: Devin Vance MRN: [id number] DOB: [birth year]   Cancelled treatment:       Reason Eval/Treat Not Completed: Other (comment);Fatigue/lethargy limiting ability to participate   Patient currently lethargic. Caregiver, Devin Vance, present and reports patient consumed a good breakfast. She advised that he was not ready for lunch yet and  she will try order when he awakens adequately.   SLP did examine anterior oral cavity and patient appears with minimal white coating that may be consistent with oral candidiasis.  Advised RN and MD via secure chat.    Patient's daughter was not present at this time and SLP advised caregiver that we will try to follow-up later on today for advanced p.o. trials.  When patient did vocalize today, his voice did appear stronger but he was not adequately alert for p.o.  Devin Infante, MS [facility] SLP Acute Rehab Services Office [phone]   Devin Vance 01/24/2023, 1:40 PM

## 2023-01-24 NOTE — Progress Notes (Signed)
PROGRESS NOTE    KIRKLIN BARNO  MWU:[id number] DOB: January 15, 1928 DOA: 01/15/2023 PCP: Georgann Housekeeper, MD   Brief Narrative: Devin Vance is a 89 y.o. male with a history of Alzheimer's dementia, CAD, CKD stage IIIb, COPD, rheumatoid arthritis, hypertension, hyperlipidemia, prostate cancer, OSA not on CPAP.  Patient presented secondary to abdominal pain and nausea and was found to have evidence of acute cholecystitis with associated severe sepsis.  Patient was started on empiric antibiotics.  General surgery consulted and recommended no surgical management secondary to comorbidities.  Interventional radiology consulted and performed percutaneous cholecystomy tube placement with improvement in symptoms.  Culture data without identification of an organism.  Hospitalization complicated by decreased oral intake which complicated by underlying dementia.   Assessment and Plan:  Severe sepsis Present on admission and secondary to acute cholecystitis. Empiric Cefepime and Flagyl started with transition to Zosyn IV. Blood cultures obtained and are no growth. Sepsis physiology improved with treatment of acute cholecystitis.  Acute calculus cholecystitis Present on admission with associated severe sepsis. Empiric Zosyn started. General surgery consulted. Per general surgery patient is not a surgical candidate. IR consulted and placed a percutaneous cholecystostomy tube on 4/3. Drain culture without an identified organism. -Continue Zosyn IV -Discussed with ID regarding outpatient antibiotics regimen. Will await recommendations today.  Leukocytosis WBC of 41,000 on admission secondary to severe sepsis/acute cholecystitis. Resolved with treatment of infection.  CKD stage IIIb Stable.  Hypokalemia Recurrent. Resolved with repletion.  COVID-19 positive test Patient initially tested positive on 3/20 and remained positive on test two weeks after initial positive test. Patient not treated for COVID-19  infection.  CAD No chest pain.  Thrombocytopenia Secondary to acute illness. Resolved.  Primary hypertension -Continue metoprolol succinate  Alzheimer's dementia -Continue Namenda  Dysphagia Patient evaluated by speech therapy. -Speech therapy recommendations (4/9): Diet recommendations: Dysphagia 1 (puree);Thin liquid;Other(comment) (Extra gravy and sauce) Liquids provided via: Cup;Straw Medication Administration: Crushed with puree Supervision: Trained caregiver to feed patient;Full supervision/cueing for compensatory strategies Compensations: Slow rate;Small sips/bites;Minimize environmental distractions Postural Changes and/or Swallow Maneuvers: Seated upright 90 degrees  Rheumatoid arthritis Patient is on Plaquenil as an outpatient which is on hold secondary to acute infection.  Hypothyroidism -Continue Synthroid 25 mcg daily  OSA Not using CPAP   DVT prophylaxis: SCDs Code Status:   Code Status: DNR Family Communication: Daughter and caregiver at bedside Disposition Plan: Discharge to SNF when bed is available. Medically stable for discharge today pending final ID antibiotic recommendations.   Consultants:  General surgery Interventional radiology Palliative care medicine Infectious disease  Procedures:  4/3: [place] and fluoro guided perc cholecystostomy catheter placement  Antimicrobials: Cefepime Flagyl Zosyn IV    Subjective: Patient reports no abdominal pain today. Daughter at bedside is happy to see her father is more alert and interactive today.  Objective: BP 131/71 (BP Location: Left Arm)   Pulse 71   Temp 98 F (36.7 C) (Oral)   Resp 20   Ht 5\' 10"  (1.778 m)   Wt 88.6 kg   SpO2 95%   BMI 28.03 kg/m   Examination:  General exam: Appears calm and comfortable Respiratory system: Clear to auscultation. Respiratory effort normal. Cardiovascular system: S1 & S2 heard, RRR. No murmurs, rubs, gallops or clicks. Gastrointestinal system:  Abdomen is nondistended, soft and nontender. Normal bowel sounds heard. Central nervous system: Alert. Musculoskeletal: No edema. No calf tenderness   Data Reviewed: I have personally reviewed following labs and imaging studies  CBC Lab Results  Component  Value Date   WBC 8.3 01/23/2023   RBC 3.66 (L) 01/23/2023   HGB 10.9 (L) 01/23/2023   HCT 34.3 (L) 01/23/2023   MCV 93.7 01/23/2023   MCH 29.8 01/23/2023   PLT 171 01/23/2023   MCHC 31.8 01/23/2023   RDW 13.4 01/23/2023   LYMPHSABS 1.0 01/23/2023   MONOABS 0.3 01/23/2023   EOSABS 0.4 01/23/2023   BASOSABS 0.0 01/23/2023     Last metabolic panel Lab Results  Component Value Date   NA 141 01/24/2023   K 3.7 01/24/2023   CL 109 01/24/2023   CO2 27 01/24/2023   BUN 18 01/24/2023   CREATININE 1.40 (H) 01/24/2023   GLUCOSE 92 01/24/2023   GFRNONAA 47 (L) 01/24/2023   GFRAA 47 (L) 07/15/2019   CALCIUM 8.2 (L) 01/24/2023   PROT 5.2 (L) 01/23/2023   ALBUMIN 2.3 (L) 01/23/2023   LABGLOB 1.9 07/15/2019   AGRATIO 2.1 07/15/2019   BILITOT 0.8 01/23/2023   ALKPHOS 84 01/23/2023   AST 47 (H) 01/23/2023   ALT 95 (H) 01/23/2023   ANIONGAP 5 01/24/2023    GFR: Estimated Creatinine Clearance: 36.9 mL/min (A) (by C-G formula based on SCr of 1.4 mg/dL (H)).  Recent Results (from the past 240 hour(s))  Blood Culture (routine x 2)     Status: None   Collection Time: 01/15/23  9:55 PM   Specimen: BLOOD  Result Value Ref Range Status   Specimen Description   Final    BLOOD BLOOD RIGHT FOREARM Performed at [facility], [address], [place], [place] [address]    Special Requests   Final    BOTTLES DRAWN AEROBIC AND ANAEROBIC Blood Culture adequate volume Performed at [facility], [address], [place], [place] [address]    Culture   Final    NO GROWTH 5 DAYS Performed at [facility], [address], [place], [place] [address]    Report Status 01/20/2023 FINAL  Final   Blood Culture (routine x 2)     Status: None   Collection Time: 01/15/23 10:00 PM   Specimen: BLOOD  Result Value Ref Range Status   Specimen Description   Final    BLOOD RIGHT ANTECUBITAL Performed at [facility], [address] [place], [place] [address]    Special Requests   Final    BOTTLES DRAWN AEROBIC AND ANAEROBIC Blood Culture adequate volume Performed at [facility], [address] [place], [place] [address]    Culture   Final    NO GROWTH 5 DAYS Performed at [facility], [address] [place], [place] [address]    Report Status 01/20/2023 FINAL  Final  Resp panel by RT-PCR (RSV, Flu A&B, Covid) Anterior Nasal Swab     Status: Abnormal   Collection Time: 01/15/23 10:17 PM   Specimen: Anterior Nasal Swab  Result Value Ref Range Status   SARS Coronavirus 2 by RT PCR POSITIVE (A) NEGATIVE Final    Comment: (NOTE) SARS-CoV-2 target nucleic acids are DETECTED.  The SARS-CoV-2 RNA is generally detectable in upper respiratory specimens during the acute phase of infection. Positive results are indicative of the presence of the identified virus, but do not rule out bacterial infection or co-infection with other pathogens not detected by the test. Clinical correlation with patient history and other diagnostic information is necessary to determine patient infection status. The expected result is Negative.  Fact Sheet for Patients: BloggerCourse.com  Fact Sheet  for Healthcare Providers: SeriousBroker.ithttps://www.fda.gov/media/152162/download  This test is not yet approved or cleared by the [place] FDA and  has been authorized for detection and/or diagnosis of SARS-CoV-2 by FDA under an Emergency Use Authorization (EUA).  This EUA will remain in effect (meaning this test can be used) for the duration of  the COVID-19 declaration under Section 564(b)(1) of the A ct, 21 U.S.C. section 360bbb-3(b)(1), unless  the authorization is terminated or revoked sooner.     Influenza A by PCR NEGATIVE NEGATIVE Final   Influenza B by PCR NEGATIVE NEGATIVE Final    Comment: (NOTE) The Xpert Xpress SARS-CoV-2/FLU/RSV plus assay is intended as an aid in the diagnosis of influenza from Nasopharyngeal swab specimens and should not be used as a sole basis for treatment. Nasal washings and aspirates are unacceptable for Xpert Xpress SARS-CoV-2/FLU/RSV testing.  Fact Sheet for Patients: BloggerCourse.comhttps://www.fda.gov/media/152166/download  Fact Sheet for Healthcare Providers: SeriousBroker.ithttps://www.fda.gov/media/152162/download  This test is not yet approved or cleared by the [place] FDA and has been authorized for detection and/or diagnosis of SARS-CoV-2 by FDA under an Emergency Use Authorization (EUA). This EUA will remain in effect (meaning this test can be used) for the duration of the COVID-19 declaration under Section 564(b)(1) of the Act, 21 U.S.C. section 360bbb-3(b)(1), unless the authorization is terminated or revoked.     Resp Syncytial Virus by PCR NEGATIVE NEGATIVE Final    Comment: (NOTE) Fact Sheet for Patients: BloggerCourse.comhttps://www.fda.gov/media/152166/download  Fact Sheet for Healthcare Providers: SeriousBroker.ithttps://www.fda.gov/media/152162/download  This test is not yet approved or cleared by the [place] FDA and has been authorized for detection and/or diagnosis of SARS-CoV-2 by FDA under an Emergency Use Authorization (EUA). This EUA will remain in effect (meaning this test can be used) for the duration of the COVID-19 declaration under Section 564(b)(1) of the Act, 21 U.S.C. section 360bbb-3(b)(1), unless the authorization is terminated or revoked.  Performed at [facility], [address] [place], [place] [address]   Aerobic/Anaerobic Culture w Gram Stain (surgical/deep wound)     Status: None   Collection Time: 01/16/23  3:12 PM   Specimen: BILE  Result Value Ref Range Status    Specimen Description   Final    BILE Performed at [facility], [address] [place], [place] [address]    Special Requests   Final    NONE Performed at [facility], [address] [place], [place] [address]    Gram Stain NO WBC SEEN NO ORGANISMS SEEN   Final   Culture   Final    No growth aerobically or anaerobically. Performed at [facility], [address] [place], [place] [address]    Report Status 01/21/2023 FINAL  Final      Radiology Studies: No results found.    LOS: 9 days    Jacquelin Hawkingalph Ruth Tully, MD [facility] 01/24/2023, 11:34 AM   If 7PM-7AM, please contact night-coverage [email or web address]

## 2023-01-25 DIAGNOSIS — R6520 Severe sepsis without septic shock: Secondary | ICD-10-CM | POA: Diagnosis not present

## 2023-01-25 DIAGNOSIS — K8000 Calculus of gallbladder with acute cholecystitis without obstruction: Secondary | ICD-10-CM | POA: Diagnosis not present

## 2023-01-25 DIAGNOSIS — A419 Sepsis, unspecified organism: Secondary | ICD-10-CM | POA: Diagnosis not present

## 2023-01-25 DIAGNOSIS — N1832 Chronic kidney disease, stage 3b: Secondary | ICD-10-CM | POA: Diagnosis not present

## 2023-01-25 LAB — GLUCOSE, CAPILLARY
Glucose-Capillary: 121 mg/dL — ABNORMAL HIGH (ref 70–99)
Glucose-Capillary: 135 mg/dL — ABNORMAL HIGH (ref 70–99)
Glucose-Capillary: 142 mg/dL — ABNORMAL HIGH (ref 70–99)
Glucose-Capillary: 90 mg/dL (ref 70–99)
Glucose-Capillary: 94 mg/dL (ref 70–99)
Glucose-Capillary: 97 mg/dL (ref 70–99)

## 2023-01-25 MED ORDER — AMOXICILLIN-POT CLAVULANATE 875-125 MG PO TABS
1.0000 | ORAL_TABLET | Freq: Two times a day (BID) | ORAL | Status: DC
  Administered 2023-01-25 – 2023-01-29 (×7): 1 via ORAL
  Filled 2023-01-25 (×9): qty 1

## 2023-01-25 NOTE — Care Management Important Message (Signed)
Important Message  Patient Details IM Letter given. Name: SHINJI MEEKER MRN: [id number] Date of Birth: 18-Mar-1928   Medicare Important Message Given:  Yes     Caren Macadam 01/25/2023, 12:30 PM

## 2023-01-25 NOTE — Progress Notes (Signed)
Patient ID: Devin Vance, male   DOB: 04/13/1928, 89 y.o.   MRN: [id number] Pt's GB drain intact, functioning appropriately , output 190 cc last 12 hrs, drain flushes without difficulty; f/u has been scheduled; at dc rec once daily flush of drain with 5 cc sterile saline, daily output recording and dressing changes every 2-3 days; call [phone] or [phone] with any drain related questions

## 2023-01-25 NOTE — Progress Notes (Signed)
Mobility Specialist - Progress Note   01/25/23 1110  Mobility  Activity  (Supine LE Exercises)  Range of Motion/Exercises Right leg;Left leg;Passive  Activity Response Tolerated well  Mobility Referral Yes  $Mobility charge 1 Mobility   Pt received in bed and agreeable to do Supine LE exercises. Exercises noted below. Pt required passive help w/ exercises secondary to weakness. No complaints during session. Pt to bed after session w/ all needs met & daughter in room.   Supine BLE exercises: 5 reps each  1) Ankle Pumps (Passive)  2) Heel Slides (Passive)  3) Hip Abduction /Adduction (Passive)     Chief Technology Officer

## 2023-01-25 NOTE — TOC Progression Note (Addendum)
Transition of Care [facility]) - Progression Note    Patient Details  Name: JAYDENN MALINA MRN: [id number] Date of Birth: [birth year]  Transition of Care [facility]) CM/SW Contact  Esteen Delpriore, Olegario Messier, RN Phone Number: 01/25/2023, 12:21 PM  Clinical Narrative: [facility] chosen by susan(dtr) rep [facility] has bed available Monday.MD updated.  -12:44p Received  call from Susan(dtr) requesting to send info to [facility]TOC informed [facility] rep that info was sent initially & they declined-facility full-now asked to resend per [facility] for review-no bed available-await review outcome.  -12:53p-[facility] rep [facility]-accepted-bed avaialbe Monday. Susan(dtr) aware of all choices-await final choice.    Expected Discharge Plan: Skilled Nursing Facility Barriers to Discharge: Other (must enter comment) (SNF bed available Monday 01/28/23)  Expected Discharge Plan and Services   Discharge Planning Services: CM Consult   Living arrangements for the past 2 months: Independent Living Facility ([facility])                                       Social Determinants of Health (SDOH) Interventions SDOH Screenings   Food Insecurity: No Food Insecurity (01/16/2023)  Housing: Low Risk  (01/19/2023)  Transportation Needs: No Transportation Needs (01/19/2023)  Utilities: Not At Risk (01/19/2023)  Tobacco Use: Low Risk  (01/19/2023)    Readmission Risk Interventions    01/22/2023   11:25 AM 01/16/2023    2:27 PM  Readmission Risk Prevention Plan  Transportation Screening Complete Complete  PCP or Specialist Appt within 3-5 Days Complete Complete  HRI or Home Care Consult Complete Complete  Social Work Consult for Recovery Care Planning/Counseling Complete Complete  Palliative Care Screening Complete Complete  Medication Review Oceanographer) Complete Complete

## 2023-01-25 NOTE — Progress Notes (Signed)
PROGRESS NOTE    Devin Vance  RUE:[id number] DOB: [birth year] DOA: 01/15/2023 PCP: Georgann Housekeeper, MD   Brief Narrative: Devin Vance is a 89 y.o. male with a history of Alzheimer's dementia, CAD, CKD stage IIIb, COPD, rheumatoid arthritis, hypertension, hyperlipidemia, prostate cancer, OSA not on CPAP.  Patient presented secondary to abdominal pain and nausea and was found to have evidence of acute cholecystitis with associated severe sepsis.  Patient was started on empiric antibiotics.  General surgery consulted and recommended no surgical management secondary to comorbidities.  Interventional radiology consulted and performed percutaneous cholecystomy tube placement with improvement in symptoms.  Culture data without identification of an organism.  Hospitalization complicated by decreased oral intake which complicated by underlying dementia.   Assessment and Plan:  Severe sepsis Present on admission and secondary to acute cholecystitis. Empiric Cefepime and Flagyl started with transition to Zosyn IV. Blood cultures obtained and are no growth. Sepsis physiology improved with treatment of acute cholecystitis.  Acute calculus cholecystitis Present on admission with associated severe sepsis. Empiric Zosyn started. General surgery consulted. Per general surgery patient is not a surgical candidate. IR consulted and placed a percutaneous cholecystostomy tube on 4/3. Drain culture without an identified organism. ID consulted and recommend transition to Augmentin for 14 days. -Discontinue Zosyn IV and start Augmentin  Leukocytosis WBC of 41,000 on admission secondary to severe sepsis/acute cholecystitis. Resolved with treatment of infection.  CKD stage IIIb Stable.  Hypokalemia Recurrent. Resolved with repletion.  COVID-19 positive test Patient initially tested positive on 3/20 and remained positive on test two weeks after initial positive test. Patient not treated for COVID-19  infection.  CAD No chest pain.  Thrombocytopenia Secondary to acute illness. Resolved.  Primary hypertension -Continue metoprolol succinate  Alzheimer's dementia -Continue Namenda  Dysphagia Patient evaluated by speech therapy. -Speech therapy recommendations (4/9): Diet recommendations: Dysphagia 1 (puree);Thin liquid;Other(comment) (Extra gravy and sauce) Liquids provided via: Cup;Straw Medication Administration: Crushed with puree Supervision: Trained caregiver to feed patient;Full supervision/cueing for compensatory strategies Compensations: Slow rate;Small sips/bites;Minimize environmental distractions Postural Changes and/or Swallow Maneuvers: Seated upright 90 degrees  Rheumatoid arthritis Patient is on Plaquenil as an outpatient which is on hold secondary to acute infection.  Hypothyroidism -Continue Synthroid 25 mcg daily  OSA Not using CPAP   DVT prophylaxis: SCDs Code Status:   Code Status: DNR Family Communication: Daughter at bedside Disposition Plan: Discharge to SNF when bed is available. Medically stable for discharge today pending final ID antibiotic recommendations.   Consultants:  General surgery Interventional radiology Palliative care medicine Infectious disease  Procedures:  4/3: [place] and fluoro guided perc cholecystostomy catheter placement  Antimicrobials: Cefepime Flagyl Zosyn IV Augmentin   Subjective: No concerns this morning. Ate his entire breakfast.  Objective: BP 120/60   Pulse 70   Temp 98.6 F (37 C) (Axillary)   Resp 20   Ht  (1.778 m)   Wt 87.6 kg   SpO2 95%   BMI 27.71 kg/m   Examination:  General exam: Appears calm and comfortable Respiratory system: Clear to auscultation. Respiratory effort normal. Cardiovascular system: S1 & S2 heard, RRR. Gastrointestinal system: Abdomen is nondistended, soft and nontender. Normal bowel sounds heard. Central nervous system: Alert. Musculoskeletal: No edema. No  calf tenderness Skin: No cyanosis. No rashes   Data Reviewed: I have personally reviewed following labs and imaging studies  CBC Lab Results  Component Value Date   WBC 8.3 01/23/2023   RBC 3.66 (L) 01/23/2023   HGB 10.9 (L)  01/23/2023   HCT 34.3 (L) 01/23/2023   MCV 93.7 01/23/2023   MCH 29.8 01/23/2023   PLT 171 01/23/2023   MCHC 31.8 01/23/2023   RDW 13.4 01/23/2023   LYMPHSABS 1.0 01/23/2023   MONOABS 0.3 01/23/2023   EOSABS 0.4 01/23/2023   BASOSABS 0.0 01/23/2023     Last metabolic panel Lab Results  Component Value Date   NA 141 01/24/2023   K 3.7 01/24/2023   CL 109 01/24/2023   CO2 27 01/24/2023   BUN 18 01/24/2023   CREATININE 1.40 (H) 01/24/2023   GLUCOSE 92 01/24/2023   GFRNONAA 47 (L) 01/24/2023   GFRAA 47 (L) 07/15/2019   CALCIUM 8.2 (L) 01/24/2023   PROT 5.2 (L) 01/23/2023   ALBUMIN 2.3 (L) 01/23/2023   LABGLOB 1.9 07/15/2019   AGRATIO 2.1 07/15/2019   BILITOT 0.8 01/23/2023   ALKPHOS 84 01/23/2023   AST 47 (H) 01/23/2023   ALT 95 (H) 01/23/2023   ANIONGAP 5 01/24/2023    GFR: Estimated Creatinine Clearance: 36.7 mL/min (A) (by C-G formula based on SCr of 1.4 mg/dL (H)).  Recent Results (from the past 240 hour(s))  Blood Culture (routine x 2)     Status: None   Collection Time: 01/15/23  9:55 PM   Specimen: BLOOD  Result Value Ref Range Status   Specimen Description   Final    BLOOD BLOOD RIGHT FOREARM Performed at [facility], [address] [place], [place] [address]    Special Requests   Final    BOTTLES DRAWN AEROBIC AND ANAEROBIC Blood Culture adequate volume Performed at [facility], [address], [place], [place] [address]    Culture   Final    NO GROWTH 5 DAYS Performed at [facility], [address] [place], [place] [address]    Report Status 01/20/2023 FINAL  Final  Blood Culture (routine x 2)     Status: None   Collection Time: 01/15/23 10:00 PM   Specimen: BLOOD   Result Value Ref Range Status   Specimen Description   Final    BLOOD RIGHT ANTECUBITAL Performed at [facility], [address], [place], [place] [address]    Special Requests   Final    BOTTLES DRAWN AEROBIC AND ANAEROBIC Blood Culture adequate volume Performed at [facility], [address] [place], [place] [address]    Culture   Final    NO GROWTH 5 DAYS Performed at [facility], [address], [place], [place] [address]    Report Status 01/20/2023 FINAL  Final  Resp panel by RT-PCR (RSV, Flu A&B, Covid) Anterior Nasal Swab     Status: Abnormal   Collection Time: 01/15/23 10:17 PM   Specimen: Anterior Nasal Swab  Result Value Ref Range Status   SARS Coronavirus 2 by RT PCR POSITIVE (A) NEGATIVE Final    Comment: (NOTE) SARS-CoV-2 target nucleic acids are DETECTED.  The SARS-CoV-2 RNA is generally detectable in upper respiratory specimens during the acute phase of infection. Positive results are indicative of the presence of the identified virus, but do not rule out bacterial infection or co-infection with other pathogens not detected by the test. Clinical correlation with patient history and other diagnostic information is necessary to determine patient infection status. The expected result is Negative.  Fact Sheet for Patients: BloggerCourse.com  Fact Sheet for Healthcare Providers: SeriousBroker.it  This test is not yet approved or cleared by the [place] FDA  and  has been authorized for detection and/or diagnosis of SARS-CoV-2 by FDA under an Emergency Use Authorization (EUA).  This EUA will remain in effect (meaning this test can be used) for the duration of  the COVID-19 declaration under Section 564(b)(1) of the A ct, 21 U.S.C. section 360bbb-3(b)(1), unless the authorization is terminated or revoked sooner.     Influenza A by PCR NEGATIVE NEGATIVE Final    Influenza B by PCR NEGATIVE NEGATIVE Final    Comment: (NOTE) The Xpert Xpress SARS-CoV-2/FLU/RSV plus assay is intended as an aid in the diagnosis of influenza from Nasopharyngeal swab specimens and should not be used as a sole basis for treatment. Nasal washings and aspirates are unacceptable for Xpert Xpress SARS-CoV-2/FLU/RSV testing.  Fact Sheet for Patients: BloggerCourse.com  Fact Sheet for Healthcare Providers: SeriousBroker.it  This test is not yet approved or cleared by the [place] FDA and has been authorized for detection and/or diagnosis of SARS-CoV-2 by FDA under an Emergency Use Authorization (EUA). This EUA will remain in effect (meaning this test can be used) for the duration of the COVID-19 declaration under Section 564(b)(1) of the Act, 21 U.S.C. section 360bbb-3(b)(1), unless the authorization is terminated or revoked.     Resp Syncytial Virus by PCR NEGATIVE NEGATIVE Final    Comment: (NOTE) Fact Sheet for Patients: BloggerCourse.com  Fact Sheet for Healthcare Providers: SeriousBroker.it  This test is not yet approved or cleared by the [place] FDA and has been authorized for detection and/or diagnosis of SARS-CoV-2 by FDA under an Emergency Use Authorization (EUA). This EUA will remain in effect (meaning this test can be used) for the duration of the COVID-19 declaration under Section 564(b)(1) of the Act, 21 U.S.C. section 360bbb-3(b)(1), unless the authorization is terminated or revoked.  Performed at [facility], [address] [place], [place] [address]   Aerobic/Anaerobic Culture w Gram Stain (surgical/deep wound)     Status: None   Collection Time: 01/16/23  3:12 PM   Specimen: BILE  Result Value Ref Range Status   Specimen Description   Final    BILE Performed at [facility], [address], [place], [place] [address]    Special Requests   Final    NONE Performed at [facility], [address], [place], [place] [address]    Gram Stain NO WBC SEEN NO ORGANISMS SEEN   Final   Culture   Final    No growth aerobically or anaerobically. Performed at [facility], [address], [place], [place] [address]    Report Status 01/21/2023 FINAL  Final      Radiology Studies: No results found.    LOS: 10 days    Jacquelin Hawking, MD [facility] 01/25/2023, 12:28 PM   If 7PM-7AM, please contact night-coverage [email or web address]

## 2023-01-26 DIAGNOSIS — N1832 Chronic kidney disease, stage 3b: Secondary | ICD-10-CM | POA: Diagnosis not present

## 2023-01-26 DIAGNOSIS — K8000 Calculus of gallbladder with acute cholecystitis without obstruction: Secondary | ICD-10-CM | POA: Diagnosis not present

## 2023-01-26 DIAGNOSIS — A419 Sepsis, unspecified organism: Secondary | ICD-10-CM | POA: Diagnosis not present

## 2023-01-26 DIAGNOSIS — R6520 Severe sepsis without septic shock: Secondary | ICD-10-CM | POA: Diagnosis not present

## 2023-01-26 LAB — GLUCOSE, CAPILLARY
Glucose-Capillary: 96 mg/dL (ref 70–99)
Glucose-Capillary: 97 mg/dL (ref 70–99)
Glucose-Capillary: 114 mg/dL — ABNORMAL HIGH (ref 70–99)
Glucose-Capillary: 139 mg/dL — ABNORMAL HIGH (ref 70–99)
Glucose-Capillary: 151 mg/dL — ABNORMAL HIGH (ref 70–99)

## 2023-01-26 MED ORDER — INSULIN ASPART 100 UNIT/ML IJ SOLN: 0.0000 [IU] | Freq: Three times a day (TID) | INTRAMUSCULAR | Status: DC

## 2023-01-26 NOTE — Plan of Care (Signed)
  Problem: Respiratory: Goal: Will maintain a patent airway Outcome: Progressing   Problem: Education: Goal: Knowledge of risk factors and measures for prevention of condition will improve Outcome: Not Progressing   Problem: Fluid Volume: Goal: Hemodynamic stability will improve Outcome: Not Progressing   Problem: Skin Integrity: Goal: Risk for impaired skin integrity will decrease Outcome: Not Progressing

## 2023-01-26 NOTE — Progress Notes (Signed)
PROGRESS NOTE    [name]  PQD:[id number] DOB: Mar 19, 1928 DOA: 01/15/2023 PCP: Georgann Housekeeper, MD   Brief Narrative: Devin Vance is a 89 y.o. male with a history of Alzheimer's dementia, CAD, CKD stage IIIb, COPD, rheumatoid arthritis, hypertension, hyperlipidemia, prostate cancer, OSA not on CPAP.  Patient presented secondary to abdominal pain and nausea and was found to have evidence of acute cholecystitis with associated severe sepsis.  Patient was started on empiric antibiotics.  General surgery consulted and recommended no surgical management secondary to comorbidities.  Interventional radiology consulted and performed percutaneous cholecystomy tube placement with improvement in symptoms.  Culture data without identification of an organism.  Hospitalization complicated by decreased oral intake which complicated by underlying dementia.   Assessment and Plan:  Severe sepsis Present on admission and secondary to acute cholecystitis. Empiric Cefepime and Flagyl started with transition to Zosyn IV. Blood cultures obtained and are no growth. Sepsis physiology improved with treatment of acute cholecystitis.  Acute calculus cholecystitis Present on admission with associated severe sepsis. Empiric Zosyn started. General surgery consulted. Per general surgery patient is not a surgical candidate. IR consulted and placed a percutaneous cholecystostomy tube on 4/3. Drain culture without an identified organism. ID consulted and recommend transition to Augmentin for 14 days. -Continue Augmentin  Leukocytosis WBC of 41,000 on admission secondary to severe sepsis/acute cholecystitis. Resolved with treatment of infection.  CKD stage IIIb Stable.  Hypokalemia Recurrent. Resolved with repletion.  COVID-19 positive test Patient initially tested positive on 3/20 and remained positive on test two weeks after initial positive test. Patient not treated for COVID-19 infection.  CAD No chest  pain.  Thrombocytopenia Secondary to acute illness. Resolved.  Primary hypertension -Continue metoprolol succinate  Alzheimer's dementia -Continue Namenda  Dysphagia Patient evaluated by speech therapy. -Speech therapy recommendations (4/9): Diet recommendations: Dysphagia 1 (puree);Thin liquid;Other(comment) (Extra gravy and sauce) Liquids provided via: Cup;Straw Medication Administration: Crushed with puree Supervision: Trained caregiver to feed patient;Full supervision/cueing for compensatory strategies Compensations: Slow rate;Small sips/bites;Minimize environmental distractions Postural Changes and/or Swallow Maneuvers: Seated upright 90 degrees  Rheumatoid arthritis Patient is on Plaquenil as an outpatient which is on hold secondary to acute infection.  Hypothyroidism -Continue Synthroid 25 mcg daily  OSA Not using CPAP   DVT prophylaxis: SCDs Code Status:   Code Status: DNR Family Communication: Daughter and son-in-law at bedside Disposition Plan: Discharge to SNF when bed is available (4/15). Medically stable for discharge.   Consultants:  General surgery Interventional radiology Palliative care medicine Infectious disease  Procedures:  4/3: [place] and fluoro guided perc cholecystostomy catheter placement  Antimicrobials: Cefepime Flagyl Zosyn IV Augmentin   Subjective: Patient is sleepy this morning. No specific concerns noted.  Objective: BP 122/64 (BP Location: Right Arm)   Pulse 79   Temp 97.6 F (36.4 C) (Oral)   Resp 16   Ht 5\' 10"  (1.778 m)   Wt 87.2 kg   SpO2 96%   BMI 27.58 kg/m   Examination:  General exam: Appears calm and comfortable Respiratory system: Clear to auscultation. Respiratory effort normal. Cardiovascular system: S1 & S2 heard, RRR. No murmurs Gastrointestinal system: Abdomen is nondistended, soft and nontender. No organomegaly or masses felt. Normal bowel sounds heard. Central nervous system: Asleep but arouses  easily Musculoskeletal: No edema. No calf tenderness Skin: No cyanosis. No rashes   Data Reviewed: I have personally reviewed following labs and imaging studies  CBC Lab Results  Component Value Date   WBC 8.3 01/23/2023   RBC  3.66 (L) 01/23/2023   HGB 10.9 (L) 01/23/2023   HCT 34.3 (L) 01/23/2023   MCV 93.7 01/23/2023   MCH 29.8 01/23/2023   PLT 171 01/23/2023   MCHC 31.8 01/23/2023   RDW 13.4 01/23/2023   LYMPHSABS 1.0 01/23/2023   MONOABS 0.3 01/23/2023   EOSABS 0.4 01/23/2023   BASOSABS 0.0 01/23/2023     Last metabolic panel Lab Results  Component Value Date   NA 141 01/24/2023   K 3.7 01/24/2023   CL 109 01/24/2023   CO2 27 01/24/2023   BUN 18 01/24/2023   CREATININE 1.40 (H) 01/24/2023   GLUCOSE 92 01/24/2023   GFRNONAA 47 (L) 01/24/2023   GFRAA 47 (L) 07/15/2019   CALCIUM 8.2 (L) 01/24/2023   PROT 5.2 (L) 01/23/2023   ALBUMIN 2.3 (L) 01/23/2023   LABGLOB 1.9 07/15/2019   AGRATIO 2.1 07/15/2019   BILITOT 0.8 01/23/2023   ALKPHOS 84 01/23/2023   AST 47 (H) 01/23/2023   ALT 95 (H) 01/23/2023   ANIONGAP 5 01/24/2023    GFR: Estimated Creatinine Clearance: 34 mL/min (A) (by C-G formula based on SCr of 1.4 mg/dL (H)).  Recent Results (from the past 240 hour(s))  Aerobic/Anaerobic Culture w Gram Stain (surgical/deep wound)     Status: None   Collection Time: 01/16/23  3:12 PM   Specimen: BILE  Result Value Ref Range Status   Specimen Description   Final    BILE Performed at [facility], [address] [place], [place] [address]    Special Requests   Final    NONE Performed at [facility], [address], [place], [place] [address]    Gram Stain NO WBC SEEN NO ORGANISMS SEEN   Final   Culture   Final    No growth aerobically or anaerobically. Performed at [facility], [address], [place], [place] [address]    Report Status 01/21/2023 FINAL  Final      Radiology Studies: No results  found.    LOS: 11 days    Jacquelin Hawking, MD [facility] 01/26/2023, 1:55 PM   If 7PM-7AM, please contact night-coverage [email or web address]

## 2023-01-26 NOTE — TOC Progression Note (Signed)
Transition of Care [facility]) - Progression Note    Patient Details  Name: Devin Vance MRN: [id number] Date of Birth: 06-26-1928  Transition [facility]) CM/SW Contact  Otelia Santee, LCSW Phone Number: 01/26/2023, 1:58 PM  Clinical Narrative:    Spoke with pt's daughter, Darl Pikes to review bed offers. Pt's spouse and daughter agreeable to move forward with SNF placement at [facility]. Sent message to [facility] to confirm acceptance.    Expected Discharge Plan: Skilled Nursing Facility Barriers to Discharge: Other (must enter comment) (SNF bed available Monday 01/28/23)  Expected Discharge Plan and Services   Discharge Planning Services: CM Consult   Living arrangements for the past 2 months: Independent Living Facility ([facility])                                       Social Determinants of Health (SDOH) Interventions SDOH Screenings   Food Insecurity: No Food Insecurity (01/16/2023)  Housing: Low Risk  (01/19/2023)  Transportation Needs: No Transportation Needs (01/19/2023)  Utilities: Not At Risk (01/19/2023)  Tobacco Use: Low Risk  (01/19/2023)    Readmission Risk Interventions    01/22/2023   11:25 AM 01/16/2023    2:27 PM  Readmission Risk Prevention Plan  Transportation Screening Complete Complete  PCP or Specialist Appt within 3-5 Days Complete Complete  HRI or Home Care Consult Complete Complete  Social Work Consult for Recovery Care Planning/Counseling Complete Complete  Palliative Care Screening Complete Complete  Medication Review Oceanographer) Complete Complete

## 2023-01-27 DIAGNOSIS — N1832 Chronic kidney disease, stage 3b: Secondary | ICD-10-CM | POA: Diagnosis not present

## 2023-01-27 DIAGNOSIS — R6520 Severe sepsis without septic shock: Secondary | ICD-10-CM | POA: Diagnosis not present

## 2023-01-27 DIAGNOSIS — K8000 Calculus of gallbladder with acute cholecystitis without obstruction: Secondary | ICD-10-CM | POA: Diagnosis not present

## 2023-01-27 DIAGNOSIS — A419 Sepsis, unspecified organism: Secondary | ICD-10-CM | POA: Diagnosis not present

## 2023-01-27 LAB — GLUCOSE, CAPILLARY
Glucose-Capillary: 97 mg/dL (ref 70–99)
Glucose-Capillary: 102 mg/dL — ABNORMAL HIGH (ref 70–99)
Glucose-Capillary: 124 mg/dL — ABNORMAL HIGH (ref 70–99)
Glucose-Capillary: 99 mg/dL (ref 70–99)

## 2023-01-27 LAB — COMPREHENSIVE METABOLIC PANEL
ALT: 90 U/L — ABNORMAL HIGH (ref 0–44)
AST: 38 U/L (ref 15–41)
Albumin: 2.6 g/dL — ABNORMAL LOW (ref 3.5–5.0)
Alkaline Phosphatase: 99 U/L (ref 38–126)
Anion gap: 7 (ref 5–15)
BUN: 30 mg/dL — ABNORMAL HIGH (ref 8–23)
CO2: 24 mmol/L (ref 22–32)
Calcium: 8.1 mg/dL — ABNORMAL LOW (ref 8.9–10.3)
Chloride: 109 mmol/L (ref 98–111)
Creatinine, Ser: 1.12 mg/dL (ref 0.61–1.24)
GFR, Estimated: >60 mL/min (ref >60)
Glucose, Bld: 102 mg/dL — ABNORMAL HIGH (ref 70–99)
Potassium: 3.8 mmol/L (ref 3.5–5.1)
Sodium: 140 mmol/L (ref 135–145)
Total Bilirubin: 0.7 mg/dL (ref 0.3–1.2)
Total Protein: 5.6 g/dL — ABNORMAL LOW (ref 6.5–8.1)

## 2023-01-27 LAB — CBC
HCT: 37.4 % — ABNORMAL LOW (ref 39.0–52.0)
Hemoglobin: 11.9 g/dL — ABNORMAL LOW (ref 13.0–17.0)
MCH: 29.9 pg (ref 26.0–34.0)
MCHC: 31.8 g/dL (ref 30.0–36.0)
MCV: 94 fL (ref 80.0–100.0)
Platelets: 380 10*3/uL (ref 150–400)
RBC: 3.98 MIL/uL — ABNORMAL LOW (ref 4.22–5.81)
RDW: 14 % (ref 11.5–15.5)
WBC: 7.1 10*3/uL (ref 4.0–10.5)
nRBC: 0 % (ref 0.0–0.2)

## 2023-01-27 MED ORDER — SODIUM CHLORIDE 0.9 % IV SOLN: INTRAVENOUS | Status: DC

## 2023-01-27 MED ORDER — MELATONIN 3 MG PO TABS
3.0000 mg | ORAL_TABLET | Freq: Every day | ORAL | Status: DC
  Filled 2023-01-27: qty 1

## 2023-01-27 NOTE — Progress Notes (Signed)
PROGRESS NOTE    Devin Vance  BCW:[id number] DOB: Jan 09, 1928 DOA: 01/15/2023 PCP: Georgann Housekeeper, MD   Brief Narrative: Devin Vance is a 89 y.o. male with a history of Alzheimer's dementia, CAD, CKD stage IIIb, COPD, rheumatoid arthritis, hypertension, hyperlipidemia, prostate cancer, OSA not on CPAP.  Patient presented secondary to abdominal pain and nausea and was found to have evidence of acute cholecystitis with associated severe sepsis.  Patient was started on empiric antibiotics.  General surgery consulted and recommended no surgical management secondary to comorbidities.  Interventional radiology consulted and performed percutaneous cholecystomy tube placement with improvement in symptoms.  Culture data without identification of an organism.  Hospitalization complicated by decreased oral intake which complicated by underlying dementia.   Assessment and Plan:  Severe sepsis Present on admission and secondary to acute cholecystitis. Empiric Cefepime and Flagyl started with transition to Zosyn IV. Blood cultures obtained and are no growth. Sepsis physiology improved with treatment of acute cholecystitis.  Acute calculus cholecystitis Present on admission with associated severe sepsis. Empiric Zosyn started. General surgery consulted. Per general surgery patient is not a surgical candidate. IR consulted and placed a percutaneous cholecystostomy tube on 4/3. Drain culture without an identified organism. ID consulted and recommend transition to Augmentin for 14 days. -Continue Augmentin  Leukocytosis WBC of 41,000 on admission secondary to severe sepsis/acute cholecystitis. Resolved with treatment of infection.  Somnolence Unclear etiology. Patient declining oral intake. Obtained BMP and CBC which were mostly unrevealing except mildly elevated BUN. -IV fluids -Melatonin qHS  CKD stage IIIb Stable.  Hypokalemia Recurrent. Resolved with repletion.  COVID-19 positive test Patient  initially tested positive on 3/20 and remained positive on test two weeks after initial positive test. Patient not treated for COVID-19 infection.  CAD No chest pain.  Thrombocytopenia Secondary to acute illness. Resolved.  Primary hypertension -Continue metoprolol succinate  Alzheimer's dementia -Continue Namenda  Dysphagia Patient evaluated by speech therapy. -Speech therapy recommendations (4/9): Diet recommendations: Dysphagia 1 (puree);Thin liquid;Other(comment) (Extra gravy and sauce) Liquids provided via: Cup;Straw Medication Administration: Crushed with puree Supervision: Trained caregiver to feed patient;Full supervision/cueing for compensatory strategies Compensations: Slow rate;Small sips/bites;Minimize environmental distractions Postural Changes and/or Swallow Maneuvers: Seated upright 90 degrees  Rheumatoid arthritis Patient is on Plaquenil as an outpatient which is on hold secondary to acute infection.  Hypothyroidism -Continue Synthroid 25 mcg daily  OSA Not using CPAP   DVT prophylaxis: SCDs Code Status:   Code Status: DNR Family Communication: Caregiver at bedside Disposition Plan: Discharge to SNF when bed is available (4/15). Medically stable for discharge if mental status improved.   Consultants:  General surgery Interventional radiology Palliative care medicine Infectious disease  Procedures:  4/3: [place] and fluoro guided perc cholecystostomy catheter placement  Antimicrobials: Cefepime Flagyl Zosyn IV Augmentin   Subjective: Patient reports no concerns this morning. Per caregiver, patient has not been willing to oral food or drink. Per nursing, he is not taking medication well this morning either.  Objective: BP (!) 117/55 (BP Location: Right Arm)   Pulse 73   Temp 98.5 F (36.9 C) (Oral)   Resp 20   Ht 5\' 10"  (1.778 m)   Wt 90.4 kg   SpO2 95%   BMI 28.60 kg/m   Examination:  General exam: Appears calm and  comfortable Respiratory system: Clear to auscultation. Respiratory effort normal. Cardiovascular system: S1 & S2 heard, RRR. No murmurs, rubs, gallops or clicks. Gastrointestinal system: Abdomen is nondistended, soft and nontender. Normal bowel sounds heard. Central  nervous system: Somnolent but arouses and follows commands. Musculoskeletal: No edema. No calf tenderness     Data Reviewed: I have personally reviewed following labs and imaging studies  CBC Lab Results  Component Value Date   WBC 8.3 01/23/2023   RBC 3.66 (L) 01/23/2023   HGB 10.9 (L) 01/23/2023   HCT 34.3 (L) 01/23/2023   MCV 93.7 01/23/2023   MCH 29.8 01/23/2023   PLT 171 01/23/2023   MCHC 31.8 01/23/2023   RDW 13.4 01/23/2023   LYMPHSABS 1.0 01/23/2023   MONOABS 0.3 01/23/2023   EOSABS 0.4 01/23/2023   BASOSABS 0.0 01/23/2023     Last metabolic panel Lab Results  Component Value Date   NA 141 01/24/2023   K 3.7 01/24/2023   CL 109 01/24/2023   CO2 27 01/24/2023   BUN 18 01/24/2023   CREATININE 1.40 (H) 01/24/2023   GLUCOSE 92 01/24/2023   GFRNONAA 47 (L) 01/24/2023   GFRAA 47 (L) 07/15/2019   CALCIUM 8.2 (L) 01/24/2023   PROT 5.2 (L) 01/23/2023   ALBUMIN 2.3 (L) 01/23/2023   LABGLOB 1.9 07/15/2019   AGRATIO 2.1 07/15/2019   BILITOT 0.8 01/23/2023   ALKPHOS 84 01/23/2023   AST 47 (H) 01/23/2023   ALT 95 (H) 01/23/2023   ANIONGAP 5 01/24/2023    GFR: Estimated Creatinine Clearance: 37.3 mL/min (A) (by C-G formula based on SCr of 1.4 mg/dL (H)).  No results found for this or any previous visit (from the past 240 hour(s)).     Radiology Studies: No results found.    LOS: 12 days    Jacquelin Hawking, MD [facility] 01/27/2023, 12:32 PM   If 7PM-7AM, please contact night-coverage [email or web address]

## 2023-01-27 NOTE — Progress Notes (Signed)
Pt sleepy, spitting out medications along with apple sauce. Tried several times, not able to give morning medicine. MD aware.

## 2023-01-27 NOTE — Progress Notes (Signed)
Pt more alert and ate his lunch, took his morning medication later with no issues

## 2023-01-28 DIAGNOSIS — N1832 Chronic kidney disease, stage 3b: Secondary | ICD-10-CM | POA: Diagnosis not present

## 2023-01-28 DIAGNOSIS — K8000 Calculus of gallbladder with acute cholecystitis without obstruction: Secondary | ICD-10-CM | POA: Diagnosis not present

## 2023-01-28 DIAGNOSIS — A419 Sepsis, unspecified organism: Secondary | ICD-10-CM | POA: Diagnosis not present

## 2023-01-28 DIAGNOSIS — R6520 Severe sepsis without septic shock: Secondary | ICD-10-CM | POA: Diagnosis not present

## 2023-01-28 LAB — URINALYSIS, ROUTINE W REFLEX MICROSCOPIC
Bilirubin Urine: NEGATIVE
Glucose, UA: NEGATIVE mg/dL
Hgb urine dipstick: NEGATIVE
Ketones, ur: NEGATIVE mg/dL
Leukocytes,Ua: NEGATIVE
Nitrite: NEGATIVE
Protein, ur: NEGATIVE mg/dL
Specific Gravity, Urine: 1.016 (ref 1.005–1.030)
pH: 5 (ref 5.0–8.0)

## 2023-01-28 LAB — GLUCOSE, CAPILLARY
Glucose-Capillary: 89 mg/dL (ref 70–99)
Glucose-Capillary: 108 mg/dL — ABNORMAL HIGH (ref 70–99)
Glucose-Capillary: 105 mg/dL — ABNORMAL HIGH (ref 70–99)
Glucose-Capillary: 90 mg/dL (ref 70–99)

## 2023-01-28 NOTE — Progress Notes (Signed)
PROGRESS NOTE    FORREST MILLICAN  XAJ:[id number] DOB: 19-Apr-1928 DOA: 01/15/2023 PCP: Georgann Housekeeper, MD   Brief Narrative: Devin Vance is a 89 y.o. male with a history of Alzheimer's dementia, CAD, CKD stage IIIb, COPD, rheumatoid arthritis, hypertension, hyperlipidemia, prostate cancer, OSA not on CPAP.  Patient presented secondary to abdominal pain and nausea and was found to have evidence of acute cholecystitis with associated severe sepsis.  Patient was started on empiric antibiotics.  General surgery consulted and recommended no surgical management secondary to comorbidities.  Interventional radiology consulted and performed percutaneous cholecystomy tube placement with improvement in symptoms.  Culture data without identification of an organism.  Hospitalization complicated by decreased oral intake which complicated by underlying dementia.   Assessment and Plan:  Severe sepsis Present on admission and secondary to acute cholecystitis. Empiric Cefepime and Flagyl started with transition to Zosyn IV. Blood cultures obtained and are no growth. Sepsis physiology improved with treatment of acute cholecystitis.  Acute calculus cholecystitis Present on admission with associated severe sepsis. Empiric Zosyn started. General surgery consulted. Per general surgery patient is not a surgical candidate. IR consulted and placed a percutaneous cholecystostomy tube on 4/3. Drain culture without an identified organism. ID consulted and recommend transition to Augmentin for 14 days. -Continue Augmentin  Leukocytosis WBC of 41,000 on admission secondary to severe sepsis/acute cholecystitis. Resolved with treatment of infection.  Somnolence Unclear etiology. Patient has some improvement of oral intake but still decreased. Obtained BMP and CBC which were mostly unrevealing except mildly elevated BUN. -Check urinalysis -Melatonin qHS -Delirium precautions  CKD stage  IIIb Stable.  Hypokalemia Recurrent. Resolved with repletion.  COVID-19 positive test Patient initially tested positive on 3/20 and remained positive on test two weeks after initial positive test. Patient not treated for COVID-19 infection.  CAD No chest pain.  Thrombocytopenia Secondary to acute illness. Resolved.  Primary hypertension -Continue metoprolol succinate  Alzheimer's dementia -Continue Namenda  Dysphagia Patient evaluated by speech therapy. -Speech therapy recommendations (4/9): Diet recommendations: Dysphagia 1 (puree);Thin liquid;Other(comment) (Extra gravy and sauce) Liquids provided via: Cup;Straw Medication Administration: Crushed with puree Supervision: Trained caregiver to feed patient;Full supervision/cueing for compensatory strategies Compensations: Slow rate;Small sips/bites;Minimize environmental distractions Postural Changes and/or Swallow Maneuvers: Seated upright 90 degrees  Rheumatoid arthritis Patient is on Plaquenil as an outpatient which is on hold secondary to acute infection.  Hypothyroidism -Continue Synthroid 25 mcg daily  OSA Not using CPAP   DVT prophylaxis: SCDs Code Status:   Code Status: DNR Family Communication: Daughter at bedside Disposition Plan: Discharge to SNF when bed is available (4/16). Medically stable for discharge in 24 hours pending urine studies   Consultants:  General surgery Interventional radiology Palliative care medicine Infectious disease  Procedures:  4/3: [place] and fluoro guided perc cholecystostomy catheter placement  Antimicrobials: Cefepime Flagyl Zosyn IV Augmentin   Subjective: Patient reports being tired. No other issues noted.  Objective: BP 125/61 (BP Location: Right Arm)   Pulse 69   Temp 98 F (36.7 C) (Oral)   Resp 18   Ht 5\' 10"  (1.778 m)   Wt 91.7 kg   SpO2 96%   BMI 29.01 kg/m   Examination:  General exam: Appears calm and comfortable Respiratory system: Clear to  auscultation. Respiratory effort normal. Cardiovascular system: S1 & S2 heard, RRR. Gastrointestinal system: Abdomen is nondistended, soft and nontender. Normal bowel sounds heard. Central nervous system: Alert Musculoskeletal: No edema. No calf tenderness   Data Reviewed: I have personally reviewed following  labs and imaging studies  CBC Lab Results  Component Value Date   WBC 7.1 01/27/2023   RBC 3.98 (L) 01/27/2023   HGB 11.9 (L) 01/27/2023   HCT 37.4 (L) 01/27/2023   MCV 94.0 01/27/2023   MCH 29.9 01/27/2023   PLT 380 01/27/2023   MCHC 31.8 01/27/2023   RDW 14.0 01/27/2023   LYMPHSABS 1.0 01/23/2023   MONOABS 0.3 01/23/2023   EOSABS 0.4 01/23/2023   BASOSABS 0.0 01/23/2023     Last metabolic panel Lab Results  Component Value Date   NA 140 01/27/2023   K 3.8 01/27/2023   CL 109 01/27/2023   CO2 24 01/27/2023   BUN 30 (H) 01/27/2023   CREATININE 1.12 01/27/2023   GLUCOSE 102 (H) 01/27/2023   GFRNONAA >60 01/27/2023   GFRAA 47 (L) 07/15/2019   CALCIUM 8.1 (L) 01/27/2023   PROT 5.6 (L) 01/27/2023   ALBUMIN 2.6 (L) 01/27/2023   LABGLOB 1.9 07/15/2019   AGRATIO 2.1 07/15/2019   BILITOT 0.7 01/27/2023   ALKPHOS 99 01/27/2023   AST 38 01/27/2023   ALT 90 (H) 01/27/2023   ANIONGAP 7 01/27/2023    GFR: Estimated Creatinine Clearance: 46.9 mL/min (by C-G formula based on SCr of 1.12 mg/dL).  No results found for this or any previous visit (from the past 240 hour(s)).     Radiology Studies: No results found.    LOS: 13 days    Jacquelin Hawking, MD [facility] 01/28/2023, 2:02 PM   If 7PM-7AM, please contact night-coverage [email or web address]

## 2023-01-28 NOTE — TOC Progression Note (Addendum)
Transition of Care [facility]) - Progression Note    Patient Details  Name: Devin Vance MRN: [id number] Date of Birth: 18-Oct-1927  Transition of Care [facility]) CM/SW Contact  Adrian Prows, RN Phone Number: 01/28/2023, 11:02 AM  Clinical Narrative:    Pt's dtr accepts bed at [facility]; spoke w/ pt and dtr Darl Pikes in room; spoke w/ [place] at facility; she says pt can admit tomorrow; she gave RM # [phone], call report # [phone]; pt and dtr given room # and agree to d/c plan; pt will be transported by [facility].  -[id number]- order received for home Palliative Care referral; called [place] at facility to inquire if contracted agency on site; awaiting return call.   Expected Discharge Plan: Skilled Nursing Facility Barriers to Discharge: Other (must enter comment) (SNF bed available Monday 01/28/23)  Expected Discharge Plan and Services   Discharge Planning Services: CM Consult   Living arrangements for the past 2 months: Independent Living Facility ([facility])                                       Social Determinants of Health (SDOH) Interventions SDOH Screenings   Food Insecurity: No Food Insecurity (01/16/2023)  Housing: Low Risk  (01/19/2023)  Transportation Needs: No Transportation Needs (01/19/2023)  Utilities: Not At Risk (01/19/2023)  Tobacco Use: Low Risk  (01/19/2023)    Readmission Risk Interventions    01/22/2023   11:25 AM 01/16/2023    2:27 PM  Readmission Risk Prevention Plan  Transportation Screening Complete Complete  PCP or Specialist Appt within 3-5 Days Complete Complete  HRI or Home Care Consult Complete Complete  Social Work Consult for Recovery Care Planning/Counseling Complete Complete  Palliative Care Screening Complete Complete  Medication Review Oceanographer) Complete Complete

## 2023-01-28 NOTE — Plan of Care (Signed)
  Problem: Fluid Volume: Goal: Ability to maintain a balanced intake and output will improve Outcome: Progressing   Problem: Nutritional: Goal: Maintenance of adequate nutrition will improve Outcome: Progressing   Problem: Skin Integrity: Goal: Risk for impaired skin integrity will decrease Outcome: Progressing   Problem: Respiratory: Goal: Ability to maintain adequate ventilation will improve Outcome: Adequate for Discharge   Problem: Education: Goal: Knowledge of risk factors and measures for prevention of condition will improve Outcome: Completed/Met   Problem: Respiratory: Goal: Will maintain a patent airway Outcome: Completed/Met   Problem: Metabolic: Goal: Ability to maintain appropriate glucose levels will improve Outcome: Completed/Met

## 2023-01-28 NOTE — Progress Notes (Signed)
Speech Language Pathology Treatment: Dysphagia  Patient Details Name: LOCHLIN SACCO MRN: [id number] DOB: 06-26-1928 Today's Date: 01/28/2023 Time: 6606-3016 SLP Time Calculation (min) (ACUTE ONLY): 10 min  Assessment / Plan / Recommendation Clinical Impression  Patient seen by SLP for skilled treatment focused on dysphagia goals. Patient was asleep and so no PO's given and focus of session was on education with daughter. She reported that he seems to do well with eating and swallowing when he is awake but the challenge is finding times when he is adequately awake and alert. SLP discussed importance of maximizing PO intake when he is able to consume it, such as supplement shakes, etc and foods with nutritive value. SLP recommended daughter consult with dietician with further questions. SLP briefly reviewed MBS results from 01/19/23 which showed mainly an oral phase dysphagia but with good pharyngeal phase and no aspiration. Currently, plan is for patient to discharge to SNF next date. SLP will continue to follow patient while admitted.     HPI HPI: 89 y.o. male with medical history significant for Alzheimer's dementia, CAD, CKD stage IIIb, COPD, rheumatoid arthritis, HTN, HLD, prostate cancer, OSA not using CPAP, recent hospitalization from 01/02/2023-12/07/2022 with COVID-19 infection treated with Paxlovid and oral steroids presented with worsening abdominal pain and nausea along with fever.  On presentation, WBC was 41, creatinine 1.58, AST 98, ALT 207, alkaline phosphatase 124, total bili of 2, lipase 24, lactic acid 3.4.  COVID-19 PCR positive.  Chest x-ray showed low lung volumes without focal consolidation, edema or effusion.  CT of abdomen/pelvis with contrast showed cholelithiasis with acute cholecystitis.  General surgery was consulted.  He was started on IV fluids and antibiotics.  Family denies patient having diagnosis of Parkinson's but do report that he participates in class that helps him with  physical normal ability and and voiced drink.  They report his ambulation has diminished and his voice has weakened some since COVID.  Daughter reports patient with occasional cough with intake before admission, but wife denies this occurring.      SLP Plan  Continue with current plan of care      Recommendations for follow up therapy are one component of a multi-disciplinary discharge planning process, led by the attending physician.  Recommendations may be updated based on patient status, additional functional criteria and insurance authorization.    Recommendations  Diet recommendations: Dysphagia 1 (puree);Thin liquid Liquids provided via: Cup;Straw Medication Administration: Crushed with puree Supervision: Trained caregiver to feed patient;Full supervision/cueing for compensatory strategies Compensations: Slow rate;Small sips/bites;Minimize environmental distractions Postural Changes and/or Swallow Maneuvers: Seated upright 90 degrees                  Oral care BID   Frequent or constant Supervision/Assistance Dysphagia, oral phase (R13.11)     Continue with current plan of care     Angela Nevin, MA, CCC-SLP Speech Therapy

## 2023-01-28 NOTE — Progress Notes (Signed)
  Daily Progress Note   Patient Name: Devin Vance       Date: 01/28/2023 DOB: [birth year]  Age: 89 y.o. MRN#: [id number] Attending Physician: Narda Bonds, MD Primary Care Physician: Georgann Housekeeper, MD Admit Date: 01/15/2023 Length of Stay: 13 days  As per prior discussions with patient's family, seeking rehab placed. Referrals made over the past week as patient worked with PT/OT. Family has now accepted rehab bed offer at [facility]. Planning for discharge to rehab tomorrow with transport by [facility].  PMT had already recommended to family follow up with home palliative medicine team to continue discussions regarding care moving forward, especially if patient doesn't do well in rehab. PMT had already discussed home palliative can assist with transition to home hospice when needed. TOC consult placed to assist with coordination for this.   Please reach out if any further acute palliative medicine team needs arise. Thank you.    Alvester Morin, DO Palliative Care Provider PMT # [phone]

## 2023-01-28 NOTE — Progress Notes (Signed)
Pt not swallowing his meds when given. He is unable to follow commands when instructed. This RN was able to let pt spit out some meds that he took. NP Garner Nash made aware. Will continue to monitor pt.

## 2023-01-28 NOTE — Progress Notes (Signed)
Physical Therapy Treatment Patient Details Name: Devin Vance MRN: [id number] DOB: [birth year] Today's Date: 01/28/2023   History of Present Illness Pt is 89 yo male admitted on 01/15/23 with severe sepsis due to acute calculus cholecystitis.  Pt s/p IR guided percutaneous cholecystostomy tube on 01/16/23.  Pt with hx including recent hospitalization for COVID, Alzheimer's dementia, CAD, CKD stage IIIb, COPD, rheumatoid arthritis, HTN, HLD, prostate cancer, OSA not using CPAP    PT Comments    Total assist pt supine to sit with use of chuck pads to sit EOB. Total assist +3 to transfer from sitting to standing. Assisted pt to ambulate in hallway 20 ft with EVA walker, +3 assist for safety and equipment. Recliner followed, assisted transfer to recliner in hallway.    Recommendations for follow up therapy are one component of a multi-disciplinary discharge planning process, led by the attending physician.  Recommendations may be updated based on patient status, additional functional criteria and insurance authorization.  Follow Up Recommendations  Can patient physically be transported by private vehicle: No    Assistance Recommended at Discharge Frequent or constant Supervision/Assistance  Patient can return home with the following Two people to help with walking and/or transfers;Two people to help with bathing/dressing/bathroom;Assist for transportation;Assistance with cooking/housework;Direct supervision/assist for medications management;Direct supervision/assist for financial management;Assistance with feeding   Equipment Recommendations  Wheelchair (measurements PT);Wheelchair cushion (measurements PT)    Recommendations for Other Services       Precautions / Restrictions Precautions Precautions: Fall Precaution Comments: right lateral percutaneous cholecystostomy tube on 4/3 Restrictions Weight Bearing Restrictions: No Other Position/Activity Restrictions: right hand tender to  touch/grip due to RA     Mobility  Bed Mobility Overal bed mobility: Needs Assistance Bed Mobility: Supine to Sit Rolling:  (+3 Assist) Sidelying to sit:  (+3 assist) Supine to sit: Total assist (+3 assist) Sit to supine: Total assist (+3 assist)   General bed mobility comments: not resistive to movement, attempted to assist with moving LEs over EOB however mostly total assist Patient Response: Flat affect  Transfers Overall transfer level: Needs assistance Equipment used: Bilateral platform walker Transfers: Sit to/from Stand Sit to Stand: Total assist, From elevated surface           General transfer comment: +3 assist for safety and equipment    Ambulation/Gait Ambulation/Gait assistance: Max assist (+3 assist for safety and equipment) Gait Distance (Feet): 20 Feet Assistive device: Fara Boros Gait Pattern/deviations: Step-to pattern, Antalgic, Trunk flexed Gait velocity: decr     General Gait Details: Ambulating once initiated, then initaited self.   Stairs             Wheelchair Mobility    Modified Rankin (Stroke Patients Only)       Balance                                            Cognition Arousal/Alertness: Awake/alert Behavior During Therapy: Flat affect Overall Cognitive Status: History of cognitive impairments - at baseline Area of Impairment: Following commands, Safety/judgement, Problem solving                 Orientation Level: Disoriented to, Place, Time, Situation     Following Commands: Follows one step commands inconsistently, Follows one step commands with increased time Safety/Judgement: Decreased awareness of safety, Decreased awareness of deficits   Problem Solving: Slow processing, Decreased initiation, Difficulty  sequencing, Requires verbal cues, Requires tactile cues General Comments: lethargic; eyes closed; followed commands after hand held assist        Exercises      General Comments         Pertinent Vitals/Pain Pain Assessment Pain Assessment: No/denies pain Pain Score: 0-No pain Breathing: normal Negative Vocalization: occasional moan/groan, low speech, negative/disapproving quality Facial Expression: smiling or inexpressive Body Language: relaxed Consolability: no need to console PAINAD Score: 1 Pain Location: says yes to right hand, caregiver reports tenderness due to RA Pain Descriptors / Indicators: Grimacing, Moaning Pain Intervention(s): Monitored during session, Repositioned    Home Living Family/patient expects to be discharged to:: Skilled nursing facility Living Arrangements: Spouse/significant other Available Help at Discharge: Family;Personal care attendant;Available 24 hours/day Type of Home: Independent living facility Home Access: Level entry;Elevator       Home Layout: One level Home Equipment: Rollator (4 wheels)      Prior Function            PT Goals (current goals can now be found in the care plan section) Acute Rehab PT Goals Patient Stated Goal: wife reports wants rehab at d/c PT Goal Formulation: With patient/family Time For Goal Achievement: 02/01/23 Potential to Achieve Goals: Good Progress towards PT goals: Progressing toward goals    Frequency    Min 2X/week      PT Plan Current plan remains appropriate    Co-evaluation              AM-PAC PT "6 Clicks" Mobility   Outcome Measure  Help needed turning from your back to your side while in a flat bed without using bedrails?: Total Help needed moving from lying on your back to sitting on the side of a flat bed without using bedrails?: Total Help needed moving to and from a bed to a chair (including a wheelchair)?: Total Help needed standing up from a chair using your arms (e.g., wheelchair or bedside chair)?: Total Help needed to walk in hospital room?: Total Help needed climbing 3-5 steps with a railing? : Total 6 Click Score: 6    End of Session  Equipment Utilized During Treatment: Gait belt Activity Tolerance: Patient limited by fatigue;Patient tolerated treatment well Patient left: in chair;with call bell/phone within reach;with chair alarm set Nurse Communication: Mobility status PT Visit Diagnosis: Muscle weakness (generalized) (M62.81)     Time: 3536-1443 PT Time Calculation (min) (ACUTE ONLY): 20 min  Charges:  $Gait Training: 8-22 mins                     Sharlene Motts, [address]

## 2023-01-28 NOTE — Care Management Important Message (Signed)
Important Message  Patient Details IM Letter placed in room. Name: KIAEEM GOOLD MRN: [id number] Date of Birth: 07/30/1928   Medicare Important Message Given:  Yes     Caren Macadam 01/28/2023, 1:48 PM

## 2023-01-29 DIAGNOSIS — R1311 Dysphagia, oral phase: Secondary | ICD-10-CM | POA: Diagnosis not present

## 2023-01-29 DIAGNOSIS — R2681 Unsteadiness on feet: Secondary | ICD-10-CM | POA: Diagnosis not present

## 2023-01-29 DIAGNOSIS — Z8616 Personal history of COVID-19: Secondary | ICD-10-CM | POA: Diagnosis not present

## 2023-01-29 DIAGNOSIS — R739 Hyperglycemia, unspecified: Secondary | ICD-10-CM | POA: Diagnosis not present

## 2023-01-29 DIAGNOSIS — I2510 Atherosclerotic heart disease of native coronary artery without angina pectoris: Secondary | ICD-10-CM | POA: Diagnosis not present

## 2023-01-29 DIAGNOSIS — K8000 Calculus of gallbladder with acute cholecystitis without obstruction: Secondary | ICD-10-CM | POA: Diagnosis not present

## 2023-01-29 DIAGNOSIS — I10 Essential (primary) hypertension: Secondary | ICD-10-CM | POA: Diagnosis not present

## 2023-01-29 DIAGNOSIS — R4789 Other speech disturbances: Secondary | ICD-10-CM | POA: Diagnosis not present

## 2023-01-29 DIAGNOSIS — J449 Chronic obstructive pulmonary disease, unspecified: Secondary | ICD-10-CM | POA: Diagnosis not present

## 2023-01-29 DIAGNOSIS — Z7401 Bed confinement status: Secondary | ICD-10-CM | POA: Diagnosis not present

## 2023-01-29 DIAGNOSIS — A419 Sepsis, unspecified organism: Secondary | ICD-10-CM | POA: Diagnosis not present

## 2023-01-29 DIAGNOSIS — E785 Hyperlipidemia, unspecified: Secondary | ICD-10-CM | POA: Diagnosis not present

## 2023-01-29 DIAGNOSIS — E039 Hypothyroidism, unspecified: Secondary | ICD-10-CM | POA: Diagnosis not present

## 2023-01-29 DIAGNOSIS — M6281 Muscle weakness (generalized): Secondary | ICD-10-CM | POA: Diagnosis not present

## 2023-01-29 DIAGNOSIS — C61 Malignant neoplasm of prostate: Secondary | ICD-10-CM | POA: Diagnosis not present

## 2023-01-29 DIAGNOSIS — M069 Rheumatoid arthritis, unspecified: Secondary | ICD-10-CM | POA: Diagnosis not present

## 2023-01-29 DIAGNOSIS — K219 Gastro-esophageal reflux disease without esophagitis: Secondary | ICD-10-CM | POA: Diagnosis not present

## 2023-01-29 DIAGNOSIS — F03918 Unspecified dementia, unspecified severity, with other behavioral disturbance: Secondary | ICD-10-CM | POA: Diagnosis not present

## 2023-01-29 DIAGNOSIS — R278 Other lack of coordination: Secondary | ICD-10-CM | POA: Diagnosis not present

## 2023-01-29 DIAGNOSIS — I714 Abdominal aortic aneurysm, without rupture, unspecified: Secondary | ICD-10-CM | POA: Diagnosis not present

## 2023-01-29 DIAGNOSIS — R6520 Severe sepsis without septic shock: Secondary | ICD-10-CM | POA: Diagnosis not present

## 2023-01-29 DIAGNOSIS — N183 Chronic kidney disease, stage 3 unspecified: Secondary | ICD-10-CM | POA: Diagnosis not present

## 2023-01-29 DIAGNOSIS — R2689 Other abnormalities of gait and mobility: Secondary | ICD-10-CM | POA: Diagnosis not present

## 2023-01-29 DIAGNOSIS — L89326 Pressure-induced deep tissue damage of left buttock: Secondary | ICD-10-CM | POA: Diagnosis not present

## 2023-01-29 DIAGNOSIS — R404 Transient alteration of awareness: Secondary | ICD-10-CM | POA: Diagnosis not present

## 2023-01-29 DIAGNOSIS — E872 Acidosis, unspecified: Secondary | ICD-10-CM | POA: Diagnosis not present

## 2023-01-29 LAB — GLUCOSE, CAPILLARY
Glucose-Capillary: 95 mg/dL (ref 70–99)
Glucose-Capillary: 111 mg/dL — ABNORMAL HIGH (ref 70–99)

## 2023-01-29 MED ORDER — MELATONIN 3 MG PO TABS: 3.0000 mg | ORAL_TABLET | Freq: Every evening | ORAL | 0 refills | Status: AC | PRN

## 2023-01-29 MED ORDER — AMOXICILLIN-POT CLAVULANATE 875-125 MG PO TABS: 1.0000 | ORAL_TABLET | Freq: Two times a day (BID) | ORAL | Status: AC

## 2023-01-29 NOTE — Discharge Summary (Signed)
Physician Discharge Summary   Patient: Devin Vance MRN: [id number] DOB: 1927/11/22  Admit date:     01/15/2023  Discharge date: 01/29/23  Discharge Physician: Jacquelin Hawking, MD   PCP: Georgann Housekeeper, MD   Recommendations at discharge:  IR follow-up in 6 weeks Continue antibiotics with last day of 4/25 Palliative care referral  Discharge Diagnoses: Principal Problem:   Severe sepsis with lactic acidosis Active Problems:   Acute calculous cholecystitis   Chronic kidney disease, stage 3b   Lab test positive for detection of COVID-19 virus   Essential hypertension   Rheumatoid arthritis involving multiple sites   Alzheimer's dementia with agitation   Hyperglycemia   Coronary artery disease   Hypothyroidism  Resolved Problems:   * No resolved hospital problems. *  Hospital Course: Devin Vance is a 89 y.o. male with a history of Alzheimer's dementia, CAD, CKD stage IIIb, COPD, rheumatoid arthritis, hypertension, hyperlipidemia, prostate cancer, OSA not on CPAP.  Patient presented secondary to abdominal pain and nausea and was found to have evidence of acute cholecystitis with associated severe sepsis.  Patient was started on empiric antibiotics.  General surgery consulted and recommended no surgical management secondary to comorbidities.  Interventional radiology consulted and performed percutaneous cholecystomy tube placement with improvement in symptoms.  Culture data without identification of an organism.  Hospitalization complicated by decreased oral intake which complicated by underlying dementia.  Assessment and Plan:  Severe sepsis Present on admission and secondary to acute cholecystitis. Empiric Cefepime and Flagyl started with transition to Zosyn IV. Blood cultures obtained and are no growth. Sepsis physiology improved with treatment of acute cholecystitis.   Acute calculus cholecystitis Present on admission with associated severe sepsis. Empiric Zosyn started. General  surgery consulted. Per general surgery patient is not a surgical candidate. IR consulted and placed a percutaneous cholecystostomy tube on 4/3. Drain culture without an identified organism. ID consulted and recommend transition to Augmentin for 14 days from 4/11. Last day of antibiotics is 4/25. IR recommendations for TID flushes with 5 cc of normal saline.   Leukocytosis WBC of 41,000 on admission secondary to severe sepsis/acute cholecystitis. Resolved with treatment of infection.   Somnolence Unclear etiology. Patient has some improvement of oral intake but still decreased. Obtained BMP and CBC which were mostly unrevealing except mildly elevated BUN. Urinalysis unremarkable for likely infection. Waxing and waning mentation depending on the day. Likely related to underlying dementia. Recommend palliative care outpatient consult/follow-up. Continue melatonin at bedtime as needed.   CKD stage IIIb Stable.   Hypokalemia Recurrent. Resolved with repletion.   COVID-19 positive test Patient initially tested positive on 3/20 and remained positive on test two weeks after initial positive test. Patient not treated for COVID-19 infection.   CAD No chest pain.   Thrombocytopenia Secondary to acute illness. Resolved.   Primary hypertension Continue metoprolol succinate.   Alzheimer's dementia Continue Namenda.   Dysphagia Patient evaluated by speech therapy. -Speech therapy recommendations (4/15): Diet recommendations: Dysphagia 1 (puree);Thin liquid Liquids provided via: Cup;Straw Medication Administration: Crushed with puree Supervision: Trained caregiver to feed patient;Full supervision/cueing for compensatory strategies Compensations: Slow rate;Small sips/bites;Minimize environmental distractions Postural Changes and/or Swallow Maneuvers: Seated upright 90 degrees   Rheumatoid arthritis Patient is on Plaquenil as an outpatient which is on hold secondary to acute infection.    Hypothyroidism -Continue Synthroid 25 mcg daily   OSA Not using CPAP   Consultants: General surgery Interventional radiology Palliative care medicine Infectious disease Procedures performed:  4/3: [place] and fluoro  guided perc cholecystostomy catheter placement   Disposition: Skilled nursing facility Diet recommendation: Dysphagia 1 diet   DISCHARGE MEDICATION: Allergies as of 01/29/2023       Reactions   Lorazepam Other (See Comments)   Generalized weakness        Medication List     TAKE these medications    amoxicillin-clavulanate 875-125 MG tablet Commonly known as: AUGMENTIN Take 1 tablet by mouth every 12 (twelve) hours for 10 days.   aspirin EC 81 MG tablet Take 81 mg by mouth at bedtime.   FLONASE ALLERGY RELIEF NA Place 2 sprays into both nostrils daily as needed (Rhinitis).   guaiFENesin 600 MG 12 hr tablet Commonly known as: MUCINEX Take 1 tablet (600 mg total) by mouth 2 (two) times daily.   hydroxychloroquine 200 MG tablet Commonly known as: PLAQUENIL Take 200 mg by mouth See admin instructions. Take 200 mg (1 tablet) every other day, alternating with 400 mg (2 tablets) every other day.   ipratropium-albuterol 0.5-2.5 (3) MG/3ML Soln Commonly known as: DUONEB Take 3 mLs by nebulization 2 (two) times daily.   levothyroxine 25 MCG tablet Commonly known as: SYNTHROID Take 25 mcg by mouth daily before breakfast.   melatonin 3 MG Tabs tablet Take 1 tablet (3 mg total) by mouth at bedtime as needed.   memantine 10 MG tablet Commonly known as: NAMENDA Take 1 tablet (10 mg total) by mouth 2 (two) times daily.   metoprolol succinate 25 MG 24 hr tablet Commonly known as: TOPROL-XL Take 12.5 mg by mouth daily.   modafinil 100 MG tablet Commonly known as: PROVIGIL Take 1 tablet (100 mg total) by mouth daily.   multivitamin capsule Take 1 capsule by mouth daily.   Normal Saline Flush 0.9 % Soln Inject 10 mLs into the vein daily.    pantoprazole 40 MG tablet Commonly known as: PROTONIX Take 1 tablet (40 mg total) by mouth daily.   PROBIOTIC ACIDOPHILUS PO Take 1 tablet by mouth daily.   QUEtiapine 25 MG tablet Commonly known as: SEROQUEL Take 1 tablet (25 mg total) by mouth at bedtime.   Vitamin B12 1000 MCG Tbcr Take 2,500 mcg by mouth daily.   VITAMIN D-3 PO Take 1,000 Units by mouth daily.        Contact information for follow-up providers     Berna Bue, MD Follow up.   Specialty: General Surgery Why: call as needed Contact information: [address] [place] [address] [phone]         [facility] Follow up.   Why: IR scheduler will call with appoinment date and time. Please call [phone] with any questions or concerns Contact information: [address] [place] [address] [phone]              Contact information for after-discharge care     Destination     [facility] .   Service: Skilled Nursing Contact information: [address] Test Update Address [place] [address] [phone]                    Discharge Exam: BP (!) 145/64   Pulse 74   Temp 98.1 F (36.7 C) (Oral)   Resp 18   Ht  (1.778 m)   Wt 88.1 kg   SpO2 95%   BMI 27.87 kg/m   General exam: Appears calm and comfortable Respiratory system: Clear to auscultation. Respiratory effort  normal. Cardiovascular system: S1 & S2 heard, RRR. Gastrointestinal system: Abdomen is nondistended, soft and nontender. Normal bowel sounds heard. Central nervous system: Alert and oriented to person. Musculoskeletal: No edema. No calf tenderness   Condition at discharge: stable  The results of significant diagnostics from this hospitalization (including imaging, microbiology, ancillary and laboratory) are listed below for reference.   Imaging Studies: DG Swallowing Func-Speech Pathology  Result  Date: 01/19/2023 Table formatting from the original result was not included. Modified Barium Swallow Study Patient Details Name: AADITH RAUDENBUSH MRN: [id number] Date of Birth: Feb 23, 1928 Today's Date: 01/19/2023 HPI/PMH: HPI: 89 y.o. male with medical history significant for Alzheimer's dementia, CAD, CKD stage IIIb, COPD, rheumatoid arthritis, HTN, HLD, prostate cancer, OSA not using CPAP, recent hospitalization from 01/02/2023-12/07/2022 with COVID-19 infection treated with Paxlovid and oral steroids presented with worsening abdominal pain and nausea along with fever.  On presentation, WBC was 41, creatinine 1.58, AST 98, ALT 207, alkaline phosphatase 124, total bili of 2, lipase 24, lactic acid 3.4.  COVID-19 PCR positive.  Chest x-ray showed low lung volumes without focal consolidation, edema or effusion.  CT of abdomen/pelvis with contrast showed cholelithiasis with acute cholecystitis.  General surgery was consulted.  He was started on IV fluids and antibiotics.  Family denies patient having diagnosis of Parkinson's but do report that he participates in class that helps him with physical normal ability and and voiced drink.  They report his ambulation has diminished and his voice has weakened some since COVID.  Daughter reports patient with occasional cough with intake before admission, but wife denies this occurring. Clinical Impression: Clinical Impression: Patient presents with a moderately impaired oral phase of swallow but with pharyngeal phase appearing functional for his age. Swallow was initiated at level of vallecular sinus, only trace amount of barium observed sitting above PES after majority of liquid and solid swallows. Patient exhibited good airway protection, with full epiglottic inversion, no penetration or aspiration observed with any of the tested consistencies. No significant pharyngeal retention of barium observed. During oral phase, patient exhibited significantly delayed anterior to posterior  transit, reduced bolus cohesion within oral cavity and PO residuals remaining on tongue, base of tongue and floor of mouth. He had the most difficulty with thicker consistencies such as honey thick liquids and solids. SLP recommending to continue with full liquids and will plan for advanced solids trials at bedside. Factors that may increase risk of adverse event in presence of aspiration Rubye Oaks & Clearance Coots 2021): Factors that may increase risk of adverse event in presence of aspiration Rubye Oaks & Clearance Coots 2021): Poor general health and/or compromised immunity; Reduced cognitive function; Limited mobility; Frail or deconditioned; Dependence for feeding and/or oral hygiene; Weak cough Recommendations/Plan: Swallowing Evaluation Recommendations Swallowing Evaluation Recommendations Recommendations: PO diet PO Diet Recommendation: Full liquid diet; Thin liquids (Level 0) Liquid Administration via: Cup; Straw; Spoon Medication Administration: Crushed with puree Supervision: Full assist for feeding; Full supervision/cueing for swallowing strategies Swallowing strategies  : Slow rate; Small bites/sips; Check for pocketing or oral holding Postural changes: Position pt fully upright for meals Oral care recommendations: Oral care BID (2x/day); Oral care before PO Treatment Plan Treatment Plan Treatment recommendations: Therapy as outlined in treatment plan below Follow-up recommendations: Skilled nursing-short term rehab (<3 hours/day) Functional status assessment: Patient has had a recent decline in their functional status and demonstrates the ability to make significant improvements in function in a reasonable and predictable amount of time. Treatment frequency: Min 2x/week Treatment duration: 1 week Interventions:  Aspiration precaution training; Compensatory techniques; Patient/family education; Trials of upgraded texture/liquids; Diet toleration management by SLP Recommendations Recommendations for follow up therapy are one  component of a multi-disciplinary discharge planning process, led by the attending physician.  Recommendations may be updated based on patient status, additional functional criteria and insurance authorization. Assessment: Orofacial Exam: Orofacial Exam Oral Cavity: Oral Hygiene: WFL Oral Cavity - Dentition: Dentures, top; Dentures, bottom Orofacial Anatomy: WFL Oral Motor/Sensory Function: Unable to test Anatomy: Anatomy: Suspected cervical osteophytes Boluses Administered: Boluses Administered Boluses Administered: Thin liquids (Level 0); Mildly thick liquids (Level 2, nectar thick); Moderately thick liquids (Level 3, honey thick); Puree; Solid  Oral Impairment Domain: Oral Impairment Domain Lip Closure: No labial escape Tongue control during bolus hold: Not tested Bolus preparation/mastication: Slow prolonged chewing/mashing with complete recollection Bolus transport/lingual motion: Slow tongue motion Oral residue: Residue collection on oral structures Location of oral residue : Floor of mouth; Tongue Initiation of pharyngeal swallow : Valleculae  Pharyngeal Impairment Domain: Pharyngeal Impairment Domain Soft palate elevation: No bolus between soft palate (SP)/pharyngeal wall (PW) Laryngeal elevation: Complete superior movement of thyroid cartilage with complete approximation of arytenoids to epiglottic petiole Anterior hyoid excursion: Complete anterior movement Epiglottic movement: Complete inversion Laryngeal vestibule closure: Complete, no air/contrast in laryngeal vestibule Pharyngeal stripping wave : Present - diminished Pharyngeal contraction (A/P view only): N/A Pharyngoesophageal segment opening: Complete distension and complete duration, no obstruction of flow Tongue base retraction: Trace column of contrast or air between tongue base and PPW Pharyngeal residue: Trace residue within or on pharyngeal structures Location of pharyngeal residue: Pyriform sinuses  Esophageal Impairment Domain: Esophageal  Impairment Domain Esophageal clearance upright position: Complete clearance, esophageal coating Pill: Esophageal Impairment Domain Esophageal clearance upright position: Complete clearance, esophageal coating Penetration/Aspiration Scale Score: Penetration/Aspiration Scale Score 1.  Material does not enter airway: Thin liquids (Level 0); Mildly thick liquids (Level 2, nectar thick); Moderately thick liquids (Level 3, honey thick); Puree Compensatory Strategies: Compensatory Strategies Compensatory strategies: No   General Information: Caregiver present: No  Diet Prior to this Study: Full liquid diet   Temperature : Normal   Respiratory Status: WFL   Supplemental O2: None (Room air)   History of Recent Intubation: No  Behavior/Cognition: Alert; Cooperative; Requires cueing Self-Feeding Abilities: Dependent for feeding Baseline vocal quality/speech: Hypophonia/low volume Volitional Cough: Unable to elicit Volitional Swallow: Unable to elicit Exam Limitations: No limitations Goal Planning: Prognosis for improved oropharyngeal function: Fair Barriers to Reach Goals: Severity of deficits; Behavior; Cognitive deficits No data recorded Patient/Family Stated Goal: for pt to get better Consulted and agree with results and recommendations: Pt unable/family or caregiver not available Pain: Pain Assessment Pain Assessment: Faces Breathing: 0 Negative Vocalization: 0 Facial Expression: 0 Body Language: 0 Consolability: 0 PAINAD Score: 0 Pain Location: Drain site; generalized with movement Pain Descriptors / Indicators: Grimacing Pain Intervention(s): Limited activity within patient's tolerance; Monitored during session End of Session: Start Time:SLP Start Time (ACUTE ONLY): 1610 Stop Time: SLP Stop Time (ACUTE ONLY): 0855 Time Calculation:SLP Time Calculation (min) (ACUTE ONLY): 20 min Charges: SLP Evaluations $ SLP Speech Visit: 1 Visit SLP Evaluations $BSS Swallow: 1 Procedure $MBS Swallow: 1 Procedure $Swallowing Treatment: 1  Procedure SLP visit diagnosis: SLP Visit Diagnosis: Dysphagia, oral phase (R13.11) Past Medical History: Past Medical History: Diagnosis Date  Alzheimer disease 07/31/2018  Angina pectoris   Arthritis   Cardiomyopathy   COPD (chronic obstructive pulmonary disease)   Gastroesophageal reflux disease   History of colon polyps   Hyperlipidemia  Hypertension   Obesity   Osteoporosis   Polymyalgia rheumatica   Prostate cancer   Rheumatoid arthritis   Secondary Parkinson disease 08/13/2019  Sleep apnea  Past Surgical History: Past Surgical History: Procedure Laterality Date  CATARACT EXTRACTION    CORONARY ANGIOPLASTY    HEMORRHOIDECTOMY WITH HEMORRHOID BANDING    IR PERC CHOLECYSTOSTOMY  01/16/2023  PROSTATECTOMY    STRABISMUS SURGERY    TONSILLECTOMY   Angela Nevin, MA, CCC-SLP Speech Therapy   IR Perc Cholecystostomy  Result Date: 01/16/2023 CLINICAL DATA:  Acute calculus cholecystitis EXAM: PERCUTANEOUS CHOLECYSTOSTOMY TUBE PLACEMENT WITH ULTRASOUND AND FLUOROSCOPIC GUIDANCE FLUOROSCOPY: Radiation Exposure Index (as provided by the fluoroscopic device): 4 mGy air Kerma TECHNIQUE: Survey ultrasound of the abdomen was performed and an appropriate skin entry site was identified. Skin site was marked, prepped with chlorhexidine, and draped in usual sterile fashion, and infiltrated locally with 1% lidocaine. Intravenous Fentanyl and Versed 1mg  were administered as conscious sedation during continuous monitoring of the patient's level of consciousness and physiological / cardiorespiratory status by the radiology RN, with a total moderate sedation time of 10 minutes. Under real-time ultrasound guidance, gallbladder was accessed using a transhepatic approach with a 21-gauge needle. Ultrasound image documentation was saved. Bile returned through the hub. Needle was exchanged over a 018 guidewire for transitional dilator which allowed placement of 035 J wire. Over this, a 10.2 French pigtail catheter was advanced and  formed centrally in the gallbladder lumen. Small contrast injection confirmed appropriate position. Catheter secured externally with 0 Prolene suture and placed external drain bag. Patient tolerated the procedure well. COMPLICATIONS: COMPLICATIONS none IMPRESSION: 1. Technically successful percutaneous cholecystostomy tube placement with ultrasound and fluoroscopic guidance. Electronically Signed   By: Corlis Leak M.D.   On: 01/16/2023 19:09   CT Abdomen Pelvis W Contrast  Result Date: 01/15/2023 CLINICAL DATA:  Abdominal pain. EXAM: CT ABDOMEN AND PELVIS WITH CONTRAST TECHNIQUE: Multidetector CT imaging of the abdomen and pelvis was performed using the standard protocol following bolus administration of intravenous contrast. RADIATION DOSE REDUCTION: This exam was performed according to the departmental dose-optimization program which includes automated exposure control, adjustment of the mA and/or kV according to patient size and/or use of iterative reconstruction technique. CONTRAST:  80mL OMNIPAQUE IOHEXOL 300 MG/ML  SOLN COMPARISON:  CT abdomen pelvis dated 01/02/2023. FINDINGS: Lower chest: There are bibasilar subpleural atelectasis. There is coronary vascular calcification. No intra-abdominal free air. Upper abdominal and perihepatic ascites. Hepatobiliary: The liver is unremarkable. There is mild biliary dilatation versus periportal edema. The gallbladder is distended. Multiple stones noted within the gallbladder. There is a impacted appearing stone in the neck of the gallbladder. There is extensive inflammatory changes and edema surrounding the gallbladder. Findings most consistent with acute cholecystitis. Pancreas: There is mild inflammatory changes and stranding adjacent to the pancreas, likely related to inflammatory changes of the gallbladder. Acute pancreatitis is less likely. Correlation with pancreatic enzymes recommended. Spleen: Normal in size without focal abnormality. Adrenals/Urinary Tract:  The adrenal glands are unremarkable. There is no hydronephrosis on either side. There is symmetric enhancement and excretion of contrast by both kidneys. The visualized ureters and urinary bladder appear unremarkable. Stomach/Bowel: There is no bowel obstruction or active inflammation. The appendix is normal. Vascular/Lymphatic: Moderate aortoiliac atherosclerotic disease. The IVC is unremarkable. No portal venous gas. There is no adenopathy. Reproductive: Prostatectomy. Other: Midline vertical anterior pelvic wall incisional scar. Musculoskeletal: Degenerative changes of the spine. No acute osseous pathology. IMPRESSION: 1. Cholelithiasis with findings of acute  cholecystitis. 2. No bowel obstruction. Normal appendix. 3.  Aortic Atherosclerosis (ICD10-I70.0). Electronically Signed   By: Elgie Collard M.D.   On: 01/15/2023 22:49   DG Chest Port 1 View  Result Date: 01/15/2023 CLINICAL DATA:  Questionable sepsis-evaluate for abnormality. 4 hours of upper abdominal pain and nausea. EXAM: PORTABLE CHEST 1 VIEW COMPARISON:  Radiographs 01/02/2023 FINDINGS: Stable cardiomediastinal silhouette given patient rotation. Aortic atherosclerotic calcification. Low lung volumes accentuate pulmonary vascularity. Bibasilar/perihilar atelectasis, decreased from 01/02/2023. Gaseous distention of the stomach. No displaced rib fractures. IMPRESSION: Low lung volumes.  No active disease. Electronically Signed   By: Minerva Fester M.D.   On: 01/15/2023 21:55   US Abdomen Limited RUQ (LIVER/GB)  Result Date: 01/02/2023 CLINICAL DATA:  89 year old male with history of abdominal pain. EXAM: ULTRASOUND ABDOMEN LIMITED RIGHT UPPER QUADRANT COMPARISON:  CT of the abdomen and pelvis 01/02/2023. FINDINGS: Comment: Today's study is exceedingly limited secondary to poor sonographic windows (study is very limited by the presence of extensive bowel gas, presumably related to colonic interposition as demonstrated on prior CT examination).  Gallbladder: Poorly visualized. Common bile duct: Could not be definitively visualized. Liver: Poorly visualized. Other: None. IMPRESSION: 1. Poor quality essentially nondiagnostic study secondary to bowel gas interposed between the transducer in the liver/gallbladder. If there is clinical concern for acute cholecystitis, further evaluation with nuclear medicine hepatobiliary scan should be considered. Electronically Signed   By: Trudie Reed M.D.   On: 01/02/2023 06:58   CT Renal Stone Study  Result Date: 01/02/2023 CLINICAL DATA:  Abdominal and flank pain, fever, weakness and diarrhea. EXAM: CT ABDOMEN AND PELVIS WITHOUT CONTRAST TECHNIQUE: Multidetector CT imaging of the abdomen and pelvis was performed following the standard protocol without IV contrast. RADIATION DOSE REDUCTION: This exam was performed according to the departmental dose-optimization program which includes automated exposure control, adjustment of the mA and/or kV according to patient size and/or use of iterative reconstruction technique. COMPARISON:  None Available. FINDINGS: Lower chest: Respiratory motion artifact limits evaluation of the lung bases. There is diffuse bronchial thickening. There is asymmetric opacity in the hypoinflated left lower lobe base which could be atelectasis or pneumonia. No other focal lung base abnormality is seen. Calcifications are scattered along the aortic valve leaflets. There are three-vessel coronary artery calcifications heaviest in the LAD and mild cardiomegaly. No pericardial effusion. Descending aortic atherosclerosis. Hepatobiliary: Respiratory motion also reduces fine detail in the abdomen, but the gallbladder does appear abnormal. It is dilated to 12 cm with multiple stones and there is suspected at least mild wall thickening with pericholecystic edema. The findings may be exaggerated due to breathing motion but ultrasound follow-up is warranted. No abnormality of the unenhanced liver is seen  through the breathing motion. Pancreas: Partially atrophic. No other abnormality is seen through the breathing motion. Spleen: No abnormality is seen through the breathing motion. Adrenals/Urinary Tract: There is no adrenal mass. No focal abnormality in the unenhanced renal cortex. No urinary stones are visible through the breathing motion. There is no hydronephrosis or ureteral stone. The bladder thickness is normal. Stomach/Bowel: There is a near circumferential irregular wall prominence at the junction of the body and antrum of the stomach, with air-fluid distention of the more proximal stomach. This could be due to focal gastritis or infiltrating disease. Upper endoscopy is recommended. The unopacified small bowel and appendix are unremarkable. There is moderate fecal stasis ascending and proximal transverse colon. Scattered colonic diverticulosis greatest in the sigmoid. There are faint stranding changes alongside the mid  third of the descending colon, which could be due to colitis/diverticulitis or fat scarring from old diverticular disease. No other focal pericolic process is seen. Vascular/Lymphatic: Moderate to heavy aortoiliac calcific plaque. Fusiform infrarenal AAA measures 3.0 x 2.7 cm AP and transverse on 3:50, and extends for a length of 4 cm. No other vascular aneurysm is seen. Reproductive: Prior radical prostatectomy. Numerous surgical clips line the pelvic sidewalls and floor. No mass is seen in the prostate bed. Other: No abdominal wall hernia or abnormality. No abdominopelvic ascites. There is no free air or abscess. Musculoskeletal: Osteopenia with degenerative change thoracic and lumbar spine. Unilateral ankylosis right SI joint. No focal pathologic bone lesion. IMPRESSION: 1. Motion limited study. 2. Gallbladder dilatation with stones, suspected wall thickening and pericholecystic edema. Ultrasound follow-up is warranted. 3. Near circumferential irregular wall prominence at the junction of  the body and antrum of the stomach with air-fluid distention of the more proximal stomach. This could be due to focal gastritis or infiltrating disease. Upper endoscopy is recommended. 4. Faint stranding changes alongside the mid third of the descending colon which could be due to a mild colitis/diverticulitis or fat scarring from old diverticular disease. 5. Constipation and diverticulosis. 6. Aortic and coronary artery atherosclerosis. 7. 3.0 x 2.7 cm fusiform infrarenal AAA. Recommend follow-up every 3 years. 8. Bronchitis with asymmetric opacity in the hypoinflated left lower lobe base which could be atelectasis or pneumonia. 9. Prior radical prostatectomy. No mass is seen in the prostate bed. 10. Osteopenia and degenerative change. Aortic Atherosclerosis (ICD10-I70.0). Electronically Signed   By: Almira Bar M.D.   On: 01/02/2023 04:42   CT Head Wo Contrast  Result Date: 01/02/2023 CLINICAL DATA:  89 year old male with fever, weakness, diarrhea, altered mental status. EXAM: CT HEAD WITHOUT CONTRAST TECHNIQUE: Contiguous axial images were obtained from the base of the skull through the vertex without intravenous contrast. RADIATION DOSE REDUCTION: This exam was performed according to the departmental dose-optimization program which includes automated exposure control, adjustment of the mA and/or kV according to patient size and/or use of iterative reconstruction technique. COMPARISON:  Brain MRI 09/07/2017.  Head CT 02/16/2021. FINDINGS: Study is mildly degraded by motion artifact despite repeated imaging attempts. Brain: Stable cerebral volume since 2022. No midline shift, ventriculomegaly, mass effect, evidence of mass lesion, intracranial hemorrhage or evidence of cortically based acute infarction. Stable and normal for age gray-white matter differentiation throughout the brain. No cortical encephalomalacia identified. Vascular: Calcified atherosclerosis at the skull base. Skull: No acute osseous  abnormality identified. Sinuses/Orbits: Mild chronic left mastoid effusion is stable. Other visualized paranasal sinuses and mastoids are stable and well aerated. Tympanic cavities are clear. Other: No acute orbit or scalp soft tissue finding. IMPRESSION: 1. Mildly motion degraded, but negative for age non contrast CT appearance of the brain. 2. Mild chronic left mastoid effusion. Electronically Signed   By: Odessa Fleming M.D.   On: 01/02/2023 04:14   DG Chest Port 1 View  Result Date: 01/02/2023 CLINICAL DATA:  QUESTIONABLE SEPSIS WITH COUGH, FEVER, NAUSEA AND DIARRHEA. EXAM: PORTABLE CHEST 1 VIEW COMPARISON:  PORTABLE CHEST 05/08/2022 FINDINGS: Due to positioning the patient's chin and neck obscure the lung apices. The lungs are expiratory. The stomach is gas distended as before. Peribronchial cuffing continues to be noted with perihilar linear atelectasis. The visualized lungs are clear. The lower lung zones are obscured due to expiration. The sulci are sharp. The mediastinal configuration is stable. There is aortic atherosclerosis. Normal cardiac size and central vessels. There  is thoracic spondylosis and osteopenia. No acute osseous findings. Overlying monitor wires. IMPRESSION: 1. Limited study due to positioning and expiration. 2. Peribronchial cuffing with perihilar linear atelectasis. 3. No convincing new lung opacities. 4. The stomach gas distended as before. Electronically Signed   By: Almira Bar M.D.   On: 01/02/2023 03:23    Microbiology: Results for orders placed or performed during the hospital encounter of 01/15/23  Blood Culture (routine x 2)     Status: None   Collection Time: 01/15/23  9:55 PM   Specimen: BLOOD  Result Value Ref Range Status   Specimen Description   Final    BLOOD BLOOD RIGHT FOREARM Performed at [facility], [address], [place], [place] [address]    Special Requests   Final    BOTTLES DRAWN AEROBIC AND ANAEROBIC Blood Culture adequate  volume Performed at [facility], [address] [place], [place] [address]    Culture   Final    NO GROWTH 5 DAYS Performed at [facility], [address] [place], [place] [address]    Report Status 01/20/2023 FINAL  Final  Blood Culture (routine x 2)     Status: None   Collection Time: 01/15/23 10:00 PM   Specimen: BLOOD  Result Value Ref Range Status   Specimen Description   Final    BLOOD RIGHT ANTECUBITAL Performed at [facility], [address], [place], [place] [address]    Special Requests   Final    BOTTLES DRAWN AEROBIC AND ANAEROBIC Blood Culture adequate volume Performed at [facility], [address], [place], [place] [address]    Culture   Final    NO GROWTH 5 DAYS Performed at [facility], [address], [place], [place] [address]    Report Status 01/20/2023 FINAL  Final  Resp panel by RT-PCR (RSV, Flu A&B, Covid) Anterior Nasal Swab     Status: Abnormal   Collection Time: 01/15/23 10:17 PM   Specimen: Anterior Nasal Swab  Result Value Ref Range Status   SARS Coronavirus 2 by RT PCR POSITIVE (A) NEGATIVE Final    Comment: (NOTE) SARS-CoV-2 target nucleic acids are DETECTED.  The SARS-CoV-2 RNA is generally detectable in upper respiratory specimens during the acute phase of infection. Positive results are indicative of the presence of the identified virus, but do not rule out bacterial infection or co-infection with other pathogens not detected by the test. Clinical correlation with patient history and other diagnostic information is necessary to determine patient infection status. The expected result is Negative.  Fact Sheet for Patients: BloggerCourse.com  Fact Sheet for Healthcare Providers: SeriousBroker.it  This test is not yet approved or cleared by the [place] FDA and  has been authorized for detection and/or diagnosis  of SARS-CoV-2 by FDA under an Emergency Use Authorization (EUA).  This EUA will remain in effect (meaning this test can be used) for the duration of  the COVID-19 declaration under Section 564(b)(1) of the A ct, 21 U.S.C. section 360bbb-3(b)(1), unless the authorization is terminated or revoked sooner.     Influenza A by PCR NEGATIVE NEGATIVE Final   Influenza B by PCR NEGATIVE NEGATIVE Final    Comment: (NOTE) The Xpert Xpress SARS-CoV-2/FLU/RSV plus assay is intended as an aid in the diagnosis of influenza from Nasopharyngeal swab specimens and should not be used as a sole basis for treatment. Nasal washings and aspirates are unacceptable for Xpert Xpress  SARS-CoV-2/FLU/RSV testing.  Fact Sheet for Patients: BloggerCourse.com  Fact Sheet for Healthcare Providers: SeriousBroker.it  This test is not yet approved or cleared by the [place] FDA and has been authorized for detection and/or diagnosis of SARS-CoV-2 by FDA under an Emergency Use Authorization (EUA). This EUA will remain in effect (meaning this test can be used) for the duration of the COVID-19 declaration under Section 564(b)(1) of the Act, 21 U.S.C. section 360bbb-3(b)(1), unless the authorization is terminated or revoked.     Resp Syncytial Virus by PCR NEGATIVE NEGATIVE Final    Comment: (NOTE) Fact Sheet for Patients: BloggerCourse.com  Fact Sheet for Healthcare Providers: SeriousBroker.it  This test is not yet approved or cleared by the [place] FDA and has been authorized for detection and/or diagnosis of SARS-CoV-2 by FDA under an Emergency Use Authorization (EUA). This EUA will remain in effect (meaning this test can be used) for the duration of the COVID-19 declaration under Section 564(b)(1) of the Act, 21 U.S.C. section 360bbb-3(b)(1), unless the authorization is terminated  or revoked.  Performed at [facility], [address] [place], [place] [address]   Aerobic/Anaerobic Culture w Gram Stain (surgical/deep wound)     Status: None   Collection Time: 01/16/23  3:12 PM   Specimen: BILE  Result Value Ref Range Status   Specimen Description   Final    BILE Performed at [facility], [address] [place], [place] [address]    Special Requests   Final    NONE Performed at [facility], [address], [place], [place] [address]    Gram Stain NO WBC SEEN NO ORGANISMS SEEN   Final   Culture   Final    No growth aerobically or anaerobically. Performed at [facility], [address] [place], [place] [address]    Report Status 01/21/2023 FINAL  Final    Labs: CBC: Recent Labs  Lab 01/23/23 0457 01/27/23 1229  WBC 8.3 7.1  NEUTROABS 6.5  --   HGB 10.9* 11.9*  HCT 34.3* 37.4*  MCV 93.7 94.0  PLT 171 380   Basic Metabolic Panel: Recent Labs  Lab 01/23/23 0457 01/24/23 0512 01/27/23 1229  NA 137 141 140  K 3.4* 3.7 3.8  CL 105 109 109  CO2 26 27 24   GLUCOSE 109* 92 102*  BUN 19 18 30*  CREATININE 1.47* 1.40* 1.12  CALCIUM 7.8* 8.2* 8.1*  MG 2.0  --   --    Liver Function Tests: Recent Labs  Lab 01/23/23 0457 01/27/23 1229  AST 47* 38  ALT 95* 90*  ALKPHOS 84 99  BILITOT 0.8 0.7  PROT 5.2* 5.6*  ALBUMIN 2.3* 2.6*   CBG: Recent Labs  Lab 01/28/23 0734 01/28/23 1148 01/28/23 1653 01/28/23 2049 01/29/23 0729  GLUCAP 89 108* 105* 90 95    Discharge time spent: 35 minutes.  Signed: Jacquelin Hawking, MD [facility] 01/29/2023

## 2023-01-29 NOTE — Progress Notes (Signed)
Referring Physician(s): Phylliss Blakes   Supervising Physician: Gilmer Mor  Patient Status:  [facility] - In-pt  Chief Complaint:  RUQ pain, acute calculus cholecystitis  S/p perc chole placement by Dr. Deanne Coffer on 01/16/23.   Subjective:  Patient sitting in bed eating breakfast, family member at bedside.  Daughter states that she noticed some pink fluid on the perc chole dressing, thought it could be from his IV in the right arm that was oozing blood.  Perc chole dressing was changed by RN, concern that the drain might have pulled back a little.  RN notified IR above finding this morning as well.  Anticipating d/c today.    Allergies: Lorazepam  Medications: Prior to Admission medications   Medication Sig Start Date End Date Taking? Authorizing Provider  aspirin EC 81 MG tablet Take 81 mg by mouth at bedtime.   Yes [provider]  Cholecalciferol (VITAMIN D-3 PO) Take 1,000 Units by mouth daily.   Yes [provider]  Cyanocobalamin (VITAMIN B12) 1000 MCG TBCR Take 2,500 mcg by mouth daily.   Yes [provider]  Fluticasone Propionate (FLONASE ALLERGY RELIEF NA) Place 2 sprays into both nostrils daily as needed (Rhinitis).   Yes [provider]  guaiFENesin (MUCINEX) 600 MG 12 hr tablet Take 1 tablet (600 mg total) by mouth 2 (two) times daily. 01/05/23  Yes Regalado, Belkys A, MD  hydroxychloroquine (PLAQUENIL) 200 MG tablet Take 200 mg by mouth See admin instructions. Take 200 mg (1 tablet) every other day, alternating with 400 mg (2 tablets) every other day. 06/11/19  Yes [provider]  ipratropium-albuterol (DUONEB) 0.5-2.5 (3) MG/3ML SOLN Take 3 mLs by nebulization 2 (two) times daily. 01/05/23  Yes Regalado, Belkys A, MD  Lactobacillus (PROBIOTIC ACIDOPHILUS PO) Take 1 tablet by mouth daily.   Yes [provider]  levothyroxine (SYNTHROID) 25 MCG tablet Take 25 mcg by mouth daily before breakfast. 04/29/20  Yes [provider]  memantine (NAMENDA) 10 MG tablet Take 1 tablet (10 mg total) by mouth 2 (two) times daily. 05/31/22  Yes Anson Fret, MD  metoprolol succinate (TOPROL-XL) 25 MG 24 hr tablet Take 12.5 mg by mouth daily. 08/20/16 01/16/23 Yes [provider]  modafinil (PROVIGIL) 100 MG tablet Take 1 tablet (100 mg total) by mouth daily. 12/05/22  Yes Anson Fret, MD  Multiple Vitamin (MULTIVITAMIN) capsule Take 1 capsule by mouth daily.   Yes [provider]  pantoprazole (PROTONIX) 40 MG tablet Take 1 tablet (40 mg total) by mouth daily. 01/05/23  Yes Regalado, Belkys A, MD  QUEtiapine (SEROQUEL) 25 MG tablet Take 1 tablet (25 mg total) by mouth at bedtime. 05/31/22  Yes Anson Fret, MD  Sodium Chloride Flush (NORMAL SALINE FLUSH) 0.9 % SOLN Inject 10 mLs into the vein daily. 01/18/23  Yes Alene Mires, NP     Vital Signs: BP (!) 145/64   Pulse 74   Temp 98.1 F (36.7 C) (Oral)   Resp 18   Ht  (1.778 m)   Wt 194 lb 3.6 oz (88.1 kg)   SpO2 95%   BMI 27.87 kg/m   Physical Exam Vitals reviewed.  Constitutional:      General: He is not in acute distress. HENT:     Head: Normocephalic.  Pulmonary:     Effort: Pulmonary effort is normal.  Abdominal:     General: Abdomen is flat.     Palpations: Abdomen is soft.  Comments: Positive RUQ drain to a gravity bag. Site is unremarkable with no erythema, edema, tenderness, bleeding or drainage. Suture and stat lock in place. Dressing is clean, dry, and intact. Bile colored fluid noted in the bag. Drain aspirates and flushes well, no leak with flushing.    Neurological:     Mental Status: He is alert.     Imaging: No results found.  Labs:  CBC: Recent Labs    01/21/23 0425 01/22/23 0522 01/23/23 0457 01/27/23 1229  WBC 7.5 8.8 8.3 7.1  HGB 10.4* 11.4* 10.9* 11.9*  HCT 33.0* 35.5* 34.3* 37.4*  PLT 131* 147* 171 380    COAGS: Recent Labs    01/02/23 0256 01/15/23 2108  INR 1.1  1.1  APTT 28 26    BMP: Recent Labs    01/22/23 0522 01/23/23 0457 01/24/23 0512 01/27/23 1229  NA 139 137 141 140  K 3.0* 3.4* 3.7 3.8  CL 106 105 109 109  CO2 27 26 27 24   GLUCOSE 112* 109* 92 102*  BUN 17 19 18  30*  CALCIUM 8.0* 7.8* 8.2* 8.1*  CREATININE 1.32* 1.47* 1.40* 1.12  GFRNONAA 50* 44* 47* >60    LIVER FUNCTION TESTS: Recent Labs    01/21/23 0425 01/22/23 0522 01/23/23 0457 01/27/23 1229  BILITOT 1.6* 1.2 0.8 0.7  AST 29 61* 47* 38  ALT 50* 97* 95* 90*  ALKPHOS 94 99 84 99  PROT 5.1* 5.4* 5.2* 5.6*  ALBUMIN 2.2* 2.3* 2.3* 2.6*    Assessment and Plan:  89 y.o. male with Alzheimer's dementia, CAD, CKD stage IIIb, COPD, rheumatoid arthritis, hypertension, hyperlipidemia, prostate cancer, OSA not on CPAP who presented to  ED on 4/2, found to be in severe sepsis secondary to acute calculus cholecystitis, s/p perc chole placement by Dr. Deanne Coffer on 01/16/23. Per general surgery, patient is not a candidate for sx and the perc chole drain will be definitive treatment for his cholecystitis - general surgery signed off on 01/18/23.   Patient is anticipating discharge today.  Perc chole intact, flushes and aspirates well.   Drain Location: RUQ Size: Fr size: 10 Fr Date of placement: 4/3  Currently to: Drain collection device: gravity 24 hour output:  Output by Drain (mL) 01/27/23 0701 - 01/27/23 1900 01/27/23 1901 - 01/28/23 0700 01/28/23 0701 - 01/28/23 1900 01/28/23 1901 - 01/29/23 0700 01/29/23 0701 - 01/29/23 1033  Biliary Tube Cook slip-coat 10.2 Fr. RUQ 90 90 60 150     Interval imaging/drain manipulation:  None   Current examination: Flushes/aspirates easily.  Insertion site unremarkable. Suture and stat lock in place. Dressed appropriately.   Plan: Continue TID flushes with 5 cc NS. Record output Q shift. Dressing changes QD or PRN if soiled.  Call IR APP or on call IR MD if difficulty flushing or sudden change in drain output.  Repeat  imaging/possible drain injection once output < 10 mL/QD (excluding flush material). Consideration for drain removal if output is < 10 mL/QD (excluding flush material), pending discussion with the providing surgical service.  Discharge planning: Perc chole drain check in 6 - 8 week from the time of the placement, order placed.  Patient will receive call call from IR schedulers to set up the appointment- currently scheduled on 02/16/22 1:30 pm at [facility].  Drain must be flushed with 5-10 mL NS once a day, measure output daily, dressing changes as needed.  Daughter was encouraged to reach out to IR if output suddenly changes, or the drain  does not flush.    Further treatment plan per primary team/palliative team  Appreciate and agree with the plan.  IR to follow.    Electronically Signed: Willette Brace, PA-C 01/29/2023, 10:24 AM   I spent a total of 15 Minutes at the the patient's bedside AND on the patient's hospital floor or unit, greater than 50% of which was counseling/coordinating care for perc chole f/u.   This chart was dictated using voice recognition software.  Despite best efforts to proofread,  errors can occur which can change the documentation meaning.

## 2023-01-29 NOTE — Progress Notes (Signed)
Report called to [facility] and patient discharged to facility via ambulance.

## 2023-01-29 NOTE — Progress Notes (Signed)
Daily Progress Note   Patient Name: Devin Vance       Date: 01/29/2023 DOB: August 08, 1928  Age: 89 y.o. MRN#: [id number] Attending Physician: Narda Bonds, MD Primary Care Physician: Georgann Housekeeper, MD Admit Date: 01/15/2023  Reason for Consultation/Follow-up: Establishing goals of care  Subjective:  Patient is resting in bed, he is participating more with PT OT attempts,oral intake is better at times as well, daughter at bedside.     Length of Stay: 14  Current Medications: Scheduled Meds:   amoxicillin-clavulanate  1 tablet Oral Q12H   insulin aspart  0-6 Units Subcutaneous TID WC   levothyroxine  25 mcg Oral Q0600   melatonin  3 mg Oral QHS   memantine  10 mg Oral BID   metoprolol succinate  12.5 mg Oral Daily   mouth rinse  15 mL Mouth Rinse 4 times per day   senna-docusate  1 tablet Oral BID   sodium chloride flush  3 mL Intravenous Q12H   sodium chloride flush  5 mL Intracatheter Q8H    Continuous Infusions:  sodium chloride 100 mL/hr at 01/29/23 0703    PRN Meds: acetaminophen **OR** acetaminophen, HYDROcodone-acetaminophen, ipratropium-albuterol, ondansetron **OR** ondansetron (ZOFRAN) IV, mouth rinse, mouth rinse, polyethylene glycol  Physical Exam         Awakens and ambulates and eats when awake and alert.  No distress regular work of breathing    Vital Signs: BP (!) 145/64   Pulse 74   Temp 98.1 F (36.7 C) (Oral)   Resp 18   Ht  (1.778 m)   Wt 88.1 kg   SpO2 95%   BMI 27.87 kg/m  SpO2: SpO2: 95 % O2 Device: O2 Device: Room Air O2 Flow Rate: O2 Flow Rate (L/min): 3 L/min  Intake/output summary:  Intake/Output Summary (Last 24 hours) at 01/29/2023 1024 Last data filed at 01/29/2023 0900 Gross per 24 hour  Intake 2177.93 ml  Output 705 ml   Net 1472.93 ml    LBM: Last BM Date : 01/25/23 Baseline Weight: Weight: 90.7 kg Most recent weight: Weight: 88.1 kg       Palliative Assessment/Data:      Patient Active Problem List   Diagnosis Date Noted   Acute calculous cholecystitis 01/16/2023   Hyperglycemia 01/16/2023   Coronary artery disease 01/16/2023  Hypothyroidism 01/16/2023   Prostate cancer 01/15/2023   Polymyalgia rheumatica 01/15/2023   Severe sepsis with lactic acidosis 01/15/2023   Lab test positive for detection of COVID-19 virus 01/02/2023   AAA (abdominal aortic aneurysm) 01/02/2023   Dilation of stomach 01/02/2023   Chronic diarrhea 01/02/2023   Alzheimer's dementia with agitation 06/02/2022   AMS (altered mental status) 02/16/2021   Gait abnormality 07/15/2019   Cardiomyopathy 03/24/2019   COPD (chronic obstructive pulmonary disease) 03/24/2019   HLD (hyperlipidemia) 03/24/2019   Acute renal failure superimposed on stage 3 chronic kidney disease 10/28/2018   Chronic kidney disease, stage 3b 10/28/2018   Rheumatoid arthritis involving multiple sites 10/28/2018   Dementia with behavioral disturbance 10/27/2018   Alzheimer's dementia with behavioral disturbance 07/31/2018   Essential hypertension 08/15/2017   Anemia, mild 02/12/2017   Olecranon bursitis of right elbow 02/12/2017   Kyphosis of cervical region 10/26/2015   Intermittent alternating esotropia 05/18/2015   GERD (gastroesophageal reflux disease) 01/22/2014   Angina pectoris 01/22/2014    Palliative Care Assessment & Plan   Patient Profile:    Assessment:  Devin Vance is a 89 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 He has a history of Alzheimer's dementia for the past 5 to 6 years.  He lives at [facility] independent living facility.  He has a recent hospitalization for COVID.  He has been admitted to hospital medicine service with the surgical colleagues and vaginal  radiology colleagues following for cholecystitis for which he underwent percutaneous cholecystostomy tube placement on 01-16-2023.  Hospital course complicated by altered mental status and patient not awake alert enough to have a meaningful oral intake of clear liquids which has been prescribed. Palliative consult for ongoing goals of care discussions has been requested. Chart reviewed, patient seen and examined, discussed with family present at bedside.  Patient has past medical history of Alzheimer's dementia obstructive sleep apnea stage III chronic kidney disease hypertension GERD coronary artery disease and history of prostate cancer. He has a had the cholecystostomy tube placement, he has had gradual progressive functional and cognitive decline from a dementia standpoint even prior to this gallbladder episode and prior to recent COVID hospitalization as well.  Generally at baseline, he is wheelchair-bound and only gives one-word answers.    Recommendations/Plan:  [facility] rehab on discharge, discussed with [facility] liaison : outpatient palliative support, discussed with daughter at bedside.      Code Status:    Code Status Orders  (From admission, onward)           Start     Ordered   01/16/23 0019  Do not attempt resuscitation (DNR)  Continuous       Question Answer Comment  If patient has no pulse and is not breathing Do Not Attempt Resuscitation   If patient has a pulse and/or is breathing: Medical Treatment Goals LIMITED ADDITIONAL INTERVENTIONS: Use medication/IV fluids and cardiac monitoring as indicated; Do not use intubation or mechanical ventilation (DNI), also provide comfort medications.  Transfer to Progressive/Stepdown as indicated, avoid Intensive Care.   Consent: Discussion documented in EHR or advanced directives reviewed      01/16/23 0020           Code Status History     Date Active Date Inactive Code Status Order ID Comments User Context   01/02/2023 0818  01/05/2023 1848 Full Code [id number]  Bobette Mo, MD ED  Prognosis:  Guarded   Discharge Planning:  [facility] with PT and palliative services is currently being considered.   Care plan was discussed with  family.   Thank you for allowing the Palliative Medicine Team to assist in the care of this patient. mod MDM.      Greater than 50%  of this time was spent counseling and coordinating care related to the above assessment and plan.  Rosalin Hawking, MD  Please contact Palliative Medicine Team phone at [phone] for questions and concerns.

## 2023-01-29 NOTE — Progress Notes (Signed)
Patient more alert and at this time eating yogurt and also Patient took Medications with applesuace

## 2023-01-29 NOTE — TOC Progression Note (Signed)
Transition of Care [facility]) - Progression Note    Patient Details  Name: TOI DOVELL MRN: [id number] Date of Birth: 1927/11/24  Transition of Care [facility]) CM/SW Contact  Geni Bers, RN Phone Number: 01/29/2023, 11:51 AM  Clinical Narrative:     Sharin Mons was called. [facility] is okay with [facility].   Expected Discharge Plan: Skilled Nursing Facility Barriers to Discharge: Other (must enter comment) (SNF bed available Monday 01/28/23)  Expected Discharge Plan and Services   Discharge Planning Services: CM Consult   Living arrangements for the past 2 months: Independent Living Facility [facility]) Expected Discharge Date: 01/29/23                                     Social Determinants of Health (SDOH) Interventions SDOH Screenings   Food Insecurity: No Food Insecurity (01/16/2023)  Housing: Low Risk  (01/19/2023)  Transportation Needs: No Transportation Needs (01/19/2023)  Utilities: Not At Risk (01/19/2023)  Tobacco Use: Low Risk  (01/19/2023)    Readmission Risk Interventions    01/22/2023   11:25 AM 01/16/2023    2:27 PM  Readmission Risk Prevention Plan  Transportation Screening Complete Complete  PCP or Specialist Appt within 3-5 Days Complete Complete  HRI or Home Care Consult Complete Complete  Social Work Consult for Recovery Care Planning/Counseling Complete Complete  Palliative Care Screening Complete Complete  Medication Review [employer]) Complete Complete

## 2023-01-30 DIAGNOSIS — R1311 Dysphagia, oral phase: Secondary | ICD-10-CM

## 2023-01-31 DIAGNOSIS — L89326 Pressure-induced deep tissue damage of left buttock: Secondary | ICD-10-CM | POA: Diagnosis not present

## 2023-01-31 DIAGNOSIS — I10 Essential (primary) hypertension: Secondary | ICD-10-CM | POA: Diagnosis not present

## 2023-01-31 DIAGNOSIS — C61 Malignant neoplasm of prostate: Secondary | ICD-10-CM | POA: Diagnosis not present

## 2023-01-31 DIAGNOSIS — A419 Sepsis, unspecified organism: Secondary | ICD-10-CM | POA: Diagnosis not present

## 2023-02-05 ENCOUNTER — Non-Acute Institutional Stay: Payer: Self-pay | Admitting: Family Medicine

## 2023-02-05 VITALS — BP 104/68 | HR 78 | Temp 97.8°F | Resp 16

## 2023-02-05 DIAGNOSIS — E440 Moderate protein-calorie malnutrition: Secondary | ICD-10-CM

## 2023-02-05 DIAGNOSIS — K8000 Calculus of gallbladder with acute cholecystitis without obstruction: Secondary | ICD-10-CM

## 2023-02-05 DIAGNOSIS — R531 Weakness: Secondary | ICD-10-CM

## 2023-02-05 DIAGNOSIS — G301 Alzheimer's disease with late onset: Secondary | ICD-10-CM

## 2023-02-05 DIAGNOSIS — L8946 Pressure-induced deep tissue damage of contiguous site of back, buttock and hip: Secondary | ICD-10-CM

## 2023-02-05 DIAGNOSIS — Z7189 Other specified counseling: Secondary | ICD-10-CM

## 2023-02-05 NOTE — Progress Notes (Signed)
Therapist, nutritional Palliative Care Consult Note Telephone: [phone]  Fax: [phone]   Date of encounter: 02/05/23 2:27 PM PATIENT NAME: Devin Vance [address] [place] [address]   [phone] (home)  DOB: [birth year] MRN: [id number] PRIMARY CARE PROVIDER:    Georgann Housekeeper, MD,  [address] [place] [address] [phone]  REFERRING PROVIDER:   Georgann Housekeeper, MD [address] [place],  [place] [address] [phone]  Emergency Contact:    Contact Information     Name Relation Home Work Mobile   Devin Vance Spouse [phone]  [phone]   Devin Vance Daughter [phone]  [phone]   Devin Vance, Devin Vance [phone]   Devin Vance [phone]  [phone]   Devin Vance, Devin Vance   [phone]       Health Care POA/Health Care Agent   I met face to face with patient in [facility]. Palliative Care was asked to follow this patient by consultation request of Devin Housekeeper, MD to address advance care planning and complex medical decision making. This is an initial visit.  Assessment/Plan: Counseling regarding advance care planning and goals of care:  ADVANCE CARE PLANNING/GOALS OF CARE: Health care surrogate gave her permission to discuss. Wife Devin Vance is [facility] POA but is currently ill herself with Pneumonia following Covid infection.  PRESENT FOR DISCUSSION: daughter Devin Vance, patient and Devin Man FNP-C  GOALS: Maintain comfort and quality of life, improve health as much as possible without heroic measures.  BARRIERS: Wife who is HC POA is ill currently and unable to be present   Our advance care planning conversation included a discussion about:    The value and importance of advance care planning-MOST Experiences with loved ones who have been seriously ill or have died-pt's son died in October 30, 2022 in house fire Exploration of personal, cultural or  spiritual beliefs that might influence medical decisions  Review of an advance directive document-MOST. Daughter states pt is DNR, did not want intubation or feeding tube   CODE STATUS: DNR  I spent 18 minutes providing this consultation with more than 50% of the time spent on counseling patient and coordinating communication with family, chart review and documentation. --------------------------------------------------------------------------------------------------------------------------------------------------------------------------------------------  ASSESSMENT AND / RECOMMENDATIONS:  PPS: 30%  Acute calculous cholecystitis Continue T tube with daily irrigation, empty and recharge every shift. Monitor for fever, increased pain. Recommend CMP in 1 to 2 weeks to follow-up LFTs. Complete Augmentin as prescribed.   Pressure injury of deep tissue of contiguous region involving back and buttock Continue low-air-loss pressure mattress. Recommend use of barrier protective cream with zinc with every incontinence change and twice daily. Recommend frequent position change while awake. Apply Mepilex dressing over deep tissue injury areas to provide increased cushioning. Follow-up per wound care eval and treat.   Severe late onset Alzheimers dementia with agitation Fast 7 score 6E Continue Namenda 10 mg twice daily and Seroquel 25 mg at bedtime. Continue to reorient. Recommend ST eval and treat for aids for memory improvement   Generalized weakness Recommend PT OT eval and treat  5.    Moderate protein calorie malnutrition Last albumin had improved slightly from 2.3-2.6. Encourage increased intake of protein and while having difficulty swallowing, would recommend smoothies with protein supplement or shakes.   Follow up Palliative Care Visit:  Palliative Care continuing to follow up by monitoring for changes in appetite, weight, functional and cognitive status for chronic disease  progression and management in agreement with patient's stated goals  of care. Next visit in 1-2 weeks or prn.  This visit was coded based on medical decision making (MDM).  Chief Complaint  Palliative Care received a consult to follow up patient for chronic medical management in setting of Alzheimers dementia with back to back hospitalizations for Covid 19 and sepsis due to inflamed gallbladder.  Palliative Care is also following to assist with advance care planning and goals of care.  HISTORY OF PRESENT ILLNESS: Devin Vance is a 89 y.o. year old male with Alzheimer's dementia.  He developed Covid 19 on March 19th and he was treated with Paxlovid.  Prior to admission patient was living with his wife in independent living and was able to ambulate with rolling walker, had to have help with cleaning from toileting and could feed himself but had to have set up.  He was home for a week from hospitalization for COVID then began to go downhill again. He was noted to have sepsis from an inflamed gallbladder with gallstones but was unable to be strong enough to have it surgically removed.  Interventional radiology placed a t-tube and he was treated with IV antibiotics and sent to the facility to complete his antibiotics with Augmentin through 02/07/23 and improve his strength.  His wife was his primary caregiver until she also developed Covid about a week after he did.  Currently she has pneumonia. He will eat and feed himself now when awake.  If not awake he will pocket. He has a low pressure air mattress on the bed.  He has RA mainly in the right hand. Most of the times he has 50% of 1-2 meals.  Has ice creams that are protein fortified and he has the family bringing in Ensure.     ACTIVITIES OF DAILY LIVING: CONTINENT OF BLADDER/BOWEL? No BATHING/DRESSING/FEEDING: assist for all except feeding himself and set up with current cueing to swallow  MOBILITY:    Currently non-ambulatory  APPETITE? fair WEIGHT:  194 pounds 3.6 ounces as of 01/31/2023  CURRENT PROBLEM LIST:  Patient Active Problem List   Diagnosis Date Noted   Acute calculous cholecystitis 01/16/2023   Hyperglycemia 01/16/2023   Coronary artery disease 01/16/2023   Hypothyroidism 01/16/2023   Prostate cancer 01/15/2023   Polymyalgia rheumatica 01/15/2023   Severe sepsis with lactic acidosis 01/15/2023   Lab test positive for detection of COVID-19 virus 01/02/2023   AAA (abdominal aortic aneurysm) 01/02/2023   Dilation of stomach 01/02/2023   Chronic diarrhea 01/02/2023   Alzheimer's dementia with agitation 06/02/2022   AMS (altered mental status) 02/16/2021   Gait abnormality 07/15/2019   Cardiomyopathy 03/24/2019   COPD (chronic obstructive pulmonary disease) 03/24/2019   HLD (hyperlipidemia) 03/24/2019   Acute renal failure superimposed on stage 3 chronic kidney disease 10/28/2018   Chronic kidney disease, stage 3b 10/28/2018   Rheumatoid arthritis involving multiple sites 10/28/2018   Dementia with behavioral disturbance 10/27/2018   Alzheimer's dementia with behavioral disturbance 07/31/2018   Essential hypertension 08/15/2017   Anemia, mild 02/12/2017   Olecranon bursitis of right elbow 02/12/2017   Kyphosis of cervical region 10/26/2015   Intermittent alternating esotropia 05/18/2015   GERD (gastroesophageal reflux disease) 01/22/2014   Angina pectoris 01/22/2014   PAST MEDICAL HISTORY:  Active Ambulatory Problems    Diagnosis Date Noted   Kyphosis of cervical region 10/26/2015   Intermittent alternating esotropia 05/18/2015   Essential hypertension 08/15/2017   GERD (gastroesophageal reflux disease) 01/22/2014   Angina pectoris 01/22/2014   Alzheimer's dementia with behavioral disturbance 07/31/2018  Acute renal failure superimposed on stage 3 chronic kidney disease 10/28/2018   Anemia, mild 02/12/2017   Cardiomyopathy 03/24/2019   Chronic kidney disease, stage 3b 10/28/2018   COPD (chronic obstructive  pulmonary disease) 03/24/2019   Dementia with behavioral disturbance 10/27/2018   HLD (hyperlipidemia) 03/24/2019   Olecranon bursitis of right elbow 02/12/2017   Rheumatoid arthritis involving multiple sites 10/28/2018   Gait abnormality 07/15/2019   AMS (altered mental status) 02/16/2021   Alzheimer's dementia with agitation 06/02/2022   Lab test positive for detection of COVID-19 virus 01/02/2023   AAA (abdominal aortic aneurysm) 01/02/2023   Dilation of stomach 01/02/2023   Chronic diarrhea 01/02/2023   Prostate cancer 01/15/2023   Polymyalgia rheumatica 01/15/2023   Severe sepsis with lactic acidosis 01/15/2023   Acute calculous cholecystitis 01/16/2023   Hyperglycemia 01/16/2023   Coronary artery disease 01/16/2023   Hypothyroidism 01/16/2023   Resolved Ambulatory Problems    Diagnosis Date Noted   Secondary Parkinson disease 08/13/2019   Past Medical History:  Diagnosis Date   Alzheimer disease 07/31/2018   Arthritis    Gastroesophageal reflux disease    History of colon polyps    Hyperlipidemia    Hypertension    Obesity    Osteoporosis    Rheumatoid arthritis    Sleep apnea    SOCIAL HX:  Social History   Tobacco Use   Smoking status: Never   Smokeless tobacco: Never  Substance Use Topics   Alcohol use: Not Currently   FAMILY HX:  Family History  Problem Relation Age of Onset   Stroke Mother    Dementia Mother    Cancer Father    Dementia Sister    Dementia Maternal Aunt        Preferred Pharmacy: ALLERGIES:  Allergies  Allergen Reactions   Lorazepam Other (See Comments)    Generalized weakness     PERTINENT MEDICATIONS:  Outpatient Encounter Medications as of 02/05/2023  Medication Sig   amoxicillin-clavulanate (AUGMENTIN) 875-125 MG tablet Take 1 tablet by mouth every 12 (twelve) hours for 10 days.   aspirin EC 81 MG tablet Take 81 mg by mouth at bedtime.   Cholecalciferol (VITAMIN D-3 PO) Take 1,000 Units by mouth daily.    Cyanocobalamin (VITAMIN B12) 1000 MCG TBCR Take 2,500 mcg by mouth daily.   Fluticasone Propionate (FLONASE ALLERGY RELIEF NA) Place 2 sprays into both nostrils daily as needed (Rhinitis).   guaiFENesin (MUCINEX) 600 MG 12 hr tablet Take 1 tablet (600 mg total) by mouth 2 (two) times daily.   hydroxychloroquine (PLAQUENIL) 200 MG tablet Take 200 mg by mouth See admin instructions. Take 200 mg (1 tablet) every other day, alternating with 400 mg (2 tablets) every other day.   ipratropium-albuterol (DUONEB) 0.5-2.5 (3) MG/3ML SOLN Take 3 mLs by nebulization 2 (two) times daily.   Lactobacillus (PROBIOTIC ACIDOPHILUS PO) Take 1 tablet by mouth daily.   levothyroxine (SYNTHROID) 25 MCG tablet Take 25 mcg by mouth daily before breakfast.   melatonin 3 MG TABS tablet Take 1 tablet (3 mg total) by mouth at bedtime as needed.   memantine (NAMENDA) 10 MG tablet Take 1 tablet (10 mg total) by mouth 2 (two) times daily.   metoprolol succinate (TOPROL-XL) 25 MG 24 hr tablet Take 12.5 mg by mouth daily.   modafinil (PROVIGIL) 100 MG tablet Take 1 tablet (100 mg total) by mouth daily.   Multiple Vitamin (MULTIVITAMIN) capsule Take 1 capsule by mouth daily.   pantoprazole (PROTONIX) 40 MG tablet Take  1 tablet (40 mg total) by mouth daily.   QUEtiapine (SEROQUEL) 25 MG tablet Take 1 tablet (25 mg total) by mouth at bedtime.   Sodium Chloride Flush (NORMAL SALINE FLUSH) 0.9 % SOLN Inject 10 mLs into the vein daily.   No facility-administered encounter medications on file as of 02/05/2023.    History obtained from review of EMR, discussion with family, facility staff/caregiver and/or patient.   CBC    Component Value Date/Time   WBC 7.1 01/27/2023 1229   RBC 3.98 (L) 01/27/2023 1229   HGB 11.9 (L) 01/27/2023 1229   HGB 12.1 (L) 07/15/2019 1407   HCT 37.4 (L) 01/27/2023 1229   HCT 35.5 (L) 07/15/2019 1407   PLT 380 01/27/2023 1229   PLT 192 07/15/2019 1407   MCV 94.0 01/27/2023 1229   MCV 94 07/15/2019  1407   MCV 91 04/09/2014 1759   MCH 29.9 01/27/2023 1229   MCHC 31.8 01/27/2023 1229   RDW 14.0 01/27/2023 1229   RDW 12.4 07/15/2019 1407   RDW 13.5 04/09/2014 1759   LYMPHSABS 1.0 01/23/2023 0457   LYMPHSABS 1.7 07/15/2019 1407   LYMPHSABS 1.4 03/22/2014 0407   MONOABS 0.3 01/23/2023 0457   MONOABS 0.5 03/22/2014 0407   EOSABS 0.4 01/23/2023 0457   EOSABS 0.2 07/15/2019 1407   EOSABS 0.2 03/22/2014 0407   BASOSABS 0.0 01/23/2023 0457   BASOSABS 0.1 07/15/2019 1407   BASOSABS 0.1 03/22/2014 0407    CMP    Latest Ref Rng & Units 01/27/2023   12:29 PM 01/24/2023    5:12 AM 01/23/2023    4:57 AM  CMP  Glucose 70 - 99 mg/dL 161  92  096   BUN 8 - 23 mg/dL 30  18  19    Creatinine 0.61 - 1.24 mg/dL 0.45  4.09  8.11   Sodium 135 - 145 mmol/L 140  141  137   Potassium 3.5 - 5.1 mmol/L 3.8  3.7  3.4   Chloride 98 - 111 mmol/L 109  109  105   CO2 22 - 32 mmol/L 24  27  26    Calcium 8.9 - 10.3 mg/dL 8.1  8.2  7.8   Total Protein 6.5 - 8.1 g/dL 5.6   5.2   Total Bilirubin 0.3 - 1.2 mg/dL 0.7   0.8   Alkaline Phos 38 - 126 U/L 99   84   AST 15 - 41 U/L 38   47   ALT 0 - 44 U/L 90   95     LFTs    Latest Ref Rng & Units 01/27/2023   12:29 PM 01/23/2023    4:57 AM 01/22/2023    5:22 AM  Hepatic Function  Total Protein 6.5 - 8.1 g/dL 5.6  5.2  5.4   Albumin 3.5 - 5.0 g/dL 2.6  2.3  2.3   AST 15 - 41 U/L 38  47  61   ALT 0 - 44 U/L 90  95  97   Alk Phosphatase 38 - 126 U/L 99  84  99   Total Bilirubin 0.3 - 1.2 mg/dL 0.7  0.8  1.2     Urinalysis    Component Value Date/Time   COLORURINE YELLOW 01/28/2023 1400   APPEARANCEUR HAZY (A) 01/28/2023 1400   APPEARANCEUR Clear 07/16/2019 1206   LABSPEC 1.016 01/28/2023 1400   PHURINE 5.0 01/28/2023 1400   GLUCOSEU NEGATIVE 01/28/2023 1400   HGBUR NEGATIVE 01/28/2023 1400   BILIRUBINUR NEGATIVE 01/28/2023 1400  BILIRUBINUR Negative 07/16/2019 1206   KETONESUR NEGATIVE 01/28/2023 1400   PROTEINUR NEGATIVE 01/28/2023 1400    NITRITE NEGATIVE 01/28/2023 1400   LEUKOCYTESUR NEGATIVE 01/28/2023 1400       I reviewed available labs, medications, imaging, studies and related documents from the EMR.  Records reviewed and summarized above.   Physical Exam: GENERAL: NAD, grimaces with movement LUNGS: CTAB but diminished, no increased work of breathing, room air CARDIAC:  S1S2, RRR with no MRG, No edema/cyanosis.  Feet/hands warm ABD:  Normo-active BS x 4 quads, soft, mildly generally tender across upper abdomen.  T tube intact to bulb suction draining dark Devin bilious liquid EXTREMITIES: Normal ROM, no deformity, strength equal, No muscle atrophy/subcutaneous fat loss SKIN: Skin of gluteal fold with deep purple discoloration perianal bilaterally over pelvic inferior ischium areas, left worse than right surrounding skin of gluteal fold erythematous extending into intertriginous areas of legs.  Has small fissure of left lateral chest wall inferior to breast with mild pinkish erythema NEURO:  Noted generalized weakness with cognitive impairment PSYCH:  non-anxious affect, lethargic, Arousable, mumbling and soft spoken, oriented x 1   Thank you for the opportunity to participate in the care of Devin Vance. Please call our main office at [phone] if we can be of additional assistance.    Devin Man FNP-C  [email or web address] .[facility]  Phone:  [phone]

## 2023-02-07 ENCOUNTER — Encounter: Payer: Self-pay | Admitting: Family Medicine

## 2023-02-07 DIAGNOSIS — I10 Essential (primary) hypertension: Secondary | ICD-10-CM | POA: Diagnosis not present

## 2023-02-07 DIAGNOSIS — R531 Weakness: Secondary | ICD-10-CM | POA: Insufficient documentation

## 2023-02-07 DIAGNOSIS — E440 Moderate protein-calorie malnutrition: Secondary | ICD-10-CM | POA: Insufficient documentation

## 2023-02-07 DIAGNOSIS — Z7189 Other specified counseling: Secondary | ICD-10-CM | POA: Insufficient documentation

## 2023-02-07 DIAGNOSIS — C61 Malignant neoplasm of prostate: Secondary | ICD-10-CM | POA: Diagnosis not present

## 2023-02-07 DIAGNOSIS — A419 Sepsis, unspecified organism: Secondary | ICD-10-CM | POA: Diagnosis not present

## 2023-02-07 DIAGNOSIS — L89326 Pressure-induced deep tissue damage of left buttock: Secondary | ICD-10-CM | POA: Diagnosis not present

## 2023-02-07 DIAGNOSIS — L8946 Pressure-induced deep tissue damage of contiguous site of back, buttock and hip: Secondary | ICD-10-CM | POA: Insufficient documentation

## 2023-02-11 ENCOUNTER — Telehealth: Payer: Self-pay | Admitting: Family Medicine

## 2023-02-11 NOTE — Telephone Encounter (Signed)
Spoke with patient's daughter by phone.  She was present during initial visit and wanted a copy of the summary recommendations for pt.  She states that the facility has encouraged her to check on other options for care so that when pt is re-evaluated between days 14-20 if he gets denied additional skilled days.  She also indicated that the patient is making very slow progress.  He has been up at the bedside, to a WC once which he didn't tolerate well due to pain.  He is eating about 2 meals daily but has to be awake and cued to eat/assisted. Advised patient that she needs to talk to the business office about having Hospice services but as long as he qualifies for skilled rehab days that she should take advantage since Medicare if the patient/family elect Hospice will cover the Hospice costs but the family will pay room and board.  She states their goals are still to try and get him better and more independent.  Her mother (pt's wife and primary caregiver) still is not back at full capacity due to recent covid infection and subsequent pneumonia.  She states if they took him back to ILF at [facility] they would have to have 24/7 caregivers.  She has spoken with [facility] and they do have a semiprivate long term bed available if the family chooses to place him.  Advised her to check with the business office to see if he could have Hospice at that point as this is likely an assisted level of care. Advised that with Hospice he would not qualify for full restorative PT but if some few visits were needed to help with transfers then this might be possible.  Advised he is not at a level of being hospice appropriate for hospice home at present as he is still doing too well to have a life expectancy of 2 weeks or less and no current symptom management needs. Advised her that notes should be available through My Chart but reviewed Palliative recommendations with her.  Joycelyn Man FNP-C

## 2023-02-14 DIAGNOSIS — A419 Sepsis, unspecified organism: Secondary | ICD-10-CM | POA: Diagnosis not present

## 2023-02-14 DIAGNOSIS — C61 Malignant neoplasm of prostate: Secondary | ICD-10-CM | POA: Diagnosis not present

## 2023-02-14 DIAGNOSIS — I10 Essential (primary) hypertension: Secondary | ICD-10-CM | POA: Diagnosis not present

## 2023-02-14 DIAGNOSIS — L89326 Pressure-induced deep tissue damage of left buttock: Secondary | ICD-10-CM | POA: Diagnosis not present

## 2023-02-15 ENCOUNTER — Telehealth: Payer: Self-pay

## 2023-02-15 DIAGNOSIS — M069 Rheumatoid arthritis, unspecified: Secondary | ICD-10-CM | POA: Diagnosis not present

## 2023-02-15 DIAGNOSIS — K8020 Calculus of gallbladder without cholecystitis without obstruction: Secondary | ICD-10-CM | POA: Diagnosis not present

## 2023-02-15 DIAGNOSIS — I2510 Atherosclerotic heart disease of native coronary artery without angina pectoris: Secondary | ICD-10-CM | POA: Diagnosis not present

## 2023-02-15 DIAGNOSIS — U071 COVID-19: Secondary | ICD-10-CM | POA: Diagnosis not present

## 2023-02-15 DIAGNOSIS — G309 Alzheimer's disease, unspecified: Secondary | ICD-10-CM | POA: Diagnosis not present

## 2023-02-15 DIAGNOSIS — E039 Hypothyroidism, unspecified: Secondary | ICD-10-CM | POA: Diagnosis not present

## 2023-02-15 DIAGNOSIS — F02818 Dementia in other diseases classified elsewhere, unspecified severity, with other behavioral disturbance: Secondary | ICD-10-CM | POA: Diagnosis not present

## 2023-02-15 DIAGNOSIS — J449 Chronic obstructive pulmonary disease, unspecified: Secondary | ICD-10-CM | POA: Diagnosis not present

## 2023-02-15 DIAGNOSIS — G20A1 Parkinson's disease without dyskinesia, without mention of fluctuations: Secondary | ICD-10-CM | POA: Diagnosis not present

## 2023-02-15 DIAGNOSIS — I129 Hypertensive chronic kidney disease with stage 1 through stage 4 chronic kidney disease, or unspecified chronic kidney disease: Secondary | ICD-10-CM | POA: Diagnosis not present

## 2023-02-15 DIAGNOSIS — E785 Hyperlipidemia, unspecified: Secondary | ICD-10-CM | POA: Diagnosis not present

## 2023-02-15 DIAGNOSIS — J302 Other seasonal allergic rhinitis: Secondary | ICD-10-CM | POA: Diagnosis not present

## 2023-02-15 DIAGNOSIS — I714 Abdominal aortic aneurysm, without rupture, unspecified: Secondary | ICD-10-CM | POA: Diagnosis not present

## 2023-02-15 DIAGNOSIS — N183 Chronic kidney disease, stage 3 unspecified: Secondary | ICD-10-CM | POA: Diagnosis not present

## 2023-02-15 DIAGNOSIS — K219 Gastro-esophageal reflux disease without esophagitis: Secondary | ICD-10-CM | POA: Diagnosis not present

## 2023-02-15 NOTE — Congregational Nurse Program (Signed)
Spoke to patient's daughter (Susan Fournier) on 02/14/23 to asst with questions for her father's current health situation at [facility]. Daughter stated that she has been working with palliative care and social worker for pt's care and will have an evaluation for possible hospice care today. Will follow-up with daughter to offer support and resources.   Eyoel Throgmorton, BSN RN Congregational Nurse [employer] 

## 2023-02-16 DIAGNOSIS — U071 COVID-19: Secondary | ICD-10-CM | POA: Diagnosis not present

## 2023-02-16 DIAGNOSIS — F02818 Dementia in other diseases classified elsewhere, unspecified severity, with other behavioral disturbance: Secondary | ICD-10-CM | POA: Diagnosis not present

## 2023-02-16 DIAGNOSIS — G309 Alzheimer's disease, unspecified: Secondary | ICD-10-CM | POA: Diagnosis not present

## 2023-02-16 DIAGNOSIS — N183 Chronic kidney disease, stage 3 unspecified: Secondary | ICD-10-CM | POA: Diagnosis not present

## 2023-02-16 DIAGNOSIS — I129 Hypertensive chronic kidney disease with stage 1 through stage 4 chronic kidney disease, or unspecified chronic kidney disease: Secondary | ICD-10-CM | POA: Diagnosis not present

## 2023-02-16 DIAGNOSIS — K8020 Calculus of gallbladder without cholecystitis without obstruction: Secondary | ICD-10-CM | POA: Diagnosis not present

## 2023-02-18 DIAGNOSIS — N183 Chronic kidney disease, stage 3 unspecified: Secondary | ICD-10-CM | POA: Diagnosis not present

## 2023-02-18 DIAGNOSIS — K8020 Calculus of gallbladder without cholecystitis without obstruction: Secondary | ICD-10-CM | POA: Diagnosis not present

## 2023-02-18 DIAGNOSIS — I129 Hypertensive chronic kidney disease with stage 1 through stage 4 chronic kidney disease, or unspecified chronic kidney disease: Secondary | ICD-10-CM | POA: Diagnosis not present

## 2023-02-18 DIAGNOSIS — F02818 Dementia in other diseases classified elsewhere, unspecified severity, with other behavioral disturbance: Secondary | ICD-10-CM | POA: Diagnosis not present

## 2023-02-18 DIAGNOSIS — U071 COVID-19: Secondary | ICD-10-CM | POA: Diagnosis not present

## 2023-02-18 DIAGNOSIS — G309 Alzheimer's disease, unspecified: Secondary | ICD-10-CM | POA: Diagnosis not present

## 2023-02-19 DIAGNOSIS — N183 Chronic kidney disease, stage 3 unspecified: Secondary | ICD-10-CM | POA: Diagnosis not present

## 2023-02-19 DIAGNOSIS — I129 Hypertensive chronic kidney disease with stage 1 through stage 4 chronic kidney disease, or unspecified chronic kidney disease: Secondary | ICD-10-CM | POA: Diagnosis not present

## 2023-02-19 DIAGNOSIS — F02818 Dementia in other diseases classified elsewhere, unspecified severity, with other behavioral disturbance: Secondary | ICD-10-CM | POA: Diagnosis not present

## 2023-02-19 DIAGNOSIS — K8020 Calculus of gallbladder without cholecystitis without obstruction: Secondary | ICD-10-CM | POA: Diagnosis not present

## 2023-02-19 DIAGNOSIS — G309 Alzheimer's disease, unspecified: Secondary | ICD-10-CM | POA: Diagnosis not present

## 2023-02-19 DIAGNOSIS — U071 COVID-19: Secondary | ICD-10-CM | POA: Diagnosis not present

## 2023-02-19 NOTE — Progress Notes (Signed)
This encounter was created in error - please disregard.

## 2023-02-19 NOTE — Telephone Encounter (Signed)
Opened in error

## 2023-02-19 NOTE — Addendum Note (Signed)
Addended by: Narda Rutherford on: 02/19/2023 04:50 PM   Modules accepted: Orders, Level of Service

## 2023-02-19 NOTE — Telephone Encounter (Signed)
Spoke to patient's daughter Rhett Bannister) on 02/14/23 to asst with questions for her father's current health situation at [facility]. Daughter stated that she has been working with palliative care and social worker for pt's care and will have an evaluation for possible hospice care today. Will follow-up with daughter to offer support and resources.   Damian Leavell, BSN RN Congregational Nurse [employer]

## 2023-02-19 NOTE — Congregational Nurse Program (Signed)
Opened in error   Damian Leavell, BSN RN Congregational Nurse [employer]

## 2023-02-20 DIAGNOSIS — U071 COVID-19: Secondary | ICD-10-CM | POA: Diagnosis not present

## 2023-02-20 DIAGNOSIS — G309 Alzheimer's disease, unspecified: Secondary | ICD-10-CM | POA: Diagnosis not present

## 2023-02-20 DIAGNOSIS — I129 Hypertensive chronic kidney disease with stage 1 through stage 4 chronic kidney disease, or unspecified chronic kidney disease: Secondary | ICD-10-CM | POA: Diagnosis not present

## 2023-02-20 DIAGNOSIS — K8020 Calculus of gallbladder without cholecystitis without obstruction: Secondary | ICD-10-CM | POA: Diagnosis not present

## 2023-02-20 DIAGNOSIS — F02818 Dementia in other diseases classified elsewhere, unspecified severity, with other behavioral disturbance: Secondary | ICD-10-CM | POA: Diagnosis not present

## 2023-02-20 DIAGNOSIS — N183 Chronic kidney disease, stage 3 unspecified: Secondary | ICD-10-CM | POA: Diagnosis not present

## 2023-02-21 ENCOUNTER — Other Ambulatory Visit: Payer: Self-pay | Admitting: *Deleted

## 2023-02-21 DIAGNOSIS — A419 Sepsis, unspecified organism: Secondary | ICD-10-CM | POA: Diagnosis not present

## 2023-02-21 DIAGNOSIS — I129 Hypertensive chronic kidney disease with stage 1 through stage 4 chronic kidney disease, or unspecified chronic kidney disease: Secondary | ICD-10-CM | POA: Diagnosis not present

## 2023-02-21 DIAGNOSIS — F02818 Dementia in other diseases classified elsewhere, unspecified severity, with other behavioral disturbance: Secondary | ICD-10-CM | POA: Diagnosis not present

## 2023-02-21 DIAGNOSIS — G309 Alzheimer's disease, unspecified: Secondary | ICD-10-CM | POA: Diagnosis not present

## 2023-02-21 DIAGNOSIS — K8020 Calculus of gallbladder without cholecystitis without obstruction: Secondary | ICD-10-CM | POA: Diagnosis not present

## 2023-02-21 DIAGNOSIS — I10 Essential (primary) hypertension: Secondary | ICD-10-CM | POA: Diagnosis not present

## 2023-02-21 DIAGNOSIS — U071 COVID-19: Secondary | ICD-10-CM | POA: Diagnosis not present

## 2023-02-21 DIAGNOSIS — C61 Malignant neoplasm of prostate: Secondary | ICD-10-CM | POA: Diagnosis not present

## 2023-02-21 DIAGNOSIS — L89326 Pressure-induced deep tissue damage of left buttock: Secondary | ICD-10-CM | POA: Diagnosis not present

## 2023-02-21 DIAGNOSIS — N183 Chronic kidney disease, stage 3 unspecified: Secondary | ICD-10-CM | POA: Diagnosis not present

## 2023-02-21 NOTE — Patient Outreach (Addendum)
Per [facility] Devin Vance resides in [facility] skilled nursing facility. Screening for potential care coordination services as benefit of health plan and Primary Care Provider.   Facilty site visit to [facility]. Made aware by [employer], Devin Vance has transitioned to long term care with hospice services.   No identifiable care coordination needs.   Devin Noble, MSN, RN,BSN [facility] Coordinator [phone] (Direct dial)

## 2023-02-25 DIAGNOSIS — F02818 Dementia in other diseases classified elsewhere, unspecified severity, with other behavioral disturbance: Secondary | ICD-10-CM | POA: Diagnosis not present

## 2023-02-25 DIAGNOSIS — G309 Alzheimer's disease, unspecified: Secondary | ICD-10-CM | POA: Diagnosis not present

## 2023-02-25 DIAGNOSIS — N183 Chronic kidney disease, stage 3 unspecified: Secondary | ICD-10-CM | POA: Diagnosis not present

## 2023-02-25 DIAGNOSIS — U071 COVID-19: Secondary | ICD-10-CM | POA: Diagnosis not present

## 2023-02-25 DIAGNOSIS — K8020 Calculus of gallbladder without cholecystitis without obstruction: Secondary | ICD-10-CM | POA: Diagnosis not present

## 2023-02-25 DIAGNOSIS — I129 Hypertensive chronic kidney disease with stage 1 through stage 4 chronic kidney disease, or unspecified chronic kidney disease: Secondary | ICD-10-CM | POA: Diagnosis not present

## 2023-02-26 DIAGNOSIS — R400 Somnolence: Secondary | ICD-10-CM

## 2023-02-26 DIAGNOSIS — G309 Alzheimer's disease, unspecified: Secondary | ICD-10-CM | POA: Diagnosis not present

## 2023-02-26 DIAGNOSIS — K8000 Calculus of gallbladder with acute cholecystitis without obstruction: Secondary | ICD-10-CM

## 2023-02-26 DIAGNOSIS — J449 Chronic obstructive pulmonary disease, unspecified: Secondary | ICD-10-CM

## 2023-02-26 DIAGNOSIS — I2510 Atherosclerotic heart disease of native coronary artery without angina pectoris: Secondary | ICD-10-CM

## 2023-02-26 DIAGNOSIS — R1311 Dysphagia, oral phase: Secondary | ICD-10-CM

## 2023-02-26 DIAGNOSIS — K8020 Calculus of gallbladder without cholecystitis without obstruction: Secondary | ICD-10-CM | POA: Diagnosis not present

## 2023-02-26 DIAGNOSIS — N183 Chronic kidney disease, stage 3 unspecified: Secondary | ICD-10-CM | POA: Diagnosis not present

## 2023-02-26 DIAGNOSIS — F03918 Unspecified dementia, unspecified severity, with other behavioral disturbance: Secondary | ICD-10-CM

## 2023-02-26 DIAGNOSIS — F02818 Dementia in other diseases classified elsewhere, unspecified severity, with other behavioral disturbance: Secondary | ICD-10-CM | POA: Diagnosis not present

## 2023-02-26 DIAGNOSIS — U071 COVID-19: Secondary | ICD-10-CM | POA: Diagnosis not present

## 2023-02-26 DIAGNOSIS — I129 Hypertensive chronic kidney disease with stage 1 through stage 4 chronic kidney disease, or unspecified chronic kidney disease: Secondary | ICD-10-CM | POA: Diagnosis not present

## 2023-02-26 DIAGNOSIS — E119 Type 2 diabetes mellitus without complications: Secondary | ICD-10-CM | POA: Diagnosis not present

## 2023-02-27 ENCOUNTER — Inpatient Hospital Stay (HOSPITAL_COMMUNITY): Admit: 2023-02-27 | Payer: Medicare Other

## 2023-02-27 DIAGNOSIS — Z710 Person encountering health services to consult on behalf of another person: Secondary | ICD-10-CM

## 2023-02-27 DIAGNOSIS — G309 Alzheimer's disease, unspecified: Secondary | ICD-10-CM | POA: Diagnosis not present

## 2023-02-27 DIAGNOSIS — I129 Hypertensive chronic kidney disease with stage 1 through stage 4 chronic kidney disease, or unspecified chronic kidney disease: Secondary | ICD-10-CM | POA: Diagnosis not present

## 2023-02-27 DIAGNOSIS — Z7689 Persons encountering health services in other specified circumstances: Secondary | ICD-10-CM

## 2023-02-27 DIAGNOSIS — F02818 Dementia in other diseases classified elsewhere, unspecified severity, with other behavioral disturbance: Secondary | ICD-10-CM | POA: Diagnosis not present

## 2023-02-27 DIAGNOSIS — K8020 Calculus of gallbladder without cholecystitis without obstruction: Secondary | ICD-10-CM | POA: Diagnosis not present

## 2023-02-27 DIAGNOSIS — U071 COVID-19: Secondary | ICD-10-CM | POA: Diagnosis not present

## 2023-02-27 DIAGNOSIS — N183 Chronic kidney disease, stage 3 unspecified: Secondary | ICD-10-CM | POA: Diagnosis not present

## 2023-02-28 DIAGNOSIS — I129 Hypertensive chronic kidney disease with stage 1 through stage 4 chronic kidney disease, or unspecified chronic kidney disease: Secondary | ICD-10-CM | POA: Diagnosis not present

## 2023-02-28 DIAGNOSIS — N183 Chronic kidney disease, stage 3 unspecified: Secondary | ICD-10-CM | POA: Diagnosis not present

## 2023-02-28 DIAGNOSIS — F02818 Dementia in other diseases classified elsewhere, unspecified severity, with other behavioral disturbance: Secondary | ICD-10-CM | POA: Diagnosis not present

## 2023-02-28 DIAGNOSIS — U071 COVID-19: Secondary | ICD-10-CM | POA: Diagnosis not present

## 2023-02-28 DIAGNOSIS — K8020 Calculus of gallbladder without cholecystitis without obstruction: Secondary | ICD-10-CM | POA: Diagnosis not present

## 2023-02-28 DIAGNOSIS — G309 Alzheimer's disease, unspecified: Secondary | ICD-10-CM | POA: Diagnosis not present

## 2023-03-16 DEATH — deceased
